# Patient Record
Sex: Female | Born: 1975
Health system: Southern US, Community
[De-identification: ages and names within clinical notes are randomized; demographics above are authoritative.]

## PROBLEM LIST (undated history)

## (undated) DIAGNOSIS — I890 Lymphedema, not elsewhere classified: Secondary | ICD-10-CM

## (undated) DIAGNOSIS — I10 Essential (primary) hypertension: Secondary | ICD-10-CM

## (undated) DIAGNOSIS — G43909 Migraine, unspecified, not intractable, without status migrainosus: Secondary | ICD-10-CM

## (undated) DIAGNOSIS — I82409 Acute embolism and thrombosis of unspecified deep veins of unspecified lower extremity: Secondary | ICD-10-CM

## (undated) DIAGNOSIS — T7840XA Allergy, unspecified, initial encounter: Secondary | ICD-10-CM

## (undated) DIAGNOSIS — K219 Gastro-esophageal reflux disease without esophagitis: Secondary | ICD-10-CM

## (undated) DIAGNOSIS — K7689 Other specified diseases of liver: Secondary | ICD-10-CM

## (undated) DIAGNOSIS — I2699 Other pulmonary embolism without acute cor pulmonale: Secondary | ICD-10-CM

## (undated) DIAGNOSIS — F419 Anxiety disorder, unspecified: Secondary | ICD-10-CM

## (undated) DIAGNOSIS — G4730 Sleep apnea, unspecified: Secondary | ICD-10-CM

## (undated) DIAGNOSIS — Z6841 Body Mass Index (BMI) 40.0 and over, adult: Secondary | ICD-10-CM

## (undated) DIAGNOSIS — F32A Depression, unspecified: Secondary | ICD-10-CM

## (undated) DIAGNOSIS — F329 Major depressive disorder, single episode, unspecified: Secondary | ICD-10-CM

## (undated) HISTORY — DX: Migraine, unspecified, not intractable, without status migrainosus: G43.909

## (undated) HISTORY — DX: Major depressive disorder, single episode, unspecified: F32.9

## (undated) HISTORY — DX: Allergy, unspecified, initial encounter: T78.40XA

## (undated) HISTORY — DX: Gastro-esophageal reflux disease without esophagitis: K21.9

## (undated) HISTORY — DX: Sleep apnea, unspecified: G47.30

## (undated) HISTORY — DX: Essential (primary) hypertension: I10

## (undated) HISTORY — DX: Anxiety disorder, unspecified: F41.9

## (undated) HISTORY — PX: CHOLECYSTECTOMY: SHX55

## (undated) HISTORY — DX: Depression, unspecified: F32.A

---

## 1998-08-08 ENCOUNTER — Encounter (HOSPITAL_COMMUNITY): Admission: RE | Admit: 1998-08-08 | Discharge: 1998-09-08 | Payer: Self-pay | Admitting: Obstetrics and Gynecology

## 1998-08-10 ENCOUNTER — Ambulatory Visit (HOSPITAL_COMMUNITY): Admission: RE | Admit: 1998-08-10 | Discharge: 1998-08-10 | Payer: Self-pay | Admitting: Obstetrics and Gynecology

## 1998-08-11 ENCOUNTER — Encounter: Payer: Self-pay | Admitting: Obstetrics and Gynecology

## 1998-08-15 ENCOUNTER — Ambulatory Visit (HOSPITAL_COMMUNITY): Admission: RE | Admit: 1998-08-15 | Discharge: 1998-08-15 | Payer: Self-pay | Admitting: Obstetrics and Gynecology

## 1998-09-06 ENCOUNTER — Inpatient Hospital Stay (HOSPITAL_COMMUNITY): Admission: AD | Admit: 1998-09-06 | Discharge: 1998-09-09 | Payer: Self-pay | Admitting: Obstetrics and Gynecology

## 1998-10-16 ENCOUNTER — Emergency Department (HOSPITAL_COMMUNITY): Admission: EM | Admit: 1998-10-16 | Discharge: 1998-10-16 | Payer: Self-pay | Admitting: Emergency Medicine

## 1998-11-26 ENCOUNTER — Inpatient Hospital Stay (HOSPITAL_COMMUNITY): Admission: EM | Admit: 1998-11-26 | Discharge: 1998-11-28 | Payer: Self-pay | Admitting: Emergency Medicine

## 1998-11-26 ENCOUNTER — Encounter: Payer: Self-pay | Admitting: Emergency Medicine

## 2000-05-15 ENCOUNTER — Other Ambulatory Visit: Admission: RE | Admit: 2000-05-15 | Discharge: 2000-05-15 | Payer: Self-pay | Admitting: Obstetrics and Gynecology

## 2001-07-17 ENCOUNTER — Other Ambulatory Visit: Admission: RE | Admit: 2001-07-17 | Discharge: 2001-07-17 | Payer: Self-pay | Admitting: Obstetrics and Gynecology

## 2002-07-22 ENCOUNTER — Other Ambulatory Visit: Admission: RE | Admit: 2002-07-22 | Discharge: 2002-07-22 | Payer: Self-pay | Admitting: Obstetrics and Gynecology

## 2012-11-23 LAB — HM PAP SMEAR: HM Pap smear: NEGATIVE

## 2013-01-21 ENCOUNTER — Encounter: Payer: Self-pay | Admitting: Obstetrics & Gynecology

## 2013-02-10 ENCOUNTER — Encounter: Payer: PRIVATE HEALTH INSURANCE | Admitting: Obstetrics & Gynecology

## 2013-02-24 ENCOUNTER — Encounter: Payer: PRIVATE HEALTH INSURANCE | Admitting: Obstetrics & Gynecology

## 2013-03-10 ENCOUNTER — Ambulatory Visit (INDEPENDENT_AMBULATORY_CARE_PROVIDER_SITE_OTHER): Payer: BC Managed Care – PPO | Admitting: Obstetrics & Gynecology

## 2013-03-10 ENCOUNTER — Encounter: Payer: Self-pay | Admitting: Obstetrics & Gynecology

## 2013-03-10 VITALS — BP 146/86 | HR 93 | Temp 97.1°F | Ht 64.0 in | Wt 369.2 lb

## 2013-03-10 DIAGNOSIS — Z975 Presence of (intrauterine) contraceptive device: Secondary | ICD-10-CM | POA: Insufficient documentation

## 2013-03-10 LAB — POCT PREGNANCY, URINE: Preg Test, Ur: NEGATIVE

## 2013-03-10 NOTE — Progress Notes (Signed)
Patient desires paragard removal and insertion; paragard expired in November 2013. Has had unprotected sex in last 2 weeks

## 2013-03-10 NOTE — Patient Instructions (Addendum)
Levonorgestrel intrauterine device (IUD) What is this medicine? LEVONORGESTREL IUD (LEE voe nor jes trel) is a contraceptive (birth control) device. The device is placed inside the uterus by a healthcare professional. It is used to prevent pregnancy and can also be used to treat heavy bleeding that occurs during your period. Depending on the device, it can be used for 3 to 5 years. This medicine may be used for other purposes; ask your health care provider or pharmacist if you have questions. What should I tell my health care provider before I take this medicine? They need to know if you have any of these conditions: -abnormal Pap smear -cancer of the breast, uterus, or cervix -diabetes -endometritis -genital or pelvic infection now or in the past -have more than one sexual partner or your partner has more than one partner -heart disease -history of an ectopic or tubal pregnancy -immune system problems -IUD in place -liver disease or tumor -problems with blood clots or take blood-thinners -use intravenous drugs -uterus of unusual shape -vaginal bleeding that has not been explained -an unusual or allergic reaction to levonorgestrel, other hormones, silicone, or polyethylene, medicines, foods, dyes, or preservatives -pregnant or trying to get pregnant -breast-feeding How should I use this medicine? This device is placed inside the uterus by a health care professional. Talk to your pediatrician regarding the use of this medicine in children. Special care may be needed. Overdosage: If you think you have taken too much of this medicine contact a poison control center or emergency room at once. NOTE: This medicine is only for you. Do not share this medicine with others. What if I miss a dose? This does not apply. What may interact with this medicine? Do not take this medicine with any of the following medications: -amprenavir -bosentan -fosamprenavir This medicine may also interact with  the following medications: -aprepitant -barbiturate medicines for inducing sleep or treating seizures -bexarotene -griseofulvin -medicines to treat seizures like carbamazepine, ethotoin, felbamate, oxcarbazepine, phenytoin, topiramate -modafinil -pioglitazone -rifabutin -rifampin -rifapentine -some medicines to treat HIV infection like atazanavir, indinavir, lopinavir, nelfinavir, tipranavir, ritonavir -St. John's wort -warfarin This list may not describe all possible interactions. Give your health care provider a list of all the medicines, herbs, non-prescription drugs, or dietary supplements you use. Also tell them if you smoke, drink alcohol, or use illegal drugs. Some items may interact with your medicine. What should I watch for while using this medicine? Visit your doctor or health care professional for regular check ups. See your doctor if you or your partner has sexual contact with others, becomes HIV positive, or gets a sexual transmitted disease. This product does not protect you against HIV infection (AIDS) or other sexually transmitted diseases. You can check the placement of the IUD yourself by reaching up to the top of your vagina with clean fingers to feel the threads. Do not pull on the threads. It is a good habit to check placement after each menstrual period. Call your doctor right away if you feel more of the IUD than just the threads or if you cannot feel the threads at all. The IUD may come out by itself. You may become pregnant if the device comes out. If you notice that the IUD has come out use a backup birth control method like condoms and call your health care provider. Using tampons will not change the position of the IUD and are okay to use during your period. What side effects may I notice from receiving this medicine?   Side effects that you should report to your doctor or health care professional as soon as possible: -allergic reactions like skin rash, itching or  hives, swelling of the face, lips, or tongue -fever, flu-like symptoms -genital sores -high blood pressure -no menstrual period for 6 weeks during use -pain, swelling, warmth in the leg -pelvic pain or tenderness -severe or sudden headache -signs of pregnancy -stomach cramping -sudden shortness of breath -trouble with balance, talking, or walking -unusual vaginal bleeding, discharge -yellowing of the eyes or skin Side effects that usually do not require medical attention (report to your doctor or health care professional if they continue or are bothersome): -acne -breast pain -change in sex drive or performance -changes in weight -cramping, dizziness, or faintness while the device is being inserted -headache -irregular menstrual bleeding within first 3 to 6 months of use -nausea This list may not describe all possible side effects. Call your doctor for medical advice about side effects. You may report side effects to FDA at 1-800-FDA-1088. Where should I keep my medicine? This does not apply. NOTE: This sheet is a summary. It may not cover all possible information. If you have questions about this medicine, talk to your doctor, pharmacist, or health care provider.  2013, [employer]. (01/09/2012 1:54:04 PM)  

## 2013-03-10 NOTE — Progress Notes (Signed)
Patient ID: Susan Warren, female   DOB: 21-Oct-1976, 37 y.o.   MRN: [id number]  Chief Complaint  Patient presents with  . IUD removal  . IUD insertion    HPI Susan Warren is a 37 y.o. female.  Paragard placed 2003, in amenorrheic. Wants removal and reinsertion.Was denied at [facility] due to no insurance.  HPI  Past Medical History  Diagnosis Date  . Hypertension     Past Surgical History  Procedure Laterality Date  . Cholecystectomy      Family History  Problem Relation Age of Onset  . Hypertension Maternal Grandmother   . Diabetes Maternal Grandmother   . Cancer Maternal Grandmother     Social History History  Substance Use Topics  . Smoking status: Never Smoker   . Smokeless tobacco: Never Used  . Alcohol Use: No    No Known Allergies  Current Outpatient Prescriptions  Medication Sig Dispense Refill  . buPROPion (WELLBUTRIN) 100 MG tablet Take 100 mg by mouth 2 (two) times daily.      . folic acid (FOLVITE) 800 MCG tablet Take 400 mcg by mouth daily.      Marland Kitchen lisinopril-hydrochlorothiazide (PRINZIDE,ZESTORETIC) 20-25 MG per tablet Take 1 tablet by mouth daily.       No current facility-administered medications for this visit.    Review of Systems Review of Systems  Genitourinary: Negative for vaginal discharge, menstrual problem (amenorrhea) and pelvic pain.    Blood pressure 146/86, pulse 93, temperature 97.1 F (36.2 C), temperature source Oral, height 5\' 4"  (1.626 m), weight 369 lb 3.2 oz (167.468 kg).  Physical Exam Physical Exam  Data Reviewed notes  Assessment    Needs IUD     Plan    Apply for Mirena   Hally Colella 03/10/2013         Tysheem Accardo 03/10/2013, 5:11 PM

## 2013-11-29 ENCOUNTER — Encounter: Payer: Self-pay | Admitting: Nurse Practitioner

## 2013-11-30 ENCOUNTER — Ambulatory Visit: Payer: Self-pay | Admitting: Nurse Practitioner

## 2013-11-30 ENCOUNTER — Encounter: Payer: Self-pay | Admitting: Nurse Practitioner

## 2014-01-25 ENCOUNTER — Ambulatory Visit (INDEPENDENT_AMBULATORY_CARE_PROVIDER_SITE_OTHER): Payer: BC Managed Care – PPO | Admitting: Internal Medicine

## 2014-01-25 ENCOUNTER — Encounter: Payer: Self-pay | Admitting: Internal Medicine

## 2014-01-25 ENCOUNTER — Telehealth: Payer: Self-pay | Admitting: Internal Medicine

## 2014-01-25 ENCOUNTER — Other Ambulatory Visit (INDEPENDENT_AMBULATORY_CARE_PROVIDER_SITE_OTHER): Payer: BC Managed Care – PPO

## 2014-01-25 VITALS — BP 118/80 | HR 89 | Temp 97.4°F | Ht 64.0 in | Wt 388.1 lb

## 2014-01-25 DIAGNOSIS — Z975 Presence of (intrauterine) contraceptive device: Secondary | ICD-10-CM

## 2014-01-25 DIAGNOSIS — F411 Generalized anxiety disorder: Secondary | ICD-10-CM

## 2014-01-25 DIAGNOSIS — Z0000 Encounter for general adult medical examination without abnormal findings: Secondary | ICD-10-CM

## 2014-01-25 DIAGNOSIS — T7840XA Allergy, unspecified, initial encounter: Secondary | ICD-10-CM | POA: Insufficient documentation

## 2014-01-25 DIAGNOSIS — F32A Depression, unspecified: Secondary | ICD-10-CM

## 2014-01-25 DIAGNOSIS — I10 Essential (primary) hypertension: Secondary | ICD-10-CM | POA: Insufficient documentation

## 2014-01-25 DIAGNOSIS — K219 Gastro-esophageal reflux disease without esophagitis: Secondary | ICD-10-CM | POA: Insufficient documentation

## 2014-01-25 DIAGNOSIS — G43909 Migraine, unspecified, not intractable, without status migrainosus: Secondary | ICD-10-CM | POA: Insufficient documentation

## 2014-01-25 DIAGNOSIS — F419 Anxiety disorder, unspecified: Secondary | ICD-10-CM | POA: Insufficient documentation

## 2014-01-25 DIAGNOSIS — F3289 Other specified depressive episodes: Secondary | ICD-10-CM

## 2014-01-25 DIAGNOSIS — F329 Major depressive disorder, single episode, unspecified: Secondary | ICD-10-CM

## 2014-01-25 LAB — TSH: TSH: 1.71 u[IU]/mL (ref 0.35–5.50)

## 2014-01-25 LAB — URINALYSIS, ROUTINE W REFLEX MICROSCOPIC
Bilirubin Urine: NEGATIVE
KETONES UR: NEGATIVE
NITRITE: NEGATIVE
PH: 5.5 (ref 5.0–8.0)
SPECIFIC GRAVITY, URINE: 1.025 (ref 1.000–1.030)
Total Protein, Urine: NEGATIVE
UROBILINOGEN UA: 0.2 (ref 0.0–1.0)
Urine Glucose: NEGATIVE

## 2014-01-25 LAB — LIPID PANEL
Cholesterol: 233 mg/dL — ABNORMAL HIGH (ref 0–200)
HDL: 44.2 mg/dL (ref >39.00)
TRIGLYCERIDES: 113 mg/dL (ref 0.0–149.0)
Total CHOL/HDL Ratio: 5
VLDL: 22.6 mg/dL (ref 0.0–40.0)

## 2014-01-25 LAB — BASIC METABOLIC PANEL
BUN: 13 mg/dL (ref 6–23)
CO2: 27 meq/L (ref 19–32)
Calcium: 9.8 mg/dL (ref 8.4–10.5)
Chloride: 102 mEq/L (ref 96–112)
Creatinine, Ser: 0.8 mg/dL (ref 0.4–1.2)
GFR: 84.43 mL/min (ref >60.00)
GLUCOSE: 84 mg/dL (ref 70–99)
POTASSIUM: 4.4 meq/L (ref 3.5–5.1)
SODIUM: 138 meq/L (ref 135–145)

## 2014-01-25 LAB — HEPATIC FUNCTION PANEL
ALK PHOS: 70 U/L (ref 39–117)
ALT: 21 U/L (ref 0–35)
AST: 22 U/L (ref 0–37)
Albumin: 3.8 g/dL (ref 3.5–5.2)
Bilirubin, Direct: 0.2 mg/dL (ref 0.0–0.3)
TOTAL PROTEIN: 7.9 g/dL (ref 6.0–8.3)
Total Bilirubin: 1.3 mg/dL — ABNORMAL HIGH (ref 0.3–1.2)

## 2014-01-25 LAB — CBC WITH DIFFERENTIAL/PLATELET
BASOS ABS: 0.1 10*3/uL (ref 0.0–0.1)
Basophils Relative: 0.6 % (ref 0.0–3.0)
EOS PCT: 1.8 % (ref 0.0–5.0)
Eosinophils Absolute: 0.2 10*3/uL (ref 0.0–0.7)
HEMATOCRIT: 42.5 % (ref 36.0–46.0)
Hemoglobin: 14.2 g/dL (ref 12.0–15.0)
LYMPHS ABS: 2.5 10*3/uL (ref 0.7–4.0)
LYMPHS PCT: 23.2 % (ref 12.0–46.0)
MCHC: 33.4 g/dL (ref 30.0–36.0)
MCV: 89.6 fl (ref 78.0–100.0)
Monocytes Absolute: 0.6 10*3/uL (ref 0.1–1.0)
Monocytes Relative: 5.4 % (ref 3.0–12.0)
NEUTROS PCT: 69 % (ref 43.0–77.0)
Neutro Abs: 7.4 10*3/uL (ref 1.4–7.7)
PLATELETS: 332 10*3/uL (ref 150.0–400.0)
RBC: 4.74 Mil/uL (ref 3.87–5.11)
RDW: 13.6 % (ref 11.5–14.6)
WBC: 10.7 10*3/uL — AB (ref 4.5–10.5)

## 2014-01-25 LAB — LDL CHOLESTEROL, DIRECT: Direct LDL: 177.9 mg/dL

## 2014-01-25 MED ORDER — ESCITALOPRAM OXALATE 10 MG PO TABS: 10.0000 mg | ORAL_TABLET | Freq: Every day | ORAL | Status: DC

## 2014-01-25 MED ORDER — LISINOPRIL-HYDROCHLOROTHIAZIDE 20-25 MG PO TABS: 1.0000 | ORAL_TABLET | Freq: Every day | ORAL | Status: DC

## 2014-01-25 MED ORDER — CEPHALEXIN 500 MG PO CAPS: 500.0000 mg | ORAL_CAPSULE | Freq: Four times a day (QID) | ORAL | Status: DC

## 2014-01-25 NOTE — Telephone Encounter (Signed)
Patient informed. 

## 2014-01-25 NOTE — Telephone Encounter (Signed)
UA with labs today with pyuria, pt called per Robin/CMA - pt with UTI symptoms - for cephalexin course,  to f/u any worsening symptoms or concerns

## 2014-01-25 NOTE — Patient Instructions (Signed)
Please take all new medication as prescribed - the lexapro (generic) at 10 mg per day  Please call or return if not improved in 4 wks, to consider higher strength  You will be contacted regarding the referral for: counseling  Please continue all other medications as before, and refills have been done if requested. Please have the pharmacy call with any other refills you may need.  Please continue your efforts at being more active, low cholesterol diet, and weight control. You are otherwise up to date with prevention measures today.  Please go to the LAB in the Basement (turn left off the elevator) for the tests to be done today You will be contacted by phone if any changes need to be made immediately.  Otherwise, you will receive a letter about your results with an explanation, but please check with MyChart first.  Please remember to sign up for My Chart if you have not done so, as this will be important to you in the future with finding out test results, communicating by private email, and scheduling acute appointments online when needed.  Please return in 1 year for your yearly visit, or sooner if needed, with Lab testing done 3-5 days before

## 2014-01-25 NOTE — Assessment & Plan Note (Signed)
For lexapro 10 qd, refer counseling, f/u if not improved in 4 wks

## 2014-01-25 NOTE — Assessment & Plan Note (Signed)

## 2014-01-25 NOTE — Progress Notes (Signed)
Subjective:    Patient ID: Susan Warren, female    DOB: [birth year], 38 y.o.   MRN: [id number]  HPI  Here for wellness and f/u;  Overall doing ok;  Pt denies CP, worsening SOB, DOE, wheezing, orthopnea, PND, worsening LE edema, palpitations, dizziness or syncope.  Pt denies neurological change such as new headache, facial or extremity weakness.  Pt denies polydipsia, polyuria, or low sugar symptoms. Pt states overall good compliance with treatment and medications, good tolerability, and has been trying to follow lower cholesterol diet.  Pt denies worsening depressive symptoms, suicidal ideation or panic. No fever, night sweats, wt loss, loss of appetite, or other constitutional symptoms.  Pt states good ability with ADL's, has low fall risk, home safety reviewed and adequate, no other significant changes in hearing or vision, and only occasionally active with exercise, though plans to start going 3 times per wk to gym soon..  Has significant anxiety, wellbutrin did not help, has teen age son with daily stress with him and work;  Denies signifcant OSA symtpoms,  Mother has signficant thyroid abnormal fxn Past Medical History  Diagnosis Date  . Hypertension   . Depression   . GERD (gastroesophageal reflux disease)   . Allergy   . Migraines   . Anxiety    Past Surgical History  Procedure Laterality Date  . Cholecystectomy      reports that she has never smoked. She has never used smokeless tobacco. She reports that she does not drink alcohol or use illicit drugs. family history includes Breast cancer in her maternal grandmother; Cancer in her maternal grandmother; Diabetes in her maternal grandmother; Hypertension in her maternal grandmother and mother; Stroke in her maternal grandmother. No Known Allergies Current Outpatient Prescriptions on File Prior to Visit  Medication Sig Dispense Refill  . folic acid (FOLVITE) 800 MCG tablet Take 400 mcg by mouth daily.       No current  facility-administered medications on file prior to visit.   Review of Systems Constitutional: Negative for diaphoresis, activity change, appetite change or unexpected weight change.  HENT: Negative for hearing loss, ear pain, facial swelling, mouth sores and neck stiffness.   Eyes: Negative for pain, redness and visual disturbance.  Respiratory: Negative for shortness of breath and wheezing.   Cardiovascular: Negative for chest pain and palpitations.  Gastrointestinal: Negative for diarrhea, blood in stool, abdominal distention or other pain Genitourinary: Negative for hematuria, flank pain or change in urine volume.  Musculoskeletal: Negative for myalgias and joint swelling.  Skin: Negative for color change and wound.  Neurological: Negative for syncope and numbness. other than noted Hematological: Negative for adenopathy.  Psychiatric/Behavioral: Negative for hallucinations, self-injury, decreased concentration and agitation.      Objective:   Physical Exam BP 118/80  Pulse 89  Temp(Src) 97.4 F (36.3 C) (Oral)  Ht 5\' 4"  (1.626 m)  Wt 388 lb 2 oz (176.052 kg)  BMI 66.59 kg/m2  SpO2 97% VS noted,  Constitutional: Pt is oriented to person, place, and time. Appears well-developed and well-nourished. .morbid obese Head: Normocephalic and atraumatic.  Right Ear: External ear normal.  Left Ear: External ear normal.  Nose: Nose normal.  Mouth/Throat: Oropharynx is clear and moist.  Eyes: Conjunctivae and EOM are normal. Pupils are equal, round, and reactive to light.  Neck: Normal range of motion. Neck supple. No JVD present. No tracheal deviation present.  Cardiovascular: Normal rate, regular rhythm, normal heart sounds and intact distal pulses.   Pulmonary/Chest: Effort normal  and breath sounds normal.  Abdominal: Soft. Bowel sounds are normal. There is no tenderness. No HSM  Musculoskeletal: Normal range of motion. Exhibits no edema.  Lymphadenopathy:  Has no cervical  adenopathy.  Neurological: Pt is alert and oriented to person, place, and time. Pt has normal reflexes. No cranial nerve deficit.  Skin: Skin is warm and dry. No rash noted.  Psychiatric:  Has  1-2+ anxious mood and affect. Behavior is normal.     Assessment & Plan:

## 2014-10-24 ENCOUNTER — Encounter: Payer: Self-pay | Admitting: Internal Medicine

## 2015-01-31 ENCOUNTER — Encounter: Payer: BC Managed Care – PPO | Admitting: Internal Medicine

## 2015-02-06 ENCOUNTER — Other Ambulatory Visit: Payer: Self-pay | Admitting: Internal Medicine

## 2015-04-07 ENCOUNTER — Encounter (HOSPITAL_COMMUNITY): Payer: Self-pay | Admitting: *Deleted

## 2015-04-07 ENCOUNTER — Observation Stay (HOSPITAL_COMMUNITY)
Admission: EM | Admit: 2015-04-07 | Discharge: 2015-04-07 | Disposition: A | Discharge status: Home / Self Care | Payer: Medicaid Other | Attending: Internal Medicine | Admitting: Internal Medicine

## 2015-04-07 DIAGNOSIS — E669 Obesity, unspecified: Secondary | ICD-10-CM | POA: Diagnosis not present

## 2015-04-07 DIAGNOSIS — Z7982 Long term (current) use of aspirin: Secondary | ICD-10-CM | POA: Diagnosis not present

## 2015-04-07 DIAGNOSIS — I10 Essential (primary) hypertension: Secondary | ICD-10-CM | POA: Diagnosis not present

## 2015-04-07 DIAGNOSIS — Z9049 Acquired absence of other specified parts of digestive tract: Secondary | ICD-10-CM | POA: Insufficient documentation

## 2015-04-07 DIAGNOSIS — R600 Localized edema: Secondary | ICD-10-CM

## 2015-04-07 DIAGNOSIS — T783XXA Angioneurotic edema, initial encounter: Secondary | ICD-10-CM | POA: Diagnosis not present

## 2015-04-07 DIAGNOSIS — K219 Gastro-esophageal reflux disease without esophagitis: Secondary | ICD-10-CM | POA: Insufficient documentation

## 2015-04-07 DIAGNOSIS — F329 Major depressive disorder, single episode, unspecified: Secondary | ICD-10-CM | POA: Diagnosis not present

## 2015-04-07 DIAGNOSIS — T783XXS Angioneurotic edema, sequela: Secondary | ICD-10-CM

## 2015-04-07 DIAGNOSIS — Z6841 Body Mass Index (BMI) 40.0 and over, adult: Secondary | ICD-10-CM | POA: Insufficient documentation

## 2015-04-07 DIAGNOSIS — M7989 Other specified soft tissue disorders: Secondary | ICD-10-CM | POA: Diagnosis not present

## 2015-04-07 DIAGNOSIS — G43909 Migraine, unspecified, not intractable, without status migrainosus: Secondary | ICD-10-CM | POA: Insufficient documentation

## 2015-04-07 DIAGNOSIS — G4730 Sleep apnea, unspecified: Secondary | ICD-10-CM

## 2015-04-07 DIAGNOSIS — K148 Other diseases of tongue: Secondary | ICD-10-CM | POA: Diagnosis present

## 2015-04-07 DIAGNOSIS — F419 Anxiety disorder, unspecified: Secondary | ICD-10-CM | POA: Insufficient documentation

## 2015-04-07 LAB — CBC WITH DIFFERENTIAL/PLATELET
Basophils Absolute: 0 10*3/uL (ref 0.0–0.1)
Basophils Relative: 0 % (ref 0–1)
EOS ABS: 0.1 10*3/uL (ref 0.0–0.7)
Eosinophils Relative: 1 % (ref 0–5)
HEMATOCRIT: 41.3 % (ref 36.0–46.0)
HEMOGLOBIN: 14 g/dL (ref 12.0–15.0)
LYMPHS ABS: 1.4 10*3/uL (ref 0.7–4.0)
Lymphocytes Relative: 12 % (ref 12–46)
MCH: 30.4 pg (ref 26.0–34.0)
MCHC: 33.9 g/dL (ref 30.0–36.0)
MCV: 89.6 fL (ref 78.0–100.0)
MONO ABS: 0.2 10*3/uL (ref 0.1–1.0)
MONOS PCT: 2 % — AB (ref 3–12)
NEUTROS PCT: 85 % — AB (ref 43–77)
Neutro Abs: 10.2 10*3/uL — ABNORMAL HIGH (ref 1.7–7.7)
Platelets: 310 10*3/uL (ref 150–400)
RBC: 4.61 MIL/uL (ref 3.87–5.11)
RDW: 13.3 % (ref 11.5–15.5)
WBC: 12 10*3/uL — ABNORMAL HIGH (ref 4.0–10.5)

## 2015-04-07 LAB — BASIC METABOLIC PANEL
ANION GAP: 10 (ref 5–15)
BUN: 23 mg/dL (ref 6–23)
CO2: 23 mmol/L (ref 19–32)
Calcium: 9.7 mg/dL (ref 8.4–10.5)
Chloride: 102 mmol/L (ref 96–112)
Creatinine, Ser: 1.19 mg/dL — ABNORMAL HIGH (ref 0.50–1.10)
GFR calc Af Amer: 65 mL/min — ABNORMAL LOW (ref >90)
GFR, EST NON AFRICAN AMERICAN: 56 mL/min — AB (ref >90)
GLUCOSE: 146 mg/dL — AB (ref 70–99)
Potassium: 4 mmol/L (ref 3.5–5.1)
SODIUM: 135 mmol/L (ref 135–145)

## 2015-04-07 MED ORDER — FAMOTIDINE IN NACL 20-0.9 MG/50ML-% IV SOLN
20.0000 mg | Freq: Two times a day (BID) | INTRAVENOUS | Status: DC
  Administered 2015-04-07: 20 mg via INTRAVENOUS
  Filled 2015-04-07 (×2): qty 50

## 2015-04-07 MED ORDER — ACETAMINOPHEN 325 MG PO TABS: 650.0000 mg | ORAL_TABLET | Freq: Four times a day (QID) | ORAL | Status: DC | PRN

## 2015-04-07 MED ORDER — HYDROCHLOROTHIAZIDE 25 MG PO TABS: 25.0000 mg | ORAL_TABLET | Freq: Every day | ORAL | Status: DC

## 2015-04-07 MED ORDER — FAMOTIDINE IN NACL 20-0.9 MG/50ML-% IV SOLN
20.0000 mg | Freq: Once | INTRAVENOUS | Status: AC
  Administered 2015-04-07: 20 mg via INTRAVENOUS

## 2015-04-07 MED ORDER — METHYLPREDNISOLONE 4 MG PO TBPK: ORAL_TABLET | ORAL | Status: DC

## 2015-04-07 MED ORDER — METHYLPREDNISOLONE SODIUM SUCC 125 MG IJ SOLR
60.0000 mg | Freq: Every day | INTRAMUSCULAR | Status: DC
  Administered 2015-04-07: 60 mg via INTRAVENOUS
  Filled 2015-04-07: qty 0.96

## 2015-04-07 MED ORDER — RANITIDINE HCL 150 MG PO TABS
150.0000 mg | ORAL_TABLET | Freq: Two times a day (BID) | ORAL | Status: DC
Start: 2015-04-07 — End: 2015-04-07

## 2015-04-07 MED ORDER — DIPHENHYDRAMINE HCL 25 MG PO CAPS: 25.0000 mg | ORAL_CAPSULE | Freq: Four times a day (QID) | ORAL | Status: DC | PRN

## 2015-04-07 MED ORDER — RANITIDINE HCL 150 MG PO TABS: 150.0000 mg | ORAL_TABLET | Freq: Two times a day (BID) | ORAL | Status: DC

## 2015-04-07 MED ORDER — ACETAMINOPHEN 650 MG RE SUPP: 650.0000 mg | Freq: Four times a day (QID) | RECTAL | Status: DC | PRN

## 2015-04-07 MED ORDER — DIPHENHYDRAMINE HCL 50 MG/ML IJ SOLN
25.0000 mg | Freq: Once | INTRAMUSCULAR | Status: AC
  Administered 2015-04-07: 25 mg via INTRAVENOUS

## 2015-04-07 MED ORDER — HYDRALAZINE HCL 20 MG/ML IJ SOLN: 10.0000 mg | INTRAMUSCULAR | Status: DC | PRN

## 2015-04-07 MED ORDER — ONDANSETRON HCL 4 MG PO TABS: 4.0000 mg | ORAL_TABLET | Freq: Four times a day (QID) | ORAL | Status: DC | PRN

## 2015-04-07 MED ORDER — SODIUM CHLORIDE 0.9 % IJ SOLN: 3.0000 mL | Freq: Two times a day (BID) | INTRAMUSCULAR | Status: DC

## 2015-04-07 MED ORDER — METHYLPREDNISOLONE SODIUM SUCC 125 MG IJ SOLR
125.0000 mg | Freq: Once | INTRAMUSCULAR | Status: AC
  Administered 2015-04-07: 125 mg via INTRAVENOUS

## 2015-04-07 MED ORDER — HEPARIN SODIUM (PORCINE) 5000 UNIT/ML IJ SOLN
5000.0000 [IU] | Freq: Three times a day (TID) | INTRAMUSCULAR | Status: DC
  Administered 2015-04-07 (×2): 5000 [IU] via SUBCUTANEOUS
  Filled 2015-04-07 (×2): qty 1

## 2015-04-07 MED ORDER — ONDANSETRON HCL 4 MG/2ML IJ SOLN: 4.0000 mg | Freq: Four times a day (QID) | INTRAMUSCULAR | Status: DC | PRN

## 2015-04-07 MED ORDER — HYDROCHLOROTHIAZIDE 25 MG PO TABS
25.0000 mg | ORAL_TABLET | Freq: Every day | ORAL | Status: DC
  Administered 2015-04-07: 25 mg via ORAL
  Filled 2015-04-07: qty 1

## 2015-04-07 MED ORDER — ESCITALOPRAM OXALATE 10 MG PO TABS
10.0000 mg | ORAL_TABLET | Freq: Every day | ORAL | Status: DC
  Administered 2015-04-07: 10 mg via ORAL
  Filled 2015-04-07: qty 1

## 2015-04-07 NOTE — ED Provider Notes (Signed)
CSN: [id number]     Arrival date & time 04/07/15  0048 History   First MD Initiated Contact with Patient 04/07/15 0058     Chief Complaint  Patient presents with  . Allergic Reaction     (Consider location/radiation/quality/duration/timing/severity/associated sxs/prior Treatment) HPI 39 year old female presents to emergency department from home with complaint of acute onset of tongue swelling.  Patient thinks she may have a reaction to an adhesive glue.  Patient reports that she was spraying the glue and a purse doing a project.  About 10 minutes later she noticed she had tingling of her tongue, followed by swelling.  She sees the glue in the past without difficulties.  Patient also has history of hypertension treated with lisinopril.  No rash, no swelling anywhere else.  Patient is able to swallow, and breathe normally.  She reports the swelling at times is seen to decrease but has stayed overall swollen.  No prior history of same. Past Medical History  Diagnosis Date  . Hypertension   . Depression   . GERD (gastroesophageal reflux disease)   . Allergy   . Migraines   . Anxiety    Past Surgical History  Procedure Laterality Date  . Cholecystectomy     Family History  Problem Relation Age of Onset  . Hypertension Maternal Grandmother   . Diabetes Maternal Grandmother   . Breast cancer Maternal Grandmother   . Cancer Maternal Grandmother     breast cancer  . Stroke Maternal Grandmother   . Hypertension Mother    History  Substance Use Topics  . Smoking status: Never Smoker   . Smokeless tobacco: Never Used  . Alcohol Use: No   OB History    Gravida Para Term Preterm AB TAB SAB Ectopic Multiple Living   1 1 1  0 0 0 0 0 0 1     Review of Systems   See History of Present Illness; otherwise all other systems are reviewed and negative  Allergies  Review of patient's allergies indicates no known allergies.  Home Medications   Prior to Admission medications    Medication Sig Start Date End Date Taking? Authorizing Provider  aspirin-acetaminophen-caffeine (EXCEDRIN MIGRAINE) [phone] MG per tablet Take 1 tablet by mouth every 6 (six) hours as needed for headache.   Yes Historical Provider, MD  escitalopram (LEXAPRO) 10 MG tablet Take 10 mg by mouth daily.   Yes Historical Provider, MD  ibuprofen (ADVIL,MOTRIN) 200 MG tablet Take 400 mg by mouth every 6 (six) hours as needed for moderate pain.   Yes Historical Provider, MD  lisinopril-hydrochlorothiazide (PRINZIDE,ZESTORETIC) 20-25 MG per tablet Take 1 tablet by mouth every morning.   Yes Historical Provider, MD  cephALEXin (KEFLEX) 500 MG capsule Take 1 capsule (500 mg total) by mouth 4 (four) times daily. Patient not taking: Reported on 04/07/2015 01/25/14   Corwin LevinsJames W John, MD  escitalopram (LEXAPRO) 10 MG tablet TAKE ONE TABLET BY MOUTH ONCE DAILY Patient not taking: Reported on 04/07/2015 02/07/15   Corwin LevinsJames W John, MD  lisinopril-hydrochlorothiazide (PRINZIDE,ZESTORETIC) 20-25 MG per tablet TAKE ONE TABLET BY MOUTH ONCE DAILY Patient not taking: Reported on 04/07/2015 02/07/15   Corwin LevinsJames W John, MD   BP 126/59 mmHg  Pulse 66  Temp(Src) 97.8 F (36.6 C) (Oral)  Resp 19  Ht 5\' 4"  (1.626 m)  Wt 390 lb (176.903 kg)  BMI 66.91 kg/m2  SpO2 88% Physical Exam  Constitutional: She is oriented to person, place, and time. She appears well-developed and well-nourished.  HENT:  Head: Normocephalic and atraumatic.  Nose: Nose normal.  Mouth/Throat: Oropharynx is clear and moist.  Patient with moderate angioedema of the tongue without obstruction of the airway.  She is able to swallow her own saliva, and speak with muffled voice.  Eyes: Conjunctivae and EOM are normal. Pupils are equal, round, and reactive to light.  Neck: Normal range of motion. Neck supple. No JVD present. No tracheal deviation present. No thyromegaly present.  Cardiovascular: Normal rate, regular rhythm, normal heart sounds and intact distal  pulses.  Exam reveals no gallop and no friction rub.   No murmur heard. Pulmonary/Chest: Effort normal and breath sounds normal. No stridor. No respiratory distress. She has no wheezes. She has no rales. She exhibits no tenderness.  Abdominal: Soft. Bowel sounds are normal. She exhibits no distension and no mass. There is no tenderness. There is no rebound and no guarding.  Musculoskeletal: Normal range of motion. She exhibits no edema or tenderness.  Lymphadenopathy:    She has no cervical adenopathy.  Neurological: She is alert and oriented to person, place, and time. She displays normal reflexes. She exhibits normal muscle tone. Coordination normal.  Skin: Skin is warm and dry. No rash noted. No erythema. No pallor.  Psychiatric: She has a normal mood and affect. Her behavior is normal. Judgment and thought content normal.  Nursing note and vitals reviewed.   ED Course  Procedures (including critical care time) Labs Review Labs Reviewed - No data to display  Imaging Review No results found.   EKG Interpretation None      MDM   Final diagnoses:  Angioedema, initial encounter  Sleep apnea    39 year old female with angioedema of undetermined trigger, may be secondary to the adhesive spray, but seems more likely to be ACE inhibitor related.  Patient to receive allergic medications, Solu-Medrol, Pepcid and Benadryl.  Patient will be monitored closely in her resuscitation bay for need for airway  Intervention.  3:54 AM Patient has had no worsening of her angioedema, but no significant improvement.  It appears that the patient has sleep apnea as well, as she is having brief apneic episodes while she sleeps.  She has not previously been evaluated or treated for this.  Will discuss with hospitalist for admission for further monitoring.      Marisa Severin, MD 04/07/15 [phone]

## 2015-04-07 NOTE — H&P (Signed)
[facility] History and Physical  Patient: Susan Warren  MRN: [id number]  DOB: 1976-03-21  DOS: the patient was seen and examined on 04/07/2015 PCP: Oliver BarreJames John, MD  Chief Complaint: Tongue swelling  HPI: Susan [name] is a 39 y.o. female with Past medical history of hypertension, GERD, anxiety. The patient is presenting with complaints of tongue swelling. She was working with a Glue  spray on a project and while using it she started developing swelling of her tongue which was progressively worsening and she also had some numbness of her tongue and therefore she came to the ER. She was initially treated in the ER and her symptoms were not improving and therefore she was referred for admission. At the time of my evaluation she mentions that the swelling of the tongue is improving but not acting her baseline and also she has some muffled voice which is also not her baseline. She mentions she is able to swallow her saliva. Does not complain of any dizziness lightheadedness chest pain and over breathing abdominal pain constipation diarrhea. She has been on lisinopril for more than 3 years. She has been using this Glue since last one month.no other new chemicals exposure.  The patient is coming from home. And at her baseline independent for most of her ADL.  Review of Systems: as mentioned in the history of present illness.  A comprehensive review of the other systems is negative.  Past Medical History  Diagnosis Date  . Hypertension   . Depression   . GERD (gastroesophageal reflux disease)   . Allergy   . Migraines   . Anxiety    Past Surgical History  Procedure Laterality Date  . Cholecystectomy     Social History:  reports that she has never smoked. She has never used smokeless tobacco. She reports that she does not drink alcohol or use illicit drugs.  No Known Allergies  Family History  Problem Relation Age of Onset  . Hypertension Maternal Grandmother   . Diabetes  Maternal Grandmother   . Breast cancer Maternal Grandmother   . Cancer Maternal Grandmother     breast cancer  . Stroke Maternal Grandmother   . Hypertension Mother     Prior to Admission medications   Medication Sig Start Date End Date Taking? Authorizing Provider  aspirin-acetaminophen-caffeine (EXCEDRIN MIGRAINE) 229 045 [id number] MG per tablet Take 1 tablet by mouth every 6 (six) hours as needed for headache.   Yes Historical Provider, MD  escitalopram (LEXAPRO) 10 MG tablet Take 10 mg by mouth daily.   Yes Historical Provider, MD  ibuprofen (ADVIL,MOTRIN) 200 MG tablet Take 400 mg by mouth every 6 (six) hours as needed for moderate pain.   Yes Historical Provider, MD  lisinopril-hydrochlorothiazide (PRINZIDE,ZESTORETIC) 20-25 MG per tablet Take 1 tablet by mouth every morning.   Yes Historical Provider, MD  cephALEXin (KEFLEX) 500 MG capsule Take 1 capsule (500 mg total) by mouth 4 (four) times daily. Patient not taking: Reported on 04/07/2015 01/25/14   Corwin LevinsJames W John, MD  escitalopram (LEXAPRO) 10 MG tablet TAKE ONE TABLET BY MOUTH ONCE DAILY Patient not taking: Reported on 04/07/2015 02/07/15   Corwin LevinsJames W John, MD  lisinopril-hydrochlorothiazide (PRINZIDE,ZESTORETIC) 20-25 MG per tablet TAKE ONE TABLET BY MOUTH ONCE DAILY Patient not taking: Reported on 04/07/2015 02/07/15   Corwin LevinsJames W John, MD    Physical Exam: Filed Vitals:   04/07/15 0345 04/07/15 0400 04/07/15 0500 04/07/15 0524  BP:  124/66 144/74 157/88  Pulse: 66 75 92 88  Temp:    98.2 F (36.8 C)  TempSrc:    Oral  Resp: Height:     (1.6 m)  Weight:    184.659 kg (407 lb 1.6 oz)  SpO2: 93% 100% 99% 97%    General: Alert, Awake and Oriented to Time, Place and Person. Appear in mild distress Eyes: PERRL ENT: Oral Mucosa clear swollen down moist. Neck: no stridor  JVD Cardiovascular: S1 and S2 Present, no Murmur, Peripheral Pulses Present Respiratory: Bilateral Air entry equal and Decreased,  Clear to Auscultation,  noCrackles, no wheezes Abdomen: Bowel Sound present, Soft and non tender Skin: no Rash Extremities: Bilateral  Pedal edema, no calf tenderness Neurologic: Grossly no focal neuro deficit.  Labs on Admission:  CBC:  Recent Labs Lab 04/07/15 0438  WBC 12.0*  NEUTROABS 10.2*  HGB 14.0  HCT 41.3  MCV 89.6  PLT 310    CMP     Component Value Date/Time   NA 135 04/07/2015 0438   K 4.0 04/07/2015 0438   CL 102 04/07/2015 0438   CO2 23 04/07/2015 0438   GLUCOSE 146* 04/07/2015 0438   BUN 23 04/07/2015 0438   CREATININE 1.19* 04/07/2015 0438   CALCIUM 9.7 04/07/2015 0438   PROT 7.9 01/25/2014 1049   ALBUMIN 3.8 01/25/2014 1049   AST 22 01/25/2014 1049   ALT 21 01/25/2014 1049   ALKPHOS 70 01/25/2014 1049   BILITOT 1.3* 01/25/2014 1049   GFRNONAA 56* 04/07/2015 0438   GFRAA 65* 04/07/2015 0438    No results for input(s): LIPASE, AMYLASE in the last 168 hours.  No results for input(s): CKTOTAL, CKMB, CKMBINDEX, TROPONINI in the last 168 hours. BNP (last 3 results) No results for input(s): BNP in the last 8760 hours.  ProBNP (last 3 results) No results for input(s): PROBNP in the last 8760 hours.   Radiological Exams on Admission: No results found.  Assessment/Plan Principal Problem:   Angioedema Active Problems:   Hypertension   1. Angioedema The patient is presenting with complaints of swelling of the tongue. She does not appear to have any stridor nor appears to have any hypoxia. With this she will be admitted in the hospital for observation. Continue with steroids and histamine blockers. Monitor on telemetry. Recommend her to avoid using the glue. May also choose lisinopril to be on hold. Currently stopping it  2. Essential hypertension. Blood pressure stable. Continue close monitoring.  3. Bilateral lower extremity swelling. Recommended patient to use compression stockings at home. Recommended her to elevate her legs.  4. Obesity. Possible  apnea. Recommended her to get further workup as an outpatient.  Advance goals of care discussion: Full code    DVT Prophylaxis: subcutaneous Heparin Nutrition: Cardiac diet liquid diet at present  Disposition: Admitted as observation, telemetry unit.  Author: Lynden Oxford, MD [facility] Pager: [phone] 04/07/2015  If 7PM-7AM, please contact night-coverage [email or web address] Password [email or web address]

## 2015-04-07 NOTE — Progress Notes (Signed)
UR completed 

## 2015-04-07 NOTE — ED Notes (Signed)
Pt reports she sprayed fabric spray on her purse, might have inhaled it.  15 minutes later she started to have swelling in her tongue and under her chin.  Pt reports pain as well.  Pt is also on lisinopril.

## 2015-04-07 NOTE — Discharge Summary (Signed)
Susan Warren, 39 y.o., DOB [birth year], MRN [id number]. Admission date: 04/07/2015 Discharge Date 04/07/2015 Primary MD Oliver Barre, MD Admitting Physician Rolly Salter, MD   PCP please follow-up on: - Patient had angioedema, her lisinopril was stopped, may need to be started on different antihypertensive medication if blood pressures are controlled.  Admission Diagnosis  Sleep apnea [G47.30] Angioedema, initial encounter [T78.3XXA]  Discharge Diagnosis   Principal Problem:   Angioedema Active Problems:   Hypertension     Past Medical History  Diagnosis Date  . Hypertension   . Depression   . GERD (gastroesophageal reflux disease)   . Allergy   . Migraines   . Anxiety     Past Surgical History  Procedure Laterality Date  . Cholecystectomy       Hospital Course See H&P, Labs, Consult and Test reports for all details in brief, patient was admitted for **  Principal Problem:   Angioedema Active Problems:   Hypertension  History of present illness by Dr. Lynden Oxford. Susan Warren is a 39 y.o. female with Past medical history of hypertension, GERD, anxiety. The patient is presenting with complaints of tongue swelling. She was working with a Glue spray on a project and while using it she started developing swelling of her tongue which was progressively worsening and she also had some numbness of her tongue and therefore she came to the ER. She was initially treated in the ER and her symptoms were not improving and therefore she was referred for admission. At the time of my evaluation she mentions that the swelling of the tongue is improving but not acting her baseline and also she has some muffled voice which is also not her baseline. She mentions she is able to swallow her saliva. Does not complain of any dizziness lightheadedness chest pain and over breathing abdominal pain constipation diarrhea. She has been on lisinopril for more than 3 years. She has been using  this Glue since last one month.no other new chemicals exposure.  The patient is coming from home. And at her baseline independent for Warren of her ADL.  Angioedema - Agent was started on IV Solu-Medrol, Zantac, Benadryl as needed, admitted for observation, no stridor, notices oral secretion, diet was advanced, limited lumbar lunch very well. - Patient was instructed to avoid using that glue, as well lisinopril was stopped, instructed not to use any lisinopril or any of ACE inhibitor or ARB class.  Hypertension - will be Be continued on hydrochlorothiazide.  Consults   none  Significant Tests:  See full reports for all details    No results found.   Today   Subjective:   [name] today has no headache,no chest abdominal pain,no new weakness tingling or numbness, feels much better wants to go home today.   Objective:   Blood pressure 153/71, pulse 85, temperature 98 F (36.7 C), temperature source Oral, resp. rate 20, height  (1.6 m), weight 184.659 kg (407 lb 1.6 oz), SpO2 94 %.  Intake/Output Summary (Last 24 hours) at 04/07/15 1708 Last data filed at 04/07/15 1215  Gross per 24 hour  Intake   1080 ml  Output      0 ml  Net   1080 ml    Exam Awake Alert, Oriented *3, No new F.N deficits, Normal affect [place].AT,PERRAL, normal tongue, no swelling no erythema, no erythema at the back of the throat, no excessive secretions. Supple Neck,No JVD, No cervical lymphadenopathy appriciated. No stridor Symmetrical Chest wall  movement, Good air movement bilaterally, CTAB RRR,No Gallops,Rubs or new Murmurs, No Parasternal Heave +ve B.Sounds, Abd Soft, Non tender, No organomegaly appriciated, No rebound -guarding or rigidity. No Cyanosis, Clubbing or edema, No new Rash or bruise  Data Review   Cultures -   CBC w Diff: Lab Results  Component Value Date   WBC 12.0* 04/07/2015   HGB 14.0 04/07/2015   HCT 41.3 04/07/2015   PLT 310 04/07/2015   LYMPHOPCT 12 04/07/2015    MONOPCT 2* 04/07/2015   EOSPCT 1 04/07/2015   BASOPCT 0 04/07/2015   CMP: Lab Results  Component Value Date   NA 135 04/07/2015   K 4.0 04/07/2015   CL 102 04/07/2015   CO2 23 04/07/2015   BUN 23 04/07/2015   CREATININE 1.19* 04/07/2015   PROT 7.9 01/25/2014   ALBUMIN 3.8 01/25/2014   BILITOT 1.3* 01/25/2014   ALKPHOS 70 01/25/2014   AST 22 01/25/2014   ALT 21 01/25/2014  .  Micro Results No results found for this or any previous visit (from the past 240 hour(s)).   Discharge Instructions      Follow-up Information    Follow up with Oliver BarreJames John, MD. Schedule an appointment as soon as possible for a visit in 1 week.   Specialties:  Internal Medicine, Radiology   Why:  posthospitalization follow up.   Contact information:   [address] [place] [address] [phone]       Discharge Medications     Medication List    STOP taking these medications        cephALEXin 500 MG capsule  Commonly known as:  KEFLEX     lisinopril-hydrochlorothiazide 20-25 MG per tablet  Commonly known as:  PRINZIDE,ZESTORETIC      TAKE these medications        aspirin-acetaminophen-caffeine 250-250-65 MG per tablet  Commonly known as:  EXCEDRIN MIGRAINE  Take 1 tablet by mouth every 6 (six) hours as needed for headache.     escitalopram 10 MG tablet  Commonly known as:  LEXAPRO  Take 10 mg by mouth daily.     hydrochlorothiazide 25 MG tablet  Commonly known as:  HYDRODIURIL  Take 1 tablet (25 mg total) by mouth daily.     ibuprofen 200 MG tablet  Commonly known as:  ADVIL,MOTRIN  Take 400 mg by mouth every 6 (six) hours as needed for moderate pain.     methylPREDNISolone 4 MG Tbpk tablet  Commonly known as:  MEDROL DOSEPAK  Please use as instructed, 5 tablets on day 1, 4 tablets on day 2, 3 tablets on day 3, 2 tablets on day 4, 1 tablet Sunday 5, then stop.     ranitidine 150 MG tablet  Commonly known as:  ZANTAC  Take 1 tablet (150 mg total) by mouth 2  (two) times daily.         Total Time in preparing paper work, data evaluation and todays exam - 35 minutes  ELGERGAWY, DAWOOD M.D on 04/07/2015 at 5:08 PM  [facility] Office  [phone]

## 2015-04-07 NOTE — ED Notes (Signed)
Patient asleep-O2 sat decreased to 82%-in to assess patient-noted patient having sleep apnea-patient awakened self with a loud snore and O2 sat increased to 99%.  Dr. Norlene Campbelltter made aware.  O2 applied at 4l/[place]-remains on monitor and continuous pulse ox.  Patient continues to have moderate swelling of tongue with muffled speech, maintaining own secretions and no distress noted.

## 2015-04-07 NOTE — Discharge Instructions (Signed)
Follow with Primary MD Oliver BarreJames John, MD in 7 days  Please do not take Lisinopril or any other medication on that class, called ACE inhibitors, or ARB Get CBC, CMP, 2 view Chest X ray checked  by Primary MD next visit.   Activity: As tolerated with Full fall precautions use walker/cane & assistance as needed   Disposition Home    Diet: Heart Healthy , with feeding assistance and aspiration precautions.  For Heart failure patients - Check your Weight same time everyday, if you gain over 2 pounds, or you develop in leg swelling, experience more shortness of breath or chest pain, call your Primary MD immediately. Follow Cardiac Low Salt Diet and 1.5 lit/day fluid restriction.   On your next visit with your primary care physician please Get Medicines reviewed and adjusted.   Please request your Prim.MD to go over all Hospital Tests and Procedure/Radiological results at the follow up, please get all Hospital records sent to your Prim MD by signing hospital release before you go home.   If you experience worsening of your admission symptoms, develop shortness of breath, life threatening emergency, suicidal or homicidal thoughts you must seek medical attention immediately by calling 911 or calling your MD immediately  if symptoms less severe.  You Must read complete instructions/literature along with all the possible adverse reactions/side effects for all the Medicines you take and that have been prescribed to you. Take any new Medicines after you have completely understood and accpet all the possible adverse reactions/side effects.   Do not drive, operating heavy machinery, perform activities at heights, swimming or participation in water activities or provide baby sitting services if your were admitted for syncope or siezures until you have seen by Primary MD or a Neurologist and advised to do so again.  Do not drive when taking Pain medications.    Do not take more than prescribed Pain, Sleep  and Anxiety Medications  Special Instructions: If you have smoked or chewed Tobacco  in the last 2 yrs please stop smoking, stop any regular Alcohol  and or any Recreational drug use.  Wear Seat belts while driving.   Please note  You were cared for by a hospitalist during your hospital stay. If you have any questions about your discharge medications or the care you received while you were in the hospital after you are discharged, you can call the unit and asked to speak with the hospitalist on call if the hospitalist that took care of you is not available. Once you are discharged, your primary care physician will handle any further medical issues. Please note that NO REFILLS for any discharge medications will be authorized once you are discharged, as it is imperative that you return to your primary care physician (or establish a relationship with a primary care physician if you do not have one) for your aftercare needs so that they can reassess your need for medications and monitor your lab values.

## 2015-04-07 NOTE — Care Management Note (Addendum)
    Page 1 of 1   04/07/2015     3:20:49 PM CARE MANAGEMENT NOTE 04/07/2015  Patient:  Susan Warren,Susan Warren   Account Number:  [id number]  Date Initiated:  04/07/2015  Documentation initiated by:  Lanier ClamMAHABIR,Yao [place]  Subjective/Objective Assessment:   39 y/o f admitted w/Angioedema.     Action/Plan:   From home.   Anticipated DC Date:  04/07/2015   Anticipated DC Plan:  HOME/SELF CARE      DC Planning Services  CM consult      Choice offered to / List presented to:             Status of service:  Completed, signed off Medicare Important Message given?   (If response is "NO", the following Medicare IM given date fields will be blank) Date Medicare IM given:   Medicare IM given by:   Date Additional Medicare IM given:   Additional Medicare IM given by:    Discharge Disposition:  HOME/SELF CARE  Per UR Regulation:  Reviewed for med. necessity/level of care/duration of stay  If discussed at Long Length of Stay Meetings, dates discussed:    Comments:  04/07/15 Lanier ClamKathy Aarion Kittrell RN BSN NCM [phone] No anticipated d/c needs.

## 2015-04-12 ENCOUNTER — Encounter: Payer: Self-pay | Admitting: Nurse Practitioner

## 2015-04-21 ENCOUNTER — Emergency Department (HOSPITAL_COMMUNITY)
Admission: EM | Admit: 2015-04-21 | Discharge: 2015-04-21 | Disposition: A | Discharge status: Home / Self Care | Payer: Medicaid Other | Attending: Emergency Medicine | Admitting: Emergency Medicine

## 2015-04-21 ENCOUNTER — Encounter (HOSPITAL_COMMUNITY): Payer: Self-pay | Admitting: Emergency Medicine

## 2015-04-21 DIAGNOSIS — G43909 Migraine, unspecified, not intractable, without status migrainosus: Secondary | ICD-10-CM | POA: Diagnosis not present

## 2015-04-21 DIAGNOSIS — R000 Tachycardia, unspecified: Secondary | ICD-10-CM | POA: Insufficient documentation

## 2015-04-21 DIAGNOSIS — F329 Major depressive disorder, single episode, unspecified: Secondary | ICD-10-CM | POA: Diagnosis not present

## 2015-04-21 DIAGNOSIS — R0981 Nasal congestion: Secondary | ICD-10-CM

## 2015-04-21 DIAGNOSIS — I10 Essential (primary) hypertension: Secondary | ICD-10-CM | POA: Diagnosis not present

## 2015-04-21 DIAGNOSIS — Z79899 Other long term (current) drug therapy: Secondary | ICD-10-CM | POA: Diagnosis not present

## 2015-04-21 DIAGNOSIS — J040 Acute laryngitis: Secondary | ICD-10-CM

## 2015-04-21 DIAGNOSIS — K219 Gastro-esophageal reflux disease without esophagitis: Secondary | ICD-10-CM | POA: Diagnosis not present

## 2015-04-21 DIAGNOSIS — F419 Anxiety disorder, unspecified: Secondary | ICD-10-CM | POA: Diagnosis not present

## 2015-04-21 MED ORDER — FLUTICASONE PROPIONATE 50 MCG/ACT NA SUSP: 2.0000 | Freq: Every day | NASAL | Status: DC

## 2015-04-21 MED ORDER — METHYLPREDNISOLONE 4 MG PO TBPK: ORAL_TABLET | ORAL | Status: DC

## 2015-04-21 NOTE — Discharge Instructions (Signed)
Laryngitis Laryngitis is redness, soreness, and puffiness (inflammation) of the vocal cords. It causes hoarseness, cough, loss of voice, sore throat, and dry throat. It may be caused by:  Infection.  Too much smoking.  Too much talking or yelling.  Breathing in of toxic fumes.  Allergies.  A backup of acid from your stomach. HOME CARE  Drink enough fluids to keep your pee (urine) clear or pale yellow.  Rest until you no longer have problems or as told by your doctor.  Breathe in moist air.  Take all medicine as told by your doctor.  Do not smoke.  Talk as little as possible (this includes whispering).  Write on paper instead of talking until your voice is back to normal.  Follow up with your doctor if you have not improved after 10 days. GET HELP IF:   You have trouble breathing.  You cough up blood.  You have a fever that will not go away.  You have increasing pain.  You have trouble swallowing. MAKE SURE YOU:  Understand these instructions.  Will watch your condition.  Will get help right away if you are not doing well or get worse. Document Released: 11/28/2011 Document Revised: 03/02/2012 Document Reviewed: 11/28/2011 [facility] Patient Information 2015 [place] [place]. This information is not intended to replace advice given to you by your health care provider. Make sure you discuss any questions you have with your health care provider. Allergic Rhinitis Allergic rhinitis is when the mucous membranes in the nose respond to allergens. Allergens are particles in the air that cause your body to have an allergic reaction. This causes you to release allergic antibodies. Through a chain of events, these eventually cause you to release histamine into the blood stream. Although meant to protect the body, it is this release of histamine that causes your discomfort, such as frequent sneezing, congestion, and an itchy, runny nose.  CAUSES  Seasonal allergic rhinitis  (hay fever) is caused by pollen allergens that may come from grasses, trees, and weeds. Year-round allergic rhinitis (perennial allergic rhinitis) is caused by allergens such as house dust mites, pet dander, and mold spores.  SYMPTOMS  Nasal stuffiness (congestion). Itchy, runny nose with sneezing and tearing of the eyes. DIAGNOSIS  Your health care provider can help you determine the allergen or allergens that trigger your symptoms. If you and your health care provider are unable to determine the allergen, skin or blood testing may be used. TREATMENT  Allergic rhinitis does not have a cure, but it can be controlled by: Medicines and allergy shots (immunotherapy). Avoiding the allergen. Hay fever may often be treated with antihistamines in pill or nasal spray forms. Antihistamines block the effects of histamine. There are over-the-counter medicines that may help with nasal congestion and swelling around the eyes. Check with your health care provider before taking or giving this medicine.  If avoiding the allergen or the medicine prescribed do not work, there are many new medicines your health care provider can prescribe. Stronger medicine may be used if initial measures are ineffective. Desensitizing injections can be used if medicine and avoidance does not work. Desensitization is when a patient is given ongoing shots until the body becomes less sensitive to the allergen. Make sure you follow up with your health care provider if problems continue. HOME CARE INSTRUCTIONS It is not possible to completely avoid allergens, but you can reduce your symptoms by taking steps to limit your exposure to them. It helps to know exactly what you  are allergic to so that you can avoid your specific triggers. SEEK MEDICAL CARE IF:  You have a fever. You develop a cough that does not stop easily (persistent). You have shortness of breath. You start wheezing. Symptoms interfere with normal daily  activities. Document Released: 09/03/2001 Document Revised: 12/14/2013 Document Reviewed: 08/16/2013 [facility] Patient Information 2015 [place], [place]. This information is not intended to replace advice given to you by your health care provider. Make sure you discuss any questions you have with your health care provider.

## 2015-04-21 NOTE — ED Notes (Signed)
Pt c/o cough and congestion since Sunday. Pt denies chest pain but sts "I feel short of breath because I can't breathe through my nose." Pt A&Ox4 and ambulatory. Pt has been taking mucinex at home which she sts helps with her cough. Pt denies fevers.

## 2015-04-21 NOTE — ED Provider Notes (Signed)
CSN: [id number]     Arrival date & time 04/21/15  1836 History  This chart was scribed for non-physician practitioner, Fayrene HelperBowie Jaeden Westbay, PA-C,working with Richardean Canalavid H Yao, MD, by Karle PlumberJennifer Tensley, ED Scribe. This patient was seen in room WTR5/WTR5 and the patient's care was started at 6:50 PM.  Chief Complaint  Patient presents with  . Nasal Congestion  . Cough   Patient is a 39 y.o. female presenting with cough. The history is provided by the patient and medical records. No language interpreter was used.  Cough Associated symptoms: no chills, no ear pain, no eye discharge and no fever     HPI Comments:  Susan Warren is a 39 y.o. morbidly obese female with PMHx of seasonal allergies who presents to the Emergency Department complaining of nasal congestion that started about 5 days ago. She reports associated cough, sneezing and hoarseness. She reports using an OTC allergy medication with minimal relief of her symptoms. Denies any modifying factors of the complaints. Denies using any new soaps, detergents, creams, lotions or other personal hygiene items. Denies itchy, watery eyes, otalgia, fever, chills, nausea or vomiting. Denies h/o asthma, DM or COPD. PMHx of HTN, depression, GERD and anxiety.  Past Medical History  Diagnosis Date  . Hypertension   . Depression   . GERD (gastroesophageal reflux disease)   . Allergy   . Migraines   . Anxiety    Past Surgical History  Procedure Laterality Date  . Cholecystectomy     Family History  Problem Relation Age of Onset  . Hypertension Maternal Grandmother   . Diabetes Maternal Grandmother   . Breast cancer Maternal Grandmother   . Cancer Maternal Grandmother     breast cancer  . Stroke Maternal Grandmother   . Hypertension Mother    History  Substance Use Topics  . Smoking status: Never Smoker   . Smokeless tobacco: Never Used  . Alcohol Use: No   OB History    Gravida Para Term Preterm AB TAB SAB Ectopic Multiple Living   1 1 1  0 0 0 0 0  0 1     Review of Systems  Constitutional: Negative for fever and chills.  HENT: Positive for sneezing. Negative for ear pain and voice change.   Eyes: Negative for discharge and itching.  Respiratory: Positive for cough.   Gastrointestinal: Negative for nausea and vomiting.    Allergies  Ace inhibitors and Angiotensin receptor blockers  Home Medications   Prior to Admission medications   Medication Sig Start Date End Date Taking? Authorizing Provider  aspirin-acetaminophen-caffeine (EXCEDRIN MIGRAINE) [phone] MG per tablet Take 1 tablet by mouth every 6 (six) hours as needed for headache.    Historical Provider, MD  escitalopram (LEXAPRO) 10 MG tablet Take 10 mg by mouth daily.    Historical Provider, MD  hydrochlorothiazide (HYDRODIURIL) 25 MG tablet Take 1 tablet (25 mg total) by mouth daily. 04/07/15   Leana Roeawood S Elgergawy, MD  ibuprofen (ADVIL,MOTRIN) 200 MG tablet Take 400 mg by mouth every 6 (six) hours as needed for moderate pain.    Historical Provider, MD  methylPREDNISolone (MEDROL DOSEPAK) 4 MG TBPK tablet Please use as instructed, 5 tablets on day 1, 4 tablets on day 2, 3 tablets on day 3, 2 tablets on day 4, 1 tablet Sunday 5, then stop. 04/07/15   Leana Roeawood S Elgergawy, MD  ranitidine (ZANTAC) 150 MG tablet Take 1 tablet (150 mg total) by mouth 2 (two) times daily. 04/07/15 04/17/15  Leana Roeawood S  Elgergawy, MD   Triage Vitals: BP 126/73 mmHg  Pulse 98  Temp(Src) 98.1 F (36.7 C) (Oral)  Resp 18  SpO2 99% Physical Exam  Constitutional:  Morbidly obese. Voice is hoarse.  HENT:  Head: Atraumatic.  Right Ear: Tympanic membrane normal.  Left Ear: Tympanic membrane normal.  Nose: Nose normal.  Mouth/Throat: Uvula is midline and oropharynx is clear and moist.  Cardiovascular: Regular rhythm and normal heart sounds.  Tachycardia present.  Exam reveals no gallop and no friction rub.   No murmur heard. Pulmonary/Chest: Effort normal and breath sounds normal. No respiratory  distress. She has no wheezes. She has no rales.    ED Course  Procedures (including critical care time) DIAGNOSTIC STUDIES: Oxygen Saturation is 99% on RA, normal by my interpretation.   COORDINATION OF CARE: 6:55 PM- Will prescribe Flonase. Pt verbalizes understanding and agrees to plan.  Patient with congestions and hoarseness. Lungs were clear, no hypoxia, no fever. Suspect allergies. Prescribed Flonase and steroid taper. Return precautions discussed.  Medications - No data to display  Labs Review Labs Reviewed - No data to display  Imaging Review No results found.   EKG Interpretation None      MDM   Final diagnoses:  Nasal congestion  Laryngitis    BP 126/73 mmHg  Pulse 98  Temp(Src) 98.1 F (36.7 C) (Oral)  Resp 18  SpO2 99%   I personally performed the services described in this documentation, which was scribed in my presence. The recorded information has been reviewed and is accurate.    Fayrene Helper, PA-C 04/21/15 1904  Richardean Canal, MD 04/22/15 [phone]

## 2016-03-19 ENCOUNTER — Emergency Department (HOSPITAL_COMMUNITY)
Admission: EM | Admit: 2016-03-19 | Discharge: 2016-03-19 | Disposition: A | Discharge status: Home / Self Care | Payer: Medicaid Other | Attending: Emergency Medicine | Admitting: Emergency Medicine

## 2016-03-19 ENCOUNTER — Encounter (HOSPITAL_COMMUNITY): Payer: Self-pay

## 2016-03-19 ENCOUNTER — Emergency Department (HOSPITAL_COMMUNITY): Payer: Medicaid Other

## 2016-03-19 DIAGNOSIS — Z7952 Long term (current) use of systemic steroids: Secondary | ICD-10-CM | POA: Diagnosis not present

## 2016-03-19 DIAGNOSIS — I10 Essential (primary) hypertension: Secondary | ICD-10-CM | POA: Diagnosis not present

## 2016-03-19 DIAGNOSIS — F419 Anxiety disorder, unspecified: Secondary | ICD-10-CM | POA: Insufficient documentation

## 2016-03-19 DIAGNOSIS — K219 Gastro-esophageal reflux disease without esophagitis: Secondary | ICD-10-CM | POA: Diagnosis not present

## 2016-03-19 DIAGNOSIS — R05 Cough: Secondary | ICD-10-CM | POA: Insufficient documentation

## 2016-03-19 DIAGNOSIS — R079 Chest pain, unspecified: Secondary | ICD-10-CM | POA: Diagnosis not present

## 2016-03-19 DIAGNOSIS — F329 Major depressive disorder, single episode, unspecified: Secondary | ICD-10-CM | POA: Diagnosis not present

## 2016-03-19 DIAGNOSIS — R000 Tachycardia, unspecified: Secondary | ICD-10-CM | POA: Insufficient documentation

## 2016-03-19 DIAGNOSIS — G43909 Migraine, unspecified, not intractable, without status migrainosus: Secondary | ICD-10-CM | POA: Diagnosis not present

## 2016-03-19 DIAGNOSIS — Z79899 Other long term (current) drug therapy: Secondary | ICD-10-CM | POA: Insufficient documentation

## 2016-03-19 LAB — BASIC METABOLIC PANEL
ANION GAP: 10 (ref 5–15)
BUN: 9 mg/dL (ref 6–20)
CALCIUM: 8.9 mg/dL (ref 8.9–10.3)
CO2: 25 mmol/L (ref 22–32)
Chloride: 107 mmol/L (ref 101–111)
Creatinine, Ser: 0.66 mg/dL (ref 0.44–1.00)
Glucose, Bld: 115 mg/dL — ABNORMAL HIGH (ref 65–99)
Potassium: 3.7 mmol/L (ref 3.5–5.1)
Sodium: 142 mmol/L (ref 135–145)

## 2016-03-19 LAB — I-STAT TROPONIN, ED
TROPONIN I, POC: 0 ng/mL (ref 0.00–0.08)
Troponin i, poc: 0 ng/mL (ref 0.00–0.08)

## 2016-03-19 LAB — CBC
HCT: 40.4 % (ref 36.0–46.0)
HEMOGLOBIN: 13.6 g/dL (ref 12.0–15.0)
MCH: 29.1 pg (ref 26.0–34.0)
MCHC: 33.7 g/dL (ref 30.0–36.0)
MCV: 86.5 fL (ref 78.0–100.0)
Platelets: 285 10*3/uL (ref 150–400)
RBC: 4.67 MIL/uL (ref 3.87–5.11)
RDW: 13.6 % (ref 11.5–15.5)
WBC: 6.7 10*3/uL (ref 4.0–10.5)

## 2016-03-19 LAB — D-DIMER, QUANTITATIVE (NOT AT ARMC): D DIMER QUANT: 0.57 ug{FEU}/mL — AB (ref 0.00–0.50)

## 2016-03-19 MED ORDER — IOPAMIDOL (ISOVUE-370) INJECTION 76%
100.0000 mL | Freq: Once | INTRAVENOUS | Status: AC | PRN
  Administered 2016-03-19: 100 mL via INTRAVENOUS

## 2016-03-19 NOTE — ED Notes (Signed)
Pt states cough on Friday.  Started having chest pain last night and today.  Pain in center chest.  Also states feet swelling.

## 2016-03-19 NOTE — ED Notes (Signed)
Patient was alert, oriented and stable upon discharge. RN went over AVS and patient had no further questions. Pt was advised to f/u with cardiologist consult on paperwork.

## 2016-03-19 NOTE — Discharge Instructions (Signed)
Nonspecific Chest Pain  °Chest pain can be caused by many different conditions. There is always a chance that your pain could be related to something serious, such as a heart attack or a blood clot in your lungs. Chest pain can also be caused by conditions that are not life-threatening. If you have chest pain, it is very important to follow up with your health care provider. °CAUSES  °Chest pain can be caused by: °· Heartburn. °· Pneumonia or bronchitis. °· Anxiety or stress. °· Inflammation around your heart (pericarditis) or lung (pleuritis or pleurisy). °· A blood clot in your lung. °· A collapsed lung (pneumothorax). It can develop suddenly on its own (spontaneous pneumothorax) or from trauma to the chest. °· Shingles infection (varicella-zoster virus). °· Heart attack. °· Damage to the bones, muscles, and cartilage that make up your chest wall. This can include: °¨ Bruised bones due to injury. °¨ Strained muscles or cartilage due to frequent or repeated coughing or overwork. °¨ Fracture to one or more ribs. °¨ Sore cartilage due to inflammation (costochondritis). °RISK FACTORS  °Risk factors for chest pain may include: °· Activities that increase your risk for trauma or injury to your chest. °· Respiratory infections or conditions that cause frequent coughing. °· Medical conditions or overeating that can cause heartburn. °· Heart disease or family history of heart disease. °· Conditions or health behaviors that increase your risk of developing a blood clot. °· Having had chicken pox (varicella zoster). °SIGNS AND SYMPTOMS °Chest pain can feel like: °· Burning or tingling on the surface of your chest or deep in your chest. °· Crushing, pressure, aching, or squeezing pain. °· Dull or sharp pain that is worse when you move, cough, or take a deep breath. °· Pain that is also felt in your back, neck, shoulder, or arm, or pain that spreads to any of these areas. °Your chest pain may come and go, or it may stay  constant. °DIAGNOSIS °Lab tests or other studies may be needed to find the cause of your pain. Your health care provider may have you take a test called an ambulatory ECG (electrocardiogram). An ECG records your heartbeat patterns at the time the test is performed. You may also have other tests, such as: °· Transthoracic echocardiogram (TTE). During echocardiography, sound waves are used to create a picture of all of the heart structures and to look at how blood flows through your heart. °· Transesophageal echocardiogram (TEE). This is a more advanced imaging test that obtains images from inside your body. It allows your health care provider to see your heart in finer detail. °· Cardiac monitoring. This allows your health care provider to monitor your heart rate and rhythm in real time. °· Holter monitor. This is a portable device that records your heartbeat and can help to diagnose abnormal heartbeats. It allows your health care provider to track your heart activity for several days, if needed. °· Stress tests. These can be done through exercise or by taking medicine that makes your heart beat more quickly. °· Blood tests. °· Imaging tests. °TREATMENT  °Your treatment depends on what is causing your chest pain. Treatment may include: °· Medicines. These may include: °¨ Acid blockers for heartburn. °¨ Anti-inflammatory medicine. °¨ Pain medicine for inflammatory conditions. °¨ Antibiotic medicine, if an infection is present. °¨ Medicines to dissolve blood clots. °¨ Medicines to treat coronary artery disease. °· Supportive care for conditions that do not require medicines. This may include: °¨ Resting. °¨ Applying heat   or cold packs to injured areas. °¨ Limiting activities until pain decreases. °HOME CARE INSTRUCTIONS °· If you were prescribed an antibiotic medicine, finish it all even if you start to feel better. °· Avoid any activities that bring on chest pain. °· Do not use any tobacco products, including  cigarettes, chewing tobacco, or electronic cigarettes. If you need help quitting, ask your health care provider. °· Do not drink alcohol. °· Take medicines only as directed by your health care provider. °· Keep all follow-up visits as directed by your health care provider. This is important. This includes any further testing if your chest pain does not go away. °· If heartburn is the cause for your chest pain, you may be told to keep your head raised (elevated) while sleeping. This reduces the chance that acid will go from your stomach into your esophagus. °· Make lifestyle changes as directed by your health care provider. These may include: °¨ Getting regular exercise. Ask your health care provider to suggest some activities that are safe for you. °¨ Eating a heart-healthy diet. A registered dietitian can help you to learn healthy eating options. °¨ Maintaining a healthy weight. °¨ Managing diabetes, if necessary. °¨ Reducing stress. °SEEK MEDICAL CARE IF: °· Your chest pain does not go away after treatment. °· You have a rash with blisters on your chest. °· You have a fever. °SEEK IMMEDIATE MEDICAL CARE IF:  °· Your chest pain is worse. °· You have an increasing cough, or you cough up blood. °· You have severe abdominal pain. °· You have severe weakness. °· You faint. °· You have chills. °· You have sudden, unexplained chest discomfort. °· You have sudden, unexplained discomfort in your arms, back, neck, or jaw. °· You have shortness of breath at any time. °· You suddenly start to sweat, or your skin gets clammy. °· You feel nauseous or you vomit. °· You suddenly feel light-headed or dizzy. °· Your heart begins to beat quickly, or it feels like it is skipping beats. °These symptoms may represent a serious problem that is an emergency. Do not wait to see if the symptoms will go away. Get medical help right away. Call your local emergency services (911 in the [place] Do not drive yourself to the hospital. °  °This  information is not intended to replace advice given to you by your health care provider. Make sure you discuss any questions you have with your health care provider. °  °Document Released: 09/18/2005 Document Revised: 12/30/2014 Document Reviewed: 07/15/2014 °Elsevier Interactive Patient Education ©2016 [employer] ° °

## 2016-03-19 NOTE — ED Provider Notes (Signed)
CSN: [id number]     Arrival date & time 03/19/16  1026 History   First MD Initiated Contact with Patient 03/19/16 1147     Chief Complaint  Patient presents with  . Chest Pain     (Consider location/radiation/quality/duration/timing/severity/associated sxs/prior Treatment) HPI Comments: Patient presents to the emergency department with chief complaint of chest pain. She states pain started last night. She reports that it has been intermittent. She denies any associated shortness of breath, diaphoresis, radiating symptoms. She describes pain as being in the center of her chest. There is no exertional component. She denies any history of PE, DVT, or ACS. There are no modifying factors. Patient reports that she has had a cough, but attributes this to allergies. She also states that she has felt heart palpitations.  The history is provided by the patient. No language interpreter was used.    Past Medical History  Diagnosis Date  . Hypertension   . Depression   . GERD (gastroesophageal reflux disease)   . Allergy   . Migraines   . Anxiety    Past Surgical History  Procedure Laterality Date  . Cholecystectomy     Family History  Problem Relation Age of Onset  . Hypertension Maternal Grandmother   . Diabetes Maternal Grandmother   . Breast cancer Maternal Grandmother   . Cancer Maternal Grandmother     breast cancer  . Stroke Maternal Grandmother   . Hypertension Mother    Social History  Substance Use Topics  . Smoking status: Never Smoker   . Smokeless tobacco: Never Used  . Alcohol Use: No   OB History    Gravida Para Term Preterm AB TAB SAB Ectopic Multiple Living   1 1 1  0 0 0 0 0 0 1     Review of Systems  Constitutional: Negative for fever and chills.  Respiratory: Positive for cough. Negative for shortness of breath.   Cardiovascular: Positive for chest pain.  Gastrointestinal: Negative for nausea, vomiting, diarrhea and constipation.  Genitourinary: Negative  for dysuria.  All other systems reviewed and are negative.     Allergies  Ace inhibitors and Angiotensin receptor blockers  Home Medications   Prior to Admission medications   Medication Sig Start Date End Date Taking? Authorizing Provider  aspirin-acetaminophen-caffeine (EXCEDRIN MIGRAINE) [phone] MG per tablet Take 1 tablet by mouth every 6 (six) hours as needed for headache.    Historical Provider, MD  escitalopram (LEXAPRO) 10 MG tablet Take 10 mg by mouth daily.    Historical Provider, MD  fluticasone (FLONASE) 50 MCG/ACT nasal spray Place 2 sprays into both nostrils daily. 04/21/15   Fayrene HelperBowie Tran, PA-C  hydrochlorothiazide (HYDRODIURIL) 25 MG tablet Take 1 tablet (25 mg total) by mouth daily. 04/07/15   Leana Roeawood S Elgergawy, MD  ibuprofen (ADVIL,MOTRIN) 200 MG tablet Take 400 mg by mouth every 6 (six) hours as needed for moderate pain.    Historical Provider, MD  methylPREDNISolone (MEDROL DOSEPAK) 4 MG TBPK tablet Please use as instructed, 5 tablets on day 1, 4 tablets on day 2, 3 tablets on day 3, 2 tablets on day 4, 1 tablet Sunday 5, then stop. 04/21/15   Fayrene HelperBowie Tran, PA-C  ranitidine (ZANTAC) 150 MG tablet Take 1 tablet (150 mg total) by mouth 2 (two) times daily. 04/07/15 04/17/15  Leana Roeawood S Elgergawy, MD   BP 212/102 mmHg  Pulse 90  Temp(Src) 98.1 F (36.7 C) (Oral)  Resp 20  SpO2 95% Physical Exam  Constitutional: She is oriented  to person, place, and time. She appears well-developed and well-nourished.  HENT:  Head: Normocephalic and atraumatic.  Eyes: Conjunctivae and EOM are normal. Pupils are equal, round, and reactive to light.  Neck: Normal range of motion. Neck supple.  Cardiovascular: Normal rate and regular rhythm.  Exam reveals no gallop and no friction rub.   No murmur heard. Tachycardic on my exam 110  Pulmonary/Chest: Effort normal and breath sounds normal. No respiratory distress. She has no wheezes. She has no rales. She exhibits no tenderness.  CTAB   Abdominal: Soft. Bowel sounds are normal. She exhibits no distension and no mass. There is no tenderness. There is no rebound and no guarding.  Musculoskeletal: Normal range of motion. She exhibits no edema or tenderness.  Neurological: She is alert and oriented to person, place, and time.  Skin: Skin is warm and dry.  Psychiatric: She has a normal mood and affect. Her behavior is normal. Judgment and thought content normal.  Nursing note and vitals reviewed.   ED Course  Procedures (including critical care time) Results for orders placed or performed during the hospital encounter of 03/19/16  Basic metabolic panel  Result Value Ref Range   Sodium 142 135 - 145 mmol/L   Potassium 3.7 3.5 - 5.1 mmol/L   Chloride 107 101 - 111 mmol/L   CO2 25 22 - 32 mmol/L   Glucose, Bld 115 (H) 65 - 99 mg/dL   BUN 9 6 - 20 mg/dL   Creatinine, Ser 9.60 0.44 - 1.00 mg/dL   Calcium 8.9 8.9 - 45.4 mg/dL   GFR calc non Af Amer >60 >60 mL/min   GFR calc Af Amer >60 >60 mL/min   Anion gap 10 5 - 15  CBC  Result Value Ref Range   WBC 6.7 4.0 - 10.5 K/uL   RBC 4.67 3.87 - 5.11 MIL/uL   Hemoglobin 13.6 12.0 - 15.0 g/dL   HCT 09.8 11.9 - 14.7 %   MCV 86.5 78.0 - 100.0 fL   MCH 29.1 26.0 - 34.0 pg   MCHC 33.7 30.0 - 36.0 g/dL   RDW 82.9 56.2 - 13.0 %   Platelets 285 150 - 400 K/uL  D-dimer, quantitative (not at [facility])  Result Value Ref Range   D-Dimer, Quant 0.57 (H) 0.00 - 0.50 ug/mL-FEU  I-Stat Troponin, ED (not at [facility])  Result Value Ref Range   Troponin i, poc 0.00 0.00 - 0.08 ng/mL   Comment 3           Dg Chest 2 View  03/19/2016  CLINICAL DATA:  Chest pain and cough EXAM: CHEST  2 VIEW COMPARISON:  None. FINDINGS: The heart size and mediastinal contours are within normal limits. Both lungs are clear. The visualized skeletal structures are unremarkable. IMPRESSION: No active cardiopulmonary disease. Electronically Signed   By: Alcide Clever M.D.   On: 03/19/2016 11:42   Ct Angio Chest Pe W/cm  &/or Wo Cm  03/19/2016  CLINICAL DATA:  Chest pain and shortness of breath for 1 day EXAM: CT ANGIOGRAPHY CHEST WITH CONTRAST TECHNIQUE: Multidetector CT imaging of the chest was performed using the standard protocol during bolus administration of intravenous contrast. Multiplanar CT image reconstructions and MIPs were obtained to evaluate the vascular anatomy. CONTRAST:  100 mL Isovue-300 nonionic COMPARISON:  Chest radiograph March 19, 2016 FINDINGS: Mediastinum/Lymph Nodes: There is no demonstrable pulmonary embolus. There is prominence of the ascending thoracic aorta with maximum transverse diameter of 4.0 x 4.0 cm. There is  no thoracic aortic dissection. The visualized great vessels appear unremarkable. Pericardium is not appreciably thickened. Visualized thyroid appears unremarkable. There is no appreciable thoracic adenopathy. Lungs/Pleura: There is mild patchy atelectatic change. There is no parenchymal lung edema or consolidation. Upper abdomen: Visualized upper abdominal structures appear normal. Musculoskeletal: There are no blastic or lytic bone lesions. Review of the MIP images confirms the above findings. IMPRESSION: No demonstrable pulmonary embolus. Prominence in the ascending thoracic aorta with a maximum transverse diameter of 4.0 x 4.0 cm. Recommend annual imaging followup by CTA or MRA. This recommendation follows 2010 ACCF/AHA/AATS/ACR/ASA/SCA/SCAI/SIR/STS/SVM Guidelines for the Diagnosis and Management of Patients with Thoracic Aortic Disease. Circulation. 2010; 121: Z610-R604 Areas of mild atelectasis. No edema or consolidation. No apparent adenopathy. Electronically Signed   By: Bretta Bang III M.D.   On: 03/19/2016 14:10    I have personally reviewed and evaluated these images and lab results as part of my medical decision-making.   EKG Interpretation None      MDM   Final diagnoses:  Chest pain, unspecified chest pain type    Patient with intermittent episodes of  chest pain that started last night. She is tachycardic on my exam, cannot use PERC.  CP is central.  Will check labs.  D-dimer is mildly elevated.  Will check CT.  HEART score is 2.  Low risk for ACS.  Delta trop is negative. PE study is negative, but does show enlarged aorta, which will require monitoring annually. I discussed this is detail and wrote it down on her paperwork (after printing).  She understands and agrees with follow-up plan.  Will call [facility] and also follow-up with cards.  Patient is stable and ready for discharge.      Roxy Horseman, PA-C 03/19/16 1618  Linwood Dibbles, MD 03/20/16 [phone]

## 2016-03-19 NOTE — ED Notes (Signed)
Patient transported to CT 

## 2016-03-19 NOTE — ED Notes (Signed)
IV attempted another RN to attempt.

## 2016-03-28 ENCOUNTER — Ambulatory Visit (INDEPENDENT_AMBULATORY_CARE_PROVIDER_SITE_OTHER): Payer: Medicaid Other | Admitting: Cardiology

## 2016-03-28 ENCOUNTER — Encounter: Payer: Self-pay | Admitting: Cardiology

## 2016-03-28 VITALS — BP 140/90 | HR 96 | Ht 63.0 in | Wt >= 6400 oz

## 2016-03-28 DIAGNOSIS — I10 Essential (primary) hypertension: Secondary | ICD-10-CM

## 2016-03-28 DIAGNOSIS — R0789 Other chest pain: Secondary | ICD-10-CM | POA: Diagnosis not present

## 2016-03-28 DIAGNOSIS — I7789 Other specified disorders of arteries and arterioles: Secondary | ICD-10-CM | POA: Diagnosis not present

## 2016-03-28 DIAGNOSIS — R600 Localized edema: Secondary | ICD-10-CM

## 2016-03-28 MED ORDER — VALSARTAN 160 MG PO TABS: 160.0000 mg | ORAL_TABLET | Freq: Every day | ORAL | Status: DC

## 2016-03-28 MED ORDER — FUROSEMIDE 20 MG PO TABS: 20.0000 mg | ORAL_TABLET | Freq: Every day | ORAL | Status: DC

## 2016-03-28 NOTE — Progress Notes (Signed)
Cardiology Office Note  NEW PATIENT  Date:  03/28/2016   ID:  Susan Warren, DOB 18-Mar-1976, MRN [id number]  PCP:  Oliver BarreJames John, MD  Cardiologist:  New Dr. Clifton JamesMcAlhany    Chief Complaint  Patient presents with  . Chest Pain      History of Present Illness: Susan Warren is a 40 y.o. female who presents for post Er visit for chest pain.  Pain began 03/18/16.   Mid sternal chest pain.  DDimer 0.57, troponin 0.0, CBC and Lytes normal.  CTA with neg PE, + Prominence in the ascending thoracic aorta with a maximum transverse diameter of 4.0 x 4.0 cm. Recommend annual imaging followup by CTA or MRA.  Areas of mild atelectasis. No edema or consolidation. No apparent Adenopathy.    EKG SR no acute changes.   Today has occ chest pressure not every day for over 2 weeks.  But with ER visit it was more intense pain on rt chest pain.  Increased with deep breath.  This has improved.  Her BP is elevated and she has no more lisinopril or HCTZ she took a family member's lasix and her chronic edema improved.    She has also developed cough with lisinopril.  She denies any palpitations or fevers, colds.   With her job she sits all day.  Works 12-14 hours a day.  She does snore, but is adament she does not have sleep apnea.  She has morbid obesity.    Past Medical History  Diagnosis Date  . Hypertension   . Depression   . GERD (gastroesophageal reflux disease)   . Allergy   . Migraines   . Anxiety   . Sleep apnea     Past Surgical History  Procedure Laterality Date  . Cholecystectomy       Current Outpatient Prescriptions  Medication Sig Dispense Refill  . aspirin-acetaminophen-caffeine (EXCEDRIN MIGRAINE) 250-250-65 MG per tablet Take 2 tablets by mouth daily as needed for headache or migraine.     Marland Kitchen. dextromethorphan (DELSYM) 30 MG/5ML liquid Take 60 mg by mouth daily as needed for cough.    . fluticasone (FLONASE) 50 MCG/ACT nasal spray Place 2 sprays into both nostrils daily. 16 g 0  .  levonorgestrel (MIRENA) 20 MCG/24HR IUD 1 each by Intrauterine route once. Unknown placed date, has been less than 10 years. Has the 10 year IUD.    . furosemide (LASIX) 20 MG tablet Take 1 tablet (20 mg total) by mouth daily. 90 tablet 3  . valsartan (DIOVAN) 160 MG tablet Take 1 tablet (160 mg total) by mouth daily. 90 tablet 3   No current facility-administered medications for this visit.    Allergies:   Adhesive    Social History:  The patient  reports that she has never smoked. She has never used smokeless tobacco. She reports that she does not drink alcohol or use illicit drugs.   Family History:  The patient's family history includes Breast cancer in her maternal grandmother; Cancer in her maternal grandmother; Diabetes in her maternal grandmother; Hypertension in her maternal grandmother and mother; Stroke in her maternal grandmother; Thyroid disease in her mother. There is no history of Heart attack.    ROS:  General:no colds or fevers,  weight increase from 2/15  Skin:no rashes or ulcers HEENT:no blurred vision, no congestion CV:see HPI PUL:see HPI GI:no diarrhea constipation or melena, no indigestion GU:no hematuria, no dysuria MS:no joint pain, no claudication Neuro:no syncope, no lightheadedness Endo:no diabetes,  no thyroid disease  Wt Readings from Last 3 Encounters:  03/28/16 449 lb 12.8 oz (204.028 kg)  04/07/15 407 lb 1.6 oz (184.659 kg)  01/25/14 388 lb 2 oz (176.052 kg)     PHYSICAL EXAM: VS:  BP 140/90 mmHg  Pulse 96  Ht  (1.6 m)  Wt 449 lb 12.8 oz (204.028 kg)  BMI 79.70 kg/m2 , BMI Body mass index is 79.7 kg/(m^2). General:Pleasant affect, NAD Skin:Warm and dry, brisk capillary refill, + body art HEENT:normocephalic, sclera clear, mucus membranes moist Neck:supple, no JVD, no bruits  Heart:S1S2 RRR without murmur, gallup, rub or click Lungs:clear without rales, rhonchi, or wheezes OZH:YQMVH, soft, non tender, + BS, do not palpate liver spleen or  masses Ext:1-2 plus from knees down lower ext edema, 2+ pedal pulses, 2+ radial pulses Neuro:alert and oriented X 3, MAE, follows commands, + facial symmetry    EKG:  EKG is NOT  ordered today. The ekg from ER SR with no acute changes.    Recent Labs: 03/19/2016: BUN 9; Creatinine, Ser 0.66; Hemoglobin 13.6; Platelets 285; Potassium 3.7; Sodium 142    Lipid Panel    Component Value Date/Time   CHOL 233* 01/25/2014 1049   TRIG 113.0 01/25/2014 1049   HDL 44.20 01/25/2014 1049   CHOLHDL 5 01/25/2014 1049   VLDL 22.6 01/25/2014 1049   LDLDIRECT 177.9 01/25/2014 1049       Other studies Reviewed: Additional studies/ records that were reviewed today include: ER visit, CT of chest and labs. .   ASSESSMENT AND PLAN:  1.  Chest pain - chest pressure more at rest, may be BP alone will check Echo to eval valves and LV function and control BP. Will follow up with Dr. Clifton James in a few weeks post echo.  Dr. Clifton James did see pt and agreed with plan.   2. Prominent aorta - repeat CTA in 1 year per recommendations.  3.  HTN begin diovan 180 mg daily and lasix 20 mg daily BMP in 1 week to check K+ level.   4. Edema, lasix instead of HCTZ.  Discussed walking around office when at work so not to sit for so long.   5. Morbid obesity- I did not discuss at this time due to need to control BP first.  i would think she needs gastric sleeve perhaps, would be very difficult to lose wt. With diet.    Current medicines are reviewed with the patient today.  The patient Has no concerns regarding medicines.  The following changes have been made:  See above Labs/ tests ordered today include:see above  Disposition:   FU:  see above  Nyoka Lint, NP  03/28/2016 2:29 PM    [facility] [address] [place], [place], [place]  [address]/ [address] [place], [place] Phone: [phone]; Fax: [phone]

## 2016-03-28 NOTE — Patient Instructions (Signed)
Medication Instructions:  1) START Furosemide 20mg  once daily 2) START Diovan 160mg  once daily  Labwork: Your physician recommends that you return for lab work in: 1 week (BMET)   Testing/Procedures: Your physician has requested that you have an echocardiogram. Echocardiography is a painless test that uses sound waves to create images of your heart. It provides your doctor with information about the size and shape of your heart and how well your heart's chambers and valves are working. This procedure takes approximately one hour. There are no restrictions for this procedure.   Follow-Up: Your physician recommends that you schedule a follow-up appointment with Dr. Clifton JamesMcAlhany for shortly after your echocardiogram is complete.  Any Other Special Instructions Will Be Listed Below (If Applicable).     If you need a refill on your cardiac medications before your next appointment, please call your pharmacy.

## 2016-03-29 ENCOUNTER — Telehealth: Payer: Self-pay

## 2016-03-29 ENCOUNTER — Telehealth: Payer: Self-pay | Admitting: *Deleted

## 2016-03-29 NOTE — Telephone Encounter (Signed)
Diovan (brand) 160mg  is preferred by [employer]. PA # [id number]. Pharmacy notified.

## 2016-03-29 NOTE — Telephone Encounter (Signed)
Brand name preferred by [employer]. Approved for 1 year. PA. Pharmacist notified. Patient notified as well.

## 2016-03-29 NOTE — Telephone Encounter (Signed)
Per patient, [facility] informed her that the valsartan requires a prior authorization. She stated that she really needs to pick this up today and if we are unable to get it approved, she would like to switch to something else. Thanks, MI

## 2016-04-04 ENCOUNTER — Other Ambulatory Visit (INDEPENDENT_AMBULATORY_CARE_PROVIDER_SITE_OTHER): Payer: Medicaid Other | Admitting: *Deleted

## 2016-04-04 DIAGNOSIS — R0789 Other chest pain: Secondary | ICD-10-CM

## 2016-04-04 LAB — BASIC METABOLIC PANEL
BUN: 13 mg/dL (ref 7–25)
CHLORIDE: 103 mmol/L (ref 98–110)
CO2: 25 mmol/L (ref 20–31)
CREATININE: 0.79 mg/dL (ref 0.50–1.10)
Calcium: 8.9 mg/dL (ref 8.6–10.2)
Glucose, Bld: 110 mg/dL — ABNORMAL HIGH (ref 65–99)
POTASSIUM: 3.9 mmol/L (ref 3.5–5.3)
SODIUM: 140 mmol/L (ref 135–146)

## 2016-04-18 ENCOUNTER — Other Ambulatory Visit: Payer: Self-pay

## 2016-04-18 ENCOUNTER — Ambulatory Visit (HOSPITAL_COMMUNITY): Payer: Medicaid Other | Attending: Cardiology

## 2016-04-18 DIAGNOSIS — I340 Nonrheumatic mitral (valve) insufficiency: Secondary | ICD-10-CM | POA: Diagnosis not present

## 2016-04-18 DIAGNOSIS — R0789 Other chest pain: Secondary | ICD-10-CM

## 2016-04-18 DIAGNOSIS — Z6841 Body Mass Index (BMI) 40.0 and over, adult: Secondary | ICD-10-CM | POA: Diagnosis not present

## 2016-04-18 DIAGNOSIS — R079 Chest pain, unspecified: Secondary | ICD-10-CM | POA: Diagnosis present

## 2016-04-18 DIAGNOSIS — I119 Hypertensive heart disease without heart failure: Secondary | ICD-10-CM | POA: Diagnosis not present

## 2016-04-19 ENCOUNTER — Telehealth: Payer: Self-pay | Admitting: *Deleted

## 2016-04-19 DIAGNOSIS — R600 Localized edema: Secondary | ICD-10-CM

## 2016-04-19 DIAGNOSIS — I10 Essential (primary) hypertension: Secondary | ICD-10-CM

## 2016-04-19 MED ORDER — FUROSEMIDE 40 MG PO TABS: ORAL_TABLET | ORAL | Status: DC

## 2016-04-19 NOTE — Telephone Encounter (Signed)
Spoke with pt and informed her new orders per Nada BoozerLaura Ingold, NP. Verified pharmacy and sent in prescription. Pt verbalized understanding and was in agreement with this plan. Pt already scheduled to see Dr. Clifton JamesMcAlhany 5/5 and will have labs same day.

## 2016-04-19 NOTE — Telephone Encounter (Signed)
-----   Message from Leone BrandLaura R Ingold, NP sent at 04/19/2016  1:10 PM EDT ----- Increase lasix to 40 mg daily for 4 days then repeat BMP and see MD thanks.   ----- Message -----    From: Julio SicksJennifer L Nikaela Coyne, LPN    Sent: 1/19/14784/28/2017  11:28 AM      To: Leone BrandLaura R Ingold, NP  Informed pt of echo results. Pt verbalized understanding. Pt states that she doesn't really see any difference in her LE edema on the Lasix 20mg .   Pt states that it's about the same ( not getting any worse or better) Advised pt I would route this message to Vernona RiegerLaura for review and advisement.

## 2016-04-22 ENCOUNTER — Other Ambulatory Visit: Payer: Self-pay | Admitting: Cardiology

## 2016-04-26 ENCOUNTER — Other Ambulatory Visit: Payer: Medicaid Other

## 2016-04-26 ENCOUNTER — Ambulatory Visit: Payer: Medicaid Other | Admitting: Cardiovascular Disease

## 2016-05-09 ENCOUNTER — Other Ambulatory Visit (INDEPENDENT_AMBULATORY_CARE_PROVIDER_SITE_OTHER): Payer: Medicaid Other | Admitting: *Deleted

## 2016-05-09 DIAGNOSIS — R600 Localized edema: Secondary | ICD-10-CM

## 2016-05-09 DIAGNOSIS — I10 Essential (primary) hypertension: Secondary | ICD-10-CM

## 2016-05-09 LAB — BASIC METABOLIC PANEL
BUN: 14 mg/dL (ref 7–25)
CHLORIDE: 103 mmol/L (ref 98–110)
CO2: 25 mmol/L (ref 20–31)
Calcium: 9.1 mg/dL (ref 8.6–10.2)
Creat: 0.74 mg/dL (ref 0.50–1.10)
Glucose, Bld: 113 mg/dL — ABNORMAL HIGH (ref 65–99)
Potassium: 3.8 mmol/L (ref 3.5–5.3)
Sodium: 137 mmol/L (ref 135–146)

## 2016-05-13 ENCOUNTER — Telehealth: Payer: Self-pay | Admitting: *Deleted

## 2016-05-13 MED ORDER — FUROSEMIDE 40 MG PO TABS: ORAL_TABLET | ORAL | Status: DC

## 2016-05-13 NOTE — Telephone Encounter (Signed)
-----   Message from Leone BrandLaura R Ingold, NP sent at 05/13/2016 12:22 PM EDT ----- Pt may increase to 40 mg for - 5 days then back to 20 mg, has she stopped most salt?  No cheese, no fast food.  Keep her legs elevated.        ----- Message -----    From: Elliot CousinJennifer Doree Kuehne, RMA    Sent: 05/13/2016   9:51 AM      To: Leone BrandLaura R Ingold, NP  Pt has been made aware of her lab results. She stated that she wanted to increase the Lasix to 40 mg q day b/c the 20 mg was not helping.  She has edema in her legs.  Pt was supposed to see Dr. Clifton JamesMcAlhany 04/26/16 but she had to cancel b/c she was going out of town.  I told her I would send Vernona RiegerLaura a message and call her back when she responds.   Pt verbalized understanding.

## 2016-05-13 NOTE — Telephone Encounter (Signed)
Pt has been made aware to increase her lasix to 20 mg 2 daily X's 5 days.  She states that she doesn't eat salt.  She has been advised to cut cheese and fast food out of her diet.  Pt also advised to keep her legs elevated as much as possible, but she states that that wasn't going to happen b/c she works from 12-15 hr days. Pt advised that after she takes 40 mg lasix X's 5 days and if she swelled up again, that she needed to make an appt and be seen so they can check into if further. Pt verbalized understanding.

## 2016-07-22 ENCOUNTER — Ambulatory Visit: Payer: Medicaid Other | Admitting: Cardiovascular Disease

## 2016-07-22 NOTE — Progress Notes (Deleted)
No chief complaint on file.   History of Present Illness: 40 yo female with history of HTN, morbid obesity, dilated ascending aorta who is here today for follow up. She was seen in the ED March 2017 for chest pain. D-dimer elevated but CTA negative for PE. Slight dilation ascending aorta noted. Troponin negative. Seen in our office by Nada Boozer, NP April 2017. Echo April 2017 with normal LV function and no significant valve disease. Chest pain had resolved when she was seen in our office in April 2017 but Lasix added for LE edema.   She is here today for follow up.   Primary Care Physician: No PCP Per Patient  Past Medical History:  Diagnosis Date  . Allergy   . Anxiety   . Depression   . GERD (gastroesophageal reflux disease)   . Hypertension   . Migraines   . Sleep apnea     Past Surgical History:  Procedure Laterality Date  . CHOLECYSTECTOMY      Current Outpatient Prescriptions  Medication Sig Dispense Refill  . aspirin-acetaminophen-caffeine (EXCEDRIN MIGRAINE) 250-250-65 MG per tablet Take 2 tablets by mouth daily as needed for headache or migraine.     Marland Kitchen dextromethorphan (DELSYM) 30 MG/5ML liquid Take 60 mg by mouth daily as needed for cough.    . fluticasone (FLONASE) 50 MCG/ACT nasal spray Place 2 sprays into both nostrils daily. 16 g 0  . furosemide (LASIX) 20 MG tablet Take 1 tablet (20 mg total) by mouth daily. 90 tablet 3  . furosemide (LASIX) 40 MG tablet Take one tablet by mouth daily for 5 days 5 tablet 0  . levonorgestrel (MIRENA) 20 MCG/24HR IUD 1 each by Intrauterine route once. Unknown placed date, has been less than 10 years. Has the 10 year IUD.    Marland Kitchen valsartan (DIOVAN) 160 MG tablet Take 1 tablet (160 mg total) by mouth daily. 90 tablet 3   No current facility-administered medications for this visit.     Allergies  Allergen Reactions  . Adhesive [Tape] Anaphylaxis and Swelling    *Adhesive Spray*     Social History   Social History  .  Marital status: Married    Spouse name: N/A  . Number of children: N/A  . Years of education: 68   Occupational History  . Admin. [employer]   Social History Main Topics  . Smoking status: Never Smoker  . Smokeless tobacco: Never Used  . Alcohol use No  . Drug use: No  . Sexual activity: Yes    Birth control/ protection: IUD   Other Topics Concern  . Not on file   Social History Narrative   Married, 1 child    Family History  Problem Relation Age of Onset  . Hypertension Maternal Grandmother   . Diabetes Maternal Grandmother   . Breast cancer Maternal Grandmother   . Cancer Maternal Grandmother     breast cancer  . Stroke Maternal Grandmother   . Hypertension Mother   . Thyroid disease Mother   . Heart attack Neg Hx     Review of Systems:  As stated in the HPI and otherwise negative.   There were no vitals taken for this visit.  Physical Examination: General: Well developed, well nourished, NAD  HEENT: OP clear, mucus membranes moist  SKIN: warm, dry. No rashes. Neuro: No focal deficits  Musculoskeletal: Muscle strength 5/5 all ext  Psychiatric: Mood and affect normal  Neck: No JVD, no carotid bruits, no thyromegaly, no lymphadenopathy.  Lungs:Clear bilaterally, no wheezes, rhonci, crackles Cardiovascular: Regular rate and rhythm. No murmurs, gallops or rubs. Abdomen:Soft. Bowel sounds present. Non-tender.  Extremities: No lower extremity edema. Pulses are 2 + in the bilateral DP/PT.  Echo April 2017: Left ventricle: The cavity size was normal. Systolic function was   normal. The estimated ejection fraction was in the range of 55%   to 60%. Wall motion was normal; there were no regional wall   motion abnormalities. Left ventricular diastolic function   parameters were normal. - Mitral valve: There was trivial regurgitation. - Left atrium: The atrium was mildly dilated.  EKG:  EKG {ACTION; IS/IS [id number]} ordered today. The ekg ordered today  demonstrates ***  Recent Labs: 03/19/2016: Hemoglobin 13.6; Platelets 285 05/09/2016: BUN 14; Creat 0.74; Potassium 3.8; Sodium 137   Lipid Panel    Component Value Date/Time   CHOL 233 (H) 01/25/2014 1049   TRIG 113.0 01/25/2014 1049   HDL 44.20 01/25/2014 1049   CHOLHDL 5 01/25/2014 1049   VLDL 22.6 01/25/2014 1049   LDLDIRECT 177.9 01/25/2014 1049     Wt Readings from Last 3 Encounters:  03/28/16 (!) 449 lb 12.8 oz (204 kg)  04/07/15 (!) 407 lb 1.6 oz (184.7 kg)  01/25/14 (!) 388 lb 2 oz (176.1 kg)     Other studies Reviewed: Additional studies/ records that were reviewed today include: ***. Review of the above records demonstrates: ***    Assessment and Plan:   Current medicines are reviewed at length with the patient today.  The patient {ACTIONS; HAS/DOES NOT HAVE:19233} concerns regarding medicines.  The following changes have been made:  {PLAN; NO CHANGE:13088:s}  Labs/ tests ordered today include: *** No orders of the defined types were placed in this encounter.    Disposition:   FU with *** in {gen number [id number]} {TIME; UNITS DAY/WEEK/MONTH:19136}   Signed, Verne Carrow, MD 07/22/2016 7:20 AM    [facility] [address] [place], [place], [place]  [address] Phone: [phone]; Fax: [phone]

## 2016-07-24 ENCOUNTER — Encounter: Payer: Self-pay | Admitting: Cardiovascular Disease

## 2017-01-24 ENCOUNTER — Encounter (HOSPITAL_COMMUNITY): Payer: Self-pay

## 2017-01-24 ENCOUNTER — Emergency Department (HOSPITAL_COMMUNITY)
Admission: EM | Admit: 2017-01-24 | Discharge: 2017-01-24 | Disposition: A | Discharge status: Home / Self Care | Payer: 59 | Attending: Emergency Medicine | Admitting: Emergency Medicine

## 2017-01-24 ENCOUNTER — Emergency Department (HOSPITAL_COMMUNITY): Payer: 59

## 2017-01-24 DIAGNOSIS — Y939 Activity, unspecified: Secondary | ICD-10-CM | POA: Diagnosis not present

## 2017-01-24 DIAGNOSIS — Y999 Unspecified external cause status: Secondary | ICD-10-CM | POA: Insufficient documentation

## 2017-01-24 DIAGNOSIS — I10 Essential (primary) hypertension: Secondary | ICD-10-CM | POA: Diagnosis not present

## 2017-01-24 DIAGNOSIS — X501XXA Overexertion from prolonged static or awkward postures, initial encounter: Secondary | ICD-10-CM | POA: Insufficient documentation

## 2017-01-24 DIAGNOSIS — M79671 Pain in right foot: Secondary | ICD-10-CM

## 2017-01-24 DIAGNOSIS — Z79899 Other long term (current) drug therapy: Secondary | ICD-10-CM | POA: Diagnosis not present

## 2017-01-24 DIAGNOSIS — Y929 Unspecified place or not applicable: Secondary | ICD-10-CM | POA: Insufficient documentation

## 2017-01-24 NOTE — ED Provider Notes (Signed)
[facility]-EMERGENCY DEPT Provider Note   CSN: [id number] Arrival date & time: 01/24/17  1702  By signing my name below, I, Teofilo PodMatthew P. Jamison, attest that this documentation has been prepared under the direction and in the presence of Azucena Kubayler Leaphart, PA-C. Electronically Signed: Teofilo PodMatthew P. Jamison, ED Scribe. 01/24/2017. 6:15 PM.    History   Chief Complaint Chief Complaint  Patient presents with  . Foot Pain    The history is provided by the patient. No language interpreter was used.   HPI Comments:  Susan Warren is a 41 y.o. female with PMHx of HTN who presents to the Emergency Department complaining of worsening right foot pain x 4 days. Pt states that 4 days ago when it icy and she slipped and her right foot rolled off of a step awkwardly. Pt did not fall or lose consciousness. She is able to move her toes, and states that the pain is primarily from her great toe and to the underside of her foot. Pt states that she did not take her BP medication this morning. Pt has used ice, and has taken tylenol with no relief. Pt denies other associated symptoms.  Patent states she is able to ambulate with normal gait.  Past Medical History:  Diagnosis Date  . Allergy   . Anxiety   . Depression   . GERD (gastroesophageal reflux disease)   . Hypertension   . Migraines   . Sleep apnea     Patient Active Problem List   Diagnosis Date Noted  . Angioedema 04/07/2015  . Preventative health care 01/25/2014  . Hypertension   . Depression   . GERD (gastroesophageal reflux disease)   . Allergy   . Migraines   . Anxiety   . IUD (intrauterine device) in place 03/10/2013    Past Surgical History:  Procedure Laterality Date  . CHOLECYSTECTOMY      OB History    Gravida Para Term Preterm AB Living   1 1 1  0 0 1   SAB TAB Ectopic Multiple Live Births   0 0 0 0         Home Medications    Prior to Admission medications   Medication Sig Start Date End Date Taking? Authorizing Provider    acetaminophen (TYLENOL) 500 MG tablet Take 1,000 mg by mouth every 6 (six) hours as needed for mild pain, moderate pain or headache.   Yes Historical Provider, MD  furosemide (LASIX) 20 MG tablet Take 1 tablet (20 mg total) by mouth daily. 03/28/16  Yes Leone BrandLaura R Ingold, NP  valsartan (DIOVAN) 160 MG tablet Take 1 tablet (160 mg total) by mouth daily. 03/28/16  Yes Leone BrandLaura R Ingold, NP  fluticasone (FLONASE) 50 MCG/ACT nasal spray Place 2 sprays into both nostrils daily. Patient not taking: Reported on 01/24/2017 04/21/15   Fayrene HelperBowie Tran, PA-C    Family History Family History  Problem Relation Age of Onset  . Hypertension Maternal Grandmother   . Diabetes Maternal Grandmother   . Breast cancer Maternal Grandmother   . Cancer Maternal Grandmother     breast cancer  . Stroke Maternal Grandmother   . Hypertension Mother   . Thyroid disease Mother   . Heart attack Neg Hx     Social History Social History  Substance Use Topics  . Smoking status: Never Smoker  . Smokeless tobacco: Never Used  . Alcohol use No     Allergies   Adhesive [tape]   Review of Systems Review of Systems  Musculoskeletal: Positive for arthralgias and joint swelling.  Neurological: Negative for syncope.  All other systems reviewed and are negative.    Physical Exam Updated Vital Signs BP 181/98   Pulse 88   Temp 97.9 F (36.6 C) (Oral)   Resp 18   SpO2 100%   Physical Exam  Constitutional: She appears well-developed and well-nourished. No distress.  Morbid obesity.  HENT:  Head: Normocephalic and atraumatic.  Eyes: Conjunctivae are normal.  Cardiovascular: Normal rate and intact distal pulses.   Pulmonary/Chest: Effort normal.  Abdominal: She exhibits no distension.  Musculoskeletal: Normal range of motion. She exhibits no edema or deformity.       Right foot: There is tenderness (over the medial arch). There is normal range of motion, no bony tenderness, no swelling, normal capillary refill, no  crepitus, no deformity and no laceration.  Refill is normal. Strength 5 out of 5 in lower extremities. Sensation intact to sharp/dull. DP pulses are 2+ bilaterally. Patient able to ambulate with normal gait. No ecchymosis, edema, erythema noted to the foot.  Patient with no pain to the right ankle. Full range of motion. No ecchymosis, edema, erythema, deformity noted.  Neurological: She is alert.  Skin: Skin is warm and dry. Capillary refill takes less than 2 seconds.  Psychiatric: She has a normal mood and affect.  Nursing note and vitals reviewed.    ED Treatments / Results  DIAGNOSTIC STUDIES:  Oxygen Saturation is 100% on RA, normal by my interpretation.    COORDINATION OF CARE:  6:15 PM Discussed treatment plan with pt at bedside and pt agreed to plan.   Labs (all labs ordered are listed, but only abnormal results are displayed) Labs Reviewed - No data to display  EKG  EKG Interpretation None       Radiology Dg Foot Complete Right  Result Date: 01/24/2017 CLINICAL DATA:  Plantar pain RIGHT foot, slipped on ice on stairs outside 4 days ago EXAM: RIGHT FOOT COMPLETE - 3+ VIEW COMPARISON:  None FINDINGS: Diffuse soft tissue swelling RIGHT foot and ankle. Large plantar and Achilles insertion calcaneal spurs. Osseous mineralization normal. Joint spaces preserved. No acute fracture, dislocation, or bone destruction. IMPRESSION: No acute osseous abnormalities. Calcaneal spurring. Electronically Signed   By: Ulyses Southward M.D.   On: 01/24/2017 17:44    Procedures Procedures (including critical care time)  Medications Ordered in ED Medications - No data to display   Initial Impression / Assessment and Plan / ED Course  I have reviewed the triage vital signs and the nursing notes.  Pertinent labs & imaging results that were available during my care of the patient were reviewed by me and considered in my medical decision making (see chart for details).     Patient X-Ray  negative for obvious fracture or dislocation. Pain managed in ED. Pt advised to follow up with orthopedics if symptoms persist for possibility of missed fracture diagnosis. Patient given postop shoe while in ED, conservative therapy recommended and discussed. Patient is hypertensive in the ED. States she cannot take her blood pressure medicine this morning. Denies any headache, vision changes, chest pain, shortness of breath. Encourage rice therapy. Encourage Motrin and Tylenol for pain. Patient will be dc home & is agreeable with above plan.   Final Clinical Impressions(s) / ED Diagnoses   Final diagnoses:  Right foot pain    New Prescriptions Discharge Medication List as of 01/24/2017  6:17 PM    I personally performed the services described in this  documentation, which was scribed in my presence. The recorded information has been reviewed and is accurate.     Rise Mu, PA-C 01/24/17 2018    Lavera Guise, MD 01/25/17 1256

## 2017-01-24 NOTE — ED Notes (Signed)
Ortho tech en route 

## 2017-01-24 NOTE — Discharge Instructions (Signed)
Your x-ray shows no signs of fracture. This is likely a sprain. Wear the walking boot for comfort. Please rest, ice, elevate her right foot. I have given you a referral to Dr. Victorino DikeHewitt which is an orthopedist. If her symptoms are not improving please follow-up with him for repeat x-rays in 1 week. Please take her blood pressure medicine at home. May take Motrin and Tylenol for pain. Soaking in a warm bath absence all also help your swelling and pain.

## 2017-01-24 NOTE — ED Triage Notes (Signed)
Pt states that she slipped on ice earlier this week and her R foot began hurting. She did not fall down. She states that today, it hurts to put any weight on it. A&Ox4. Ambulatory. Hypertensive in triage with a hx of same.

## 2017-02-05 ENCOUNTER — Emergency Department (HOSPITAL_COMMUNITY): Payer: 59

## 2017-02-05 ENCOUNTER — Emergency Department (HOSPITAL_COMMUNITY)
Admission: EM | Admit: 2017-02-05 | Discharge: 2017-02-05 | Disposition: A | Discharge status: Home / Self Care | Payer: 59 | Attending: Emergency Medicine | Admitting: Emergency Medicine

## 2017-02-05 ENCOUNTER — Encounter (HOSPITAL_COMMUNITY): Payer: Self-pay

## 2017-02-05 DIAGNOSIS — R079 Chest pain, unspecified: Secondary | ICD-10-CM

## 2017-02-05 DIAGNOSIS — R1907 Generalized intra-abdominal and pelvic swelling, mass and lump: Secondary | ICD-10-CM | POA: Insufficient documentation

## 2017-02-05 DIAGNOSIS — I7773 Dissection of renal artery: Secondary | ICD-10-CM | POA: Insufficient documentation

## 2017-02-05 DIAGNOSIS — R1900 Intra-abdominal and pelvic swelling, mass and lump, unspecified site: Secondary | ICD-10-CM

## 2017-02-05 DIAGNOSIS — G43809 Other migraine, not intractable, without status migrainosus: Secondary | ICD-10-CM | POA: Insufficient documentation

## 2017-02-05 DIAGNOSIS — R0602 Shortness of breath: Secondary | ICD-10-CM | POA: Insufficient documentation

## 2017-02-05 DIAGNOSIS — I159 Secondary hypertension, unspecified: Secondary | ICD-10-CM | POA: Diagnosis not present

## 2017-02-05 DIAGNOSIS — M7989 Other specified soft tissue disorders: Secondary | ICD-10-CM | POA: Diagnosis not present

## 2017-02-05 LAB — BASIC METABOLIC PANEL
Anion gap: 8 (ref 5–15)
BUN: 10 mg/dL (ref 6–20)
CO2: 27 mmol/L (ref 22–32)
Calcium: 9.1 mg/dL (ref 8.9–10.3)
Chloride: 105 mmol/L (ref 101–111)
Creatinine, Ser: 0.8 mg/dL (ref 0.44–1.00)
GFR calc Af Amer: >60 mL/min (ref >60)
GFR calc non Af Amer: >60 mL/min (ref >60)
GLUCOSE: 112 mg/dL — AB (ref 65–99)
POTASSIUM: 3.9 mmol/L (ref 3.5–5.1)
Sodium: 140 mmol/L (ref 135–145)

## 2017-02-05 LAB — CBC WITH DIFFERENTIAL/PLATELET
Basophils Absolute: 0 10*3/uL (ref 0.0–0.1)
Basophils Relative: 0 %
EOS PCT: 2 %
Eosinophils Absolute: 0.2 10*3/uL (ref 0.0–0.7)
HCT: 42.1 % (ref 36.0–46.0)
HEMOGLOBIN: 13.8 g/dL (ref 12.0–15.0)
LYMPHS ABS: 2 10*3/uL (ref 0.7–4.0)
LYMPHS PCT: 23 %
MCH: 29.1 pg (ref 26.0–34.0)
MCHC: 32.8 g/dL (ref 30.0–36.0)
MCV: 88.6 fL (ref 78.0–100.0)
MONOS PCT: 6 %
Monocytes Absolute: 0.5 10*3/uL (ref 0.1–1.0)
NEUTROS PCT: 69 %
Neutro Abs: 6 10*3/uL (ref 1.7–7.7)
Platelets: 264 10*3/uL (ref 150–400)
RBC: 4.75 MIL/uL (ref 3.87–5.11)
RDW: 13.9 % (ref 11.5–15.5)
WBC: 8.7 10*3/uL (ref 4.0–10.5)

## 2017-02-05 LAB — I-STAT BETA HCG BLOOD, ED (MC, WL, AP ONLY)

## 2017-02-05 LAB — BRAIN NATRIURETIC PEPTIDE: B Natriuretic Peptide: 46.7 pg/mL (ref 0.0–100.0)

## 2017-02-05 LAB — I-STAT TROPONIN, ED: Troponin i, poc: 0 ng/mL (ref 0.00–0.08)

## 2017-02-05 MED ORDER — METOCLOPRAMIDE HCL 5 MG/ML IJ SOLN
10.0000 mg | Freq: Once | INTRAMUSCULAR | Status: AC
  Administered 2017-02-05: 10 mg via INTRAVENOUS
  Filled 2017-02-05: qty 2

## 2017-02-05 MED ORDER — IOPAMIDOL (ISOVUE-370) INJECTION 76%
INTRAVENOUS | Status: AC
  Administered 2017-02-05: 100 mL via INTRAVENOUS
  Filled 2017-02-05: qty 100

## 2017-02-05 MED ORDER — DIPHENHYDRAMINE HCL 50 MG/ML IJ SOLN
25.0000 mg | Freq: Once | INTRAMUSCULAR | Status: AC
  Administered 2017-02-05: 25 mg via INTRAVENOUS
  Filled 2017-02-05: qty 1

## 2017-02-05 NOTE — ED Triage Notes (Signed)
Pt. Here for CP and SOB that started around midnight. Pt. sts the pain is like someone is sitting on her central chest, but denies radiation. Pt. Hx of CHF and has been taking her lasix, but feels she may have fluid on her lungs.

## 2017-02-05 NOTE — ED Provider Notes (Signed)
[facility]-EMERGENCY DEPT Provider Note   CSN: [id number] Arrival date & time: 02/05/17  0749     History   Chief Complaint Chief Complaint  Patient presents with  . Chest Pain    HPI Susan Warren is a 41 y.o. female.  HPI Susan Warren is a 41 y.o. female with history of acid reflux, anxiety, depression, hypertension, sleep apnea, morbid obesity, presents to emergency department complaining of chest pain or shortness of breath. Patient states her symptoms started in the middle of the night. She states she feels like she is having pressure to the center of her chest, states "feels like someone is sitting on my chest." She is also complaining of associated shortness of breath. She denies any pleuritic chest pain. She denies any cough. She reports that she has chronic extremity swelling, but feels like it is worse. She feels like she may have gained some fluid weight. She is currently taking Lasix for swelling and states she has been compliant with her medications. She denies any fever or chills. She denies any back pain. Abdominal pain. No dizziness. No nausea or vomiting. No treatment prior to coming in.  Past Medical History:  Diagnosis Date  . Allergy   . Anxiety   . Depression   . GERD (gastroesophageal reflux disease)   . Hypertension   . Migraines   . Sleep apnea     Patient Active Problem List   Diagnosis Date Noted  . Angioedema 04/07/2015  . Preventative health care 01/25/2014  . Hypertension   . Depression   . GERD (gastroesophageal reflux disease)   . Allergy   . Migraines   . Anxiety   . IUD (intrauterine device) in place 03/10/2013    Past Surgical History:  Procedure Laterality Date  . CHOLECYSTECTOMY      OB History    Gravida Para Term Preterm AB Living   1 1 1  0 0 1   SAB TAB Ectopic Multiple Live Births   0 0 0 0         Home Medications    Prior to Admission medications   Medication Sig Start Date End Date Taking? Authorizing Provider    acetaminophen (TYLENOL) 500 MG tablet Take 1,000 mg by mouth every 6 (six) hours as needed for mild pain, moderate pain or headache.    Historical Provider, MD  fluticasone (FLONASE) 50 MCG/ACT nasal spray Place 2 sprays into both nostrils daily. Patient not taking: Reported on 01/24/2017 04/21/15   Fayrene Helper, PA-C  furosemide (LASIX) 20 MG tablet Take 1 tablet (20 mg total) by mouth daily. 03/28/16   Leone Brand, NP  valsartan (DIOVAN) 160 MG tablet Take 1 tablet (160 mg total) by mouth daily. 03/28/16   Leone Brand, NP    Family History Family History  Problem Relation Age of Onset  . Hypertension Mother   . Thyroid disease Mother   . Hypertension Maternal Grandmother   . Diabetes Maternal Grandmother   . Breast cancer Maternal Grandmother   . Cancer Maternal Grandmother     breast cancer  . Stroke Maternal Grandmother   . Heart attack Neg Hx     Social History Social History  Substance Use Topics  . Smoking status: Never Smoker  . Smokeless tobacco: Never Used  . Alcohol use No     Allergies   Adhesive [tape]   Review of Systems Review of Systems  Constitutional: Negative for chills and fever.  Respiratory: Positive for chest  tightness and shortness of breath. Negative for cough.   Cardiovascular: Positive for chest pain and leg swelling. Negative for palpitations.  Gastrointestinal: Negative for abdominal pain, diarrhea, nausea and vomiting.  Genitourinary: Negative for dysuria, flank pain and pelvic pain.  Musculoskeletal: Negative for arthralgias, myalgias, neck pain and neck stiffness.  Skin: Negative for rash.  Neurological: Negative for dizziness, weakness and headaches.  All other systems reviewed and are negative.    Physical Exam Updated Vital Signs Ht 5\' 4"  (1.626 m)   Wt (!) 203.7 kg   BMI 77.07 kg/m   Physical Exam  Constitutional: She is oriented to person, place, and time. She appears well-developed and well-nourished. No distress.  Morbidly  obese  HENT:  Head: Normocephalic.  Eyes: Conjunctivae are normal.  Neck: Neck supple.  Cardiovascular: Normal rate, regular rhythm and normal heart sounds.   Pulmonary/Chest: Effort normal and breath sounds normal. No respiratory distress. She has no wheezes. She has no rales.  Abdominal: Soft. Bowel sounds are normal. She exhibits no distension. There is no tenderness. There is no rebound.  Musculoskeletal: She exhibits edema.  Bilateral LE swelling  Neurological: She is alert and oriented to person, place, and time.  Skin: Skin is warm and dry.  Psychiatric: She has a normal mood and affect. Her behavior is normal.  Nursing note and vitals reviewed.    ED Treatments / Results  Labs (all labs ordered are listed, but only abnormal results are displayed) Labs Reviewed  CBC WITH DIFFERENTIAL/PLATELET  BASIC METABOLIC PANEL  BRAIN NATRIURETIC PEPTIDE  I-STAT TROPOININ, ED    EKG  EKG Interpretation None       Radiology No results found.  Procedures Procedures (including critical care time)  Medications Ordered in ED Medications - No data to display   Initial Impression / Assessment and Plan / ED Course  I have reviewed the triage vital signs and the nursing notes.  Pertinent labs & imaging results that were available during my care of the patient were reviewed by me and considered in my medical decision making (see chart for details).   patient in emergency department with chest pressure and shortness of breath. No prior cardiac history. Was seen in emergency department approximately ago for the same, had negative workup, had negative echo as an outpatient and has seen cardiologist. Patient was switched to Lasix at that time for her peripheral edema. Patient states she has been doing well since then. Will check labs, chest x-ray, will monitor while in emergency department. 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 Discussed with radiologist who recommended vascular consult. Also showing a large, 17 cm mass. Recommended outpatient MRI, I will have patient follow-up with her family doctor for further evaluation of this mass. Results discussed with patient. Will page vascular surgery.  5:11 PM Unable to get in touch with vascular surgery. I was told by answering service that he is in a warrant. We'll continue to wait on them to call back. Patient did develop migraine while in emergency department was treated with Reglan and Benadryl.  Pt signed out to Dr. Denton LankSteinl at shift change.   Vitals:   02/05/17 1600 02/05/17 1615 02/05/17 1630 02/05/17 1645  BP: (!) 176/114 174/86  179/96  Pulse: 81 98 94 81  Resp: (!) 28 24 13 15   SpO2: 97% 97% 98% 96%  Weight:      Height:  Final Clinical Impressions(s) / ED Diagnoses   Final diagnoses:  None    New Prescriptions New Prescriptions   No medications on file     Jaynie Crumble, PA-C 02/06/17 0810    Tilden Fossa, MD 02/07/17 [phone]

## 2017-02-05 NOTE — ED Notes (Signed)
Pt A&OX4, ambulatory at d/c with steady gait, NAD and verbalized understanding of d/c instructions and follow up care.  

## 2017-02-05 NOTE — Discharge Instructions (Signed)
It was our pleasure to provide your ER care today - we hope that you feel better.  Take an enteric coated aspirin a day.   Follow up with vascular surgery as discussed with Dr Darrick PennaFields.   The CT scan also showed a 17 cm right-sided abdominal mass.  Follow up with primary care doctor, and general surgeon, for further evaluation and treatment - call office tomorrow to arrange follow up in the next 1-2 weeks.   Return to ER if worse, new symptoms, new or severe pain, trouble breathing, other concern.

## 2017-02-05 NOTE — ED Notes (Signed)
IV team unable to get access. EDP at bedside with ultrasound.

## 2017-02-05 NOTE — ED Notes (Signed)
IV team at bedside 

## 2017-02-05 NOTE — ED Provider Notes (Signed)
Signed out by prior ED team that patient is awaiting vascular surgery consult.  Vascular did see pt in ED, and eval ct - they indicate no further evaluation needed, and rec d/c on asa q day.    Will also have pt f/u closely pcp/gs re further evaluation abd mass.   Recheck pt, no pain or discomfort. Afeb. Patient currently appears stable for d/c.      Cathren LaineKevin Rivers Hamrick, MD 02/05/17 [phone]

## 2017-02-05 NOTE — Consult Note (Signed)
[facility]  Patient name: Susan Warren MRN: [id number] DOB: Apr 24, 1976 Sex: female  REASON FOR CONSULT: left renal artery dissection, consult is from EDP.   HPI: Susan Warren is a 41 y.o. female, who presents with chest pain and shortness of breath that started since the middle of last night. She reports the pain as substernal and feels "like someone sitting on her chest." She denies any radiation of pain. She has had a prior episode of chest pain last year and was felt not to be ACS. She denies any back pain.  She denies any history of kidney disease. She does have hypertension on valsartan. Baseline systolic blood pressure is 150s.   Past Medical History:  Diagnosis Date  . Allergy   . Anxiety   . Depression   . GERD (gastroesophageal reflux disease)   . Hypertension   . Migraines   . Sleep apnea     Family History  Problem Relation Age of Onset  . Hypertension Mother   . Thyroid disease Mother   . Hypertension Maternal Grandmother   . Diabetes Maternal Grandmother   . Breast cancer Maternal Grandmother   . Cancer Maternal Grandmother     breast cancer  . Stroke Maternal Grandmother   . Heart attack Neg Hx     SOCIAL HISTORY: Social History   Social History  . Marital status: Married    Spouse name: N/A  . Number of children: N/A  . Years of education: 66   Occupational History  . Admin. [employer]   Social History Main Topics  . Smoking status: Never Smoker  . Smokeless tobacco: Never Used  . Alcohol use No  . Drug use: No  . Sexual activity: Yes    Birth control/ protection: IUD   Other Topics Concern  . Not on file   Social History Narrative   Married, 1 child    Allergies  Allergen Reactions  . Adhesive [Tape] Anaphylaxis and Swelling    *Adhesive Spray*     No current facility-administered medications for this encounter.    Current Outpatient Prescriptions  Medication Sig Dispense Refill  .  acetaminophen (TYLENOL) 500 MG tablet Take 1,000 mg by mouth every 6 (six) hours as needed for mild pain, moderate pain or headache.    . fluticasone (FLONASE) 50 MCG/ACT nasal spray Place 2 sprays into both nostrils daily. (Patient not taking: Reported on 01/24/2017) 16 g 0  . furosemide (LASIX) 20 MG tablet Take 1 tablet (20 mg total) by mouth daily. 90 tablet 3  . valsartan (DIOVAN) 160 MG tablet Take 1 tablet (160 mg total) by mouth daily. 90 tablet 3    REVIEW OF SYSTEMS:  [X]  denotes positive finding, [ ]  denotes negative finding Cardiac  Comments:  Chest pain or chest pressure: x   Shortness of breath upon exertion:    Short of breath when lying flat:    Irregular heart rhythm:        Vascular    Pain in calf, thigh, or hip brought on by ambulation:    Pain in feet at night that wakes you up from your sleep:     Blood clot in your veins:    Leg swelling:         Pulmonary    Oxygen at home:    Productive cough:     Wheezing:         Neurologic    Sudden weakness in arms  or legs:     Sudden numbness in arms or legs:     Sudden onset of difficulty speaking or slurred speech:    Temporary loss of vision in one eye:     Problems with dizziness:         Gastrointestinal    Blood in stool:     Vomited blood:         Genitourinary    Burning when urinating:     Blood in urine:        Psychiatric    Major depression:         Hematologic    Bleeding problems:    Problems with blood clotting too easily:        Skin    Rashes or ulcers:        Constitutional    Fever or chills:      PHYSICAL EXAM: Vitals:   02/05/17 1600 02/05/17 1615 02/05/17 1630 02/05/17 1645  BP: (!) 176/114 174/86  179/96  Pulse: 81 98 94 81  Resp: (!) 28 24 13 15   SpO2: 97% 97% 98% 96%  Weight:      Height:        GENERAL: The patient is a well-nourished, morbidly obese female, in no acute distress. The vital signs are documented above. CARDIAC: There is a regular rate and rhythm. No  carotid bruits.  VASCULAR: 2+ radial pulses bilaterally.  PULMONARY: Non labored respiratory effort.  MUSCULOSKELETAL: There are no major deformities or cyanosis. NEUROLOGIC: No focal weakness deficits.  SKIN: There are no ulcers or rashes noted. PSYCHIATRIC: The patient has a normal affect.  DATA:  CTA chest abd/pelvis 02/05/17  1. Indistinct and narrowed appearance of the proximal left renal artery suspicious for dissection, correlate for CVAT. Downstream vessels are opacified. No delayed imaging to determine presence of infarct. No signs of FMD in the other vessels. 2. No acute aortic finding, including dissection. Mild atherosclerosis. 3. 17 cm infrahepatic mass. Organ of origin is uncertain, but contact is greatest with the liver. The mass could be cystic or solid; it was not described on 1999 abdominal ultrasound report. If patient anatomy permits MRI, enhanced liver MRI would be preferred over abdomen/pelvis CT. 4. Bilateral inguinal lymphadenopathy, attention on follow-up.  MEDICAL ISSUES: Possible left renal artery dissection  The patient is asymptomatic. There is no left renal infarction. Her blood pressure is slightly elevated above baseline on single antihypertensive therapy. Her creatinine is normal. There are no comparative studies. This finding may chronic. No further work-up. Recommend daily aspirin. Dr. Darrick Penna to see patient.    Maris Berger, PA-C [facility] [place] [phone]  History and exam findings as above.  Pt has no abdominal or back tenderness.  BP today 170/86/  She is currently on one antihypertensive.  She has a normal creatinine.  Left renal artery does not appear completely normal but the opacification is not very good on the coronals and indistinct on the axial images.  Since she is asymptomatic and no appearance of FMD would recommend follow up with me in 6 months with dedicated CTA and to follow up on her BP.  Otherwise she  is apparently already scheduled for hepatic mass eval as outpt.  Fabienne Bruns, MD [facility] [place] Office: [phone] Pager: [phone]   BMET    Component Value Date/Time   NA 140 02/05/2017 0841   K 3.9 02/05/2017 0841   CL 105 02/05/2017 0841   CO2  27 02/05/2017 0841   GLUCOSE 112 (H) 02/05/2017 0841   BUN 10 02/05/2017 0841   CREATININE 0.80 02/05/2017 0841   CREATININE 0.74 05/09/2016 1327   CALCIUM 9.1 02/05/2017 0841   GFRNONAA >60 02/05/2017 0841   GFRAA >60 02/05/2017 0841    CBC    Component Value Date/Time   WBC 8.7 02/05/2017 0841   RBC 4.75 02/05/2017 0841   HGB 13.8 02/05/2017 0841   HCT 42.1 02/05/2017 0841   PLT 264 02/05/2017 0841   MCV 88.6 02/05/2017 0841   MCH 29.1 02/05/2017 0841   MCHC 32.8 02/05/2017 0841   RDW 13.9 02/05/2017 0841   LYMPHSABS 2.0 02/05/2017 0841   MONOABS 0.5 02/05/2017 0841   EOSABS 0.2 02/05/2017 0841   BASOSABS 0.0 02/05/2017 0841

## 2017-02-06 ENCOUNTER — Telehealth: Payer: Self-pay | Admitting: Vascular Surgery

## 2017-02-06 ENCOUNTER — Other Ambulatory Visit: Payer: Self-pay

## 2017-02-06 DIAGNOSIS — I7773 Dissection of renal artery: Secondary | ICD-10-CM

## 2017-02-06 NOTE — Telephone Encounter (Signed)
6 mo f/u w/ CTA Protocol for 8/16

## 2017-02-06 NOTE — Telephone Encounter (Signed)
-----   Message from Sharee PimpleMarilyn K McChesney, RN sent at 02/06/2017 12:47 PM EST ----- Regarding: schedule   ----- Message ----- From: Sherren Kernsharles E Fields, MD Sent: 02/05/2017   7:33 PM To: [facility] Charge Pool  Pt needs follow up appt with me with CTA abdomen pelvis in 6 months evaluate left renal artery  Consult was Manya SilvasSteinl [place] for left renal dissection  Fabienne Brunsharles Fields

## 2017-05-08 ENCOUNTER — Encounter (HOSPITAL_COMMUNITY): Payer: Self-pay | Admitting: Emergency Medicine

## 2017-05-08 ENCOUNTER — Emergency Department (HOSPITAL_COMMUNITY): Payer: 59

## 2017-05-08 ENCOUNTER — Emergency Department (HOSPITAL_COMMUNITY)
Admission: EM | Admit: 2017-05-08 | Discharge: 2017-05-08 | Disposition: A | Discharge status: Home / Self Care | Payer: 59 | Attending: Emergency Medicine | Admitting: Emergency Medicine

## 2017-05-08 DIAGNOSIS — R160 Hepatomegaly, not elsewhere classified: Secondary | ICD-10-CM | POA: Insufficient documentation

## 2017-05-08 DIAGNOSIS — R1084 Generalized abdominal pain: Secondary | ICD-10-CM

## 2017-05-08 DIAGNOSIS — I10 Essential (primary) hypertension: Secondary | ICD-10-CM | POA: Diagnosis not present

## 2017-05-08 LAB — COMPREHENSIVE METABOLIC PANEL
ALBUMIN: 3.7 g/dL (ref 3.5–5.0)
ALK PHOS: 80 U/L (ref 38–126)
ALT: 23 U/L (ref 14–54)
ANION GAP: 9 (ref 5–15)
AST: 29 U/L (ref 15–41)
BUN: 13 mg/dL (ref 6–20)
CHLORIDE: 105 mmol/L (ref 101–111)
CO2: 25 mmol/L (ref 22–32)
Calcium: 8.8 mg/dL — ABNORMAL LOW (ref 8.9–10.3)
Creatinine, Ser: 0.74 mg/dL (ref 0.44–1.00)
GFR calc Af Amer: >60 mL/min (ref >60)
GFR calc non Af Amer: >60 mL/min (ref >60)
GLUCOSE: 105 mg/dL — AB (ref 65–99)
POTASSIUM: 3.7 mmol/L (ref 3.5–5.1)
SODIUM: 139 mmol/L (ref 135–145)
TOTAL PROTEIN: 7.9 g/dL (ref 6.5–8.1)
Total Bilirubin: 1.2 mg/dL (ref 0.3–1.2)

## 2017-05-08 LAB — URINALYSIS, ROUTINE W REFLEX MICROSCOPIC
Bilirubin Urine: NEGATIVE
Glucose, UA: NEGATIVE mg/dL
Ketones, ur: NEGATIVE mg/dL
Nitrite: NEGATIVE
PH: 6 (ref 5.0–8.0)
Protein, ur: NEGATIVE mg/dL
Specific Gravity, Urine: 1.014 (ref 1.005–1.030)

## 2017-05-08 LAB — CBC WITH DIFFERENTIAL/PLATELET
Basophils Absolute: 0 10*3/uL (ref 0.0–0.1)
Basophils Relative: 0 %
EOS ABS: 0.3 10*3/uL (ref 0.0–0.7)
EOS PCT: 4 %
HCT: 42.2 % (ref 36.0–46.0)
Hemoglobin: 14.1 g/dL (ref 12.0–15.0)
LYMPHS ABS: 1.7 10*3/uL (ref 0.7–4.0)
Lymphocytes Relative: 20 %
MCH: 29.4 pg (ref 26.0–34.0)
MCHC: 33.4 g/dL (ref 30.0–36.0)
MCV: 88.1 fL (ref 78.0–100.0)
MONOS PCT: 6 %
Monocytes Absolute: 0.5 10*3/uL (ref 0.1–1.0)
Neutro Abs: 5.9 10*3/uL (ref 1.7–7.7)
Neutrophils Relative %: 70 %
PLATELETS: 287 10*3/uL (ref 150–400)
RBC: 4.79 MIL/uL (ref 3.87–5.11)
RDW: 13.7 % (ref 11.5–15.5)
WBC: 8.4 10*3/uL (ref 4.0–10.5)

## 2017-05-08 LAB — PREGNANCY, URINE: Preg Test, Ur: NEGATIVE

## 2017-05-08 LAB — LIPASE, BLOOD: Lipase: 31 U/L (ref 11–51)

## 2017-05-08 MED ORDER — DICYCLOMINE HCL 20 MG PO TABS: 20.0000 mg | ORAL_TABLET | Freq: Two times a day (BID) | ORAL | 0 refills | Status: DC

## 2017-05-08 MED ORDER — ONDANSETRON 4 MG PO TBDP: 4.0000 mg | ORAL_TABLET | Freq: Three times a day (TID) | ORAL | 0 refills | Status: DC | PRN

## 2017-05-08 NOTE — ED Notes (Signed)
Unsuccessful IV attempt x1 in L hand. Pt agrees to try an IV with the US machine.

## 2017-05-08 NOTE — ED Provider Notes (Signed)
Emergency Department Provider Note   I have reviewed the triage vital signs and the nursing notes.   HISTORY  Chief Complaint Abdominal Pain   HPI Susan Warren is a 41 y.o. female with PMH of HTN, GERD, and obesity presents to the ED for evaluation of diffuse abdominal pain starting yesterday. She has a history of an abdominal mass but has not had follow up in the last months. The patient states that she had intermittent sharp diffuse abdominal discomfort. No nausea, vomiting, diarrhea. No fevers or chills. The pain is not moved to one part of her abdomen or the other. No back pain. Patient has a history of cholecystectomy in the past. She was told about an abdominal mass during her last emergency department visit but is not followed up with primary care or gastroenterology for outpatient MRI.    Past Medical History:  Diagnosis Date  . Allergy   . Anxiety   . Depression   . GERD (gastroesophageal reflux disease)   . Hypertension   . Migraines   . Sleep apnea     Patient Active Problem List   Diagnosis Date Noted  . Angioedema 04/07/2015  . Preventative health care 01/25/2014  . Hypertension   . Depression   . GERD (gastroesophageal reflux disease)   . Allergy   . Migraines   . Anxiety   . IUD (intrauterine device) in place 03/10/2013    Past Surgical History:  Procedure Laterality Date  . CHOLECYSTECTOMY      Current Outpatient Rx  . Order #: [id number] Class: Historical Med  . Order #: [id number] Class: Normal  . Order #: [id number] Class: Historical Med  . Order #: [id number] Class: Normal  . Order #: [id number] Class: Print  . Order #: [id number] Class: Print  . Order #: [id number] Class: Print    Allergies Adhesive [tape]  Family History  Problem Relation Age of Onset  . Hypertension Mother   . Thyroid disease Mother   . Hypertension Maternal Grandmother   . Diabetes Maternal Grandmother   . Breast cancer Maternal Grandmother   . Cancer Maternal  Grandmother        breast cancer  . Stroke Maternal Grandmother   . Heart attack Neg Hx     Social History Social History  Substance Use Topics  . Smoking status: Never Smoker  . Smokeless tobacco: Never Used  . Alcohol use No    Review of Systems  Constitutional: No fever/chills Eyes: No visual changes. ENT: No sore throat. Cardiovascular: Denies chest pain. Respiratory: Denies shortness of breath. Gastrointestinal: Positive diffuse abdominal pain.  No nausea, no vomiting.  No diarrhea.  No constipation. Genitourinary: Negative for dysuria. Musculoskeletal: Negative for back pain. Skin: Negative for rash. Neurological: Negative for headaches, focal weakness or numbness.  10-point ROS otherwise negative.  ____________________________________________   PHYSICAL EXAM:  VITAL SIGNS: ED Triage Vitals  Enc Vitals Group     BP 05/08/17 0956 (!) 215/102     Pulse Rate 05/08/17 0956 86     Resp 05/08/17 0956 18     Temp 05/08/17 0956 97.8 F (36.6 C)     Temp Source 05/08/17 0956 Oral     SpO2 05/08/17 0956 97 %     Pain Score 05/08/17 1012 5   Constitutional: Alert and oriented. Well appearing and in no acute distress. Eyes: Conjunctivae are normal.  Head: Atraumatic. Nose: No congestion/rhinnorhea. Mouth/Throat: Mucous membranes are moist.   Neck: No stridor.   Cardiovascular: Normal rate, regular rhythm. Good peripheral  circulation. Grossly normal heart sounds.   Respiratory: Normal respiratory effort.  No retractions. Lungs CTAB. Gastrointestinal: Soft and nontender. Very obese abdomen. No appreciable distention.  Musculoskeletal: No lower extremity tenderness nor edema. No gross deformities of extremities. Neurologic:  Normal speech and language. No gross focal neurologic deficits are appreciated.  Skin:  Skin is warm, dry and intact. No rash noted. Psychiatric: Mood and affect are normal. Speech and behavior are  normal.  ____________________________________________   LABS (all labs ordered are listed, but only abnormal results are displayed)  Labs Reviewed  COMPREHENSIVE METABOLIC PANEL - Abnormal; Notable for the following:       Result Value   Glucose, Bld 105 (*)    Calcium 8.8 (*)    All other components within normal limits  URINALYSIS, ROUTINE W REFLEX MICROSCOPIC - Abnormal; Notable for the following:    Hgb urine dipstick LARGE (*)    Leukocytes, UA MODERATE (*)    Bacteria, UA RARE (*)    Squamous Epithelial / LPF 0-5 (*)    All other components within normal limits  LIPASE, BLOOD  CBC WITH DIFFERENTIAL/PLATELET  PREGNANCY, URINE   ____________________________________________  RADIOLOGY  Ct Renal Stone Study  Result Date: 05/08/2017 CLINICAL DATA:  Bilateral lower quadrant pain beginning yesterday. EXAM: CT ABDOMEN AND PELVIS WITHOUT CONTRAST TECHNIQUE: Multidetector CT imaging of the abdomen and pelvis was performed following the standard protocol without IV contrast. COMPARISON:  None. FINDINGS: Lower chest: No acute findings. Hepatobiliary: A large complex cystic lesion is seen arising from the inferior right hepatic lobe which measures 16 x 18 cm. This cannot be characterized on this noncontrast exam No other liver lesions are seen. Surgical clips from prior cholecystectomy. No evidence of biliary ductal dilatation. Pancreas: No mass or inflammatory process visualized on this unenhanced exam. Spleen:  Within normal limits in size. Adrenals/Urinary tract: No evidence of urolithiasis or hydronephrosis. Unremarkable unopacified urinary bladder. Stomach/Bowel: No evidence of obstruction, inflammatory process, or abnormal fluid collections. Vascular/Lymphatic: No pathologically enlarged lymph nodes identified. No evidence of abdominal aortic aneurysm. Aortic atherosclerosis. Reproductive: IUD in appropriate position. No mass or other significant abnormality. Other:  None.  Musculoskeletal:  No suspicious bone lesions identified. IMPRESSION: 18 cm complex cyst arising from the inferior right hepatic lobe, which cannot be characterized on this unenhanced exam. Abdomen MRI without and with contrast recommended for further characterization. Mild hepatic steatosis. Aortic atherosclerosis. Electronically Signed   By: Myles RosenthalJohn  Stahl M.D.   On: 05/08/2017 13:39    ____________________________________________   PROCEDURES  Procedure(s) performed:   Procedures  None ____________________________________________   INITIAL IMPRESSION / ASSESSMENT AND PLAN / ED COURSE  Pertinent labs & imaging results that were available during my care of the patient were reviewed by me and considered in my medical decision making (see chart for details).  Patient presents to the emergency room in for evaluation of diffuse, sharp, intermittent abdominal discomfort. Patient has significant obesity. She has mild diffuse tenderness throughout that is slightly worse in the right upper quadrant. Negative Murphy's sign. No fevers or chills. Patient was advised to follow up regarding a possible liver mass in February of this year but has not done so. Plan to follow lab work and reassess. No clear indication for emergent abdominal imaging at this time. I did encourage her to follow up on the abdominal mass, however. Plan to provide multiple contact her primary care physician and gastroenterology who can facilitate outpatient w/u.   CT renal shows continued presence of live  mass but is poorly characterized. Patient is beyond the weight limit of MRI here. Could attempt as an outpatient at [facility] or outpatient imaging center. No indication at this time for emergent CT but advised that the patient call PCP immediately to arrange for ASAP appointment and scheduling outpatient MRI. Discussed that this mass could represent a serious medical condition and that delaying evaluation could be harmful. Patient  verbalizes understanding. Reports that she now has insurance and will call severe PCP offices today.   At this time, I do not feel there is any life-threatening condition present. I have reviewed and discussed all results (EKG, imaging, lab, urine as appropriate), exam findings with patient. I have reviewed nursing notes and appropriate previous records.  I feel the patient is safe to be discharged home without further emergent workup. Discussed usual and customary return precautions. Patient and family (if present) verbalize understanding and are comfortable with this plan.  Patient will follow-up with their primary care provider. If they do not have a primary care provider, information for follow-up has been provided to them. All questions have been answered.  ____________________________________________  FINAL CLINICAL IMPRESSION(S) / ED DIAGNOSES  Final diagnoses:  Generalized abdominal pain  Liver mass     MEDICATIONS GIVEN DURING THIS VISIT:  None  NEW OUTPATIENT MEDICATIONS STARTED DURING THIS VISIT:  Discharge Medication List as of 05/08/2017  2:58 PM    START taking these medications   Details  dicyclomine (BENTYL) 20 MG tablet Take 1 tablet (20 mg total) by mouth 2 (two) times daily., Starting Thu 05/08/2017, Print    ondansetron (ZOFRAN ODT) 4 MG disintegrating tablet Take 1 tablet (4 mg total) by mouth every 8 (eight) hours as needed for nausea or vomiting., Starting Thu 05/08/2017, Print          Note:  This document was prepared using Dragon voice recognition software and may include unintentional dictation errors.  Alona Bene, MD Emergency Medicine   Long, Arlyss Repress, MD 05/09/17 [phone]

## 2017-05-08 NOTE — Discharge Instructions (Signed)
You have been seen in the Emergency Department (ED) for abdominal pain.  Your evaluation did not identify a clear cause of your symptoms but was generally reassuring.  You do have a liver mass that needs urgent abdominal MRI. You will need to have this ordered by your PCP as an outpatient. You need to try and get this done as soon as possible to prevent any delay in treatment.   Please follow up as instructed above regarding today?s emergent visit and the symptoms that are bothering you.  Return to the ED if your abdominal pain worsens or fails to improve, you develop bloody vomiting, bloody diarrhea, you are unable to tolerate fluids due to vomiting, fever greater than 101, or other symptoms that concern you.

## 2017-05-08 NOTE — ED Triage Notes (Signed)
Pt c/o RLQ and LLQ abdominal pain that began yesterday. Denies N/V/D. Pt denies constipation. Pts bp elevated in triage and patient states she has not taken bp meds because she didn't have time.

## 2017-07-23 ENCOUNTER — Other Ambulatory Visit: Payer: 59

## 2017-07-25 ENCOUNTER — Telehealth: Payer: Self-pay | Admitting: Vascular Surgery

## 2017-07-25 NOTE — Telephone Encounter (Signed)
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 Try all the numbers in her chart and put a message in EPIC of all our attempts. If we can't get her, then we tried our best. Thanks.  ----- Message ----- From: Jena Gaussoczniak, Michele A Sent: 07/25/2017  11:15 AM To: Sharee PimpleMarilyn K McChesney, RN Subject: RE: No show for CTA today                      I can not get in contact with this patient. She has no voicemail and has not picked up the phone when I call.  It looks like she would have about $1,000 charge for the CT, which might by why she no showed.  I could reschedule with [facility] but I don't know if we would be waisting the appt.  ----- Message ----- From: Sharee PimpleMcChesney, Marilyn K, RN Sent: 07/23/2017  11:55 AM To: [facility] Subject: No show for CTA today                          Carney BernJean with [facility] called and said this patient no showed for CT today. She is to see Dr. Darrick PennaFields on 8-16 so we have time to reschedule it. Thanks

## 2017-07-25 NOTE — Telephone Encounter (Signed)
Cxl'd 08/07/17 appt with [facility]. Tried multiple times to contact pt. VM is full, pt never picks up, emerg contact # is the same as the pt's.

## 2017-08-07 ENCOUNTER — Ambulatory Visit: Payer: 59 | Admitting: Vascular Surgery

## 2019-02-03 ENCOUNTER — Encounter: Payer: Self-pay | Admitting: Family Medicine

## 2019-02-04 ENCOUNTER — Ambulatory Visit: Payer: 59 | Admitting: Family Medicine

## 2019-02-21 ENCOUNTER — Encounter (HOSPITAL_COMMUNITY): Payer: Self-pay | Admitting: Emergency Medicine

## 2019-02-21 ENCOUNTER — Emergency Department (HOSPITAL_COMMUNITY)
Admission: EM | Admit: 2019-02-21 | Discharge: 2019-02-21 | Disposition: A | Discharge status: Home / Self Care | Payer: 59 | Attending: Emergency Medicine | Admitting: Emergency Medicine

## 2019-02-21 ENCOUNTER — Emergency Department (HOSPITAL_COMMUNITY): Payer: 59

## 2019-02-21 ENCOUNTER — Other Ambulatory Visit: Payer: Self-pay

## 2019-02-21 DIAGNOSIS — W228XXA Striking against or struck by other objects, initial encounter: Secondary | ICD-10-CM | POA: Diagnosis not present

## 2019-02-21 DIAGNOSIS — S92514A Nondisplaced fracture of proximal phalanx of right lesser toe(s), initial encounter for closed fracture: Secondary | ICD-10-CM | POA: Diagnosis not present

## 2019-02-21 DIAGNOSIS — F329 Major depressive disorder, single episode, unspecified: Secondary | ICD-10-CM | POA: Insufficient documentation

## 2019-02-21 DIAGNOSIS — F419 Anxiety disorder, unspecified: Secondary | ICD-10-CM | POA: Insufficient documentation

## 2019-02-21 DIAGNOSIS — Y9389 Activity, other specified: Secondary | ICD-10-CM | POA: Diagnosis not present

## 2019-02-21 DIAGNOSIS — I10 Essential (primary) hypertension: Secondary | ICD-10-CM | POA: Insufficient documentation

## 2019-02-21 DIAGNOSIS — Y92009 Unspecified place in unspecified non-institutional (private) residence as the place of occurrence of the external cause: Secondary | ICD-10-CM | POA: Insufficient documentation

## 2019-02-21 DIAGNOSIS — Y998 Other external cause status: Secondary | ICD-10-CM | POA: Insufficient documentation

## 2019-02-21 DIAGNOSIS — S99921A Unspecified injury of right foot, initial encounter: Secondary | ICD-10-CM | POA: Diagnosis present

## 2019-02-21 DIAGNOSIS — Z9049 Acquired absence of other specified parts of digestive tract: Secondary | ICD-10-CM | POA: Diagnosis not present

## 2019-02-21 DIAGNOSIS — R030 Elevated blood-pressure reading, without diagnosis of hypertension: Secondary | ICD-10-CM

## 2019-02-21 MED ORDER — LOSARTAN POTASSIUM 50 MG PO TABS: 100.0000 mg | ORAL_TABLET | Freq: Once | ORAL | Status: DC

## 2019-02-21 MED ORDER — AMLODIPINE BESYLATE 5 MG PO TABS
5.0000 mg | ORAL_TABLET | Freq: Once | ORAL | Status: AC
  Administered 2019-02-21: 5 mg via ORAL
  Filled 2019-02-21: qty 1

## 2019-02-21 MED ORDER — VALSARTAN 160 MG PO TABS: 160.0000 mg | ORAL_TABLET | Freq: Every day | ORAL | 0 refills | Status: DC

## 2019-02-21 MED ORDER — AMLODIPINE BESYLATE 5 MG PO TABS: 5.0000 mg | ORAL_TABLET | Freq: Every day | ORAL | 0 refills | Status: DC

## 2019-02-21 MED ORDER — HYDROCODONE-ACETAMINOPHEN 5-325 MG PO TABS
2.0000 | ORAL_TABLET | Freq: Once | ORAL | Status: AC
  Administered 2019-02-21: 2 via ORAL
  Filled 2019-02-21: qty 2

## 2019-02-21 NOTE — ED Provider Notes (Addendum)
[facility]-EMERGENCY DEPT Provider Note   CSN: [id number] Arrival date & time: 02/21/19  1037    History   Chief Complaint Chief Complaint  Patient presents with  . Toe Injury    HPI Susan Warren is a 43 y.o. female with history of hypertension, migraines, GERD and morbid obesity presenting today for right fourth toe pain  Patient reports that she was walking around her home today when she tripped over her cat and struck her fourth right toe on a nearby door.  Patient denies fall today and reports that she was able to maintain her balance and stay on her feet throughout the event.  Patient reports that when she stumbled forward she struck her toe on the door.  She endorses a moderate intensity throbbing pain to her right fourth toe which is constant and worsened with ambulation.  Patient reports mild amount of swelling to the toe and believes that it is fractured today.  Patient is without other complaints or concerns at this time.  She denies blood thinner use, head injury, loss of consciousness, numbness/weakness or tingling, neck/back pain, chest/abdominal pain or any other concerns.    HPI  Past Medical History:  Diagnosis Date  . Allergy   . Anxiety   . Depression   . GERD (gastroesophageal reflux disease)   . Hypertension   . Migraines   . Sleep apnea     Patient Active Problem List   Diagnosis Date Noted  . Angioedema 04/07/2015  . Preventative health care 01/25/2014  . Hypertension   . Depression   . GERD (gastroesophageal reflux disease)   . Allergy   . Migraines   . Anxiety   . IUD (intrauterine device) in place 03/10/2013    Past Surgical History:  Procedure Laterality Date  . CHOLECYSTECTOMY       OB History    Gravida  1   Para  1   Term  1   Preterm  0   AB  0   Living  1     SAB  0   TAB  0   Ectopic  0   Multiple  0   Live Births               Home Medications    Prior to Admission medications    Medication Sig Start Date End Date Taking? Authorizing Provider  amLODipine (NORVASC) 5 MG tablet Take 1 tablet (5 mg total) by mouth daily. 02/21/19   Bill Salinas, PA-C    Family History Family History  Problem Relation Age of Onset  . Hypertension Mother   . Thyroid disease Mother   . Hypertension Maternal Grandmother   . Diabetes Maternal Grandmother   . Breast cancer Maternal Grandmother   . Stroke Maternal Grandmother   . Heart attack Neg Hx     Social History Social History   Tobacco Use  . Smoking status: Never Smoker  . Smokeless tobacco: Never Used  Substance Use Topics  . Alcohol use: Yes    Comment: occ  . Drug use: No     Allergies   Adhesive [tape]   Review of Systems Review of Systems  Constitutional: Negative.  Negative for chills and fever.  Eyes: Negative.  Negative for visual disturbance.  Respiratory: Negative.  Negative for shortness of breath.   Cardiovascular: Negative.  Negative for chest pain.  Gastrointestinal: Negative.  Negative for abdominal pain, nausea and vomiting.  Genitourinary: Negative.  Negative  for dysuria and hematuria.  Musculoskeletal: Positive for arthralgias (Right fourth toe). Negative for back pain and neck pain.  Skin: Negative.  Negative for color change and wound.  Neurological: Negative.  Negative for dizziness, syncope, weakness, numbness and headaches.  All other systems reviewed and are negative.  Physical Exam Updated Vital Signs BP (!) 204/114 (BP Location: Right Wrist)   Pulse 83   Temp 98.7 F (37.1 C) (Oral)   Resp (!) 21   Ht 5\' 4"  (1.626 m)   Wt (!) 204.1 kg   SpO2 98%   BMI 77.24 kg/m   Physical Exam Constitutional:      General: She is not in acute distress.    Appearance: Normal appearance. She is well-developed. She is obese. She is not ill-appearing or diaphoretic.     Comments: Morbidly Obese  HENT:     Head: Normocephalic and atraumatic. No raccoon eyes or Battle's sign.     Jaw:  There is normal jaw occlusion. No trismus.     Right Ear: Tympanic membrane, ear canal and external ear normal. No hemotympanum.     Left Ear: Tympanic membrane, ear canal and external ear normal. No hemotympanum.     Nose: Nose normal.     Mouth/Throat:     Lips: Pink.     Mouth: Mucous membranes are moist.     Pharynx: Oropharynx is clear. Uvula midline.  Eyes:     General: Vision grossly intact. Gaze aligned appropriately.     Extraocular Movements: Extraocular movements intact.     Conjunctiva/sclera: Conjunctivae normal.     Pupils: Pupils are equal, round, and reactive to light.  Neck:     Musculoskeletal: Full passive range of motion without pain, normal range of motion and neck supple. No spinous process tenderness or muscular tenderness.     Trachea: Trachea and phonation normal. No tracheal deviation.  Cardiovascular:     Rate and Rhythm: Normal rate and regular rhythm.     Heart sounds: Normal heart sounds.  Pulmonary:     Effort: Pulmonary effort is normal. No respiratory distress.     Breath sounds: Normal breath sounds and air entry.  Chest:     Chest wall: No tenderness.  Abdominal:     Palpations: Abdomen is soft.     Tenderness: There is no abdominal tenderness. There is no guarding or rebound.  Musculoskeletal: Normal range of motion.       Feet:     Comments: No midline C/T/L spinal tenderness to palpation, no paraspinal muscle tenderness, no deformity, crepitus, or step-off noted. No sign of injury to the neck or back.  Hips stable to compression bilaterally without pain.  She is ambulatory in emergency department assistance or difficulty.  Feet:     Right foot:     Protective Sensation: 5 sites tested. 5 sites sensed.     Left foot:     Protective Sensation: 5 sites tested. 5 sites sensed.     Comments: Patient with tenderness to palpation of the right fourth toe with minimal swelling present.  Small amount of ecchymosis present around PIP.  Sensation and  capillary refill intact to all toes.  Motion is intact to all toes however with increased pain to fourth toe with range of motion.  Pedal pulses intact and equal bilaterally.  Assessment of range of motion somewhat limited secondary to body habitus however there is no pain with range of motion of the ankles or knees or hips bilaterally. Skin:  General: Skin is warm and dry.     Capillary Refill: Capillary refill takes less than 2 seconds.  Neurological:     Mental Status: She is alert.     GCS: GCS eye subscore is 4. GCS verbal subscore is 5. GCS motor subscore is 6.     Comments: Speech is clear and goal oriented, follows commands Major Cranial nerves without deficit, no facial droop Normal strength in upper and lower extremities bilaterally including dorsiflexion and plantar flexion, strong and equal grip strength Sensation normal to light touch Moves extremities without ataxia, coordination intact Normal gait for body habitus and toe pain  Psychiatric:        Behavior: Behavior normal.    ED Treatments / Results  Labs (all labs ordered are listed, but only abnormal results are displayed) Labs Reviewed - No data to display  EKG None  Radiology Dg Foot Complete Right  Result Date: 02/21/2019 CLINICAL DATA:  Struck door frame with fourth toe with pain and swelling, initial encounter EXAM: RIGHT FOOT COMPLETE - 3+ VIEW COMPARISON:  None. FINDINGS: Oblique fracture through the fourth proximal phalanx is noted with mild angulation and impaction at the fracture site. No other fractures are seen. Calcaneal spurring is noted. Soft tissue swelling is seen. IMPRESSION: Fourth proximal phalangeal fracture. Electronically Signed   By: Alcide CleverMark  Lukens M.D.   On: 02/21/2019 12:17    Procedures Procedures (including critical care time)  Medications Ordered in ED Medications  HYDROcodone-acetaminophen (NORCO/VICODIN) 5-325 MG per tablet 2 tablet (2 tablets Oral Given 02/21/19 1228)  amLODipine  (NORVASC) tablet 5 mg (5 mg Oral Given 02/21/19 1303)     Initial Impression / Assessment and Plan / ED Course  I have reviewed the triage vital signs and the nursing notes.  Pertinent labs & imaging results that were available during my care of the patient were reviewed by me and considered in my medical decision making (see chart for details).    43 year old female with history of hypertension, morbid obesity and GERD presents for right fourth toe pain.  Patient tripped over her cat today, stumbled and struck her toe on a door causing pain and swelling.  Patient denies any other injury.  She denies falling completely to the ground.  Denies head injury, loss consciousness, blood thinner use, chest/abdominal pain, neck/back pain or any other extremity injury.  She denies numbness/weakness or tingling.  Physical examination today shows some tenderness to the fourth toe with mild ecchymosis.  Bilateral lower extremities neurovascularly intact; no signs of infection, septic joint, DVT, compartment syndrome.  Somewhat decreased range of motion of the right fourth toe secondary to pain however patient with full ROM and 5/5 strength with all other movements.  Patient without sign of significant head, neck or back injury.  No sign of chest or abdominal injury.  DG right foot: IMPRESSION: Fourth proximal phalangeal fracture.  - Patient's toes were buddy taped today by nursing staff and she was placed in postop shoe.  She is ambulatory and does not want crutches today.  She has been given orthopedic referral regarding her toe fracture and I encouraged to follow-up with them this week.  Discussed rice protocol for pain.  Patient does not want pain medication here in the department. - Additionally patient noted to be hypertensive in the emergency department today.  Patient is currently asymptomatic with no signs or symptoms to suggest end organ damage. No chest pain, diaphoresis, nausea or other ACS symptoms.  No headache or neurologic  complaints. No reported change in urine output. Normal cardiopulmonary exam. Intact and equal distal pulses.  Patient has not followed up with a PCP in approximately 3 years and has not taken blood pressure medication in that time.  Patient case and blood pressures discussed with Dr. Madilyn Hook who advises beginning patient on 5 mg amlodipine daily and having her follow-up with PCP, no indication for further work-up at this time. Evaluation does not show pathology that would require ongoing emergent intervention or inpatient treatment.  I have stressed the importance of taking blood pressure medication as directed. Patient understands importance of close primary care provider follow up for further BP management.  Referrals given to local primary care providers. I instructed the patient to followup with their PCP within 1 week for BP check. I also counseled the patient regarding the signs and symptoms which would require an emergent visit to an emergency department for hypertensive urgency and/or hypertensive emergency. - At this time there does not appear to be any evidence of an acute emergency medical condition and the patient appears stable for discharge with appropriate outpatient follow up. Diagnosis was discussed with patient who verbalizes understanding of care plan and is agreeable to discharge. I have discussed return precautions with patient  who verbalize understanding of return precautions. Patient strongly encouraged to follow-up with their PCP and ortho. All questions answered.  Patient's case discussed with Dr. Madilyn Hook who agrees with plan to discharge with PCP follow-up.   Note: Portions of this report may have been transcribed using voice recognition software. Every effort was made to ensure accuracy; however, inadvertent computerized transcription errors may still be present. Final Clinical Impressions(s) / ED Diagnoses   Final diagnoses:  Closed nondisplaced fracture  of proximal phalanx of lesser toe of right foot, initial encounter  Elevated blood pressure reading    ED Discharge Orders         Ordered    valsartan (DIOVAN) 160 MG tablet  Daily,   Status:  Discontinued     02/21/19 1244    amLODipine (NORVASC) 5 MG tablet  Daily     02/21/19 1248           Bill Salinas, [place] 02/21/19 1347    Elizabeth Palau 02/21/19 1349    Tilden Fossa, MD 02/22/19 1122

## 2019-02-21 NOTE — ED Triage Notes (Signed)
Pt c/o pain and swelling in 4th toe of r/foot.Pt struck the door frame with r/foot this am. Reports hx of untreated HBP

## 2019-02-21 NOTE — Discharge Instructions (Signed)
You have been diagnosed today with Toe Fracture and Elevated Blood Pressure Reading.  At this time there does not appear to be the presence of an emergent medical condition, however there is always the potential for conditions to change. Please read and follow the below instructions.  Please return to the Emergency Department immediately for any new or worsening symptoms. Please be sure to follow up with your Primary Care Provider within one week regarding your visit today; please call their office to schedule an appointment even if you are feeling better for a follow-up visit. Please continue to buddy tape your toes to protect the fractured toe and use the postop shoe for comfort.  Use rest, ice and elevation to help with pain. Please call the orthopedic specialist Dr. Victorino Dike on your discharge paperwork on Monday to schedule follow-up appointment regarding your toe fracture. Your blood pressure was elevated today.  You have been started on Amlodipine today which is a blood pressure medication prescription however you must follow-up with your primary care provider this week for blood pressure recheck and medication management.  Uncontrolled high blood pressure can lead to severe diseases such as stroke, heart attack, kidney disease and death.  These take your blood pressure medication as previously prescribed and follow-up with your primary care provider.  You have been given referrals to [facility] health primary care at [facility] and [facility] health community health and wellness.  Get help right away if: You lose feeling (have numbness) in your toe or foot, and it is getting worse. Your toe or your foot tingles. Your toe or your foot gets cold or turns blue. You have redness or swelling in your toe or foot, and it is getting worse. You have very bad pain. Get help right away if: You develop a severe headache or confusion. You have unusual weakness or numbness. You feel faint. You have severe pain in  your chest or abdomen. You have vision changes You feel dizzy or strange You vomit repeatedly. You have trouble breathing. Any new or concerning symptoms.  Please read the additional information packets attached to your discharge summary.  Do not take your medicine if  develop an itchy rash, swelling in your mouth or lips, or difficulty breathing.

## 2019-03-24 DIAGNOSIS — I2699 Other pulmonary embolism without acute cor pulmonale: Secondary | ICD-10-CM | POA: Diagnosis present

## 2019-03-24 HISTORY — DX: Other pulmonary embolism without acute cor pulmonale: I26.99

## 2019-03-29 ENCOUNTER — Inpatient Hospital Stay (HOSPITAL_COMMUNITY): Payer: 59

## 2019-03-29 ENCOUNTER — Encounter (HOSPITAL_COMMUNITY): Payer: Self-pay | Admitting: Emergency Medicine

## 2019-03-29 ENCOUNTER — Inpatient Hospital Stay (HOSPITAL_COMMUNITY)
Admission: EM | Admit: 2019-03-29 | Discharge: 2019-04-05 | DRG: 175 | Disposition: A | Discharge status: Home / Self Care | Payer: 59 | Attending: Internal Medicine | Admitting: Internal Medicine

## 2019-03-29 ENCOUNTER — Other Ambulatory Visit: Payer: Self-pay

## 2019-03-29 ENCOUNTER — Emergency Department (HOSPITAL_COMMUNITY): Payer: 59

## 2019-03-29 DIAGNOSIS — S7011XA Contusion of right thigh, initial encounter: Secondary | ICD-10-CM | POA: Diagnosis present

## 2019-03-29 DIAGNOSIS — Z975 Presence of (intrauterine) contraceptive device: Secondary | ICD-10-CM | POA: Diagnosis not present

## 2019-03-29 DIAGNOSIS — F419 Anxiety disorder, unspecified: Secondary | ICD-10-CM | POA: Diagnosis present

## 2019-03-29 DIAGNOSIS — K219 Gastro-esophageal reflux disease without esophagitis: Secondary | ICD-10-CM | POA: Diagnosis present

## 2019-03-29 DIAGNOSIS — I2699 Other pulmonary embolism without acute cor pulmonale: Principal | ICD-10-CM | POA: Diagnosis present

## 2019-03-29 DIAGNOSIS — J9690 Respiratory failure, unspecified, unspecified whether with hypoxia or hypercapnia: Secondary | ICD-10-CM

## 2019-03-29 DIAGNOSIS — Z8249 Family history of ischemic heart disease and other diseases of the circulatory system: Secondary | ICD-10-CM | POA: Diagnosis not present

## 2019-03-29 DIAGNOSIS — D72829 Elevated white blood cell count, unspecified: Secondary | ICD-10-CM | POA: Diagnosis present

## 2019-03-29 DIAGNOSIS — F329 Major depressive disorder, single episode, unspecified: Secondary | ICD-10-CM | POA: Diagnosis present

## 2019-03-29 DIAGNOSIS — I10 Essential (primary) hypertension: Secondary | ICD-10-CM | POA: Diagnosis present

## 2019-03-29 DIAGNOSIS — I371 Nonrheumatic pulmonary valve insufficiency: Secondary | ICD-10-CM | POA: Diagnosis present

## 2019-03-29 DIAGNOSIS — X58XXXA Exposure to other specified factors, initial encounter: Secondary | ICD-10-CM | POA: Diagnosis present

## 2019-03-29 DIAGNOSIS — Z9109 Other allergy status, other than to drugs and biological substances: Secondary | ICD-10-CM | POA: Diagnosis not present

## 2019-03-29 DIAGNOSIS — J9601 Acute respiratory failure with hypoxia: Secondary | ICD-10-CM | POA: Diagnosis present

## 2019-03-29 DIAGNOSIS — Z86711 Personal history of pulmonary embolism: Secondary | ICD-10-CM | POA: Diagnosis present

## 2019-03-29 DIAGNOSIS — E876 Hypokalemia: Secondary | ICD-10-CM | POA: Diagnosis present

## 2019-03-29 DIAGNOSIS — G4730 Sleep apnea, unspecified: Secondary | ICD-10-CM | POA: Diagnosis present

## 2019-03-29 DIAGNOSIS — Z6841 Body Mass Index (BMI) 40.0 and over, adult: Secondary | ICD-10-CM | POA: Diagnosis not present

## 2019-03-29 DIAGNOSIS — I361 Nonrheumatic tricuspid (valve) insufficiency: Secondary | ICD-10-CM

## 2019-03-29 DIAGNOSIS — G43909 Migraine, unspecified, not intractable, without status migrainosus: Secondary | ICD-10-CM | POA: Diagnosis present

## 2019-03-29 DIAGNOSIS — Z79899 Other long term (current) drug therapy: Secondary | ICD-10-CM

## 2019-03-29 DIAGNOSIS — I272 Pulmonary hypertension, unspecified: Secondary | ICD-10-CM | POA: Diagnosis present

## 2019-03-29 DIAGNOSIS — R0600 Dyspnea, unspecified: Secondary | ICD-10-CM | POA: Diagnosis present

## 2019-03-29 HISTORY — PX: IR US GUIDE VASC ACCESS RIGHT: IMG2390

## 2019-03-29 HISTORY — PX: IR INFUSION THROMBOL ARTERIAL INITIAL (MS): IMG5376

## 2019-03-29 HISTORY — DX: Other pulmonary embolism without acute cor pulmonale: I26.99

## 2019-03-29 LAB — COMPREHENSIVE METABOLIC PANEL
ALT: 19 U/L (ref 0–44)
AST: 29 U/L (ref 15–41)
Albumin: 3.6 g/dL (ref 3.5–5.0)
Alkaline Phosphatase: 87 U/L (ref 38–126)
Anion gap: 6 (ref 5–15)
BUN: 16 mg/dL (ref 6–20)
CO2: 21 mmol/L — ABNORMAL LOW (ref 22–32)
Calcium: 8.5 mg/dL — ABNORMAL LOW (ref 8.9–10.3)
Chloride: 104 mmol/L (ref 98–111)
Creatinine, Ser: 1.08 mg/dL — ABNORMAL HIGH (ref 0.44–1.00)
GFR calc Af Amer: >60 mL/min (ref >60)
GFR calc non Af Amer: >60 mL/min (ref >60)
Glucose, Bld: 170 mg/dL — ABNORMAL HIGH (ref 70–99)
Potassium: 3.5 mmol/L (ref 3.5–5.1)
Sodium: 131 mmol/L — ABNORMAL LOW (ref 135–145)
Total Bilirubin: 1.4 mg/dL — ABNORMAL HIGH (ref 0.3–1.2)
Total Protein: 8.5 g/dL — ABNORMAL HIGH (ref 6.5–8.1)

## 2019-03-29 LAB — CBC
HCT: 43.2 % (ref 36.0–46.0)
HCT: 39.5 % (ref 36.0–46.0)
HCT: 38.3 % (ref 36.0–46.0)
Hemoglobin: 13.9 g/dL (ref 12.0–15.0)
Hemoglobin: 13 g/dL (ref 12.0–15.0)
Hemoglobin: 12.3 g/dL (ref 12.0–15.0)
MCH: 29.4 pg (ref 26.0–34.0)
MCH: 28.8 pg (ref 26.0–34.0)
MCH: 28.5 pg (ref 26.0–34.0)
MCHC: 32.2 g/dL (ref 30.0–36.0)
MCHC: 32.9 g/dL (ref 30.0–36.0)
MCHC: 32.1 g/dL (ref 30.0–36.0)
MCV: 91.3 fL (ref 80.0–100.0)
MCV: 87.6 fL (ref 80.0–100.0)
MCV: 88.7 fL (ref 80.0–100.0)
Platelets: 278 10*3/uL (ref 150–400)
Platelets: 245 10*3/uL (ref 150–400)
Platelets: 233 10*3/uL (ref 150–400)
RBC: 4.73 MIL/uL (ref 3.87–5.11)
RBC: 4.51 MIL/uL (ref 3.87–5.11)
RBC: 4.32 MIL/uL (ref 3.87–5.11)
RDW: 13.9 % (ref 11.5–15.5)
RDW: 14 % (ref 11.5–15.5)
RDW: 13.9 % (ref 11.5–15.5)
WBC: 16.7 10*3/uL — ABNORMAL HIGH (ref 4.0–10.5)
WBC: 16.4 10*3/uL — ABNORMAL HIGH (ref 4.0–10.5)
WBC: 16.9 10*3/uL — ABNORMAL HIGH (ref 4.0–10.5)
nRBC: 0 % (ref 0.0–0.2)
nRBC: 0 % (ref 0.0–0.2)
nRBC: 0 % (ref 0.0–0.2)

## 2019-03-29 LAB — I-STAT BETA HCG BLOOD, ED (MC, WL, AP ONLY): I-stat hCG, quantitative: <5 m[IU]/mL (ref <5)

## 2019-03-29 LAB — ECHOCARDIOGRAM COMPLETE
Height: 64 in
Weight: 7200 oz

## 2019-03-29 LAB — FIBRINOGEN
Fibrinogen: 511 mg/dL — ABNORMAL HIGH (ref 210–475)
Fibrinogen: 385 mg/dL (ref 210–475)

## 2019-03-29 LAB — MRSA PCR SCREENING: MRSA by PCR: NEGATIVE

## 2019-03-29 LAB — HEPARIN LEVEL (UNFRACTIONATED)
Heparin Unfractionated: <0.1 IU/mL — ABNORMAL LOW (ref 0.30–0.70)
Heparin Unfractionated: <0.1 IU/mL — ABNORMAL LOW (ref 0.30–0.70)

## 2019-03-29 LAB — PROTIME-INR
INR: 1.1 (ref 0.8–1.2)
Prothrombin Time: 13.8 seconds (ref 11.4–15.2)

## 2019-03-29 LAB — TROPONIN I: Troponin I: 0.76 ng/mL (ref <0.03)

## 2019-03-29 LAB — GLUCOSE, CAPILLARY: Glucose-Capillary: 165 mg/dL — ABNORMAL HIGH (ref 70–99)

## 2019-03-29 MED ORDER — SODIUM CHLORIDE 0.9 % IV SOLN
12.0000 mg | Freq: Once | INTRAVENOUS | Status: AC
  Administered 2019-03-29: 16:00:00 12 mg via INTRAVENOUS
  Filled 2019-03-29: qty 12

## 2019-03-29 MED ORDER — LIDOCAINE HCL 1 % IJ SOLN
INTRAMUSCULAR | Status: AC | PRN
  Administered 2019-03-29: 10 mL

## 2019-03-29 MED ORDER — FENTANYL CITRATE (PF) 100 MCG/2ML IJ SOLN
INTRAMUSCULAR | Status: AC | PRN
  Administered 2019-03-29: 50 ug via INTRAVENOUS

## 2019-03-29 MED ORDER — PANTOPRAZOLE SODIUM 40 MG PO TBEC
40.0000 mg | DELAYED_RELEASE_TABLET | Freq: Every day | ORAL | Status: AC
  Administered 2019-03-30 – 2019-03-31 (×2): 40 mg via ORAL
  Filled 2019-03-29 (×2): qty 1

## 2019-03-29 MED ORDER — SODIUM CHLORIDE 0.9 % IV SOLN: INTRAVENOUS | Status: DC

## 2019-03-29 MED ORDER — SODIUM CHLORIDE 0.9 % IV SOLN: 250.0000 mL | INTRAVENOUS | Status: DC | PRN

## 2019-03-29 MED ORDER — SODIUM CHLORIDE 0.9% FLUSH
3.0000 mL | Freq: Two times a day (BID) | INTRAVENOUS | Status: DC
  Administered 2019-03-29: 23:00:00 3 mL via INTRAVENOUS

## 2019-03-29 MED ORDER — MIDAZOLAM HCL 2 MG/2ML IJ SOLN
INTRAMUSCULAR | Status: AC | PRN
  Administered 2019-03-29: 1 mg via INTRAVENOUS

## 2019-03-29 MED ORDER — SODIUM CHLORIDE 0.9 % IV SOLN
INTRAVENOUS | Status: DC
  Administered 2019-03-30: 05:00:00 via INTRAVENOUS

## 2019-03-29 MED ORDER — ACETAMINOPHEN 325 MG PO TABS
650.0000 mg | ORAL_TABLET | ORAL | Status: DC | PRN
  Administered 2019-03-29 – 2019-04-05 (×10): 650 mg via ORAL
  Filled 2019-03-29 (×11): qty 2

## 2019-03-29 MED ORDER — IOHEXOL 350 MG/ML SOLN
100.0000 mL | Freq: Once | INTRAVENOUS | Status: AC | PRN
  Administered 2019-03-29: 12:00:00 100 mL via INTRAVENOUS

## 2019-03-29 MED ORDER — LACTATED RINGERS IV SOLN
INTRAVENOUS | Status: DC
  Administered 2019-03-29: 21:00:00 via INTRAVENOUS

## 2019-03-29 MED ORDER — LIDOCAINE HCL 1 % IJ SOLN
INTRAMUSCULAR | Status: AC
  Filled 2019-03-29: qty 20

## 2019-03-29 MED ORDER — ORAL CARE MOUTH RINSE
15.0000 mL | Freq: Two times a day (BID) | OROMUCOSAL | Status: DC
  Administered 2019-03-29 – 2019-04-05 (×7): 15 mL via OROMUCOSAL

## 2019-03-29 MED ORDER — MIDAZOLAM HCL 2 MG/2ML IJ SOLN
INTRAMUSCULAR | Status: AC
  Filled 2019-03-29: qty 2

## 2019-03-29 MED ORDER — FENTANYL CITRATE (PF) 100 MCG/2ML IJ SOLN
INTRAMUSCULAR | Status: AC
  Filled 2019-03-29: qty 2

## 2019-03-29 MED ORDER — HEPARIN BOLUS VIA INFUSION
4000.0000 [IU] | Freq: Once | INTRAVENOUS | Status: AC
  Administered 2019-03-29: 13:00:00 4000 [IU] via INTRAVENOUS
  Filled 2019-03-29: qty 4000

## 2019-03-29 MED ORDER — PERFLUTREN LIPID MICROSPHERE
1.0000 mL | INTRAVENOUS | Status: AC | PRN
  Administered 2019-03-29: 14:00:00 3 mL via INTRAVENOUS
  Filled 2019-03-29: qty 10

## 2019-03-29 MED ORDER — HEPARIN (PORCINE) 25000 UT/250ML-% IV SOLN
2750.0000 [IU]/h | INTRAVENOUS | Status: DC
  Administered 2019-03-29: 1450 [IU]/h via INTRAVENOUS
  Administered 2019-03-30: 02:00:00 2000 [IU]/h via INTRAVENOUS
  Administered 2019-03-31 – 2019-04-03 (×4): 2500 [IU]/h via INTRAVENOUS
  Administered 2019-04-04 (×2): 2750 [IU]/h via INTRAVENOUS
  Filled 2019-03-29 (×16): qty 250

## 2019-03-29 MED ORDER — SODIUM CHLORIDE 0.9% FLUSH: 3.0000 mL | INTRAVENOUS | Status: DC | PRN

## 2019-03-29 MED ORDER — IOHEXOL 300 MG/ML  SOLN: 300.0000 mL | Freq: Once | INTRAMUSCULAR | Status: DC | PRN

## 2019-03-29 NOTE — ED Notes (Signed)
Patient transported to CT 

## 2019-03-29 NOTE — ED Provider Notes (Signed)
[facility] EMERGENCY DEPARTMENT Provider Note   CSN: [id number] Arrival date & time: 03/29/19  6578    History   Chief Complaint No chief complaint on file.   HPI Susan Warren is a 43 y.o. female with a past medical history of hypertension, anxiety, morbid obesity who presents to ED for acute onset shortness of breath and palpitations for the past 4 hours.  She was working all night and was in her usual state of health.  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  She initially thought the symptoms are due to anxiety but states that "they just did not go away and usually they will affect my anxiety."  She is unsure if her right leg has felt more swollen over the past few days.  She does not wear supplemental oxygen at home at baseline.  Denies any history of lung disease or tobacco use.  She denies any chest pain, cough, other URI symptoms, fever, sick contacts, recent travel or exposure to someone who tested positive for COVID-19.  Denies any history of DVT, PE, abdominal pain, hemoptysis, OCP use.     HPI  Past Medical History:  Diagnosis Date  . Allergy   . Anxiety   . Depression   . GERD (gastroesophageal reflux disease)   . Hypertension   . Migraines   . Sleep apnea     Patient Active Problem List   Diagnosis Date Noted  . Angioedema 04/07/2015  . Preventative health care 01/25/2014  . Hypertension   . Depression   . GERD (gastroesophageal reflux disease)   . Allergy   . Migraines   . Anxiety   . IUD (intrauterine device) in place 03/10/2013    Past Surgical History:  Procedure Laterality Date  . CHOLECYSTECTOMY       OB History    Gravida  1   Para  1   Term  1   Preterm  0   AB  0   Living  1     SAB  0   TAB  0   Ectopic  0   Multiple  0   Live Births               Home Medications    Prior to Admission medications   Medication Sig Start Date End Date Taking? Authorizing Provider  amLODipine (NORVASC) 5 MG tablet Take 1  tablet (5 mg total) by mouth daily. 02/21/19  Yes Bill Salinas, PA-C    Family History Family History  Problem Relation Age of Onset  . Hypertension Mother   . Thyroid disease Mother   . Hypertension Maternal Grandmother   . Diabetes Maternal Grandmother   . Breast cancer Maternal Grandmother   . Stroke Maternal Grandmother   . Heart attack Neg Hx     Social History Social History   Tobacco Use  . Smoking status: Never Smoker  . Smokeless tobacco: Never Used  Substance Use Topics  . Alcohol use: Yes    Comment: occ  . Drug use: No     Allergies   Adhesive [tape]   Review of Systems Review of Systems  Constitutional: Negative for appetite change, chills and fever.  HENT: Negative for ear pain, rhinorrhea, sneezing and sore throat.   Eyes: Negative for photophobia and visual disturbance.  Respiratory: Positive for shortness of breath. Negative for cough, chest tightness and wheezing.   Cardiovascular: Positive for palpitations and leg swelling. Negative for chest pain.  Gastrointestinal:  Negative for abdominal pain, blood in stool, constipation, diarrhea, nausea and vomiting.  Genitourinary: Negative for dysuria, hematuria and urgency.  Musculoskeletal: Negative for myalgias.  Skin: Negative for rash.  Neurological: Negative for dizziness, weakness and light-headedness.     Physical Exam Updated Vital Signs BP 119/87   Pulse (!) 128   Temp 99 F (37.2 C) (Oral)   Resp (!) 22   Ht 5\' 4"  (1.626 m)   Wt (!) 204.1 kg   SpO2 97%   BMI 77.24 kg/m   Physical Exam Vitals signs and nursing note reviewed.  Constitutional:      General: She is not in acute distress.    Appearance: She is well-developed. She is obese.  HENT:     Head: Normocephalic and atraumatic.     Nose: Nose normal.  Eyes:     General: No scleral icterus.       Left eye: No discharge.     Conjunctiva/sclera: Conjunctivae normal.  Neck:     Musculoskeletal: Normal range of motion and  neck supple.  Cardiovascular:     Rate and Rhythm: Regular rhythm. Tachycardia present.     Heart sounds: Normal heart sounds. No murmur. No friction rub. No gallop.   Pulmonary:     Effort: Pulmonary effort is normal. Tachypnea present. No respiratory distress.     Breath sounds: Normal breath sounds.     Comments: Oxygen being delivered via NRB. Abdominal:     General: Bowel sounds are normal. There is no distension.     Palpations: Abdomen is soft.     Tenderness: There is no abdominal tenderness. There is no guarding.  Musculoskeletal: Normal range of motion.  Skin:    General: Skin is warm and dry.     Findings: No rash.     Comments: Lower extremities without pitting edema bilaterally.  Neurological:     Mental Status: She is alert.     Motor: No abnormal muscle tone.     Coordination: Coordination normal.      ED Treatments / Results  Labs (all labs ordered are listed, but only abnormal results are displayed) Labs Reviewed  COMPREHENSIVE METABOLIC PANEL - Abnormal; Notable for the following components:      Result Value   Sodium 131 (*)    CO2 21 (*)    Glucose, Bld 170 (*)    Creatinine, Ser 1.08 (*)    Calcium 8.5 (*)    Total Protein 8.5 (*)    Total Bilirubin 1.4 (*)    All other components within normal limits  CBC - Abnormal; Notable for the following components:   WBC 16.7 (*)    All other components within normal limits  TROPONIN I - Abnormal; Notable for the following components:   Troponin I 0.76 (*)    All other components within normal limits  HEPARIN LEVEL (UNFRACTIONATED)  I-STAT BETA HCG BLOOD, ED ([facility], [facility], [facility] ONLY)    EKG EKG Interpretation  Date/Time:  Monday March 29 2019 09:45:01 EDT Ventricular Rate:  134 PR Interval:    QRS Duration: 102 QT Interval:  305 QTC Calculation: 456 R Axis:   77 Text Interpretation:  Sinus tachycardia Since last tracing rate faster Confirmed by Linwood Dibbles [phone]) on 03/29/2019 10:39:49 AM   Radiology Ct  Angio Chest Pe W/cm &/or Wo Cm  Result Date: 03/29/2019 CLINICAL DATA:  Shortness of breath. EXAM: CT ANGIOGRAPHY CHEST WITH CONTRAST TECHNIQUE: Multidetector CT imaging of the chest was performed using the  standard protocol during bolus administration of intravenous contrast. Multiplanar CT image reconstructions and MIPs were obtained to evaluate the vascular anatomy. CONTRAST:  OMNIPAQUE IOHEXOL 350 MG/ML SOLN COMPARISON:  Chest x-ray dated 03/29/2019 and CT scan dated 02/05/2017 FINDINGS: Cardiovascular: There are massive bilateral pulmonary emboli. There is almost complete obstruction of the pulmonary arteries in the left lung with a tiny amount of flow into the left upper lobe. There is only minimal flow into the pulmonary arteries of the right lung due to the massive emboli. RV/LV ratio is 2.6 indicating marked right heart strain. Mediastinum/Nodes: No enlarged mediastinal, hilar, or axillary lymph nodes. Thyroid gland, trachea, and esophagus demonstrate no significant findings. Lungs/Pleura: Lungs are clear. No pleural effusion or pneumothorax. Upper Abdomen: Normal. Musculoskeletal: No chest wall abnormality. No acute or significant osseous findings. Review of the MIP images confirms the above findings. IMPRESSION: 1. Massive bilateral pulmonary emboli with minimal perfusion to both lungs. 2. Markedly abnormal RV/LV ratio of 2.6. Critical Value/emergent results were called by telephone at the time of interpretation on 03/29/2019 at 12:30 pm to [facility], PA-C , who verbally acknowledged these results. Electronically Signed   By: Francene Boyers M.D.   On: 03/29/2019 12:32   Dg Chest Portable 1 View  Result Date: 03/29/2019 CLINICAL DATA:  Onset shortness of breath and tachycardia 3 hours ago. EXAM: PORTABLE CHEST 1 VIEW COMPARISON:  CT chest and PA and lateral chest 02/05/2017. FINDINGS: The lungs are clear. Heart size is normal. No pneumothorax or pleural fluid. No acute or focal bony abnormality.  IMPRESSION: Negative chest. Electronically Signed   By: Drusilla Kanner M.D.   On: 03/29/2019 10:32    Procedures .Critical Care Performed by: Dietrich Pates, PA-C Authorized by: Dietrich Pates, PA-C   Critical care provider statement:    Critical care time (minutes):  45   Critical care time was exclusive of:  Separately billable procedures and treating other patients   Critical care was necessary to treat or prevent imminent or life-threatening deterioration of the following conditions:  Respiratory failure (Pulmonary emboli)   Critical care was time spent personally by me on the following activities:  Discussions with consultants, evaluation of patient's response to treatment, re-evaluation of patient's condition, pulse oximetry, ordering and review of radiographic studies, ordering and review of laboratory studies, ordering and performing treatments and interventions, development of treatment plan with patient or surrogate and obtaining history from patient or surrogate   I assumed direction of critical care for this patient from another provider in my specialty: no     (including critical care time)   Medications Ordered in ED Medications  heparin ADULT infusion 100 units/mL (25000 units/221mL sodium chloride 0.45%) (1,450 Units/hr Intravenous New Bag/Given 03/29/19 1243)  iohexol (OMNIPAQUE) 350 MG/ML injection 100 mL (100 mLs Intravenous Contrast Given 03/29/19 1202)  heparin bolus via infusion 4,000 Units (4,000 Units Intravenous Bolus from Bag 03/29/19 1243)     Initial Impression / Assessment and Plan / ED Course  I have reviewed the triage vital signs and the nursing notes.  Pertinent labs & imaging results that were available during my care of the patient were reviewed by me and considered in my medical decision making (see chart for details).        43 year old female past medical history of hypertension, anxiety, morbid obesity presents to ED for acute onset of shortness of  breath and palpitations 4 hours prior to arrival.  She does not wear oxygen at baseline.  Patient tachycardic  to 130s, tachypneic and hypoxic to the low 90s.  She is on nonrebreather and appears comfortable with this. She is morbidly obese to unable to get a clear visualization of if there is lower extremity edema, but appears nonpitting with no calf tenderness.  Lab work significant for leukocytosis of 16, troponin of 0.76, hCG negative.  EKG shows sinus tachycardia.  Chest x-ray is unremarkable.  CT a of the chest shows massive bilateral pulmonary emboli with strain.  She was given heparin, will consult to intensivist for further management.  They will come evaluate the patient in the ED.    Portions of this note were generated with Scientist, clinical (histocompatibility and immunogenetics). Dictation errors may occur despite best attempts at proofreading.  Final Clinical Impressions(s) / ED Diagnoses   Final diagnoses:  Other acute pulmonary embolism, unspecified whether acute cor pulmonale present [facility])    ED Discharge Orders    None       Dietrich Pates, PA-C 03/29/19 1308    Linwood Dibbles, MD 03/31/19 [phone]

## 2019-03-29 NOTE — Progress Notes (Signed)
ANTICOAGULATION CONSULT NOTE  Pharmacy Consult for heparin Indication: chest pain/ACS  Heparin Dosing Weight: 109.1 kg  Labs: Recent Labs    03/29/19 1008  HGB 13.9  HCT 43.2  PLT 278  CREATININE 1.08*  TROPONINI 0.76*    Assessment: 43 yof presenting with CP. Pharmacy consulted to dose heparin for ACS. Not on anticoagulation PTA. CBC wnl. No active bleed issues documented.  Goal of Therapy:  Heparin level 0.3-0.7 units/ml Monitor platelets by anticoagulation protocol: Yes   Plan:  Heparin 4000 unit bolus Start heparin at 1500 units/h 6h heparin level Daily heparin level/CBC Monitor s/sx bleeding F/u Cardiology plans  Susan Warren, PharmD, BCPS Clinical Pharmacist 03/29/2019 12:20 PM

## 2019-03-29 NOTE — ED Notes (Signed)
Pt restful while ECHO being completed.   Pure wick placed for output.

## 2019-03-29 NOTE — ED Notes (Addendum)
ED TO INPATIENT HANDOFF REPORT  ED Nurse Name and Phone #:  Michele/Cindy  S Name/Age/Gender Susan Warren 43 y.o. female Room/Bed: 021C/021C  Code Status   Code Status: Full Code  Home/SNF/Other Home Patient oriented to: self, place, time and situation Is this baseline? Yes   Triage Complete: Triage complete  Chief Complaint Sob  Triage Note Per EMS: Pt from home with C/O sob and tachycardiac that began about 3 hours PTA.  Pt denies any fevers or sic contacts.  Pt does not currently use home 02.   EMS vitals:  BP 150/88 HR 130's 91% on RA 100% on non rebreather 10L CBG 294    Allergies Allergies  Allergen Reactions  . Adhesive [Tape] Anaphylaxis and Swelling    *Adhesive Spray*     Level of Care/Admitting Diagnosis ED Disposition    ED Disposition Condition Comment   Admit  [facility] [[id number]]  Level of Care: ICU [6]  Diagnosis: Acute massive pulmonary embolism [facility](HCC) [[id number]]) [[id number]]  Admitting Physician: Merwyn KatosSIMONDS, DAVID B [email or web address][[id number]]  Attending Physician: Sung AmabileSIMONDS, DAVID B [[id number]]  Estimated length of stay: 5 - 7 days  Certification:: I certify this patient will need inpatient services for at least 2 midnights  PT Class (Do Not Modify): Inpatient [101]  PT Acc Code (Do Not Modify): Private [1]       B Medical/Surgery History Past Medical History:  Diagnosis Date  . Allergy   . Anxiety   . Depression   . GERD (gastroesophageal reflux disease)   . Hypertension   . Migraines   . Sleep apnea    Past Surgical History:  Procedure Laterality Date  . CHOLECYSTECTOMY       A IV Location/Drains/Wounds Patient Lines/Drains/Airways Status   Active Line/Drains/Airways    Name:   Placement date:   Placement time:   Site:   Days:   Peripheral IV 03/29/19 Left Antecubital   03/29/19    0953    Antecubital   less than 1          Intake/Output Last 24 hours No intake or output data in the 24 hours ending 03/29/19  1335  Labs/Imaging Results for orders placed or performed during the hospital encounter of 03/29/19 (from the past 48 hour(s))  Comprehensive metabolic panel     Status: Abnormal   Collection Time: 03/29/19 10:08 AM  Result Value Ref Range   Sodium 131 (L) 135 - 145 mmol/L   Potassium 3.5 3.5 - 5.1 mmol/L   Chloride 104 98 - 111 mmol/L   CO2 21 (L) 22 - 32 mmol/L   Glucose, Bld 170 (H) 70 - 99 mg/dL   BUN 16 6 - 20 mg/dL   Creatinine, Ser 0.451.08 (H) 0.44 - 1.00 mg/dL   Calcium 8.5 (L) 8.9 - 10.3 mg/dL   Total Protein 8.5 (H) 6.5 - 8.1 g/dL   Albumin 3.6 3.5 - 5.0 g/dL   AST 29 15 - 41 U/L   ALT 19 0 - 44 U/L   Alkaline Phosphatase 87 38 - 126 U/L   Total Bilirubin 1.4 (H) 0.3 - 1.2 mg/dL   GFR calc non Af Amer >60 >60 mL/min   GFR calc Af Amer >60 >60 mL/min   Anion gap 6 5 - 15    Comment: Performed at [facility], [address] [place], [place] [id number]  CBC     Status: Abnormal   Collection Time: 03/29/19 10:08 AM  Result Value Ref  Range   WBC 16.7 (H) 4.0 - 10.5 K/uL   RBC 4.73 3.87 - 5.11 MIL/uL   Hemoglobin 13.9 12.0 - 15.0 g/dL   HCT 82.9 93.7 - 16.9 %   MCV 91.3 80.0 - 100.0 fL   MCH 29.4 26.0 - 34.0 pg   MCHC 32.2 30.0 - 36.0 g/dL   RDW 67.8 93.8 - 10.1 %   Platelets 278 150 - 400 K/uL   nRBC 0.0 0.0 - 0.2 %    Comment: Performed at [facility], [address], [place], [place] [address]  Troponin I - ONCE - STAT     Status: Abnormal   Collection Time: 03/29/19 10:08 AM  Result Value Ref Range   Troponin I 0.76 (HH) <0.03 ng/mL    Comment: CRITICAL RESULT CALLED TO, READ BACK BY AND VERIFIED WITH: [facility] RN @ 1110 03/29/19 LEONARD,A Performed at [facility], [address] [place], [place] [address]   I-Stat beta hCG blood, ED     Status: None   Collection Time: 03/29/19 10:26 AM  Result Value Ref Range   I-stat hCG, quantitative <5.0 <5 mIU/mL   Comment 3            Comment:   GEST. AGE      CONC.  (mIU/mL)   <=1 WEEK        5 -  50     2 WEEKS       50 - 500     3 WEEKS       100 - 10,000     4 WEEKS     1,000 - 30,000        FEMALE AND NON-PREGNANT FEMALE:     LESS THAN 5 mIU/mL    Ct Angio Chest Pe W/cm &/or Wo Cm  Result Date: 03/29/2019 CLINICAL DATA:  Shortness of breath. EXAM: CT ANGIOGRAPHY CHEST WITH CONTRAST TECHNIQUE: Multidetector CT imaging of the chest was performed using the standard protocol during bolus administration of intravenous contrast. Multiplanar CT image reconstructions and MIPs were obtained to evaluate the vascular anatomy. CONTRAST:  OMNIPAQUE IOHEXOL 350 MG/ML SOLN COMPARISON:  Chest x-ray dated 03/29/2019 and CT scan dated 02/05/2017 FINDINGS: Cardiovascular: There are massive bilateral pulmonary emboli. There is almost complete obstruction of the pulmonary arteries in the left lung with a tiny amount of flow into the left upper lobe. There is only minimal flow into the pulmonary arteries of the right lung due to the massive emboli. RV/LV ratio is 2.6 indicating marked right heart strain. Mediastinum/Nodes: No enlarged mediastinal, hilar, or axillary lymph nodes. Thyroid gland, trachea, and esophagus demonstrate no significant findings. Lungs/Pleura: Lungs are clear. No pleural effusion or pneumothorax. Upper Abdomen: Normal. Musculoskeletal: No chest wall abnormality. No acute or significant osseous findings. Review of the MIP images confirms the above findings. IMPRESSION: 1. Massive bilateral pulmonary emboli with minimal perfusion to both lungs. 2. Markedly abnormal RV/LV ratio of 2.6. Critical Value/emergent results were called by telephone at the time of interpretation on 03/29/2019 at 12:30 pm to [facility], PA-C , who verbally acknowledged these results. Electronically Signed   By: Francene Boyers M.D.   On: 03/29/2019 12:32   Dg Chest Portable 1 View  Result Date: 03/29/2019 CLINICAL DATA:  Onset shortness of breath and tachycardia 3 hours ago. EXAM: PORTABLE CHEST 1 VIEW COMPARISON:  CT  chest and PA and lateral chest 02/05/2017. FINDINGS: The lungs are clear. Heart size is normal. No pneumothorax  or pleural fluid. No acute or focal bony abnormality. IMPRESSION: Negative chest. Electronically Signed   By: Drusilla Kanner M.D.   On: 03/29/2019 10:32    Pending Labs Unresulted Labs (From admission, onward)    Start     Ordered   03/30/19 0500  Heparin level (unfractionated)  Daily,   R     03/29/19 1225   03/30/19 0500  CBC  Daily,   R     03/29/19 1225   03/30/19 0500  Basic metabolic panel  Tomorrow morning,   R     03/29/19 1320   03/30/19 0500  CBC  Tomorrow morning,   R     03/29/19 1320   03/30/19 0500  TSH  Tomorrow morning,   R     03/29/19 1332   03/29/19 1930  Heparin level (unfractionated)  Once-Timed,   R     03/29/19 1225          Vitals/Pain Today's Vitals   03/29/19 1030 03/29/19 1045 03/29/19 1246 03/29/19 1300  BP: 102/82 116/78 119/87 (!) 144/114  Pulse: (!) 127 (!) 126 (!) 128 (!) 127  Resp: (!) 27 (!) 23 (!) 22 (!) 30  Temp:      TempSrc:      SpO2: 98% 97% 97% 98%  Weight:      Height:      PainSc:        Isolation Precautions No active isolations  Medications Medications  heparin ADULT infusion 100 units/mL (25000 units/260mL sodium chloride 0.45%) (1,450 Units/hr Intravenous New Bag/Given 03/29/19 1243)  lactated ringers infusion (has no administration in time range)  acetaminophen (TYLENOL) tablet 650 mg (has no administration in time range)  pantoprazole (PROTONIX) EC tablet 40 mg (has no administration in time range)  iohexol (OMNIPAQUE) 350 MG/ML injection 100 mL (100 mLs Intravenous Contrast Given 03/29/19 1202)  heparin bolus via infusion 4,000 Units (4,000 Units Intravenous Bolus from Bag 03/29/19 1243)    Mobility walks with device Moderate fall risk   Focused Assessments Pulmonary Assessment Handoff:  Lung sounds: L Breath Sounds: Diminished R Breath Sounds: Diminished O2 Device: NRB O2 Flow Rate (L/min): 10  L/min      R Recommendations: See Admitting Provider Note  Report given to:   Additional Notes:  Patient presented to the emergency with shortness of breath.  She was noted to be tachycardic on exam.  Her presentation was concerning for the possibility of pulmonary embolism.  Her CT scan does show bilateral pulmonary embolism with large clot burden.  Patient is showing evidence of right heart strain with persistent tachycardia and elevated troponin.

## 2019-03-29 NOTE — ED Provider Notes (Signed)
Medical screening examination/treatment/procedure(s) were conducted as a shared visit with non-physician practitioner(s) and myself.  I personally evaluated the patient during the encounter.  EKG Interpretation  Date/Time:  Monday March 29 2019 09:45:01 EDT Ventricular Rate:  134 PR Interval:    QRS Duration: 102 QT Interval:  305 QTC Calculation: 456 R Axis:   77 Text Interpretation:  Sinus tachycardia Since last tracing rate faster Confirmed by Linwood Dibbles [phone]) on 03/29/2019 10:39:49 AM  Patient presented to the emergency with shortness of breath.  She was noted to be tachycardic on exam.  Her presentation was concerning for the possibility of pulmonary embolism.  Her CT scan does feel bilateral pulmonary embolism with large clot burden.  Patient is showing evidence of right heart strain with persistent tachycardia and elevated troponin.  Critical care was consulted.  Patient has been started on heparin but she may be a candidate for thrombolytics.  .Critical Care Performed by: Linwood Dibbles, MD Authorized by: Linwood Dibbles, MD   Critical care provider statement:    Critical care time (minutes):  30   Critical care was time spent personally by me on the following activities:  Discussions with consultants, evaluation of patient's response to treatment, examination of patient, ordering and performing treatments and interventions, ordering and review of laboratory studies, ordering and review of radiographic studies, pulse oximetry, re-evaluation of patient's condition, obtaining history from patient or surrogate and review of old charts      Linwood Dibbles, MD 03/29/19 1303

## 2019-03-29 NOTE — Plan of Care (Signed)
  Problem: Education: Goal: Knowledge of General Education information will improve Description: Including pain rating scale, medication(s)/side effects and non-pharmacologic comfort measures Outcome: Progressing   Problem: Health Behavior/Discharge Planning: Goal: Ability to manage health-related needs will improve Outcome: Progressing   Problem: Clinical Measurements: Goal: Ability to maintain clinical measurements within normal limits will improve Outcome: Progressing Goal: Will remain free from infection Outcome: Progressing Goal: Diagnostic test results will improve Outcome: Progressing Goal: Respiratory complications will improve Outcome: Progressing Goal: Cardiovascular complication will be avoided Outcome: Progressing   Problem: Nutrition: Goal: Adequate nutrition will be maintained Outcome: Progressing   Problem: Pain Managment: Goal: General experience of comfort will improve Outcome: Progressing   Problem: Safety: Goal: Ability to remain free from injury will improve Outcome: Progressing   Problem: Skin Integrity: Goal: Risk for impaired skin integrity will decrease Outcome: Progressing   

## 2019-03-29 NOTE — Progress Notes (Signed)
  Echocardiogram 2D Echocardiogram with definity has been performed.  Leta Jungling M 03/29/2019, 2:41 PM

## 2019-03-29 NOTE — ED Notes (Signed)
Verified Heparin with Sabino Gasser.

## 2019-03-29 NOTE — H&P (Signed)
PULMONARY/CCM CONSULT NOTE  Requesting MD/Service: [facility] Date of initial consultation: 03/29/19 Reason for consultation:  Acute hypoxemic respiratory failure, massive acute PE  PT PROFILE: 43 y.o. 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 Hx of hypertension. Never hypotensive but has persistent sinus tachycardia (130/min)  DATA: 03/29/19 CTA chest: Massive bilateral pulmonary emboli with minimal perfusion to both lungs.  RV/LV ratio of 2.6  INTERVAL:  HPI:  As above. She was in USOH on the day prior to this admission. She was ambulating this AM shortly after awakening and developed sudden onset of severe dyspnea and a sense of "panic". Presented to ED shortly after that and was noted to be hypoxemic requiring supplemental O2. She has no prior history of VTE. There is no personal or family of bleeding diatheses. She has no history of recent surgery or trauma and specifically no injuries to her head. She is not presently sexually active and has an IUD in place. Denies LOC, CP, fever, purulent sputum, hemoptysis and calf tenderness. She has chronic, severe LE edema  Past Medical History:  Diagnosis Date  . Allergy   . Anxiety   . Depression   . GERD (gastroesophageal reflux disease)   . Hypertension   . Migraines   . Sleep apnea     Past Surgical History:  Procedure Laterality Date  . CHOLECYSTECTOMY      MEDICATIONS: I have reviewed all medications and confirmed regimen as documented  Social History   Socioeconomic History  . Marital status: Married    Spouse name: Not on file  . Number of children: Not on file  . Years of education: 1712  . Highest education level: Not on file  Occupational History  . Occupation: Admin.    Employer: [employer]  Social Needs  . Financial resource strain: Not on file  . Food insecurity:    Worry: Not on file    Inability: Not on file  . Transportation needs:    Medical: Not on file    Non-medical: Not on file  Tobacco Use  . Smoking status: Never Smoker  . Smokeless tobacco: Never Used  Substance and Sexual Activity  . Alcohol use: Yes    Comment: occ  . Drug use: No  . Sexual activity: Not Currently    Birth control/protection: I.U.D.  Lifestyle  . Physical activity:    Days per week: Not on file    Minutes per session: Not on file  . Stress: Not on file  Relationships  . Social connections:    Talks on phone: Not on file    Gets together: Not on file    Attends religious service: Not on file    Active member of club or organization: Not on file    Attends meetings of clubs or organizations: Not on file    Relationship status: Not on file  . Intimate partner violence:    Fear of current or ex partner: Not on file    Emotionally abused: Not on file    Physically abused: Not on file    Forced sexual activity: Not on file  Other Topics Concern  . Not on file  Social History Narrative   Married, 1 child    Family History  Problem Relation Age of Onset  .  Hypertension Mother   . Thyroid disease Mother   . Hypertension Maternal Grandmother   . Diabetes Maternal Grandmother   . Breast cancer Maternal Grandmother   . Stroke Maternal Grandmother   . Heart attack Neg Hx     ROS: No fever, myalgias/arthralgias, unexplained weight loss or weight gain No new focal weakness or sensory deficits No otalgia, hearing loss, visual changes, nasal and sinus symptoms, mouth and throat problems No neck pain or adenopathy No abdominal pain, N/V/D, diarrhea, change in bowel pattern No dysuria, change in urinary pattern   Vitals:   03/29/19 1030 03/29/19 1045 03/29/19 1246 03/29/19 1300  BP: 102/82 116/78 119/87 (!) 144/114  Pulse: (!) 127 (!) 126 (!) 128 (!) 127  Resp: (!)  27 (!) 23 (!) 22 (!) 30  Temp:      TempSrc:      SpO2: 98% 97% 97% 98%  Weight:      Height:       NRB mask  EXAM:  Gen: Obese, mildly tachypneic but able to complete full sentences HEENT: NCAT, sclera white, very poor dentition Neck: Supple without LAN, thyromegaly. JVP not well visualized Lungs: breath sounds full, no adventitious sounds Cardiovascular: distant HS, tachy, reg, no murmurs noted Abdomen: Soft, nontender, normal BS Ext: severe symmetric brawny edema with chronic stasis changes Neuro: CNs grossly intact, motor and sensory intact Skin: Limited exam, no lesions noted  DATA:   BMP Latest Ref Rng & Units 03/29/2019 05/08/2017 02/05/2017  Glucose 70 - 99 mg/dL 644(I) 347(Q) 259(D)  BUN 6 - 20 mg/dL 16 13 10   Creatinine 0.44 - 1.00 mg/dL 6.38(V) 5.64 3.32  Sodium 135 - 145 mmol/L 131(L) 139 140  Potassium 3.5 - 5.1 mmol/L 3.5 3.7 3.9  Chloride 98 - 111 mmol/L 104 105 105  CO2 22 - 32 mmol/L 21(L) 25 27  Calcium 8.9 - 10.3 mg/dL 9.5(J) 8.8(C) 9.1    CBC Latest Ref Rng & Units 03/29/2019 05/08/2017 02/05/2017  WBC 4.0 - 10.5 K/uL 16.7(H) 8.4 8.7  Hemoglobin 12.0 - 15.0 g/dL 16.6 06.3 01.6  Hematocrit 36.0 - 46.0 % 43.2 42.2 42.1  Platelets 150 - 400 K/uL 278 287 264    CXR:  No acute findings  I have personally reviewed all chest radiographs reported above including CXRs and CT chest unless otherwise indicated  IMPRESSION:   1) acute massive bilateral PE with CT evidence of R heart strain (RV/LV 2.6) 2) Morbid obesity 3) history of hypertension - recently initiated on amlodipine 4) Baseline severe immobility 5) Recent fx of R fourth toe (with further immobility) - Likely RF for acute VTE   PLAN:  Admit to ICU Supplemental O2 IV heparin per pharmacy IR consultation for catheter directed thrombolysis Echocardiogram ordered Holding amlodipine PPI for SUP - this may be discontinued once off of lytic therapy  Long term: This patient will need LIFELONG  ANTICOAGULATION as she will have a lifelong RF for recurrence. Therefore, there is no utility to performing hypercoag panel   Billy Fischer, MD PCCM service Mobile [phone] Pager [phone] 03/29/2019 1:25 PM

## 2019-03-29 NOTE — ED Triage Notes (Signed)
Per EMS: Pt from home with C/O sob and tachycardiac that began about 3 hours PTA.  Pt denies any fevers or sic contacts.  Pt does not currently use home 02.   EMS vitals:  BP 150/88 HR 130's 91% on RA 100% on non rebreather 10L CBG 294

## 2019-03-29 NOTE — Consult Note (Signed)
Chief Complaint: Patient was seen in consultation today for pulmonary embolism/PE lysis.  Referring Physician(s): Merwyn KatosSimonds, David B  Supervising Physician: Oley BalmHassell, Daniel  [facility]  History of Present Illness: Susan Warren is a 43 y.o. female with a past medical history of hypertension, migraines, GERD, morbid obesity, sleep apnea, anxiety, and depression. [facility] with complains of dyspnea and palpitations for approximately 4 hours. In ED, CTA chest revealed massive bilateral pulmonary eboli with a markedly abnormal RV/LV ratio. Critical care was consulted who recommended admission to ICU, IV Heparin per pharmacy, and IR consultation for possible catheter directed thrombolysis.  CTA chest 03/29/2019: 1. Massive bilateral pulmonary emboli with minimal perfusion to both lungs. 2. Markedly abnormal RV/LV ratio of 2.6.  IR requested by Dr. Sung AmabileSimonds for possible image-guided pulmonary arteriogram with possible thrombolysis. Patient awake and alert sitting in bed. Complains of dyspnea with associated chest pain, stable since admission today. Denies fever, chills, abdominal pain, or headache.  Patient is currently receiving Heparin continuous IV infusion.   Past Medical History:  Diagnosis Date   Allergy    Anxiety    Depression    GERD (gastroesophageal reflux disease)    Hypertension    Migraines    Sleep apnea     Past Surgical History:  Procedure Laterality Date   CHOLECYSTECTOMY      Allergies: Adhesive [tape]  Medications: Prior to Admission medications   Medication Sig Start Date End Date Taking? Authorizing Provider  amLODipine (NORVASC) 5 MG tablet Take 1 tablet (5 mg total) by mouth daily. 02/21/19  Yes Bill SalinasMorelli, Brandon A, PA-C     Family History  Problem Relation Age of Onset   Hypertension Mother    Thyroid disease Mother    Hypertension Maternal Grandmother    Diabetes Maternal Grandmother    Breast  cancer Maternal Grandmother    Stroke Maternal Grandmother    Heart attack Neg Hx     Social History   Socioeconomic History   Marital status: Married    Spouse name: Not on file   Number of children: Not on file   Years of education: 12   Highest education level: Not on file  Occupational History   Occupation: Admin.    Employer: [employer]  Social Needs   Financial resource strain: Not on file   Food insecurity:    Worry: Not on file    Inability: Not on file   Transportation needs:    Medical: Not on file    Non-medical: Not on file  Tobacco Use   Smoking status: Never Smoker   Smokeless tobacco: Never Used  Substance and Sexual Activity   Alcohol use: Yes    Comment: occ   Drug use: No   Sexual activity: Not Currently    Birth control/protection: I.U.D.  Lifestyle   Physical activity:    Days per week: Not on file    Minutes per session: Not on file   Stress: Not on file  Relationships   Social connections:    Talks on phone: Not on file    Gets together: Not on file    Attends religious service: Not on file    Active member of club or organization: Not on file    Attends meetings of clubs or organizations: Not on file    Relationship status: Not on file  Other Topics Concern   Not on file  Social History Narrative   Married, 1 child  Review of Systems: A 12 point ROS discussed and pertinent positives are indicated in the HPI above.  All other systems are negative.  Review of Systems  Constitutional: Negative for chills and fever.  Respiratory: Positive for shortness of breath. Negative for wheezing.   Cardiovascular: Positive for chest pain. Negative for palpitations.  Gastrointestinal: Negative for abdominal pain.  Neurological: Negative for headaches.  Psychiatric/Behavioral: Negative for behavioral problems and confusion.    Vital Signs: BP 126/89    Pulse (!) 121    Temp 99 F (37.2 C) (Oral)    Resp (!) 27    Ht   (1.626 m)    Wt (!) 450 lb (204.1 kg)    SpO2 94%    BMI 77.24 kg/m   Physical Exam Vitals signs and nursing note reviewed.  Constitutional:      General: She is not in acute distress.    Appearance: Normal appearance.  Cardiovascular:     Rate and Rhythm: Regular rhythm. Tachycardia present.     Heart sounds: Normal heart sounds. No murmur.  Pulmonary:     Effort: Pulmonary effort is normal. No respiratory distress.     Breath sounds: Normal breath sounds. No wheezing.  Skin:    General: Skin is warm and dry.  Neurological:     Mental Status: She is alert and oriented to person, place, and time.  Psychiatric:        Mood and Affect: Mood normal.        Behavior: Behavior normal.        Thought Content: Thought content normal.        Judgment: Judgment normal.      MD Evaluation Airway: WNL Heart: WNL Abdomen: WNL Chest/ Lungs: WNL ASA  Classification: 3 Mallampati/Airway Score: Two   Imaging: Ct Angio Chest Pe W/cm &/or Wo Cm  Result Date: 03/29/2019 CLINICAL DATA:  Shortness of breath. EXAM: CT ANGIOGRAPHY CHEST WITH CONTRAST TECHNIQUE: Multidetector CT imaging of the chest was performed using the standard protocol during bolus administration of intravenous contrast. Multiplanar CT image reconstructions and MIPs were obtained to evaluate the vascular anatomy. CONTRAST:  OMNIPAQUE IOHEXOL 350 MG/ML SOLN COMPARISON:  Chest x-ray dated 03/29/2019 and CT scan dated 02/05/2017 FINDINGS: Cardiovascular: There are massive bilateral pulmonary emboli. There is almost complete obstruction of the pulmonary arteries in the left lung with a tiny amount of flow into the left upper lobe. There is only minimal flow into the pulmonary arteries of the right lung due to the massive emboli. RV/LV ratio is 2.6 indicating marked right heart strain. Mediastinum/Nodes: No enlarged mediastinal, hilar, or axillary lymph nodes. Thyroid gland, trachea, and esophagus demonstrate no significant  findings. Lungs/Pleura: Lungs are clear. No pleural effusion or pneumothorax. Upper Abdomen: Normal. Musculoskeletal: No chest wall abnormality. No acute or significant osseous findings. Review of the MIP images confirms the above findings. IMPRESSION: 1. Massive bilateral pulmonary emboli with minimal perfusion to both lungs. 2. Markedly abnormal RV/LV ratio of 2.6. Critical Value/emergent results were called by telephone at the time of interpretation on 03/29/2019 at 12:30 pm to [facility], PA-C , who verbally acknowledged these results. Electronically Signed   By: Francene Boyers M.D.   On: 03/29/2019 12:32   Dg Chest Portable 1 View  Result Date: 03/29/2019 CLINICAL DATA:  Onset shortness of breath and tachycardia 3 hours ago. EXAM: PORTABLE CHEST 1 VIEW COMPARISON:  CT chest and PA and lateral chest 02/05/2017. FINDINGS: The lungs are clear. Heart size  is normal. No pneumothorax or pleural fluid. No acute or focal bony abnormality. IMPRESSION: Negative chest. Electronically Signed   By: Drusilla Kanner M.D.   On: 03/29/2019 10:32    Labs:  CBC: Recent Labs    03/29/19 1008  WBC 16.7*  HGB 13.9  HCT 43.2  PLT 278    COAGS: Recent Labs    03/29/19 1008  INR 1.1    BMP: Recent Labs    03/29/19 1008  NA 131*  K 3.5  CL 104  CO2 21*  GLUCOSE 170*  BUN 16  CALCIUM 8.5*  CREATININE 1.08*  GFRNONAA >60  GFRAA >60    LIVER FUNCTION TESTS: Recent Labs    03/29/19 1008  BILITOT 1.4*  AST 29  ALT 19  ALKPHOS 87  PROT 8.5*  ALBUMIN 3.6     Assessment and Plan:  Pulmonary embolism. Plan for image-guided pulmonary arteriogram with possible thrombolysis today with Dr. Deanne Coffer. Patient is NPO. Afebrile. INR 1.1 seconds today.  Risks and benefits discussed with the patient including, but not limited to bleeding, possible life threatening bleeding and need for blood product transfusion, vascular injury, stroke, contrast induced renal failure, limb loss and  infection.  Risks and benefits of pulmonary arteriogram were discussed with the patient including, but not limited to bleeding, infection, vascular injury or contrast induced renal failure. This interventional procedure involves the use of X-rays and because of the nature of the planned procedure, it is possible that we will have prolonged use of X-ray fluoroscopy. Potential radiation risks to you include (but are not limited to) the following: - A slightly elevated risk for cancer  several years later in life. This risk is typically less than 0.5% percent. This risk is low in comparison to the normal incidence of human cancer, which is 33% for women and 50% for men according to the American Cancer Society. - Radiation induced injury can include skin redness, resembling a rash, tissue breakdown / ulcers and hair loss (which can be temporary or permanent).  The likelihood of either of these occurring depends on the difficulty of the procedure and whether you are sensitive to radiation due to previous procedures, disease, or genetic conditions.  IF your procedure requires a prolonged use of radiation, you will be notified and given written instructions for further action.  It is your responsibility to monitor the irradiated area for the 2 weeks following the procedure and to notify your physician if you are concerned that you have suffered a radiation induced injury.    All of the patient's questions were answered, patient is agreeable to proceed. Consent signed and in chart.   Thank you for this interesting consult.  I greatly enjoyed meeting Afnan L Orgeron and look forward to participating in their care.  A copy of this report was sent to the requesting provider on this date.  Electronically Signed: Elwin Mocha, PA-C 03/29/2019, 3:14 PM   I spent a total of 40 Minutes in face to face in clinical consultation, greater than 50% of which was counseling/coordinating care for pulmonary embolism.

## 2019-03-29 NOTE — Progress Notes (Addendum)
ANTICOAGULATION CONSULT NOTE - Follow Up Consult  Pharmacy Consult for heparin Indication: pulmonary embolus w/ EKOS  Labs: Recent Labs    03/29/19 1008 03/29/19 1617 03/29/19 2217 03/30/19 0420 03/30/19 0422  HGB 13.9 13.0 12.3 12.1  --   HCT 43.2 39.5 38.3 37.0  --   PLT 278 245 233 200  --   LABPROT 13.8  --   --   --   --   INR 1.1  --   --   --   --   HEPARINUNFRC  --  <0.10* <0.10*  --  0.27*  CREATININE 1.08*  --   --  0.82  --   TROPONINI 0.76*  --   --   --   --     Assessment: 43yo female subtherapeutic on heparin with initial dosing, initially for CP but confirmed PE now w/ EKOS; no gtt issues or signs of bleeding per RN.  Goal of Therapy:  Heparin level 0.3-0.7 units/ml   Plan:  Will increase heparin gtt by 4 units/kgABW/hr to 2000 units/hr and check level with next scheduled labs.    Vernard Gambles, PharmD, BCPS  03/29/2019,11:11 PM   Addendum: Heparin level remains slightly below goal. Will increase heparin gtt by 10% to 2200 units/hr and check level with next lab draw. VB 03/30/2019 5:07 AM

## 2019-03-30 ENCOUNTER — Inpatient Hospital Stay (HOSPITAL_COMMUNITY): Payer: 59

## 2019-03-30 ENCOUNTER — Encounter (HOSPITAL_COMMUNITY): Payer: Self-pay | Admitting: Interventional Radiology

## 2019-03-30 DIAGNOSIS — I2699 Other pulmonary embolism without acute cor pulmonale: Secondary | ICD-10-CM

## 2019-03-30 HISTORY — PX: IR THROMB F/U EVAL ART/VEN FINAL DAY (MS): IMG5379

## 2019-03-30 LAB — CBC
HCT: 37 % (ref 36.0–46.0)
HCT: 36.9 % (ref 36.0–46.0)
Hemoglobin: 12.1 g/dL (ref 12.0–15.0)
Hemoglobin: 11.8 g/dL — ABNORMAL LOW (ref 12.0–15.0)
MCH: 29.4 pg (ref 26.0–34.0)
MCH: 28.6 pg (ref 26.0–34.0)
MCHC: 32.7 g/dL (ref 30.0–36.0)
MCHC: 32 g/dL (ref 30.0–36.0)
MCV: 89.8 fL (ref 80.0–100.0)
MCV: 89.6 fL (ref 80.0–100.0)
Platelets: 200 10*3/uL (ref 150–400)
Platelets: 192 10*3/uL (ref 150–400)
RBC: 4.12 MIL/uL (ref 3.87–5.11)
RBC: 4.12 MIL/uL (ref 3.87–5.11)
RDW: 14.3 % (ref 11.5–15.5)
RDW: 14.3 % (ref 11.5–15.5)
WBC: 15.4 10*3/uL — ABNORMAL HIGH (ref 4.0–10.5)
WBC: 15 10*3/uL — ABNORMAL HIGH (ref 4.0–10.5)
nRBC: 0 % (ref 0.0–0.2)
nRBC: 0 % (ref 0.0–0.2)

## 2019-03-30 LAB — BASIC METABOLIC PANEL
Anion gap: 9 (ref 5–15)
BUN: 16 mg/dL (ref 6–20)
CO2: 22 mmol/L (ref 22–32)
Calcium: 8.5 mg/dL — ABNORMAL LOW (ref 8.9–10.3)
Chloride: 105 mmol/L (ref 98–111)
Creatinine, Ser: 0.82 mg/dL (ref 0.44–1.00)
GFR calc Af Amer: >60 mL/min (ref >60)
GFR calc non Af Amer: >60 mL/min (ref >60)
Glucose, Bld: 120 mg/dL — ABNORMAL HIGH (ref 70–99)
Potassium: 3.4 mmol/L — ABNORMAL LOW (ref 3.5–5.1)
Sodium: 136 mmol/L (ref 135–145)

## 2019-03-30 LAB — HEPARIN LEVEL (UNFRACTIONATED)
Heparin Unfractionated: 0.27 IU/mL — ABNORMAL LOW (ref 0.30–0.70)
Heparin Unfractionated: 0.26 IU/mL — ABNORMAL LOW (ref 0.30–0.70)
Heparin Unfractionated: 0.37 IU/mL (ref 0.30–0.70)

## 2019-03-30 LAB — FIBRINOGEN
Fibrinogen: 342 mg/dL (ref 210–475)
Fibrinogen: 324 mg/dL (ref 210–475)
Fibrinogen: 366 mg/dL (ref 210–475)

## 2019-03-30 LAB — TSH: TSH: 1.869 u[IU]/mL (ref 0.350–4.500)

## 2019-03-30 MED ORDER — HYDRALAZINE HCL 20 MG/ML IJ SOLN: 2.0000 mg | Freq: Four times a day (QID) | INTRAMUSCULAR | Status: DC | PRN

## 2019-03-30 MED ORDER — POTASSIUM CHLORIDE CRYS ER 20 MEQ PO TBCR
40.0000 meq | EXTENDED_RELEASE_TABLET | Freq: Once | ORAL | Status: AC
  Administered 2019-03-30: 05:00:00 40 meq via ORAL
  Filled 2019-03-30: qty 2

## 2019-03-30 MED ORDER — AMLODIPINE BESYLATE 5 MG PO TABS
5.0000 mg | ORAL_TABLET | Freq: Every day | ORAL | Status: DC
  Administered 2019-03-30 – 2019-04-05 (×7): 5 mg via ORAL
  Filled 2019-03-30 (×7): qty 1

## 2019-03-30 MED ORDER — HEPARIN BOLUS VIA INFUSION
1000.0000 [IU] | Freq: Once | INTRAVENOUS | Status: AC
  Administered 2019-03-30: 12:00:00 1000 [IU] via INTRAVENOUS
  Filled 2019-03-30: qty 1000

## 2019-03-30 NOTE — Progress Notes (Signed)
eLink Physician-Brief Progress Note Patient Name: Susan Warren DOB: [birth year] MRN: [id number]   Date of Service  03/30/2019  HPI/Events of Note  K+ = 3.4 and Creatinine = 0.82.   eICU Interventions  Will replace K+.      Intervention Category Major Interventions: Electrolyte abnormality - evaluation and management  Midge Momon Eugene 03/30/2019, 5:16 AM

## 2019-03-30 NOTE — Progress Notes (Addendum)
PULMONARY/CCM CONSULT NOTE  Requesting MD/Service: [facility] Date of initial consultation: 03/29/19 Reason for consultation:  Acute hypoxemic respiratory failure, massive acute PE  PT PROFILE: 43 y.o. 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 Hx of hypertension. Never hypotensive but has persistent sinus tachycardia (130/min)  DATA: 03/29/19 CTA chest: Massive bilateral pulmonary emboli with minimal perfusion to both lungs.  RV/LV ratio of 2.6  INTERVAL:  HPI:  As above. She was in USOH on the day prior to this admission. She was ambulating this AM shortly after awakening and developed sudden onset of severe dyspnea and a sense of "panic". Presented to ED shortly after that and was noted to be hypoxemic requiring supplemental O2. She has no prior history of VTE. There is no personal or family of bleeding diatheses. She has no history of recent surgery or trauma and specifically no injuries to her head. She is not presently sexually active and has an IUD in place. Denies LOC, CP, fever, purulent sputum, hemoptysis and calf tenderness. She has chronic, severe LE edema  Past Medical History:  Diagnosis Date  . Allergy   . Anxiety   . Depression   . GERD (gastroesophageal reflux disease)   . Hypertension   . Migraines   . Sleep apnea     Past Surgical History:  Procedure Laterality Date  . CHOLECYSTECTOMY    . IR INFUSION THROMBOL ARTERIAL INITIAL (MS)  03/29/2019  . IR INFUSION THROMBOL ARTERIAL INITIAL (MS)  03/29/2019  . IR US GUIDE VASC ACCESS RIGHT  03/29/2019    MEDICATIONS: Reviewed    Vitals:   03/30/19 0600 03/30/19 0700 03/30/19 0800 03/30/19 0900  BP: 128/88 (!) 142/91 125/86 (!) 144/86  Pulse: 88 89 92 92  Resp: (!) 21 19 (!) 29 15  Temp:   97.6 F (36.4 C)    TempSrc:  Oral    SpO2: 94% 94% 94% 92%  Weight:      Height:       NRB mask  EXAM:  Gen: Obese, mildly tachypneic but able to complete full sentences HEENT: NCAT, sclera white, very poor dentition Neck: Supple without LAN, thyromegaly. JVP not well visualized Lungs: breath sounds full, no adventitious sounds Cardiovascular: distant HS, tachy, reg, no murmurs noted Abdomen: Soft, nontender, normal BS Ext: severe symmetric brawny edema with chronic stasis changes Neuro: CNs grossly intact, motor and sensory intact Skin: Limited exam, no lesions noted  DATA:   BMP Latest Ref Rng & Units 03/30/2019 03/29/2019 05/08/2017  Glucose 70 - 99 mg/dL 161(W120(H) 960(A170(H) 540(J105(H)  BUN 6 - 20 mg/dL 16 16 13   Creatinine 0.44 - 1.00 mg/dL 8.110.82 9.14(N1.08(H) 8.290.74  Sodium 135 - 145 mmol/L 136 131(L) 139  Potassium 3.5 - 5.1 mmol/L 3.4(L) 3.5 3.7  Chloride 98 - 111 mmol/L 105 104 105  CO2 22 - 32 mmol/L 22 21(L) 25  Calcium 8.9 - 10.3 mg/dL 5.6(O8.5(L) 1.3(Y8.5(L) 8.6(V8.8(L)    CBC Latest Ref Rng & Units 03/30/2019 03/29/2019 03/29/2019  WBC 4.0 - 10.5 K/uL 15.4(H) 16.9(H) 16.4(H)  Hemoglobin 12.0 - 15.0 g/dL 78.412.1 69.612.3 29.513.0  Hematocrit 36.0 - 46.0 % 37.0 38.3 39.5  Platelets 150 - 400 K/uL 200 233 245    CXR:  4/6>> No acute findings States she feels better, weaning oxygen  EKOS cath removed 4/7 at 09:45   IMPRESSION:   Acute Respiratory Failure 2/2 acute massive bilateral PE with CT evidence of R heart strain (RV/LV 2.6) Remains on 2 L [place] with sats of 92% Morbid obesity and recent toe surgery RF for acute PE Catheter directed thrombolysis 4/6-4/7 Plan: ICU status for now Titrate  oxygen to maintain sats > 92% CXR in am and prn Continue Heparin EKOS and TPA started between 4 and 5 pm on 4/6.  TPA completed between 4 and 5 am 4/7.  EKOS cath removed 4/7 at 09:45. Will need lifelong anticoagulation   Cardiology Hemodynamically stable Echo>> EF > 65%, LV hyperdynamic function, moderately  increased LV wall thickness, interventricular septum is flattened in systole, consistent with right ventricular pressure overload.RV with severely reduced systolic function, cavity moderately enlarged, RV systolic pressure severely elevated with an estimated pressure of 56.0 mmHg.Mild pulmonic valve regurgitation Plan CXR prn Maintain MAP > 65 and SBP < 160  Tele monitoring Will need 6 month follow up echo as OP    Hypokalemia Low normal Na Calcium corrects to 8.8 Plan Trend BMET Replete K Minimize free water KVO IVF as patient is taking po's well Replete electrolytes as needed Ionized calcium in am    Hematology  HGB drop of 1.5 grams over night No signs of active bleeding Plts stable Plan: CBC, heparin and fibrinogen  Q 6 x 2 additional draws per protocol If HGB continues to down trend, will need to do additional draws Trend CBC Transfuse for HGB < 7 Maintain BP < 160,  Prn apresoline  Leukocytosis T max 99.3 WBC Down trending Plan Trend CBC Trend fever Culture as is clinically indicated   History of hypertension  recently started  on amlodipine BP goal post TPa is < 160 Plan BP monitoring Prn apresoline Hold amlodipine   PLAN:  Admit to ICU Supplemental O2 IV heparin per pharmacy IR consultation for catheter directed thrombolysis >> completed 4/7 Echocardiogram ordered Holding amlodipine PPI for SUP - may be d/c's 4/8  Long term: This patient will need [facility] ANTICOAGULATION as she will have a lifelong RF for recurrence. Therefore, there is no utility to performing hypercoag panel   CC APP time 40 minutes  Bevelyn Ngo, AGACNP-BC [facility] Pulmonary/Critical Care Medicine Pager # [phone] If no answer [phone] 03/30/2019 9:32 AM  03/30/2019 9:31 AM   Submassive PE with RV/LV ratio 2.6, hemodynamically stable, received catheter directed lytics and feels much improved, remains on 5 L oxygen. Duplex surprisingly negative. Risk factors  seems to be morbid obesity and recent toe injury with limited mobility She was subtherapeutic on heparin overnight but has reached therapeutic goal this morning, would continue IV heparin another 24 hours before switching to oral anticoagulation.  She can transfer to telemetry once catheter is removed by IR and to triad 4/8.  Would keep her in the hospital for another 48 hours given high clot burden She will need pulmonary office follow-up in 6 to 8 weeks   V. Vassie Loll MD

## 2019-03-30 NOTE — Progress Notes (Signed)
ANTICOAGULATION CONSULT NOTE - Follow Up Consult  Pharmacy Consult for heparin Indication: pulmonary embolus w/ EKOS  Labs: Recent Labs    03/29/19 1008  03/29/19 2217 03/30/19 0420 03/30/19 0422 03/30/19 1030 03/30/19 1806  HGB 13.9   < > 12.3 12.1  --  11.8*  --   HCT 43.2   < > 38.3 37.0  --  36.9  --   PLT 278   < > 233 200  --  192  --   LABPROT 13.8  --   --   --   --   --   --   INR 1.1  --   --   --   --   --   --   HEPARINUNFRC  --    < > <0.10*  --  0.27* 0.26* 0.37  CREATININE 1.08*  --   --  0.82  --   --   --   TROPONINI 0.76*  --   --   --   --   --   --    < > = values in this interval not displayed.    Assessment: 43yo female on heparin for bilateral PE s/p EKOS on 4/7 early morning. Heparin level now therapeutic x1 at 0.37 after multiple rate adjustments, fibrinogen is wnl.  Goal of Therapy:  Heparin level 0.3-0.7 units/ml   Plan:  -Continue heparin 2500 units/hr -Recheck heparin level and fibrinogen in 6hr   Fredonia Highland, PharmD, BCPS Clinical Pharmacist [phone] Please check AMION for all [facility] Pharmacy numbers 03/30/2019

## 2019-03-30 NOTE — Progress Notes (Signed)
ANTICOAGULATION CONSULT NOTE - Follow Up Consult  Pharmacy Consult for heparin Indication: pulmonary embolus w/ EKOS  Labs: Recent Labs    03/29/19 1008  03/29/19 2217 03/30/19 0420 03/30/19 0422 03/30/19 1030  HGB 13.9   < > 12.3 12.1  --  11.8*  HCT 43.2   < > 38.3 37.0  --  36.9  PLT 278   < > 233 200  --  192  LABPROT 13.8  --   --   --   --   --   INR 1.1  --   --   --   --   --   HEPARINUNFRC  --    < > <0.10*  --  0.27* 0.26*  CREATININE 1.08*  --   --  0.82  --   --   TROPONINI 0.76*  --   --   --   --   --    < > = values in this interval not displayed.    Assessment: 43yo female on heparin for bilateral PE s/p EKOS.   Heparin has remained subtherapeutic, at 0.26 on heparin 2200 units/hr. Hgb relatively stable. No signs/symptoms of bleeding or issues with infusion per nursing.   Goal of Therapy:  Heparin level 0.3-0.7 units/ml   Plan:  Give 1000 units bolus x 1 Increase heparin infusion to 2500 units/hr Check anti-Xa level in 6 hours and daily while on heparin Continue to monitor H&H and platelets  Marcelino Freestone, PharmD PGY2 Cardiology Pharmacy Resident Please check AMION for all Pharmacist numbers by unit 03/30/2019 11:43 AM

## 2019-03-30 NOTE — Progress Notes (Signed)
Bilateral lower extremity venous duplex has been completed. Preliminary results can be found in CV Proc through chart review.   03/30/19 9:36 AM Olen Cordial RVT

## 2019-03-31 ENCOUNTER — Inpatient Hospital Stay (HOSPITAL_COMMUNITY): Payer: 59

## 2019-03-31 LAB — COMPREHENSIVE METABOLIC PANEL
ALT: 29 U/L (ref 0–44)
AST: 36 U/L (ref 15–41)
Albumin: 2.9 g/dL — ABNORMAL LOW (ref 3.5–5.0)
Alkaline Phosphatase: 69 U/L (ref 38–126)
Anion gap: 6 (ref 5–15)
BUN: 14 mg/dL (ref 6–20)
CO2: 20 mmol/L — ABNORMAL LOW (ref 22–32)
Calcium: 8.3 mg/dL — ABNORMAL LOW (ref 8.9–10.3)
Chloride: 107 mmol/L (ref 98–111)
Creatinine, Ser: 0.76 mg/dL (ref 0.44–1.00)
GFR calc Af Amer: >60 mL/min (ref >60)
GFR calc non Af Amer: >60 mL/min (ref >60)
Glucose, Bld: 115 mg/dL — ABNORMAL HIGH (ref 70–99)
Potassium: 3.7 mmol/L (ref 3.5–5.1)
Sodium: 133 mmol/L — ABNORMAL LOW (ref 135–145)
Total Bilirubin: 1 mg/dL (ref 0.3–1.2)
Total Protein: 6.6 g/dL (ref 6.5–8.1)

## 2019-03-31 LAB — HEPARIN LEVEL (UNFRACTIONATED): Heparin Unfractionated: 0.34 IU/mL (ref 0.30–0.70)

## 2019-03-31 LAB — CBC
HCT: 35.2 % — ABNORMAL LOW (ref 36.0–46.0)
Hemoglobin: 11.3 g/dL — ABNORMAL LOW (ref 12.0–15.0)
MCH: 28.5 pg (ref 26.0–34.0)
MCHC: 32.1 g/dL (ref 30.0–36.0)
MCV: 88.7 fL (ref 80.0–100.0)
Platelets: 174 10*3/uL (ref 150–400)
RBC: 3.97 MIL/uL (ref 3.87–5.11)
RDW: 14.3 % (ref 11.5–15.5)
WBC: 13.6 10*3/uL — ABNORMAL HIGH (ref 4.0–10.5)
nRBC: 0.1 % (ref 0.0–0.2)

## 2019-03-31 LAB — FIBRINOGEN: Fibrinogen: 346 mg/dL (ref 210–475)

## 2019-03-31 LAB — MAGNESIUM: Magnesium: 1.7 mg/dL (ref 1.7–2.4)

## 2019-03-31 NOTE — Progress Notes (Signed)
IR round note via telephone due to new regulations. Spoke with Lorene Dy, RN.  Patient underwent an image-guided pulmonary angiogram with thrombolysis 03/29/2019 by Dr. Deanne Coffer via right IJ approach. Catheters were removed from right IJ 03/30/2019 by Dr. Deanne Coffer.  RN states right IJ incision site soft without active bleeding or hematoma. States patient is sating 94-96% on room air.  Please call IR with questions/concerns.  Waylan Boga Othello Dickenson, PA-C 03/31/2019, 10:59 AM

## 2019-03-31 NOTE — Progress Notes (Signed)
PROGRESS NOTE    TKEYA STENCIL  WUJ:[id number] DOB: [birth year] DOA: 03/29/2019 PCP: Patient, No Pcp Per    Brief Narrative:  43yo with hx limited mobility, morbid obesity, recent R toe fracture which limits mobility presented with acute hypoxemic respiratory failure, found to have massive acute bilateral PE  Assessment & Plan:   Active Problems:   IUD (intrauterine device) in place   Acute massive pulmonary embolism (HCC)   1. Acute hypoxemic respiratory failure 1. O2 weaned to room air 2. Stable at present. 2. Acute B PE 1. Currently continued on heparin gtt 2. Pt is s/p thrombolysis 4/6-4/7 3. Anticipate transition to NOAC at time of d/c. Per Pulmonary, will need lifelong anticoagulation 4. Per CCM, given clot burden, will continue hospitalization x 24 more hrs 3. Morbid obesity 1. Stable at this time 2. Recommend die/lifestyle modification  DVT prophylaxis: heparin gtt Code Status: Full Family Communication: Pt in room, family not at bedside Disposition Plan: Possible d/c home in 24hrs  Consultants:   CCM  Procedures:   Thrombolysis  Antimicrobials: Anti-infectives (From admission, onward)   None       Subjective: Reports feeling much better. Eager about going home  Objective: Vitals:   03/30/19 1937 03/31/19 0002 03/31/19 0403 03/31/19 0821  BP: 131/76 (!) 156/89 (!) 148/97 (!) 161/101  Pulse: 90 95 87 84  Resp:      Temp: 98.3 F (36.8 C) 98 F (36.7 C) 98.1 F (36.7 C) (!) 97.5 F (36.4 C)  TempSrc: Oral Oral Oral Oral  SpO2: 98% 95% 96% 98%  Weight:   (!) 223.2 kg   Height:        Intake/Output Summary (Last 24 hours) at 03/31/2019 1122 Last data filed at 03/30/2019 2115 Gross per 24 hour  Intake 521.41 ml  Output --  Net 521.41 ml   Filed Weights   03/29/19 0949 03/30/19 0500 03/31/19 0403  Weight: (!) 204.1 kg (!) 222.6 kg (!) 223.2 kg    Examination:  General exam: Appears calm and comfortable  Respiratory system: Clear to  auscultation. Respiratory effort normal. Cardiovascular system: S1 & S2 heard, RRR Gastrointestinal system: Abdomen is nondistended, soft and nontender. No organomegaly or masses felt. Normal bowel sounds heard. Central nervous system: Alert and oriented. No focal neurological deficits. Extremities: Symmetric 5 x 5 power. Skin: No rashes, lesions  Psychiatry: Judgement and insight appear normal. Mood & affect appropriate.   Data Reviewed: I have personally reviewed following labs and imaging studies  CBC: Recent Labs  Lab 03/29/19 1617 03/29/19 2217 03/30/19 0420 03/30/19 1030 03/31/19 0236  WBC 16.4* 16.9* 15.4* 15.0* 13.6*  HGB 13.0 12.3 12.1 11.8* 11.3*  HCT 39.5 38.3 37.0 36.9 35.2*  MCV 87.6 88.7 89.8 89.6 88.7  PLT 245 233 200 192 174   Basic Metabolic Panel: Recent Labs  Lab 03/29/19 1008 03/30/19 0420 03/31/19 0236  NA 131* 136 133*  K 3.5 3.4* 3.7  CL 104 105 107  CO2 21* 22 20*  GLUCOSE 170* 120* 115*  BUN CREATININE 1.08* 0.82 0.76  CALCIUM 8.5* 8.5* 8.3*  MG  --   --  1.7   GFR: Estimated Creatinine Clearance: 171.2 mL/min (by C-G formula based on SCr of 0.76 mg/dL). Liver Function Tests: Recent Labs  Lab 03/29/19 1008 03/31/19 0236  AST 29 36  ALT 19 29  ALKPHOS 87 69  BILITOT 1.4* 1.0  PROT 8.5* 6.6  ALBUMIN 3.6 2.9*   No results for  input(s): LIPASE, AMYLASE in the last 168 hours. No results for input(s): AMMONIA in the last 168 hours. Coagulation Profile: Recent Labs  Lab 03/29/19 1008  INR 1.1   Cardiac Enzymes: Recent Labs  Lab 03/29/19 1008  TROPONINI 0.76*   BNP (last 3 results) No results for input(s): PROBNP in the last 8760 hours. HbA1C: No results for input(s): HGBA1C in the last 72 hours. CBG: Recent Labs  Lab 03/29/19 1702  GLUCAP 165*   Lipid Profile: No results for input(s): CHOL, HDL, LDLCALC, TRIG, CHOLHDL, LDLDIRECT in the last 72 hours. Thyroid Function Tests: Recent Labs    03/30/19 0422    TSH 1.869   Anemia Panel: No results for input(s): VITAMINB12, FOLATE, FERRITIN, TIBC, IRON, RETICCTPCT in the last 72 hours. Sepsis Labs: No results for input(s): PROCALCITON, LATICACIDVEN in the last 168 hours.  Recent Results (from the past 240 hour(s))  MRSA PCR Screening     Status: None   Collection Time: 03/29/19  5:56 PM  Result Value Ref Range Status   MRSA by PCR NEGATIVE NEGATIVE Final    Comment:        The GeneXpert MRSA Assay (FDA approved for NASAL specimens only), is one component of a comprehensive MRSA colonization surveillance program. It is not intended to diagnose MRSA infection nor to guide or monitor treatment for MRSA infections. Performed at [facility], [address], [place], [place] [address]      Radiology Studies: Ct Angio Chest Pe W/cm &/or Wo Cm  Result Date: 03/29/2019 CLINICAL DATA:  Shortness of breath. EXAM: CT ANGIOGRAPHY CHEST WITH CONTRAST TECHNIQUE: Multidetector CT imaging of the chest was performed using the standard protocol during bolus administration of intravenous contrast. Multiplanar CT image reconstructions and MIPs were obtained to evaluate the vascular anatomy. CONTRAST:  OMNIPAQUE IOHEXOL 350 MG/ML SOLN COMPARISON:  Chest x-ray dated 03/29/2019 and CT scan dated 02/05/2017 FINDINGS: Cardiovascular: There are massive bilateral pulmonary emboli. There is almost complete obstruction of the pulmonary arteries in the left lung with a tiny amount of flow into the left upper lobe. There is only minimal flow into the pulmonary arteries of the right lung due to the massive emboli. RV/LV ratio is 2.6 indicating marked right heart strain. Mediastinum/Nodes: No enlarged mediastinal, hilar, or axillary lymph nodes. Thyroid gland, trachea, and esophagus demonstrate no significant findings. Lungs/Pleura: Lungs are clear. No pleural effusion or pneumothorax. Upper Abdomen: Normal. Musculoskeletal: No chest wall abnormality. No acute or  significant osseous findings. Review of the MIP images confirms the above findings. IMPRESSION: 1. Massive bilateral pulmonary emboli with minimal perfusion to both lungs. 2. Markedly abnormal RV/LV ratio of 2.6. Critical Value/emergent results were called by telephone at the time of interpretation on 03/29/2019 at 12:30 pm to [facility], PA-C , who verbally acknowledged these results. Electronically Signed   By: Francene Boyers M.D.   On: 03/29/2019 12:32   Ir US Guide Vasc Access Right  Result Date: 03/29/2019 INDICATION: Acute massive bilateral pulmonary emboli with significant right heart strain. EXAM: 1. ULTRASOUND GUIDANCE FOR VENOUS ACCESS X2 2. BILATERAL PULMONARY ARTERIOGRAPHY 3. FLUOROSCOPIC GUIDED PLACEMENT OF BILATERAL PULMONARY ARTERIAL LYTIC INFUSION CATHETERS COMPARISON:  CTA 03/29/2019 MEDICATIONS: Intravenous Fentanyl and Versed  were administered as conscious sedation during continuous monitoring of the patient's level of consciousness and physiological / cardiorespiratory status by the radiology RN, with a total moderate sedation time of 20 minutes. CONTRAST:  None FLUOROSCOPY TIME:  4 minutes 30 seconds; 65 mGy COMPLICATIONS:  None immediate TECHNIQUE: Informed written consent was obtained from the patient after a discussion of the risks, benefits and alternatives to treatment. Questions regarding the procedure were encouraged and answered. A timeout was performed prior to the initiation of the procedure. Ultrasound scanning was performed demonstrating patency of the right internal jugular vein which was selected for vascular access. The right neck was prepped and draped in the usual sterile fashion, and a sterile drape was applied covering the operative field. Maximum barrier sterile technique with sterile gowns and gloves were used for the procedure. A timeout was performed prior to the initiation of the procedure. Local anesthesia was provided with 1% lidocaine. Under direct  ultrasound guidance, the right internal jugular vein was accessed with a micro puncture sheath ultimately allowing placement of a 6 French vascular sheath. Slightly cranial to this initial access, the right internal jugular vein was again accessed with a micropuncture sheath ultimately allowing placement of a 6 French vascular sheath. With the use of a Benson wire, a 5 French angled pigtail catheter was advanced into the right main pulmonary artery. Over an exchange length Rosen wire, the pigtail catheter was exchanged for a 105/18 cm multi side-hole EKOS ultrasound assisted infusion catheter. With the use of a Bentson wire, via the second sheath a 5 French pigtail catheter was advanced into the left main pulmonary artery . Over an exchange length Rosen wire, the pigtail catheter was exchanged for a 105/12 cm multi side-hole EKOS ultrasound assisted infusion catheter. A postprocedural fluoroscopic image was obtained of the check demonstrating final catheter positioning. Following the procedure, both ultrasound assisted infusion catheter tips terminate within the distal aspects of the bilateral lower lobe sub segmental pulmonary arteries. Both vascular sheath were secured at the right neck with interrupted 0 Prolene sutures. The external catheter tubing was secured at the right chest and the lytic therapy was initiated. The patient tolerated the procedure well without immediate postprocedural complication. IMPRESSION: 1. Successful fluoroscopic guided initiation of bilateral ultrasound assisted catheter directed pulmonary arterial lysis for acute bilateral massive pulmonary embolism and right-sided heart strain. PLAN: -ICU observation during infusion -After at least 12 hours infusion, obtain pressure measurements and either removal of the catheters or continuation of the catheter directed thrombolysis. -consider lower extremity Doppler to exclude significant residual DVT Electronically Signed   By: Corlis Leak M.D.    On: 03/29/2019 16:28   Dg Chest Port 1 View  Result Date: 03/31/2019 CLINICAL DATA:  Shortness of Breath EXAM: PORTABLE CHEST 1 VIEW COMPARISON:  03/29/2019 FINDINGS: Cardiac shadow is stable. The lungs are well aerated bilaterally. No focal infiltrate or sizable effusion is seen. No acute bony abnormality is noted. IMPRESSION: No active disease. Electronically Signed   By: Alcide Clever M.D.   On: 03/31/2019 08:22   Vas [place] Lower Extremity Venous (dvt)  Result Date: 03/30/2019  Lower Venous Study Indications: Pulmonary embolism.  Limitations: Body habitus and poor ultrasound/tissue interface. Performing Technologist: Chanda Busing RVT  Examination Guidelines: A complete evaluation includes B-mode imaging, spectral Doppler, color Doppler, and power Doppler as needed of all accessible portions of each vessel. Bilateral testing is considered an integral part of a complete examination. Limited examinations for reoccurring indications may be performed as noted.  Right Venous Findings: +---------+---------------+---------+-----------+----------+--------------+            Compressibility Phasicity Spontaneity Properties Summary         +---------+---------------+---------+-----------+----------+--------------+  CFV       Full  No        No                                     +---------+---------------+---------+-----------+----------+--------------+  SFJ       Full                                                             +---------+---------------+---------+-----------+----------+--------------+  FV Prox   Full                                                             +---------+---------------+---------+-----------+----------+--------------+  FV Mid                                                     Not visualized  +---------+---------------+---------+-----------+----------+--------------+  FV Distal                                                  Not visualized   +---------+---------------+---------+-----------+----------+--------------+  PFV                                                        Not visualized  +---------+---------------+---------+-----------+----------+--------------+  POP                                                        Not visualized  +---------+---------------+---------+-----------+----------+--------------+  PTV                                                        Not visualized  +---------+---------------+---------+-----------+----------+--------------+  PERO                                                       Not visualized  +---------+---------------+---------+-----------+----------+--------------+  Left Venous Findings: +---------+---------------+---------+-----------+----------+--------------+            Compressibility Phasicity Spontaneity Properties Summary         +---------+---------------+---------+-----------+----------+--------------+  CFV       Full            Yes       Yes                                    +---------+---------------+---------+-----------+----------+--------------+  SFJ       Full                                                             +---------+---------------+---------+-----------+----------+--------------+  FV Prox   Full                                                             +---------+---------------+---------+-----------+----------+--------------+  FV Mid    Full                                                             +---------+---------------+---------+-----------+----------+--------------+  FV Distal                                                  Not visualized  +---------+---------------+---------+-----------+----------+--------------+  PFV                                                        Not visualized  +---------+---------------+---------+-----------+----------+--------------+  POP       Full            Yes       No                                      +---------+---------------+---------+-----------+----------+--------------+  PTV                                                        Not visualized  +---------+---------------+---------+-----------+----------+--------------+  PERO                                                       Not visualized  +---------+---------------+---------+-----------+----------+--------------+    Summary: Right: There is no evidence of deep vein thrombosis in the lower extremity. However, portions of this examination were limited- see technologist comments above. No cystic structure found in the popliteal fossa. Left: There is no evidence of deep vein thrombosis in the lower extremity. However, portions of this examination were limited- see technologist comments above. No cystic structure found in the popliteal fossa.  *See table(s) above for measurements and observations. Electronically signed by Waverly Ferrari MD on 03/30/2019 at 5:18:24 PM.    Final  Ir Infusion Thrombol Arterial Initial (ms)  Result Date: 03/29/2019 INDICATION: Acute massive bilateral pulmonary emboli with significant right heart strain. EXAM: 1. ULTRASOUND GUIDANCE FOR VENOUS ACCESS X2 2. BILATERAL PULMONARY ARTERIOGRAPHY 3. FLUOROSCOPIC GUIDED PLACEMENT OF BILATERAL PULMONARY ARTERIAL LYTIC INFUSION CATHETERS COMPARISON:  CTA 03/29/2019 MEDICATIONS: Intravenous Fentanyl and Versed 1mg  were administered as conscious sedation during continuous monitoring of the patient's level of consciousness and physiological / cardiorespiratory status by the radiology RN, with a total moderate sedation time of 20 minutes. CONTRAST:  None FLUOROSCOPY TIME:  4 minutes 30 seconds; 65 mGy COMPLICATIONS: None immediate TECHNIQUE: Informed written consent was obtained from the patient after a discussion of the risks, benefits and alternatives to treatment. Questions regarding the procedure were encouraged and answered. A timeout was performed prior to the initiation  of the procedure. Ultrasound scanning was performed demonstrating patency of the right internal jugular vein which was selected for vascular access. The right neck was prepped and draped in the usual sterile fashion, and a sterile drape was applied covering the operative field. Maximum barrier sterile technique with sterile gowns and gloves were used for the procedure. A timeout was performed prior to the initiation of the procedure. Local anesthesia was provided with 1% lidocaine. Under direct ultrasound guidance, the right internal jugular vein was accessed with a micro puncture sheath ultimately allowing placement of a 6 French vascular sheath. Slightly cranial to this initial access, the right internal jugular vein was again accessed with a micropuncture sheath ultimately allowing placement of a 6 French vascular sheath. With the use of a Benson wire, a 5 French angled pigtail catheter was advanced into the right main pulmonary artery. Over an exchange length Rosen wire, the pigtail catheter was exchanged for a 105/18 cm multi side-hole EKOS ultrasound assisted infusion catheter. With the use of a Bentson wire, via the second sheath a 5 French pigtail catheter was advanced into the left main pulmonary artery . Over an exchange length Rosen wire, the pigtail catheter was exchanged for a 105/12 cm multi side-hole EKOS ultrasound assisted infusion catheter. A postprocedural fluoroscopic image was obtained of the check demonstrating final catheter positioning. Following the procedure, both ultrasound assisted infusion catheter tips terminate within the distal aspects of the bilateral lower lobe sub segmental pulmonary arteries. Both vascular sheath were secured at the right neck with interrupted 0 Prolene sutures. The external catheter tubing was secured at the right chest and the lytic therapy was initiated. The patient tolerated the procedure well without immediate postprocedural complication. IMPRESSION: 1.  Successful fluoroscopic guided initiation of bilateral ultrasound assisted catheter directed pulmonary arterial lysis for acute bilateral massive pulmonary embolism and right-sided heart strain. PLAN: -ICU observation during infusion -After at least 12 hours infusion, obtain pressure measurements and either removal of the catheters or continuation of the catheter directed thrombolysis. -consider lower extremity Doppler to exclude significant residual DVT Electronically Signed   By: Corlis Leak M.D.   On: 03/29/2019 16:28   Ir Infusion Thrombol Arterial Initial (ms)  Result Date: 03/29/2019 INDICATION: Acute massive bilateral pulmonary emboli with significant right heart strain. EXAM: 1. ULTRASOUND GUIDANCE FOR VENOUS ACCESS X2 2. BILATERAL PULMONARY ARTERIOGRAPHY 3. FLUOROSCOPIC GUIDED PLACEMENT OF BILATERAL PULMONARY ARTERIAL LYTIC INFUSION CATHETERS COMPARISON:  CTA 03/29/2019 MEDICATIONS: Intravenous Fentanyl and Versed 1mg  were administered as conscious sedation during continuous monitoring of the patient's level of consciousness and physiological / cardiorespiratory status by the radiology RN, with a total moderate sedation time of 20 minutes. CONTRAST:  None  FLUOROSCOPY TIME:  4 minutes 30 seconds; 65 mGy COMPLICATIONS: None immediate TECHNIQUE: Informed written consent was obtained from the patient after a discussion of the risks, benefits and alternatives to treatment. Questions regarding the procedure were encouraged and answered. A timeout was performed prior to the initiation of the procedure. Ultrasound scanning was performed demonstrating patency of the right internal jugular vein which was selected for vascular access. The right neck was prepped and draped in the usual sterile fashion, and a sterile drape was applied covering the operative field. Maximum barrier sterile technique with sterile gowns and gloves were used for the procedure. A timeout was performed prior to the initiation of the  procedure. Local anesthesia was provided with 1% lidocaine. Under direct ultrasound guidance, the right internal jugular vein was accessed with a micro puncture sheath ultimately allowing placement of a 6 French vascular sheath. Slightly cranial to this initial access, the right internal jugular vein was again accessed with a micropuncture sheath ultimately allowing placement of a 6 French vascular sheath. With the use of a Benson wire, a 5 French angled pigtail catheter was advanced into the right main pulmonary artery. Over an exchange length Rosen wire, the pigtail catheter was exchanged for a 105/18 cm multi side-hole EKOS ultrasound assisted infusion catheter. With the use of a Bentson wire, via the second sheath a 5 French pigtail catheter was advanced into the left main pulmonary artery . Over an exchange length Rosen wire, the pigtail catheter was exchanged for a 105/12 cm multi side-hole EKOS ultrasound assisted infusion catheter. A postprocedural fluoroscopic image was obtained of the check demonstrating final catheter positioning. Following the procedure, both ultrasound assisted infusion catheter tips terminate within the distal aspects of the bilateral lower lobe sub segmental pulmonary arteries. Both vascular sheath were secured at the right neck with interrupted 0 Prolene sutures. The external catheter tubing was secured at the right chest and the lytic therapy was initiated. The patient tolerated the procedure well without immediate postprocedural complication. IMPRESSION: 1. Successful fluoroscopic guided initiation of bilateral ultrasound assisted catheter directed pulmonary arterial lysis for acute bilateral massive pulmonary embolism and right-sided heart strain. PLAN: -ICU observation during infusion -After at least 12 hours infusion, obtain pressure measurements and either removal of the catheters or continuation of the catheter directed thrombolysis. -consider lower extremity Doppler to  exclude significant residual DVT Electronically Signed   By: Corlis Leak M.D.   On: 03/29/2019 16:28   Ir Rande Lawman F/u Eval Art/ven Final Day (ms)  Result Date: 03/30/2019 INDICATION: History of acute bilateral massive pulmonary embolism, post initiation of bilateral pulmonary arterial thrombolysis on 03/29/2019. Patient has completed 12 hours of bilateral pulmonary arterial lytic infusion and is seen in the ICU for repeat pulmonary arterial pressure measurements. EXAM: PULMONARY ARTERIAL PRESSURE MEASUREMENT AND PULMONARY INFUSION CATHETER REMOVAL COMPARISON:  Initiation of bilateral pulmonary arterial catheter directed thrombolysis-03/29/2019; chest CTA 03/29/2019 MEDICATIONS: None CONTRAST:  None required FLUOROSCOPY TIME:  None utilized COMPLICATIONS: None immediate. TECHNIQUE: The patient was positioned supine . Pulmonary arterial pressure min measurements via indwelling pulmonary arterial catheters, 51/27 (37) mmHg. At this point, the procedure was terminated. All wires, catheters and sheaths were removed from the patient. Hemostasis was achieved at the right IJ access sites with manual compression. A dressing was placed. The patient tolerated the procedure well without immediate postprocedural complication. IMPRESSION: Technically successful bilateral catheter directed pulmonary arterial thrombolysis with mild-moderate residual pulmonary hypertension. Electronically Signed   By: Corlis Leak M.D.   On: 03/30/2019 12:46  Scheduled Meds:  amLODipine  5 mg Oral Daily   mouth rinse  15 mL Mouth Rinse BID   pantoprazole  40 mg Oral Daily   sodium chloride flush  3 mL Intravenous Q12H   Continuous Infusions:  sodium chloride     sodium chloride     sodium chloride Stopped (03/30/19 0926)   sodium chloride Stopped (03/30/19 0926)   sodium chloride     alteplase (tPA/ ACTIVASE) PE Lysis 12 mg/250 mL (BILATERAL)     alteplase (tPA/ ACTIVASE) PE Lysis 12 mg/250 mL (BILATERAL)     heparin  2,500 Units/hr (03/30/19 1200)   lactated ringers 10 mL/hr at 03/30/19 1200     LOS: 2 days   Rickey BarbaraStephen Zykerria Tanton, MD [facility] Pager On Amion  If 7PM-7AM, please contact night-coverage 03/31/2019, 11:22 AM

## 2019-03-31 NOTE — TOC Benefit Eligibility Note (Signed)
Transition of Care [facility]) Benefit Eligibility Note    Patient Details  Name: Susan Warren MRN: [id number] Date of Birth: 1976/07/05   Medication/Dose: ELIQUIS  2.5 MG BID   ELIQUIS  5 MG BID      ELIQUIS  10 MG - NON-FORMULARY($ 55.78 FOR EACH PRESCRIPTION)  Covered?: Yes     Prescription Coverage [facility]  Spoke with Person/Company/Phone Number:: YOW([facility] # [phone] OPT- [employer]BER)  Co-Pay: $ 50.00(XARELTO  15 MG BID  AND XARELTO 20 MG DAILY FOR EACH PRESCRIPTION)     Deductible: (OUT-OF-POCKET- NOT MET)       Memory Argue Phone Number: 03/31/2019, 1:25 PM

## 2019-03-31 NOTE — TOC Initial Note (Signed)
Transition of Care [facility]) - Initial/Assessment Note    Patient Details  Name: ALIJA CANTRELLE MRN: [id number] Date of Birth: Jul 23, 1976  Transition of Care [facility]) CM/SW Contact:    Lawerance Sabal, RN Phone Number: 03/31/2019, 2:49 PM  Clinical Narrative:           Sherron Monday w patient at bedside. She was provided with Lesia Hausen 30 day and $10 copay cards.          Expected Discharge Plan: Home/Self Care Barriers to Discharge: Continued Medical Work up   Patient Goals and CMS Choice        Expected Discharge Plan and Services Expected Discharge Plan: Home/Self Care                                  Prior Living Arrangements/Services   Lives with:: Spouse                   Activities of Daily Living      Permission Sought/Granted                  Emotional Assessment              Admission diagnosis:  Pulmonary embolism (HCC) [I26.99] Other acute pulmonary embolism, unspecified whether acute cor pulmonale present (HCC) [I26.99] Patient Active Problem List   Diagnosis Date Noted  . Acute massive pulmonary embolism (HCC) 03/29/2019  . Angioedema 04/07/2015  . Preventative health care 01/25/2014  . Hypertension   . Depression   . GERD (gastroesophageal reflux disease)   . Allergy   . Migraines   . Anxiety   . IUD (intrauterine device) in place 03/10/2013   PCP:  Patient, No Pcp Per Pharmacy:   [id number] [place], [place] - [phone] AT [facility] [address] [place] [address] Phone: [phone] Fax: [phone]     Social Determinants of Health (SDOH) Interventions    Readmission Risk Interventions No flowsheet data found.

## 2019-03-31 NOTE — Progress Notes (Signed)
ANTICOAGULATION CONSULT NOTE - Follow Up Consult  Pharmacy Consult for heparin Indication: pulmonary embolus  Labs: Recent Labs    03/29/19 1008  03/30/19 0420  03/30/19 1030 03/30/19 1806 03/31/19 0236  HGB 13.9   < > 12.1  --  11.8*  --  11.3*  HCT 43.2   < > 37.0  --  36.9  --  35.2*  PLT 278   < > 200  --  192  --  174  LABPROT 13.8  --   --   --   --   --   --   INR 1.1  --   --   --   --   --   --   HEPARINUNFRC  --    < >  --    < > 0.26* 0.37 0.34  CREATININE 1.08*  --  0.82  --   --   --   --   TROPONINI 0.76*  --   --   --   --   --   --    < > = values in this interval not displayed.    Assessment/Plan:  43yo female remains therapeutic on heparin though at lower end of goal. Hgb/Plt trending down but RN reports no overt signs of bleeding. Will continue gtt at current rate for now and f/u w/ daytime team.   Vernard Gambles, PharmD, BCPS  03/31/2019,3:40 AM

## 2019-03-31 NOTE — Care Management (Signed)
Submitted benefit check for xaralto and eliquis.

## 2019-04-01 LAB — CBC
HCT: 35 % — ABNORMAL LOW (ref 36.0–46.0)
Hemoglobin: 11.5 g/dL — ABNORMAL LOW (ref 12.0–15.0)
MCH: 29.2 pg (ref 26.0–34.0)
MCHC: 32.9 g/dL (ref 30.0–36.0)
MCV: 88.8 fL (ref 80.0–100.0)
Platelets: 179 10*3/uL (ref 150–400)
RBC: 3.94 MIL/uL (ref 3.87–5.11)
RDW: 14.4 % (ref 11.5–15.5)
WBC: 11.8 10*3/uL — ABNORMAL HIGH (ref 4.0–10.5)
nRBC: 0 % (ref 0.0–0.2)

## 2019-04-01 LAB — HEPARIN LEVEL (UNFRACTIONATED): Heparin Unfractionated: 0.42 IU/mL (ref 0.30–0.70)

## 2019-04-01 LAB — CALCIUM, IONIZED: Calcium, Ionized, Serum: 4.5 mg/dL (ref 4.5–5.6)

## 2019-04-01 MED ORDER — WARFARIN SODIUM 10 MG PO TABS
10.0000 mg | ORAL_TABLET | Freq: Once | ORAL | Status: AC
  Administered 2019-04-01: 16:00:00 10 mg via ORAL
  Filled 2019-04-01: qty 1

## 2019-04-01 MED ORDER — COUMADIN BOOK
Freq: Once | Status: AC
  Administered 2019-04-01: 15:00:00

## 2019-04-01 MED ORDER — WARFARIN - PHARMACIST DOSING INPATIENT
Freq: Every day | Status: DC
  Administered 2019-04-01: 16:00:00

## 2019-04-01 MED ORDER — HYDROCODONE-ACETAMINOPHEN 5-325 MG PO TABS
1.0000 | ORAL_TABLET | Freq: Four times a day (QID) | ORAL | Status: DC | PRN
  Administered 2019-04-01 – 2019-04-04 (×4): 2 via ORAL
  Filled 2019-04-01 (×4): qty 2

## 2019-04-01 NOTE — Care Management (Signed)
Spoke w [facility] pharmacy and patient. Arranged to have meds for DC sent to bedside. Follow up appointment scheduled and patient notified.

## 2019-04-01 NOTE — Plan of Care (Signed)
  Problem: Education: Goal: Knowledge of General Education information will improve Description Including pain rating scale, medication(s)/side effects and non-pharmacologic comfort measures Outcome: Progressing   Problem: Health Behavior/Discharge Planning: Goal: Ability to manage health-related needs will improve Outcome: Progressing   Problem: Clinical Measurements: Goal: Ability to maintain clinical measurements within normal limits will improve Outcome: Progressing Goal: Will remain free from infection Outcome: Progressing Goal: Diagnostic test results will improve Outcome: Progressing Goal: Respiratory complications will improve Outcome: Progressing Goal: Cardiovascular complication will be avoided Outcome: Progressing   Problem: Pain Managment: Goal: General experience of comfort will improve Outcome: Progressing   Problem: Nutrition: Goal: Adequate nutrition will be maintained Outcome: Completed/Met

## 2019-04-01 NOTE — Progress Notes (Signed)
ANTICOAGULATION CONSULT NOTE - Follow Up Consult  Pharmacy Consult for heparin/coumadin  Indication: pulmonary embolus w/ EKOS  Labs: Recent Labs    03/29/19 1008  03/30/19 0420  03/30/19 1030 03/30/19 1806 03/31/19 0236 04/01/19 0430  HGB 13.9   < > 12.1  --  11.8*  --  11.3* 11.5*  HCT 43.2   < > 37.0  --  36.9  --  35.2* 35.0*  PLT 278   < > 200  --  192  --  174 179  LABPROT 13.8  --   --   --   --   --   --   --   INR 1.1  --   --   --   --   --   --   --   HEPARINUNFRC  --    < >  --    < > 0.26* 0.37 0.34 0.42  CREATININE 1.08*  --  0.82  --   --   --  0.76  --   TROPONINI 0.76*  --   --   --   --   --   --   --    < > = values in this interval not displayed.    Assessment: 43yo female on heparin for bilateral PE s/p EKOS on 4/7. Pharmacy consulted to add warfarin. With BMI ~ 85, a DOAC is not recommended (limited experience) -heparin level= 0.42, INR= 1.1 -Hg= 11.5   Goal of Therapy:  Heparin level 0.3-0.7 units/ml   Plan:  -Continue heparin 2500 units/hr -Daily heparin level and CBC -Warfarin 10mg  po today -Daily PT/INR  Harland German, PharmD Clinical Pharmacist **Pharmacist phone directory can now be found on [email or web address] ([email or web address]).  Listed under [facility].

## 2019-04-01 NOTE — Progress Notes (Signed)
PROGRESS NOTE    Susan Warren  ZOX:[id number] DOB: 1976/01/03 DOA: 03/29/2019 PCP: Patient, No Pcp Per    Brief Narrative:  43yo with hx limited mobility, morbid obesity, recent R toe fracture which limits mobility presented with acute hypoxemic respiratory failure, found to have massive acute bilateral PE  Assessment & Plan:   Active Problems:   IUD (intrauterine device) in place   Acute massive pulmonary embolism (HCC)   1. Acute hypoxemic respiratory failure 1. O2 weaned to room air 2. Remains stable at this time. 2. Acute B PE 1. Currently continued on heparin gtt 2. Pt is s/p thrombolysis 4/6-4/7 3. Per Pulmonary, will require lifelong anticoagulation 4. Given morbid obesity, NOAC not recommended. Will begin therapeutic coumadin with goal of INR 2-3. Discussed with pharmacy 5. Given clot burden, will need to be very careful with coumadin dosing. Anticipate discharge home when INR therapeutic x at least full 24hrs 3. Morbid obesity 1. Remains stable at this time 2. Continue with die/lifestyle modification  DVT prophylaxis: heparin gtt Code Status: Full Family Communication: Pt in room, family not at bedside, pt reports updating family on her own Disposition Plan: Possible d/c home in 24hrs  Consultants:   CCM  Procedures:   Thrombolysis  Antimicrobials: Anti-infectives (From admission, onward)   None      Subjective: Reports feeling well  Objective: Vitals:   03/31/19 2158 04/01/19 0435 04/01/19 0436 04/01/19 1056  BP: (!) 134/92 134/73  (!) 164/99  Pulse: 88 89    Resp: 17 (!) 21    Temp: 97.8 F (36.6 C) 98.1 F (36.7 C)    TempSrc: Oral Oral    SpO2: 99% 95%    Weight:   (!) 227.3 kg   Height:        Intake/Output Summary (Last 24 hours) at 04/01/2019 1124 Last data filed at 04/01/2019 [id number] Gross per 24 hour  Intake 1541.03 ml  Output 900 ml  Net 641.03 ml   Filed Weights   03/30/19 0500 03/31/19 0403 04/01/19 0436  Weight: (!) 222.6 kg  (!) 223.2 kg (!) 227.3 kg    Examination: General exam: Awake, laying in bed, in nad Respiratory system: Normal respiratory effort, no wheezing Cardiovascular system: regular rate, s1, s2 Gastrointestinal system: Soft, nondistended, positive BS, morbidly obese Central nervous system: CN2-12 grossly intact, strength intact Extremities: Perfused, no clubbing Skin: Normal skin turgor, no notable skin lesions seen Psychiatry: Mood normal // no visual hallucinations   Data Reviewed: I have personally reviewed following labs and imaging studies  CBC: Recent Labs  Lab 03/29/19 2217 03/30/19 0420 03/30/19 1030 03/31/19 0236 04/01/19 0430  WBC 16.9* 15.4* 15.0* 13.6* 11.8*  HGB 12.3 12.1 11.8* 11.3* 11.5*  HCT 38.3 37.0 36.9 35.2* 35.0*  MCV 88.7 89.8 89.6 88.7 88.8  PLT 233 200 192 174 179   Basic Metabolic Panel: Recent Labs  Lab 03/29/19 1008 03/30/19 0420 03/31/19 0236  NA 131* 136 133*  K 3.5 3.4* 3.7  CL 104 105 107  CO2 21* 22 20*  GLUCOSE 170* 120* 115*  BUN 16 16 14   CREATININE 1.08* 0.82 0.76  CALCIUM 8.5* 8.5* 8.3*  MG  --   --  1.7   GFR: Estimated Creatinine Clearance: 173.4 mL/min (by C-G formula based on SCr of 0.76 mg/dL). Liver Function Tests: Recent Labs  Lab 03/29/19 1008 03/31/19 0236  AST 29 36  ALT 19 29  ALKPHOS 87 69  BILITOT 1.4* 1.0  PROT 8.5* 6.6  ALBUMIN  3.6 2.9*   No results for input(s): LIPASE, AMYLASE in the last 168 hours. No results for input(s): AMMONIA in the last 168 hours. Coagulation Profile: Recent Labs  Lab 03/29/19 1008  INR 1.1   Cardiac Enzymes: Recent Labs  Lab 03/29/19 1008  TROPONINI 0.76*   BNP (last 3 results) No results for input(s): PROBNP in the last 8760 hours. HbA1C: No results for input(s): HGBA1C in the last 72 hours. CBG: Recent Labs  Lab 03/29/19 1702  GLUCAP 165*   Lipid Profile: No results for input(s): CHOL, HDL, LDLCALC, TRIG, CHOLHDL, LDLDIRECT in the last 72 hours. Thyroid  Function Tests: Recent Labs    03/30/19 0422  TSH 1.869   Anemia Panel: No results for input(s): VITAMINB12, FOLATE, FERRITIN, TIBC, IRON, RETICCTPCT in the last 72 hours. Sepsis Labs: No results for input(s): PROCALCITON, LATICACIDVEN in the last 168 hours.  Recent Results (from the past 240 hour(s))  MRSA PCR Screening     Status: None   Collection Time: 03/29/19  5:56 PM  Result Value Ref Range Status   MRSA by PCR NEGATIVE NEGATIVE Final    Comment:        The GeneXpert MRSA Assay (FDA approved for NASAL specimens only), is one component of a comprehensive MRSA colonization surveillance program. It is not intended to diagnose MRSA infection nor to guide or monitor treatment for MRSA infections. Performed at [facility], [address] [place], [place] [address]      Radiology Studies: Dg Chest Port 1 View  Result Date: 03/31/2019 CLINICAL DATA:  Shortness of Breath EXAM: PORTABLE CHEST 1 VIEW COMPARISON:  03/29/2019 FINDINGS: Cardiac shadow is stable. The lungs are well aerated bilaterally. No focal infiltrate or sizable effusion is seen. No acute bony abnormality is noted. IMPRESSION: No active disease. Electronically Signed   By: Alcide Clever M.D.   On: 03/31/2019 08:22    Scheduled Meds:  amLODipine  5 mg Oral Daily   mouth rinse  15 mL Mouth Rinse BID   sodium chloride flush  3 mL Intravenous Q12H   warfarin  10 mg Oral ONCE-1800   Warfarin - Pharmacist Dosing Inpatient   Does not apply q1800   Continuous Infusions:  sodium chloride     sodium chloride     sodium chloride Stopped (03/30/19 0926)   sodium chloride Stopped (03/30/19 0926)   sodium chloride     heparin 2,500 Units/hr (03/31/19 2137)   lactated ringers Stopped (03/30/19 1410)     LOS: 3 days   Rickey Barbara, MD [facility] Pager On Amion  If 7PM-7AM, please contact night-coverage 04/01/2019, 11:24 AM

## 2019-04-01 NOTE — Discharge Instructions (Signed)

## 2019-04-02 DIAGNOSIS — Z975 Presence of (intrauterine) contraceptive device: Secondary | ICD-10-CM

## 2019-04-02 LAB — HEPARIN LEVEL (UNFRACTIONATED): Heparin Unfractionated: 0.4 IU/mL (ref 0.30–0.70)

## 2019-04-02 LAB — CBC
HCT: 35.3 % — ABNORMAL LOW (ref 36.0–46.0)
Hemoglobin: 11.5 g/dL — ABNORMAL LOW (ref 12.0–15.0)
MCH: 29.2 pg (ref 26.0–34.0)
MCHC: 32.6 g/dL (ref 30.0–36.0)
MCV: 89.6 fL (ref 80.0–100.0)
Platelets: 179 10*3/uL (ref 150–400)
RBC: 3.94 MIL/uL (ref 3.87–5.11)
RDW: 14.6 % (ref 11.5–15.5)
WBC: 11 10*3/uL — ABNORMAL HIGH (ref 4.0–10.5)
nRBC: 0 % (ref 0.0–0.2)

## 2019-04-02 LAB — PROTIME-INR
INR: 1.1 (ref 0.8–1.2)
Prothrombin Time: 14.3 seconds (ref 11.4–15.2)

## 2019-04-02 MED ORDER — WARFARIN SODIUM 10 MG PO TABS
10.0000 mg | ORAL_TABLET | Freq: Once | ORAL | Status: AC
  Administered 2019-04-02: 10 mg via ORAL
  Filled 2019-04-02: qty 1

## 2019-04-02 NOTE — Progress Notes (Signed)
PROGRESS NOTE    Susan Warren  DXI:[id number] DOB: 08-09-1976 DOA: 03/29/2019 PCP: Patient, No Pcp Per    Brief Narrative:  43yo with hx limited mobility, morbid obesity, recent R toe fracture which limits mobility presented with acute hypoxemic respiratory failure, found to have massive acute bilateral PE  Assessment & Plan:   Active Problems:   IUD (intrauterine device) in place   Acute massive pulmonary embolism (HCC)  1. Acute hypoxemic respiratory failure 1. O2 weaned to room air 2. Remains stable at this time. 2. Acute B PE 1. Currently continued on heparin gtt 2. Pt is s/p thrombolysis 4/6-4/7 3. Per Pulmonary, will require lifelong anticoagulation 4. Given morbid obesity, NOAC not recommended. Will begin therapeutic coumadin with goal of INR 2-3. Discussed with pharmacy 5. Given clot burden, continue with coumadin dosing per pharmacy. INR 1.1 3. Morbid obesity 1. Remains stable at this time 2. Recommend diet/lifestyle modification  DVT prophylaxis: heparin gtt Code Status: Full Family Communication: Pt in room, family not at bedside, pt reports updating family on her own Disposition Plan: Possible d/c home in 24hrs  Consultants:   CCM  Procedures:   Thrombolysis  Antimicrobials: Anti-infectives (From admission, onward)   None      Subjective: Without complaints  Objective: Vitals:   04/01/19 1604 04/01/19 2050 04/02/19 0447 04/02/19 0942  BP: (!) 140/95 (!) 164/80 113/76 (!) 147/85  Pulse: 88 90 (!) 115   Resp: 17 19    Temp: 97.7 F (36.5 C) 98.3 F (36.8 C) 98 F (36.7 C)   TempSrc: Oral Oral Oral   SpO2: 95% 97% 98%   Weight:      Height:        Intake/Output Summary (Last 24 hours) at 04/02/2019 1445 Last data filed at 04/02/2019 1028 Gross per 24 hour  Intake 1201.67 ml  Output 300 ml  Net 901.67 ml   Filed Weights   03/30/19 0500 03/31/19 0403 04/01/19 0436  Weight: (!) 222.6 kg (!) 223.2 kg (!) 227.3 kg    Examination:  General exam: Conversant, in no acute distress Respiratory system: normal chest rise, clear, no audible wheezing Cardiovascular system: regular rhythm, s1-s2 Gastrointestinal system: Nondistended, nontender, pos BS, morbidly obese Central nervous system: No seizures, no tremors Extremities: No cyanosis, no joint deformities Skin: No rashes, no pallor Psychiatry: Affect normal // no auditory hallucinations   Data Reviewed: I have personally reviewed following labs and imaging studies  CBC: Recent Labs  Lab 03/30/19 0420 03/30/19 1030 03/31/19 0236 04/01/19 0430 04/02/19 0510  WBC 15.4* 15.0* 13.6* 11.8* 11.0*  HGB 12.1 11.8* 11.3* 11.5* 11.5*  HCT 37.0 36.9 35.2* 35.0* 35.3*  MCV 89.8 89.6 88.7 88.8 89.6  PLT 200 192 174 179 179   Basic Metabolic Panel: Recent Labs  Lab 03/29/19 1008 03/30/19 0420 03/31/19 0236  NA 131* 136 133*  K 3.5 3.4* 3.7  CL 104 105 107  CO2 21* 22 20*  GLUCOSE 170* 120* 115*  BUN 16 16 14   CREATININE 1.08* 0.82 0.76  CALCIUM 8.5* 8.5* 8.3*  MG  --   --  1.7   GFR: Estimated Creatinine Clearance: 173.4 mL/min (by C-G formula based on SCr of 0.76 mg/dL). Liver Function Tests: Recent Labs  Lab 03/29/19 1008 03/31/19 0236  AST 29 36  ALT 19 29  ALKPHOS 87 69  BILITOT 1.4* 1.0  PROT 8.5* 6.6  ALBUMIN 3.6 2.9*   No results for input(s): LIPASE, AMYLASE in the last 168 hours. No  results for input(s): AMMONIA in the last 168 hours. Coagulation Profile: Recent Labs  Lab 03/29/19 1008 04/02/19 0510  INR 1.1 1.1   Cardiac Enzymes: Recent Labs  Lab 03/29/19 1008  TROPONINI 0.76*   BNP (last 3 results) No results for input(s): PROBNP in the last 8760 hours. HbA1C: No results for input(s): HGBA1C in the last 72 hours. CBG: Recent Labs  Lab 03/29/19 1702  GLUCAP 165*   Lipid Profile: No results for input(s): CHOL, HDL, LDLCALC, TRIG, CHOLHDL, LDLDIRECT in the last 72 hours. Thyroid Function Tests: No results for input(s):  TSH, T4TOTAL, FREET4, T3FREE, THYROIDAB in the last 72 hours. Anemia Panel: No results for input(s): VITAMINB12, FOLATE, FERRITIN, TIBC, IRON, RETICCTPCT in the last 72 hours. Sepsis Labs: No results for input(s): PROCALCITON, LATICACIDVEN in the last 168 hours.  Recent Results (from the past 240 hour(s))  MRSA PCR Screening     Status: None   Collection Time: 03/29/19  5:56 PM  Result Value Ref Range Status   MRSA by PCR NEGATIVE NEGATIVE Final    Comment:        The GeneXpert MRSA Assay (FDA approved for NASAL specimens only), is one component of a comprehensive MRSA colonization surveillance program. It is not intended to diagnose MRSA infection nor to guide or monitor treatment for MRSA infections. Performed at [facility], [address] [place], [place] [address]      Radiology Studies: No results found.  Scheduled Meds: . amLODipine  5 mg Oral Daily  . mouth rinse  15 mL Mouth Rinse BID  . warfarin  10 mg Oral ONCE-1800  . Warfarin - Pharmacist Dosing Inpatient   Does not apply q1800   Continuous Infusions: . heparin 2,500 Units/hr (04/02/19 1120)  . lactated ringers Stopped (03/30/19 1410)     LOS: 4 days   Rickey BarbaraStephen Eiden Bagot, MD [facility] Pager On Amion  If 7PM-7AM, please contact night-coverage 04/02/2019, 2:45 PM

## 2019-04-02 NOTE — Progress Notes (Signed)
ANTICOAGULATION CONSULT NOTE - Follow Up Consult  Pharmacy Consult for heparin/coumadin  Indication: pulmonary embolus w/ EKOS  Labs: Recent Labs    03/31/19 0236 04/01/19 0430 04/02/19 0510  HGB 11.3* 11.5* 11.5*  HCT 35.2* 35.0* 35.3*  PLT 174 179 179  LABPROT  --   --  14.3  INR  --   --  1.1  HEPARINUNFRC 0.34 0.42 0.40  CREATININE 0.76  --   --     Assessment: 43yo female on heparin for bilateral PE s/p EKOS on 4/7. Pharmacy consulted dose warfarin (day 2 of overlap with heparin). With BMI ~ 85, a DOAC is not recommended (limited experience) -heparin level= 0.4, INR= 1.1 -Hg= 11.5   Goal of Therapy:  Heparin level 0.3-0.7 units/ml   Plan:  -Continue heparin 2500 units/hr -Daily heparin level and CBC -Warfarin 10mg  po today  -Daily PT/INR  Harland German, PharmD Clinical Pharmacist **Pharmacist phone directory can now be found on [email or web address] ([email or web address]).  Listed under [facility].

## 2019-04-03 LAB — CBC
HCT: 36.1 % (ref 36.0–46.0)
Hemoglobin: 11.7 g/dL — ABNORMAL LOW (ref 12.0–15.0)
MCH: 28.8 pg (ref 26.0–34.0)
MCHC: 32.4 g/dL (ref 30.0–36.0)
MCV: 88.9 fL (ref 80.0–100.0)
Platelets: 182 10*3/uL (ref 150–400)
RBC: 4.06 MIL/uL (ref 3.87–5.11)
RDW: 14.6 % (ref 11.5–15.5)
WBC: 10.8 10*3/uL — ABNORMAL HIGH (ref 4.0–10.5)
nRBC: 0 % (ref 0.0–0.2)

## 2019-04-03 LAB — PROTIME-INR
INR: 1.4 — ABNORMAL HIGH (ref 0.8–1.2)
Prothrombin Time: 16.6 s — ABNORMAL HIGH (ref 11.4–15.2)

## 2019-04-03 LAB — GLUCOSE, CAPILLARY: Glucose-Capillary: 87 mg/dL (ref 70–99)

## 2019-04-03 LAB — HEPARIN LEVEL (UNFRACTIONATED): Heparin Unfractionated: 0.31 [IU]/mL (ref 0.30–0.70)

## 2019-04-03 MED ORDER — WARFARIN SODIUM 10 MG PO TABS
10.0000 mg | ORAL_TABLET | Freq: Once | ORAL | Status: DC
  Filled 2019-04-03: qty 1

## 2019-04-03 NOTE — Progress Notes (Signed)
PROGRESS NOTE    Susan Warren  CBS:[id number] DOB: 22-Jul-1976 DOA: 03/29/2019 PCP: Patient, No Pcp Per    Brief Narrative:  43yo with hx limited mobility, morbid obesity, recent R toe fracture which limits mobility presented with acute hypoxemic respiratory failure, found to have massive acute bilateral PE  Assessment & Plan:   Active Problems:   IUD (intrauterine device) in place   Acute massive pulmonary embolism (HCC)  1. Acute hypoxemic respiratory failure 1. O2 weaned to room air 2. Currently stable 2. Acute B PE 1. Currently continued on heparin gtt 2. Pt is s/p thrombolysis 4/6-4/7 3. Per Pulmonary, will require lifelong anticoagulation 4. Given morbid obesity, NOAC not recommended. Now on therapeutic coumadin with goal of INR 2-3. 5. INR today 1.4. Continue dosing per pharmacy 3. Morbid obesity 1. Remains stable at this time 2. Recommend diet/lifestyle modification  DVT prophylaxis: heparin gtt, coumadin Code Status: Full Family Communication: Pt in room, family not at bedside Disposition Plan: Possible d/c home when INR therapeutic  Consultants:   CCM  Procedures:   Thrombolysis  Antimicrobials: Anti-infectives (From admission, onward)   None      Subjective: Complains of tenderness over bruise site over thigh  Objective: Vitals:   04/02/19 2057 04/03/19 0531 04/03/19 0534 04/03/19 0839  BP: (!) 143/67 (!) 155/86  130/72  Pulse: 83 94    Resp:  15    Temp: (!) 97.5 F (36.4 C) 97.6 F (36.4 C)    TempSrc: Oral Oral    SpO2: 100% 100%    Weight:   (!) 204.1 kg   Height:        Intake/Output Summary (Last 24 hours) at 04/03/2019 1118 Last data filed at 04/03/2019 0600 Gross per 24 hour  Intake 1200 ml  Output -  Net 1200 ml   Filed Weights   03/31/19 0403 04/01/19 0436 04/03/19 0534  Weight: (!) 223.2 kg (!) 227.3 kg (!) 204.1 kg    Examination: General exam: Awake, laying in bed, in nad Respiratory system: Normal respiratory  effort, no wheezing Cardiovascular system: regular rate, s1, s2 Gastrointestinal system: Soft, nondistended, positive BS, morbidly obese Central nervous system: CN2-12 grossly intact, strength intact Extremities: Perfused, no clubbing Skin: Normal skin turgor, no notable skin lesions seen, ecchymosis over R thigh Psychiatry: Mood normal // no visual hallucinations   Data Reviewed: I have personally reviewed following labs and imaging studies  CBC: Recent Labs  Lab 03/30/19 1030 03/31/19 0236 04/01/19 0430 04/02/19 0510 04/03/19 0244  WBC 15.0* 13.6* 11.8* 11.0* 10.8*  HGB 11.8* 11.3* 11.5* 11.5* 11.7*  HCT 36.9 35.2* 35.0* 35.3* 36.1  MCV 89.6 88.7 88.8 89.6 88.9  PLT 192 174 179 179 182   Basic Metabolic Panel: Recent Labs  Lab 03/29/19 1008 03/30/19 0420 03/31/19 0236  NA 131* 136 133*  K 3.5 3.4* 3.7  CL 104 105 107  CO2 21* 22 20*  GLUCOSE 170* 120* 115*  BUN 16 16 14   CREATININE 1.08* 0.82 0.76  CALCIUM 8.5* 8.5* 8.3*  MG  --   --  1.7   GFR: Estimated Creatinine Clearance: 160.5 mL/min (by C-G formula based on SCr of 0.76 mg/dL). Liver Function Tests: Recent Labs  Lab 03/29/19 1008 03/31/19 0236  AST 29 36  ALT 19 29  ALKPHOS 87 69  BILITOT 1.4* 1.0  PROT 8.5* 6.6  ALBUMIN 3.6 2.9*   No results for input(s): LIPASE, AMYLASE in the last 168 hours. No results for input(s): AMMONIA in  the last 168 hours. Coagulation Profile: Recent Labs  Lab 03/29/19 1008 04/02/19 0510 04/03/19 0244  INR 1.1 1.1 1.4*   Cardiac Enzymes: Recent Labs  Lab 03/29/19 1008  TROPONINI 0.76*   BNP (last 3 results) No results for input(s): PROBNP in the last 8760 hours. HbA1C: No results for input(s): HGBA1C in the last 72 hours. CBG: Recent Labs  Lab 03/29/19 1702 04/03/19 0528  GLUCAP 165* 87   Lipid Profile: No results for input(s): CHOL, HDL, LDLCALC, TRIG, CHOLHDL, LDLDIRECT in the last 72 hours. Thyroid Function Tests: No results for input(s): TSH,  T4TOTAL, FREET4, T3FREE, THYROIDAB in the last 72 hours. Anemia Panel: No results for input(s): VITAMINB12, FOLATE, FERRITIN, TIBC, IRON, RETICCTPCT in the last 72 hours. Sepsis Labs: No results for input(s): PROCALCITON, LATICACIDVEN in the last 168 hours.  Recent Results (from the past 240 hour(s))  MRSA PCR Screening     Status: None   Collection Time: 03/29/19  5:56 PM  Result Value Ref Range Status   MRSA by PCR NEGATIVE NEGATIVE Final    Comment:        The GeneXpert MRSA Assay (FDA approved for NASAL specimens only), is one component of a comprehensive MRSA colonization surveillance program. It is not intended to diagnose MRSA infection nor to guide or monitor treatment for MRSA infections. Performed at [facility], [address] [place], [place] [address]      Radiology Studies: No results found.  Scheduled Meds: . amLODipine  5 mg Oral Daily  . mouth rinse  15 mL Mouth Rinse BID  . warfarin  10 mg Oral ONCE-1800  . Warfarin - Pharmacist Dosing Inpatient   Does not apply q1800   Continuous Infusions: . heparin 2,500 Units/hr (04/03/19 0027)  . lactated ringers Stopped (03/30/19 1410)     LOS: 5 days   Rickey BarbaraStephen Naama Sappington, MD [facility] Pager On Amion  If 7PM-7AM, please contact night-coverage 04/03/2019, 11:18 AM

## 2019-04-03 NOTE — Progress Notes (Signed)
ANTICOAGULATION CONSULT NOTE - Follow Up Consult  Pharmacy Consult for heparin/coumadin  Indication: pulmonary embolus w/ EKOS  Patient Measurements: Height: 5\' 4"  (162.6 cm) Weight: (!) 450 lb (204.1 kg)(pt refused to stand bed needs to be zeroed) IBW/kg (Calculated) : 54.7 Heparin Dosing Weight:109.4 kg    Labs: Recent Labs    04/01/19 0430 04/02/19 0510 04/03/19 0244  HGB 11.5* 11.5* 11.7*  HCT 35.0* 35.3* 36.1  PLT 179 179 182  LABPROT  --  14.3 16.6*  INR  --  1.1 1.4*  HEPARINUNFRC 0.42 0.40 0.31    Assessment: 43yo female on heparin for massive acute bilateral PE s/p EKOS on 4/7. Pharmacy consulted dose warfarin (day 2 of overlap with heparin). With BMI ~ 77, a DOAC is not recommended (limited experience).  Day #3/5 overlap heparin>warfarin.  -heparin level= 0.31 on heparin drip rate 2500 units/hr, level is therapeutic.   INR= 1.4 today -Hg= 11.7 stable,  pltc wnl, no bleeding noted. Heparin level is therapeutic although has dropped to low end of goal.  Will increase heparin rate slightly to keep HL within goal as INR has not reached therapeutic range yet.     Goal of Therapy:  Heparin level 0.3-0.7 units/ml  INR goal 2-3   Plan:  -Increase heparin to 2600 units/hr -Daily heparin level and CBC -Warfarin 10mg  po today  -Daily PT/INR  Noah Delaine, [facility] Clinical Pharmacist **Pharmacist phone directory can now be found on [email or web address] ([email or web address]).  Listed under [facility]. 04/03/2019 10:57 AM

## 2019-04-04 LAB — CBC
HCT: 37.7 % (ref 36.0–46.0)
Hemoglobin: 12.1 g/dL (ref 12.0–15.0)
MCH: 28.6 pg (ref 26.0–34.0)
MCHC: 32.1 g/dL (ref 30.0–36.0)
MCV: 89.1 fL (ref 80.0–100.0)
Platelets: 188 10*3/uL (ref 150–400)
RBC: 4.23 MIL/uL (ref 3.87–5.11)
RDW: 14.8 % (ref 11.5–15.5)
WBC: 10 10*3/uL (ref 4.0–10.5)
nRBC: 0 % (ref 0.0–0.2)

## 2019-04-04 LAB — PROTIME-INR
INR: 2.2 — ABNORMAL HIGH (ref 0.8–1.2)
INR: 2.2 — ABNORMAL HIGH (ref 0.8–1.2)
Prothrombin Time: 24.4 seconds — ABNORMAL HIGH (ref 11.4–15.2)
Prothrombin Time: 24.4 seconds — ABNORMAL HIGH (ref 11.4–15.2)

## 2019-04-04 LAB — HEPARIN LEVEL (UNFRACTIONATED)
Heparin Unfractionated: 0.29 IU/mL — ABNORMAL LOW (ref 0.30–0.70)
Heparin Unfractionated: 0.48 IU/mL (ref 0.30–0.70)

## 2019-04-04 NOTE — Progress Notes (Signed)
PROGRESS NOTE    Susan Warren  WUJ:[id number] DOB: July 23, 1976 DOA: 03/29/2019 PCP: Patient, No Pcp Per    Brief Narrative:  43yo with hx limited mobility, morbid obesity, recent R toe fracture which limits mobility presented with acute hypoxemic respiratory failure, found to have massive acute bilateral PE  Assessment & Plan:   Active Problems:   IUD (intrauterine device) in place   Acute massive pulmonary embolism (HCC)  1. Acute hypoxemic respiratory failure 1. O2 weaned to room air 2. Currently stable 2. Acute B PE 1. Currently continued on heparin gtt 2. Pt is s/p thrombolysis 4/6-4/7 3. Per Pulmonary, will require lifelong anticoagulation 4. Given morbid obesity, NOAC not recommended. Now on therapeutic coumadin with goal of INR 2-3. 5. INR up to 2.2 today 6. Continue heparin/coumadin dosing per pharmacy 3. Morbid obesity 1. Remains stable at this time 2. Continue to recommend diet/lifestyle modification  DVT prophylaxis: heparin gtt, coumadin Code Status: Full Family Communication: Pt in room, family not at bedside Disposition Plan: Possible d/c home when INR remains therapeutic range  Consultants:   CCM  Procedures:   Thrombolysis  Antimicrobials: Anti-infectives (From admission, onward)   None      Subjective: No complaints. States bruise on R thigh is improving  Objective: Vitals:   04/03/19 0839 04/03/19 1534 04/03/19 2114 04/04/19 0544  BP: 130/72 137/83 138/70 (!) 151/94  Pulse:  88 80 83  Resp:  20 (!) 23 17  Temp:  97.8 F (36.6 C) 98.4 F (36.9 C) 97.8 F (36.6 C)  TempSrc:  Oral Oral Oral  SpO2:  100% 100% 97%  Weight:      Height:        Intake/Output Summary (Last 24 hours) at 04/04/2019 1504 Last data filed at 04/04/2019 [id number] Gross per 24 hour  Intake 1550.46 ml  Output 600 ml  Net 950.46 ml   Filed Weights   03/31/19 0403 04/01/19 0436 04/03/19 0534  Weight: (!) 223.2 kg (!) 227.3 kg (!) 204.1 kg    Examination:  General exam: Awake, laying in bed, in nad Respiratory system: Normal respiratory effort, no wheezing Cardiovascular system: regular rate, s1, s2 Gastrointestinal system: Soft, nondistended, positive BS, morbidly obese Central nervous system: CN2-12 grossly intact, strength intact Extremities: Perfused, no clubbing Skin: Normal skin turgor, no notable skin lesions seen, ecchymosis over R thigh improving Psychiatry: Mood normal // no visual hallucinations   Data Reviewed: I have personally reviewed following labs and imaging studies  CBC: Recent Labs  Lab 03/31/19 0236 04/01/19 0430 04/02/19 0510 04/03/19 0244 04/04/19 0516  WBC 13.6* 11.8* 11.0* 10.8* 10.0  HGB 11.3* 11.5* 11.5* 11.7* 12.1  HCT 35.2* 35.0* 35.3* 36.1 37.7  MCV 88.7 88.8 89.6 88.9 89.1  PLT 174 179 179 182 188   Basic Metabolic Panel: Recent Labs  Lab 03/29/19 1008 03/30/19 0420 03/31/19 0236  NA 131* 136 133*  K 3.5 3.4* 3.7  CL 104 105 107  CO2 21* 22 20*  GLUCOSE 170* 120* 115*  BUN 16 16 14   CREATININE 1.08* 0.82 0.76  CALCIUM 8.5* 8.5* 8.3*  MG  --   --  1.7   GFR: Estimated Creatinine Clearance: 160.5 mL/min (by C-G formula based on SCr of 0.76 mg/dL). Liver Function Tests: Recent Labs  Lab 03/29/19 1008 03/31/19 0236  AST 29 36  ALT 19 29  ALKPHOS 87 69  BILITOT 1.4* 1.0  PROT 8.5* 6.6  ALBUMIN 3.6 2.9*   No results for input(s): LIPASE, AMYLASE  in the last 168 hours. No results for input(s): AMMONIA in the last 168 hours. Coagulation Profile: Recent Labs  Lab 03/29/19 1008 04/02/19 0510 04/03/19 0244 04/04/19 0516  INR 1.1 1.1 1.4* 2.2*   Cardiac Enzymes: Recent Labs  Lab 03/29/19 1008  TROPONINI 0.76*   BNP (last 3 results) No results for input(s): PROBNP in the last 8760 hours. HbA1C: No results for input(s): HGBA1C in the last 72 hours. CBG: Recent Labs  Lab 03/29/19 1702 04/03/19 0528  GLUCAP 165* 87   Lipid Profile: No results for input(s): CHOL, HDL,  LDLCALC, TRIG, CHOLHDL, LDLDIRECT in the last 72 hours. Thyroid Function Tests: No results for input(s): TSH, T4TOTAL, FREET4, T3FREE, THYROIDAB in the last 72 hours. Anemia Panel: No results for input(s): VITAMINB12, FOLATE, FERRITIN, TIBC, IRON, RETICCTPCT in the last 72 hours. Sepsis Labs: No results for input(s): PROCALCITON, LATICACIDVEN in the last 168 hours.  Recent Results (from the past 240 hour(s))  MRSA PCR Screening     Status: None   Collection Time: 03/29/19  5:56 PM  Result Value Ref Range Status   MRSA by PCR NEGATIVE NEGATIVE Final    Comment:        The GeneXpert MRSA Assay (FDA approved for NASAL specimens only), is one component of a comprehensive MRSA colonization surveillance program. It is not intended to diagnose MRSA infection nor to guide or monitor treatment for MRSA infections. Performed at [facility], [address], [place], [place] [address]      Radiology Studies: No results found.  Scheduled Meds: . amLODipine  5 mg Oral Daily  . mouth rinse  15 mL Mouth Rinse BID  . Warfarin - Pharmacist Dosing Inpatient   Does not apply q1800   Continuous Infusions: . heparin 2,750 Units/hr (04/04/19 1306)  . lactated ringers Stopped (03/30/19 1410)     LOS: 6 days   Rickey Barbara, MD [facility] Pager On Amion  If 7PM-7AM, please contact night-coverage 04/04/2019, 3:04 PM

## 2019-04-04 NOTE — Progress Notes (Addendum)
ANTICOAGULATION CONSULT NOTE - Follow Up Consult  Pharmacy Consult for heparin/coumadin  Indication: pulmonary embolus w/ EKOS  Patient Measurements: Height: 5\' 4"  (162.6 cm) Weight: (!) 450 lb (204.1 kg)(pt refused to stand bed needs to be zeroed) IBW/kg (Calculated) : 54.7 Heparin Dosing Weight:109.4 kg    Labs: Recent Labs    04/02/19 0510 04/03/19 0244 04/04/19 0516  HGB 11.5* 11.7* 12.1  HCT 35.3* 36.1 37.7  PLT 179 182 188  LABPROT 14.3 16.6* 24.4*  INR 1.1 1.4* 2.2*  HEPARINUNFRC 0.40 0.31 0.29*    Assessment: 43yo female on heparin for massive acute bilateral PE s/p EKOS on 4/7. Pharmacy consulted dose warfarin (day 2 of overlap with heparin). With BMI ~ 77, a DOAC is not recommended (limited experience).  Day #3/5 overlap heparin>warfarin.  -heparin level= 0.29 decreased slightly below goal despite  heparin drip rate increased yesterday to 2600 units/hr, level is therapeutic.   INR= increased significantly today from 1.4 to 2.2 today. Warfarin dose yesterday was not charted/ ?not given.  RN to inquire with patient. -Hg= 12.1 stable,  pltc wnl, no bleeding noted. No issues/interruptions with heparin infusion and no bleeding noted per RN.    Goal of Therapy:  Heparin level 0.3-0.7 units/ml  INR goal 2-3   Plan:  -Increase heparin to 2750 units/hr Check 6 hour heparin level Hold warfarin today -Daily heparin level and CBC -Daily PT/INR  Noah Delaine, [facility] Clinical Pharmacist **Pharmacist phone directory can now be found on [email or web address] ([email or web address]).  Listed under [facility]. 04/04/2019 9:35 AM   ADDENDUM:   RN spoke to patient regarding if warfarin dose given yesterday and patient reports that yes she received warfarin on 04/03/19.   Expect INR to increase further.  With significant increased in INR today, I will hold warfarin dose as noted above.  -Also MAR indicates heparin infusion stopped at 0559 this AM, no reason given.  Current shift RN reports heparin gtt was  infusing at correct rate when she arrived and patient reports that she is unaware why heparin was noted as stopped.  Will continue with heparin rate increase to 2750 units/hr and recheck heparin level in 6 hours.   Noah Delaine, RPh Clinical Pharmacist Pager: [phone] 04/04/2019 10:45 AM [phone]

## 2019-04-04 NOTE — Progress Notes (Signed)
ANTICOAGULATION CONSULT NOTE - Follow Up Consult  Pharmacy Consult for heparin/coumadin  Indication: pulmonary embolus w/ EKOS  Patient Measurements: Height: 5\' 4"  (162.6 cm) Weight: (!) 450 lb (204.1 kg)(pt refused to stand bed needs to be zeroed) IBW/kg (Calculated) : 54.7 Heparin Dosing Weight:109.4 kg    Labs: Recent Labs    04/02/19 0510 04/03/19 0244 04/04/19 0516 04/04/19 1547  HGB 11.5* 11.7* 12.1  --   HCT 35.3* 36.1 37.7  --   PLT 179 182 188  --   LABPROT 14.3 16.6* 24.4* 24.4*  INR 1.1 1.4* 2.2* 2.2*  HEPARINUNFRC 0.40 0.31 0.29* 0.48    Assessment: 43yo female on heparin for massive acute bilateral PE s/p EKOS on 4/7. Pharmacy consulted dose warfarin.  With BMI ~ 77, a DOAC is not recommended (limited experience).  Day #3/5 overlap heparin>warfarin.  -heparin level= 0.48 at goal on heparin drip rate 2750 uts/hr  INR  increased significantly today from 1.4 to 2.2 today. Warfarin dose yesterday was not charted - but confirmed with RN was given. Even with INR > 2 patient is not adequately anticoagulated and heparin should continue for minimum of 5 days with 48h overlap of INR > 2 -Hg= 12.1 stable,  pltc wnl, no bleeding noted. No issues/interruptions with heparin infusion and no bleeding noted per RN.    Goal of Therapy:  Heparin level 0.3-0.7 units/ml  INR goal 2-3   Plan:  -Continue heparin at 2750 units/hr Hold warfarin today -Daily heparin level and CBC -Daily PT/INR   Leota Sauers Pharm.D. CPP, BCPS Clinical Pharmacist [phone] 04/04/2019 5:42 PM

## 2019-04-05 ENCOUNTER — Encounter (HOSPITAL_COMMUNITY): Payer: Self-pay | Admitting: General Practice

## 2019-04-05 LAB — CBC
HCT: 38.4 % (ref 36.0–46.0)
Hemoglobin: 12.4 g/dL (ref 12.0–15.0)
MCH: 28.4 pg (ref 26.0–34.0)
MCHC: 32.3 g/dL (ref 30.0–36.0)
MCV: 87.9 fL (ref 80.0–100.0)
Platelets: 182 10*3/uL (ref 150–400)
RBC: 4.37 MIL/uL (ref 3.87–5.11)
RDW: 14.7 % (ref 11.5–15.5)
WBC: 9.9 10*3/uL (ref 4.0–10.5)
nRBC: 0 % (ref 0.0–0.2)

## 2019-04-05 LAB — PROTIME-INR
INR: 2.1 — ABNORMAL HIGH (ref 0.8–1.2)
Prothrombin Time: 23.6 seconds — ABNORMAL HIGH (ref 11.4–15.2)

## 2019-04-05 LAB — HEPARIN LEVEL (UNFRACTIONATED): Heparin Unfractionated: 0.54 IU/mL (ref 0.30–0.70)

## 2019-04-05 MED ORDER — WARFARIN SODIUM 5 MG PO TABS: 5.0000 mg | ORAL_TABLET | Freq: Once | ORAL | 0 refills | Status: DC

## 2019-04-05 MED ORDER — WARFARIN SODIUM 5 MG PO TABS: 5.0000 mg | ORAL_TABLET | Freq: Once | ORAL | Status: DC

## 2019-04-05 MED FILL — WARFARIN SODIUM 5 MG TABLET: 5 | 30 days supply | Qty: 30 | Fill #0

## 2019-04-05 NOTE — Discharge Summary (Signed)
Physician Discharge Summary  LETTA CARGILE ZOX:[id number] DOB: 1976-06-04 DOA: 03/29/2019  PCP: Patient, No Pcp Per  Admit date: 03/29/2019 Discharge date: 04/05/2019  Admitted From: Home Disposition:  home  Recommendations for Outpatient Follow-up:  1. Follow up with PCP in 1-2 weeks 2. Recommend repeat INR within 3-5 days  Discharge Condition:Stable CODE STATUS:Full Diet recommendation: Heart healthy   Brief/Interim Summary: 43yo with hx limited mobility, morbid obesity, recent R toe fracture which limits mobility presented with acute hypoxemic respiratory failure, found to have massive acute bilateral   Discharge Diagnoses:  Active Problems:   IUD (intrauterine device) in place   Acute massive pulmonary embolism (HCC)  1. Acute hypoxemic respiratory failure 1. O2 weaned to room air 2. Currently stable 2. Acute B PE 1. Currently continued on heparin gtt 2. Pt is s/p thrombolysis 4/6-4/7 3. Per Pulmonary, will require lifelong anticoagulation 4. Given morbid obesity, NOAC not recommended. Now on therapeutic coumadin with goal of INR 2-3. 5. INR remains stable at 2.1, heparin now off 6. Discharge today on coumadin 5mg  daily, recommend daily INR 3. Morbid obesity 1. Remains stable at this time 2. Continue to recommend diet/lifestyle modification  Discharge Instructions   Allergies as of 04/05/2019      Reactions   Adhesive [tape] Anaphylaxis, Swelling   *Adhesive Spray*       Medication List    TAKE these medications   amLODipine 5 MG tablet Commonly known as:  NORVASC Take 1 tablet (5 mg total) by mouth daily.   warfarin 5 MG tablet Commonly known as:  COUMADIN Take 1 tablet (5 mg total) by mouth one time only at 6 PM for 30 days.      Follow-up Information    [facility]. Go on 04/12/2019.   Why:  appointment is at 8:30am with Dr Randa Lynn information: [address] [place]  [address] [phone]         Allergies  Allergen Reactions  . Adhesive [Tape] Anaphylaxis and Swelling    *Adhesive Spray*     Consultations:  PCCM  Procedures/Studies: Ct Angio Chest Pe W/cm &/or Wo Cm  Result Date: 03/29/2019 CLINICAL DATA:  Shortness of breath. EXAM: CT ANGIOGRAPHY CHEST WITH CONTRAST TECHNIQUE: Multidetector CT imaging of the chest was performed using the standard protocol during bolus administration of intravenous contrast. Multiplanar CT image reconstructions and MIPs were obtained to evaluate the vascular anatomy. CONTRAST:  OMNIPAQUE IOHEXOL 350 MG/ML SOLN COMPARISON:  Chest x-ray dated 03/29/2019 and CT scan dated 02/05/2017 FINDINGS: Cardiovascular: There are massive bilateral pulmonary emboli. There is almost complete obstruction of the pulmonary arteries in the left lung with a tiny amount of flow into the left upper lobe. There is only minimal flow into the pulmonary arteries of the right lung due to the massive emboli. RV/LV ratio is 2.6 indicating marked right heart strain. Mediastinum/Nodes: No enlarged mediastinal, hilar, or axillary lymph nodes. Thyroid gland, trachea, and esophagus demonstrate no significant findings. Lungs/Pleura: Lungs are clear. No pleural effusion or pneumothorax. Upper Abdomen: Normal. Musculoskeletal: No chest wall abnormality. No acute or significant osseous findings. Review of the MIP images confirms the above findings. IMPRESSION: 1. Massive bilateral pulmonary emboli with minimal perfusion to both lungs. 2. Markedly abnormal RV/LV ratio of 2.6. Critical Value/emergent results were called by telephone at the time of interpretation on 03/29/2019 at 12:30 pm to [facility], PA-C , who verbally acknowledged these results. Electronically Signed   By: Fayrene Fearing  Maxwell M.D.   On: 03/29/2019 12:32   Ir US Guide Vasc Access Right  Result Date: 03/29/2019 INDICATION: Acute massive bilateral pulmonary emboli with significant right heart  strain. EXAM: 1. ULTRASOUND GUIDANCE FOR VENOUS ACCESS X2 2. BILATERAL PULMONARY ARTERIOGRAPHY 3. FLUOROSCOPIC GUIDED PLACEMENT OF BILATERAL PULMONARY ARTERIAL LYTIC INFUSION CATHETERS COMPARISON:  CTA 03/29/2019 MEDICATIONS: Intravenous Fentanyl and Versed 1mg  were administered as conscious sedation during continuous monitoring of the patient's level of consciousness and physiological / cardiorespiratory status by the radiology RN, with a total moderate sedation time of 20 minutes. CONTRAST:  None FLUOROSCOPY TIME:  4 minutes 30 seconds; 65 mGy COMPLICATIONS: None immediate TECHNIQUE: Informed written consent was obtained from the patient after a discussion of the risks, benefits and alternatives to treatment. Questions regarding the procedure were encouraged and answered. A timeout was performed prior to the initiation of the procedure. Ultrasound scanning was performed demonstrating patency of the right internal jugular vein which was selected for vascular access. The right neck was prepped and draped in the usual sterile fashion, and a sterile drape was applied covering the operative field. Maximum barrier sterile technique with sterile gowns and gloves were used for the procedure. A timeout was performed prior to the initiation of the procedure. Local anesthesia was provided with 1% lidocaine. Under direct ultrasound guidance, the right internal jugular vein was accessed with a micro puncture sheath ultimately allowing placement of a 6 French vascular sheath. Slightly cranial to this initial access, the right internal jugular vein was again accessed with a micropuncture sheath ultimately allowing placement of a 6 French vascular sheath. With the use of a Benson wire, a 5 French angled pigtail catheter was advanced into the right main pulmonary artery. Over an exchange length Rosen wire, the pigtail catheter was exchanged for a 105/18 cm multi side-hole EKOS ultrasound assisted infusion catheter. With the  use of a Bentson wire, via the second sheath a 5 French pigtail catheter was advanced into the left main pulmonary artery . Over an exchange length Rosen wire, the pigtail catheter was exchanged for a 105/12 cm multi side-hole EKOS ultrasound assisted infusion catheter. A postprocedural fluoroscopic image was obtained of the check demonstrating final catheter positioning. Following the procedure, both ultrasound assisted infusion catheter tips terminate within the distal aspects of the bilateral lower lobe sub segmental pulmonary arteries. Both vascular sheath were secured at the right neck with interrupted 0 Prolene sutures. The external catheter tubing was secured at the right chest and the lytic therapy was initiated. The patient tolerated the procedure well without immediate postprocedural complication. IMPRESSION: 1. Successful fluoroscopic guided initiation of bilateral ultrasound assisted catheter directed pulmonary arterial lysis for acute bilateral massive pulmonary embolism and right-sided heart strain. PLAN: -ICU observation during infusion -After at least 12 hours infusion, obtain pressure measurements and either removal of the catheters or continuation of the catheter directed thrombolysis. -consider lower extremity Doppler to exclude significant residual DVT Electronically Signed   By: Corlis Leak M.D.   On: 03/29/2019 16:28   Dg Chest Port 1 View  Result Date: 03/31/2019 CLINICAL DATA:  Shortness of Breath EXAM: PORTABLE CHEST 1 VIEW COMPARISON:  03/29/2019 FINDINGS: Cardiac shadow is stable. The lungs are well aerated bilaterally. No focal infiltrate or sizable effusion is seen. No acute bony abnormality is noted. IMPRESSION: No active disease. Electronically Signed   By: Alcide Clever M.D.   On: 03/31/2019 08:22   Dg Chest Portable 1 View  Result Date: 03/29/2019 CLINICAL DATA:  Onset  shortness of breath and tachycardia 3 hours ago. EXAM: PORTABLE CHEST 1 VIEW COMPARISON:  CT chest and PA and  lateral chest 02/05/2017. FINDINGS: The lungs are clear. Heart size is normal. No pneumothorax or pleural fluid. No acute or focal bony abnormality. IMPRESSION: Negative chest. Electronically Signed   By: Drusilla Kanner M.D.   On: 03/29/2019 10:32   Vas [place] Lower Extremity Venous (dvt)  Result Date: 03/30/2019  Lower Venous Study Indications: Pulmonary embolism.  Limitations: Body habitus and poor ultrasound/tissue interface. Performing Technologist: Chanda Busing RVT  Examination Guidelines: A complete evaluation includes B-mode imaging, spectral Doppler, color Doppler, and power Doppler as needed of all accessible portions of each vessel. Bilateral testing is considered an integral part of a complete examination. Limited examinations for reoccurring indications may be performed as noted.  Right Venous Findings: +---------+---------------+---------+-----------+----------+--------------+          CompressibilityPhasicitySpontaneityPropertiesSummary        +---------+---------------+---------+-----------+----------+--------------+ CFV      Full           No       No                                  +---------+---------------+---------+-----------+----------+--------------+ SFJ      Full                                                        +---------+---------------+---------+-----------+----------+--------------+ FV Prox  Full                                                        +---------+---------------+---------+-----------+----------+--------------+ FV Mid                                                Not visualized +---------+---------------+---------+-----------+----------+--------------+ FV Distal                                             Not visualized +---------+---------------+---------+-----------+----------+--------------+ PFV                                                   Not visualized  +---------+---------------+---------+-----------+----------+--------------+ POP                                                   Not visualized +---------+---------------+---------+-----------+----------+--------------+ PTV  Not visualized +---------+---------------+---------+-----------+----------+--------------+ PERO                                                  Not visualized +---------+---------------+---------+-----------+----------+--------------+  Left Venous Findings: +---------+---------------+---------+-----------+----------+--------------+          CompressibilityPhasicitySpontaneityPropertiesSummary        +---------+---------------+---------+-----------+----------+--------------+ CFV      Full           Yes      Yes                                 +---------+---------------+---------+-----------+----------+--------------+ SFJ      Full                                                        +---------+---------------+---------+-----------+----------+--------------+ FV Prox  Full                                                        +---------+---------------+---------+-----------+----------+--------------+ FV Mid   Full                                                        +---------+---------------+---------+-----------+----------+--------------+ FV Distal                                             Not visualized +---------+---------------+---------+-----------+----------+--------------+ PFV                                                   Not visualized +---------+---------------+---------+-----------+----------+--------------+ POP      Full           Yes      No                                  +---------+---------------+---------+-----------+----------+--------------+ PTV                                                   Not visualized  +---------+---------------+---------+-----------+----------+--------------+ PERO                                                  Not visualized +---------+---------------+---------+-----------+----------+--------------+    Summary: Right: There is no evidence of deep  vein thrombosis in the lower extremity. However, portions of this examination were limited- see technologist comments above. No cystic structure found in the popliteal fossa. Left: There is no evidence of deep vein thrombosis in the lower extremity. However, portions of this examination were limited- see technologist comments above. No cystic structure found in the popliteal fossa.  *See table(s) above for measurements and observations. Electronically signed by Waverly Ferrari MD on 03/30/2019 at 5:18:24 PM.    Final    Ir Infusion Thrombol Arterial Initial (ms)  Result Date: 03/29/2019 INDICATION: Acute massive bilateral pulmonary emboli with significant right heart strain. EXAM: 1. ULTRASOUND GUIDANCE FOR VENOUS ACCESS X2 2. BILATERAL PULMONARY ARTERIOGRAPHY 3. FLUOROSCOPIC GUIDED PLACEMENT OF BILATERAL PULMONARY ARTERIAL LYTIC INFUSION CATHETERS COMPARISON:  CTA 03/29/2019 MEDICATIONS: Intravenous Fentanyl and Versed  were administered as conscious sedation during continuous monitoring of the patient's level of consciousness and physiological / cardiorespiratory status by the radiology RN, with a total moderate sedation time of 20 minutes. CONTRAST:  None FLUOROSCOPY TIME:  4 minutes 30 seconds; 65 mGy COMPLICATIONS: None immediate TECHNIQUE: Informed written consent was obtained from the patient after a discussion of the risks, benefits and alternatives to treatment. Questions regarding the procedure were encouraged and answered. A timeout was performed prior to the initiation of the procedure. Ultrasound scanning was performed demonstrating patency of the right internal jugular vein which was selected for vascular access. The  right neck was prepped and draped in the usual sterile fashion, and a sterile drape was applied covering the operative field. Maximum barrier sterile technique with sterile gowns and gloves were used for the procedure. A timeout was performed prior to the initiation of the procedure. Local anesthesia was provided with 1% lidocaine. Under direct ultrasound guidance, the right internal jugular vein was accessed with a micro puncture sheath ultimately allowing placement of a 6 French vascular sheath. Slightly cranial to this initial access, the right internal jugular vein was again accessed with a micropuncture sheath ultimately allowing placement of a 6 French vascular sheath. With the use of a Benson wire, a 5 French angled pigtail catheter was advanced into the right main pulmonary artery. Over an exchange length Rosen wire, the pigtail catheter was exchanged for a 105/18 cm multi side-hole EKOS ultrasound assisted infusion catheter. With the use of a Bentson wire, via the second sheath a 5 French pigtail catheter was advanced into the left main pulmonary artery . Over an exchange length Rosen wire, the pigtail catheter was exchanged for a 105/12 cm multi side-hole EKOS ultrasound assisted infusion catheter. A postprocedural fluoroscopic image was obtained of the check demonstrating final catheter positioning. Following the procedure, both ultrasound assisted infusion catheter tips terminate within the distal aspects of the bilateral lower lobe sub segmental pulmonary arteries. Both vascular sheath were secured at the right neck with interrupted 0 Prolene sutures. The external catheter tubing was secured at the right chest and the lytic therapy was initiated. The patient tolerated the procedure well without immediate postprocedural complication. IMPRESSION: 1. Successful fluoroscopic guided initiation of bilateral ultrasound assisted catheter directed pulmonary arterial lysis for acute bilateral massive pulmonary  embolism and right-sided heart strain. PLAN: -ICU observation during infusion -After at least 12 hours infusion, obtain pressure measurements and either removal of the catheters or continuation of the catheter directed thrombolysis. -consider lower extremity Doppler to exclude significant residual DVT Electronically Signed   By: Corlis Leak M.D.   On: 03/29/2019 16:28   Ir Infusion Thrombol Arterial Initial (ms)  Result Date: 03/29/2019 INDICATION: Acute massive bilateral pulmonary emboli with significant right heart strain. EXAM: 1. ULTRASOUND GUIDANCE FOR VENOUS ACCESS X2 2. BILATERAL PULMONARY ARTERIOGRAPHY 3. FLUOROSCOPIC GUIDED PLACEMENT OF BILATERAL PULMONARY ARTERIAL LYTIC INFUSION CATHETERS COMPARISON:  CTA 03/29/2019 MEDICATIONS: Intravenous Fentanyl and Versed  were administered as conscious sedation during continuous monitoring of the patient's level of consciousness and physiological / cardiorespiratory status by the radiology RN, with a total moderate sedation time of 20 minutes. CONTRAST:  None FLUOROSCOPY TIME:  4 minutes 30 seconds; 65 mGy COMPLICATIONS: None immediate TECHNIQUE: Informed written consent was obtained from the patient after a discussion of the risks, benefits and alternatives to treatment. Questions regarding the procedure were encouraged and answered. A timeout was performed prior to the initiation of the procedure. Ultrasound scanning was performed demonstrating patency of the right internal jugular vein which was selected for vascular access. The right neck was prepped and draped in the usual sterile fashion, and a sterile drape was applied covering the operative field. Maximum barrier sterile technique with sterile gowns and gloves were used for the procedure. A timeout was performed prior to the initiation of the procedure. Local anesthesia was provided with 1% lidocaine. Under direct ultrasound guidance, the right internal jugular vein was accessed with a micro  puncture sheath ultimately allowing placement of a 6 French vascular sheath. Slightly cranial to this initial access, the right internal jugular vein was again accessed with a micropuncture sheath ultimately allowing placement of a 6 French vascular sheath. With the use of a Benson wire, a 5 French angled pigtail catheter was advanced into the right main pulmonary artery. Over an exchange length Rosen wire, the pigtail catheter was exchanged for a 105/18 cm multi side-hole EKOS ultrasound assisted infusion catheter. With the use of a Bentson wire, via the second sheath a 5 French pigtail catheter was advanced into the left main pulmonary artery . Over an exchange length Rosen wire, the pigtail catheter was exchanged for a 105/12 cm multi side-hole EKOS ultrasound assisted infusion catheter. A postprocedural fluoroscopic image was obtained of the check demonstrating final catheter positioning. Following the procedure, both ultrasound assisted infusion catheter tips terminate within the distal aspects of the bilateral lower lobe sub segmental pulmonary arteries. Both vascular sheath were secured at the right neck with interrupted 0 Prolene sutures. The external catheter tubing was secured at the right chest and the lytic therapy was initiated. The patient tolerated the procedure well without immediate postprocedural complication. IMPRESSION: 1. Successful fluoroscopic guided initiation of bilateral ultrasound assisted catheter directed pulmonary arterial lysis for acute bilateral massive pulmonary embolism and right-sided heart strain. PLAN: -ICU observation during infusion -After at least 12 hours infusion, obtain pressure measurements and either removal of the catheters or continuation of the catheter directed thrombolysis. -consider lower extremity Doppler to exclude significant residual DVT Electronically Signed   By: Corlis Leak M.D.   On: 03/29/2019 16:28   Ir Rande Lawman F/u Eval Art/ven Final Day (ms)  Result  Date: 03/30/2019 INDICATION: History of acute bilateral massive pulmonary embolism, post initiation of bilateral pulmonary arterial thrombolysis on 03/29/2019. Patient has completed 12 hours of bilateral pulmonary arterial lytic infusion and is seen in the ICU for repeat pulmonary arterial pressure measurements. EXAM: PULMONARY ARTERIAL PRESSURE MEASUREMENT AND PULMONARY INFUSION CATHETER REMOVAL COMPARISON:  Initiation of bilateral pulmonary arterial catheter directed thrombolysis-03/29/2019; chest CTA 03/29/2019 MEDICATIONS: None CONTRAST:  None required FLUOROSCOPY TIME:  None utilized COMPLICATIONS: None immediate. TECHNIQUE: The patient was positioned supine . Pulmonary  arterial pressure min measurements via indwelling pulmonary arterial catheters, 51/27 (37) mmHg. At this point, the procedure was terminated. All wires, catheters and sheaths were removed from the patient. Hemostasis was achieved at the right IJ access sites with manual compression. A dressing was placed. The patient tolerated the procedure well without immediate postprocedural complication. IMPRESSION: Technically successful bilateral catheter directed pulmonary arterial thrombolysis with mild-moderate residual pulmonary hypertension. Electronically Signed   By: Corlis Leak M.D.   On: 03/30/2019 12:46     Subjective: Eager to go home  Discharge Exam: Vitals:   04/05/19 0622 04/05/19 0746  BP: (!) 143/69 110/65  Pulse: 77   Resp: 20 10  Temp: 98.4 F (36.9 C)   SpO2: 100% 100%   Vitals:   04/04/19 2052 04/05/19 0622 04/05/19 0634 04/05/19 0746  BP: 129/67 (!) 143/69  110/65  Pulse: 87 77    Resp: 19 20  10   Temp: 98.5 F (36.9 C) 98.4 F (36.9 C)    TempSrc: Oral Oral    SpO2: 96% 100%  100%  Weight:   (!) 221.1 kg   Height:        General: Pt is alert, awake, not in acute distress Cardiovascular: RRR, S1/S2 +, no rubs, no gallops Respiratory: CTA bilaterally, no wheezing, no rhonchi Abdominal: Soft, NT, ND, bowel  sounds + Extremities: no edema, no cyanosis   The results of significant diagnostics from this hospitalization (including imaging, microbiology, ancillary and laboratory) are listed below for reference.     Microbiology: Recent Results (from the past 240 hour(s))  MRSA PCR Screening     Status: None   Collection Time: 03/29/19  5:56 PM  Result Value Ref Range Status   MRSA by PCR NEGATIVE NEGATIVE Final    Comment:        The GeneXpert MRSA Assay (FDA approved for NASAL specimens only), is one component of a comprehensive MRSA colonization surveillance program. It is not intended to diagnose MRSA infection nor to guide or monitor treatment for MRSA infections. Performed at [facility], [address] [place], [place] [address]      Labs: BNP (last 3 results) No results for input(s): BNP in the last 8760 hours. Basic Metabolic Panel: Recent Labs  Lab 03/30/19 0420 03/31/19 0236  NA 136 133*  K 3.4* 3.7  CL 105 107  CO2 22 20*  GLUCOSE 120* 115*  BUN 16 14  CREATININE 0.82 0.76  CALCIUM 8.5* 8.3*  MG  --  1.7   Liver Function Tests: Recent Labs  Lab 03/31/19 0236  AST 36  ALT 29  ALKPHOS 69  BILITOT 1.0  PROT 6.6  ALBUMIN 2.9*   No results for input(s): LIPASE, AMYLASE in the last 168 hours. No results for input(s): AMMONIA in the last 168 hours. CBC: Recent Labs  Lab 04/01/19 0430 04/02/19 0510 04/03/19 0244 04/04/19 0516 04/05/19 0439  WBC 11.8* 11.0* 10.8* 10.0 9.9  HGB 11.5* 11.5* 11.7* 12.1 12.4  HCT 35.0* 35.3* 36.1 37.7 38.4  MCV 88.8 89.6 88.9 89.1 87.9  PLT 179 179 182 188 182   Cardiac Enzymes: No results for input(s): CKTOTAL, CKMB, CKMBINDEX, TROPONINI in the last 168 hours. BNP: Invalid input(s): POCBNP CBG: Recent Labs  Lab 03/29/19 1702 04/03/19 0528  GLUCAP 165* 87   D-Dimer No results for input(s): DDIMER in the last 72 hours. Hgb A1c No results for input(s): HGBA1C in the last 72 hours. Lipid Profile No  results for input(s): CHOL,  HDL, LDLCALC, TRIG, CHOLHDL, LDLDIRECT in the last 72 hours. Thyroid function studies No results for input(s): TSH, T4TOTAL, T3FREE, THYROIDAB in the last 72 hours.  Invalid input(s): FREET3 Anemia work up No results for input(s): VITAMINB12, FOLATE, FERRITIN, TIBC, IRON, RETICCTPCT in the last 72 hours. Urinalysis    Component Value Date/Time   COLORURINE YELLOW 05/08/2017 1214   APPEARANCEUR CLEAR 05/08/2017 1214   LABSPEC 1.014 05/08/2017 1214   PHURINE 6.0 05/08/2017 1214   GLUCOSEU NEGATIVE 05/08/2017 1214   GLUCOSEU NEGATIVE 01/25/2014 1049   HGBUR LARGE (A) 05/08/2017 1214   BILIRUBINUR NEGATIVE 05/08/2017 1214   KETONESUR NEGATIVE 05/08/2017 1214   PROTEINUR NEGATIVE 05/08/2017 1214   UROBILINOGEN 0.2 01/25/2014 1049   NITRITE NEGATIVE 05/08/2017 1214   LEUKOCYTESUR MODERATE (A) 05/08/2017 1214   Sepsis Labs Invalid input(s): PROCALCITONIN,  WBC,  LACTICIDVEN Microbiology Recent Results (from the past 240 hour(s))  MRSA PCR Screening     Status: None   Collection Time: 03/29/19  5:56 PM  Result Value Ref Range Status   MRSA by PCR NEGATIVE NEGATIVE Final    Comment:        The GeneXpert MRSA Assay (FDA approved for NASAL specimens only), is one component of a comprehensive MRSA colonization surveillance program. It is not intended to diagnose MRSA infection nor to guide or monitor treatment for MRSA infections. Performed at [facility], [address] [place], [place] [address]    Time spent: 30 min  SIGNED:   Rickey BarbaraStephen Chiu, MD  [facility] 04/05/2019, 1:21 PM  If 7PM-7AM, please contact night-coverage

## 2019-04-05 NOTE — Progress Notes (Signed)
ANTICOAGULATION CONSULT NOTE - Follow Up Consult  Pharmacy Consult for heparin/coumadin  Indication: pulmonary embolus w/ EKOS  Patient Measurements: Height: 5\' 4"  (162.6 cm) Weight: (!) 450 lb (204.1 kg)(pt refused to stand bed needs to be zeroed) IBW/kg (Calculated) : 54.7 Heparin Dosing Weight:109.4 kg    Labs: Recent Labs    04/03/19 0244 04/04/19 0516 04/04/19 1547 04/05/19 0439  HGB 11.7* 12.1  --  12.4  HCT 36.1 37.7  --  38.4  PLT 182 188  --  182  LABPROT 16.6* 24.4* 24.4* 23.6*  INR 1.4* 2.2* 2.2* 2.1*  HEPARINUNFRC 0.31 0.29* 0.48 0.54    Assessment: 43yo female on heparin for massive acute bilateral PE s/p EKOS on 4/7. Pharmacy consulted dose warfarin (day 5 of overlap; start day 4/9). INR went from 1.4>> 2.2 from 4/11>>4/12. -INR= 2.1, heparin level = 0.54   Goal of Therapy:  Heparin level 0.3-0.7 units/ml  INR goal 2-3   Plan:  -Warfarin 5mg  po today -When ready for discharge would consider warfarin 5mg /day with INR check early this week -Consider d/c heparin   Harland German, PharmD Clinical Pharmacist **Pharmacist phone directory can now be found on [email or web address] ([email or web address]).  Listed under [facility].

## 2019-04-11 ENCOUNTER — Observation Stay (HOSPITAL_COMMUNITY)
Admission: EM | Admit: 2019-04-11 | Discharge: 2019-04-14 | Disposition: A | Discharge status: Home / Self Care | Payer: 59 | Attending: Internal Medicine | Admitting: Internal Medicine

## 2019-04-11 ENCOUNTER — Other Ambulatory Visit: Payer: Self-pay

## 2019-04-11 ENCOUNTER — Encounter (HOSPITAL_COMMUNITY): Payer: Self-pay | Admitting: *Deleted

## 2019-04-11 DIAGNOSIS — R911 Solitary pulmonary nodule: Secondary | ICD-10-CM | POA: Insufficient documentation

## 2019-04-11 DIAGNOSIS — Z8249 Family history of ischemic heart disease and other diseases of the circulatory system: Secondary | ICD-10-CM | POA: Diagnosis not present

## 2019-04-11 DIAGNOSIS — G4730 Sleep apnea, unspecified: Secondary | ICD-10-CM | POA: Diagnosis not present

## 2019-04-11 DIAGNOSIS — K7689 Other specified diseases of liver: Secondary | ICD-10-CM | POA: Diagnosis present

## 2019-04-11 DIAGNOSIS — Z6841 Body Mass Index (BMI) 40.0 and over, adult: Secondary | ICD-10-CM | POA: Diagnosis not present

## 2019-04-11 DIAGNOSIS — Z79899 Other long term (current) drug therapy: Secondary | ICD-10-CM | POA: Diagnosis not present

## 2019-04-11 DIAGNOSIS — I2699 Other pulmonary embolism without acute cor pulmonale: Secondary | ICD-10-CM | POA: Diagnosis present

## 2019-04-11 DIAGNOSIS — F419 Anxiety disorder, unspecified: Secondary | ICD-10-CM | POA: Insufficient documentation

## 2019-04-11 DIAGNOSIS — I10 Essential (primary) hypertension: Secondary | ICD-10-CM | POA: Diagnosis not present

## 2019-04-11 DIAGNOSIS — Z7901 Long term (current) use of anticoagulants: Secondary | ICD-10-CM | POA: Diagnosis not present

## 2019-04-11 DIAGNOSIS — M7989 Other specified soft tissue disorders: Principal | ICD-10-CM | POA: Diagnosis present

## 2019-04-11 DIAGNOSIS — I82401 Acute embolism and thrombosis of unspecified deep veins of right lower extremity: Secondary | ICD-10-CM | POA: Diagnosis present

## 2019-04-11 DIAGNOSIS — I700 Atherosclerosis of aorta: Secondary | ICD-10-CM | POA: Diagnosis not present

## 2019-04-11 DIAGNOSIS — I82501 Chronic embolism and thrombosis of unspecified deep veins of right lower extremity: Secondary | ICD-10-CM | POA: Diagnosis present

## 2019-04-11 DIAGNOSIS — Z86711 Personal history of pulmonary embolism: Secondary | ICD-10-CM | POA: Insufficient documentation

## 2019-04-11 DIAGNOSIS — G43909 Migraine, unspecified, not intractable, without status migrainosus: Secondary | ICD-10-CM | POA: Insufficient documentation

## 2019-04-11 DIAGNOSIS — F329 Major depressive disorder, single episode, unspecified: Secondary | ICD-10-CM | POA: Insufficient documentation

## 2019-04-11 DIAGNOSIS — Z975 Presence of (intrauterine) contraceptive device: Secondary | ICD-10-CM | POA: Insufficient documentation

## 2019-04-11 DIAGNOSIS — K219 Gastro-esophageal reflux disease without esophagitis: Secondary | ICD-10-CM | POA: Insufficient documentation

## 2019-04-11 DIAGNOSIS — I890 Lymphedema, not elsewhere classified: Secondary | ICD-10-CM | POA: Diagnosis not present

## 2019-04-11 DIAGNOSIS — E871 Hypo-osmolality and hyponatremia: Secondary | ICD-10-CM | POA: Insufficient documentation

## 2019-04-11 HISTORY — DX: Other specified diseases of liver: K76.89

## 2019-04-11 HISTORY — DX: Lymphedema, not elsewhere classified: I89.0

## 2019-04-11 HISTORY — DX: Acute embolism and thrombosis of unspecified deep veins of unspecified lower extremity: I82.409

## 2019-04-11 HISTORY — DX: Morbid (severe) obesity due to excess calories: E66.01

## 2019-04-11 HISTORY — DX: Body Mass Index (BMI) 40.0 and over, adult: Z684

## 2019-04-11 LAB — BASIC METABOLIC PANEL
Anion gap: 5 (ref 5–15)
BUN: 10 mg/dL (ref 6–20)
CO2: 25 mmol/L (ref 22–32)
Calcium: 8.7 mg/dL — ABNORMAL LOW (ref 8.9–10.3)
Chloride: 103 mmol/L (ref 98–111)
Creatinine, Ser: 0.78 mg/dL (ref 0.44–1.00)
GFR calc Af Amer: >60 mL/min (ref >60)
GFR calc non Af Amer: >60 mL/min (ref >60)
Glucose, Bld: 138 mg/dL — ABNORMAL HIGH (ref 70–99)
Potassium: 4.2 mmol/L (ref 3.5–5.1)
Sodium: 133 mmol/L — ABNORMAL LOW (ref 135–145)

## 2019-04-11 LAB — CBC WITH DIFFERENTIAL/PLATELET
Abs Immature Granulocytes: 0.05 10*3/uL (ref 0.00–0.07)
Basophils Absolute: 0 10*3/uL (ref 0.0–0.1)
Basophils Relative: 0 %
Eosinophils Absolute: 0.4 10*3/uL (ref 0.0–0.5)
Eosinophils Relative: 4 %
HCT: 36.6 % (ref 36.0–46.0)
Hemoglobin: 11.7 g/dL — ABNORMAL LOW (ref 12.0–15.0)
Immature Granulocytes: 1 %
Lymphocytes Relative: 15 %
Lymphs Abs: 1.6 10*3/uL (ref 0.7–4.0)
MCH: 29.3 pg (ref 26.0–34.0)
MCHC: 32 g/dL (ref 30.0–36.0)
MCV: 91.5 fL (ref 80.0–100.0)
Monocytes Absolute: 0.7 10*3/uL (ref 0.1–1.0)
Monocytes Relative: 7 %
Neutro Abs: 7.9 10*3/uL — ABNORMAL HIGH (ref 1.7–7.7)
Neutrophils Relative %: 73 %
Platelets: 309 10*3/uL (ref 150–400)
RBC: 4 MIL/uL (ref 3.87–5.11)
RDW: 14.4 % (ref 11.5–15.5)
WBC: 10.6 10*3/uL — ABNORMAL HIGH (ref 4.0–10.5)
nRBC: 0 % (ref 0.0–0.2)

## 2019-04-11 LAB — PROTIME-INR
INR: 2.9 — ABNORMAL HIGH (ref 0.8–1.2)
Prothrombin Time: 29.6 seconds — ABNORMAL HIGH (ref 11.4–15.2)

## 2019-04-11 NOTE — ED Triage Notes (Signed)
The pt arrived by gems from home she has had pain and swelling for one week since she was in the hospital  For bi-lateral pes  On coumadin  More pain today  lmp none

## 2019-04-11 NOTE — ED Provider Notes (Addendum)
[facility] Provider Note   CSN: [id number] Arrival date & time: 04/11/19  2206    History   Chief Complaint Chief Complaint  Patient presents with  . Leg Swelling    HPI Susan Warren is a 43 y.o. female.     HPI  Patient is a 43 year old female the past medical history of massive pulmonary embolism (hospitalized 4/6-4/15 and treated with IR guided thrombolysis) now on warfarin with a goal INR between 2 and 3 presenting for right leg pain and swelling.  Patient reports that she began having pain during her hospitalization, however she had a venous duplex ultrasound which did not identify any large clot load in the legs, however multiple veins were unable to be assessed.  Patient reports that she feels an ache in her throb primarily in the upper thigh.  She reports that the leg has been progressively increasing in size ever since she was discharged.  She denies any fever or chills.  Patient denies any loss of sensation, or distal pallor in her right lower extremity.  Patient reports that she has been able to ambulate.  Patient denies any shortness of breath or cough.  Patient is not diabetic.  Past Medical History:  Diagnosis Date  . Allergy   . Anxiety   . Depression   . GERD (gastroesophageal reflux disease)   . Hypertension   . Migraines   . PE (pulmonary thromboembolism) (HCC) 03/2019  . Sleep apnea     Patient Active Problem List   Diagnosis Date Noted  . Acute massive pulmonary embolism (HCC) 03/29/2019  . Angioedema 04/07/2015  . Preventative health care 01/25/2014  . Hypertension   . Depression   . GERD (gastroesophageal reflux disease)   . Allergy   . Migraines   . Anxiety   . IUD (intrauterine device) in place 03/10/2013    Past Surgical History:  Procedure Laterality Date  . CHOLECYSTECTOMY    . IR INFUSION THROMBOL ARTERIAL INITIAL (MS)  03/29/2019  . IR INFUSION THROMBOL ARTERIAL INITIAL (MS)  03/29/2019  . IR  THROMB F/U EVAL ART/VEN FINAL DAY (MS)  03/30/2019  . IR US GUIDE VASC ACCESS RIGHT  03/29/2019     OB History    Gravida  1   Para  1   Term  1   Preterm  0   AB  0   Living  1     SAB  0   TAB  0   Ectopic  0   Multiple  0   Live Births               Home Medications    Prior to Admission medications   Medication Sig Start Date End Date Taking? Authorizing Provider  amLODipine (NORVASC) 5 MG tablet Take 1 tablet (5 mg total) by mouth daily. 02/21/19   Harlene SaltsMorelli, Brandon A, PA-C  warfarin (COUMADIN) 5 MG tablet Take 1 tablet (5 mg total) by mouth one time only at 6 PM for 30 days. 04/05/19 05/05/19  Jerald Kiefhiu, Stephen K, MD    Family History Family History  Problem Relation Age of Onset  . Hypertension Mother   . Thyroid disease Mother   . Hypertension Maternal Grandmother   . Diabetes Maternal Grandmother   . Breast cancer Maternal Grandmother   . Stroke Maternal Grandmother   . Heart attack Neg Hx     Social History Social History   Tobacco Use  . Smoking status: Never  Smoker  . Smokeless tobacco: Never Used  Substance Use Topics  . Alcohol use: Yes    Comment: occ  . Drug use: No     Allergies   Adhesive [tape]   Review of Systems Review of Systems  Constitutional: Negative for chills and fever.  Respiratory: Negative for cough and shortness of breath.   Gastrointestinal: Negative for abdominal pain, nausea and vomiting.  Musculoskeletal: Positive for myalgias.  Skin: Positive for color change. Negative for pallor and wound.  Neurological: Negative for weakness and numbness.  All other systems reviewed and are negative.    Physical Exam Updated Vital Signs BP 122/82   Pulse 98   Temp (P) 98.2 F (36.8 C)   Resp (P) 20   Ht  (1.626 m)   Wt (!) 208.7 kg   SpO2 100%   BMI 78.96 kg/m   Physical Exam Vitals signs and nursing note reviewed.  Constitutional:      General: She is not in acute distress.    Appearance: She is  well-developed. She is obese. She is not ill-appearing or diaphoretic.  HENT:     Head: Normocephalic and atraumatic.     Mouth/Throat:     Mouth: Mucous membranes are moist.  Eyes:     Conjunctiva/sclera: Conjunctivae normal.     Pupils: Pupils are equal, round, and reactive to light.  Neck:     Musculoskeletal: Normal range of motion and neck supple.  Cardiovascular:     Rate and Rhythm: Normal rate and regular rhythm.     Heart sounds: S1 normal and S2 normal. No murmur.  Pulmonary:     Effort: Pulmonary effort is normal.     Breath sounds: Normal breath sounds. No wheezing or rales.  Abdominal:     General: There is no distension.     Palpations: Abdomen is soft.     Tenderness: There is no abdominal tenderness. There is no guarding.  Musculoskeletal: Normal range of motion.     Right lower leg: Edema present.     Left lower leg: Edema present.     Comments: Patient has massive swelling bilateral lower extremities.  Right is greater than left.  Patient's circumference of the right leg is approximately 1.5 times the circumference of the left leg.  Patient has some induration and erythema particularly the medial aspect of the right leg from the higher side down to the level of the mid calf. Patient has intact, 2+ right lower extremity pulse identified on Doppler.  Capillary refill is less than 2 seconds bilaterally.  Patient has full sensation distally in bilateral LEs.   Lymphadenopathy:     Cervical: No cervical adenopathy.  Skin:    General: Skin is warm and dry.     Findings: Erythema present. No rash.  Neurological:     Mental Status: She is alert.     Comments: Cranial nerves grossly intact. Patient moves extremities symmetrically and with good coordination.  Psychiatric:        Behavior: Behavior normal.        Thought Content: Thought content normal.        Judgment: Judgment normal.      ED Treatments / Results  Labs (all labs ordered are listed, but only  abnormal results are displayed) Labs Reviewed  BASIC METABOLIC PANEL - Abnormal; Notable for the following components:      Result Value   Sodium 133 (*)    Glucose, Bld 138 (*)  Calcium 8.7 (*)    All other components within normal limits  CBC WITH DIFFERENTIAL/PLATELET - Abnormal; Notable for the following components:   WBC 10.6 (*)    Hemoglobin 11.7 (*)    Neutro Abs 7.9 (*)    All other components within normal limits  PROTIME-INR - Abnormal; Notable for the following components:   Prothrombin Time 29.6 (*)    INR 2.9 (*)    All other components within normal limits  CULTURE, BLOOD (ROUTINE X 2)  CULTURE, BLOOD (ROUTINE X 2)  HCG, QUANTITATIVE, PREGNANCY  LACTIC ACID, PLASMA  LACTIC ACID, PLASMA    EKG None  Radiology Ct Venogram Abd/pel  Result Date: 04/12/2019 CLINICAL DATA:  Right lower extremity swelling. EXAM: CT ABDOMEN AND PELVIS WITH CONTRAST (CT VENOGRAM) TECHNIQUE: Multidetector CT imaging of the abdomen and pelvis was performed using the standard protocol following bolus administration of intravenous contrast. CONTRAST:  OMNIPAQUE IOHEXOL 300 MG/ML  SOLN COMPARISON:  05/08/2017 FINDINGS: Lower chest: 11 mm nodule partially imaged on the 1st image in the right lower lobe. Heart is normal size. No effusions. Hepatobiliary: Prior cholecystectomy. Again noted is a large exophytic cyst arising from the inferior liver, extending into the upper pelvis, measuring 19 x 17 cm compared with 18.5 x 16.3 cm previously. Pancreas: No focal abnormality or ductal dilatation. Spleen: No focal abnormality.  Normal size. Adrenals/Urinary Tract: No adrenal abnormality. No focal renal abnormality. No stones or hydronephrosis. Urinary bladder is unremarkable. Stomach/Bowel: Stomach, large and small bowel grossly unremarkable. Vascular/Lymphatic: Scattered aortic atherosclerosis. Aorta is normal caliber. The IVC is compressed by the large exophytic attic cyst. Infrarenal IVC is  slit-like. The iliofemoral vessels appear patent although not well opacified and difficult to evaluate. The deep venous structures of the right lower extremity appear distended and indistinct, including the femoral vein, profundus femoris and popliteal vein. Findings concerning for deep venous thrombosis. Reproductive: Uterus and adnexa unremarkable. No mass. IUD in place. Other: No free fluid or free air. Musculoskeletal: No acute bony abnormality. Marked edema and swelling involving the visualized right lower extremity. IMPRESSION: Findings concerning for DVT within the right femoral vein, profundus femoris and possibly popliteal vein. These vessels appear distended and indistinct. Recommend confirmation with right lower extremity venous ultrasound. Large exophytic cyst arising from the inferior liver extending into the upper pelvis measuring up to 19 cm. This is slightly larger when compared to 2018 study when this measured up to 18.4 cm. This causes mass effect and compression of the IVC. Prior cholecystectomy. Electronically Signed   By: Charlett Nose M.D.   On: 04/12/2019 03:06    Procedures Procedures (including critical care time)  Medications Ordered in ED Medications  ceFAZolin (ANCEF) IVPB 2g/100 mL premix (2 g Intravenous New Bag/Given 04/12/19 0449)  iohexol (OMNIPAQUE) 300 MG/ML solution 125 mL (125 mLs Intravenous Contrast Given 04/12/19 0233)  HYDROcodone-acetaminophen (NORCO/VICODIN) 5-325 MG per tablet 2 tablet (2 tablets Oral Given 04/12/19 0444)     Initial Impression / Assessment and Plan / ED Course  I have reviewed the triage vital signs and the nursing notes.  Pertinent labs & imaging results that were available during my care of the patient were reviewed by me and considered in my medical decision making (see chart for details).  Clinical Course as of Apr 11 509  Mon Apr 12, 2019  0016 Therapeutic.  INR(!): 2.9 [AM]  0300 Reassessed. Appears well. Blood pressure improved  after a couple of soft blood pressure readings.   [AM]  2130 Spoke with Dr. Burt Knack of vascular surgery who states that given patient's habitus, there is likely nothing interventional to do.  He does recommend continuing anticoagulation the patient can be seen by them in the office.  If she is admitted, he will see tomorrow.  Appreciate his involvement.   [AM]  [phone] Case discussed with radiology who felt that patient's edema and stranding of the right lower extremity could be cellulitis versus DVT.  Indistinguishable on CT.  Appreciate their involvement.   [AM]  [phone] Spoke with Dr. Antionette Char who will admit patient.  I appreciate his involvement.   [AM]    Clinical Course User Index [AM] Elisha Ponder, PA-C        Patient is nontoxic-appearing, afebrile, hemodynamically stable.  Differential diagnosis includes phlegmasia cerulea dolens, cellulitis, large clot or DVT not identified on previous ultrasound.  I reviewed the patient's ultrasound report performed during her most recent admission which demonstrated nonvisualized mid/distal femoral vein, proximal femoral vein.  Case discussed with radiology who recommends CT venogram of abdomen and pelvis with instructions to radiology technician to extend down the right leg.  This is to look for any proximal clot load such as iliofemoral that could be causing massive swelling.  Work-up demonstrating therapeutic INR 2.9.  WBC count essentially normal.  Slight left shift.  Patient's CT venogram is demonstrating findings concerning for distended veins of the right femoral, profundus, and possibly popliteal.  Notes that iliofemoral appears patent however contrast load is insufficient.  Discussed personally with radiologist who states that there is possible superimposed cellulitis, however these are indistinct processes on the CT imaging.    At this time, given the amount of swelling, somewhat equivocal laboratory findings, and the enlarging cyst in the abdomen  that is compressing on IVC, will discuss admission with hospital medicine.  Patient reports that the pain is severe and interfering with quality of life.  Vascular surgery states that they will see the patient if she is admitted in the morning.  This is a shared visit with Dr. Blane Ohara. Patient was independently evaluated by this attending physician. Attending physician consulted in evaluation and management.  Final Clinical Impressions(s) / ED Diagnoses   Final diagnoses:  Right leg swelling    ED Discharge Orders    None       Delia Chimes 04/12/19 0452    Elisha Ponder, PA-C 04/12/19 0510    Blane Ohara, MD 04/13/19 [phone]

## 2019-04-12 ENCOUNTER — Inpatient Hospital Stay: Payer: 59 | Admitting: Critical Care Medicine

## 2019-04-12 ENCOUNTER — Emergency Department (HOSPITAL_COMMUNITY): Payer: 59

## 2019-04-12 ENCOUNTER — Encounter (HOSPITAL_COMMUNITY): Payer: Self-pay | Admitting: Family Medicine

## 2019-04-12 ENCOUNTER — Other Ambulatory Visit: Payer: Self-pay

## 2019-04-12 DIAGNOSIS — I82401 Acute embolism and thrombosis of unspecified deep veins of right lower extremity: Secondary | ICD-10-CM | POA: Diagnosis present

## 2019-04-12 DIAGNOSIS — K7689 Other specified diseases of liver: Secondary | ICD-10-CM | POA: Diagnosis not present

## 2019-04-12 DIAGNOSIS — I2699 Other pulmonary embolism without acute cor pulmonale: Secondary | ICD-10-CM | POA: Diagnosis not present

## 2019-04-12 DIAGNOSIS — I10 Essential (primary) hypertension: Secondary | ICD-10-CM | POA: Diagnosis not present

## 2019-04-12 DIAGNOSIS — M7989 Other specified soft tissue disorders: Secondary | ICD-10-CM | POA: Diagnosis not present

## 2019-04-12 DIAGNOSIS — I82501 Chronic embolism and thrombosis of unspecified deep veins of right lower extremity: Secondary | ICD-10-CM | POA: Diagnosis present

## 2019-04-12 DIAGNOSIS — I890 Lymphedema, not elsewhere classified: Secondary | ICD-10-CM

## 2019-04-12 DIAGNOSIS — I824Y1 Acute embolism and thrombosis of unspecified deep veins of right proximal lower extremity: Secondary | ICD-10-CM

## 2019-04-12 LAB — HIV ANTIBODY (ROUTINE TESTING W REFLEX): HIV Screen 4th Generation wRfx: NONREACTIVE

## 2019-04-12 LAB — CBC WITH DIFFERENTIAL/PLATELET
Abs Immature Granulocytes: 0.06 10*3/uL (ref 0.00–0.07)
Basophils Absolute: 0 10*3/uL (ref 0.0–0.1)
Basophils Relative: 0 %
Eosinophils Absolute: 0.4 10*3/uL (ref 0.0–0.5)
Eosinophils Relative: 5 %
HCT: 36 % (ref 36.0–46.0)
Hemoglobin: 11.3 g/dL — ABNORMAL LOW (ref 12.0–15.0)
Immature Granulocytes: 1 %
Lymphocytes Relative: 18 %
Lymphs Abs: 1.7 10*3/uL (ref 0.7–4.0)
MCH: 28.7 pg (ref 26.0–34.0)
MCHC: 31.4 g/dL (ref 30.0–36.0)
MCV: 91.4 fL (ref 80.0–100.0)
Monocytes Absolute: 0.7 10*3/uL (ref 0.1–1.0)
Monocytes Relative: 7 %
Neutro Abs: 6.8 10*3/uL (ref 1.7–7.7)
Neutrophils Relative %: 69 %
Platelets: 305 10*3/uL (ref 150–400)
RBC: 3.94 MIL/uL (ref 3.87–5.11)
RDW: 14.5 % (ref 11.5–15.5)
WBC: 9.8 10*3/uL (ref 4.0–10.5)
nRBC: 0 % (ref 0.0–0.2)

## 2019-04-12 LAB — BASIC METABOLIC PANEL
Anion gap: 10 (ref 5–15)
BUN: 9 mg/dL (ref 6–20)
CO2: 23 mmol/L (ref 22–32)
Calcium: 8.6 mg/dL — ABNORMAL LOW (ref 8.9–10.3)
Chloride: 102 mmol/L (ref 98–111)
Creatinine, Ser: 0.66 mg/dL (ref 0.44–1.00)
GFR calc Af Amer: >60 mL/min (ref >60)
GFR calc non Af Amer: >60 mL/min (ref >60)
Glucose, Bld: 129 mg/dL — ABNORMAL HIGH (ref 70–99)
Potassium: 3.7 mmol/L (ref 3.5–5.1)
Sodium: 135 mmol/L (ref 135–145)

## 2019-04-12 LAB — HCG, QUANTITATIVE, PREGNANCY: hCG, Beta Chain, Quant, S: <1 m[IU]/mL (ref <5)

## 2019-04-12 LAB — LACTIC ACID, PLASMA: Lactic Acid, Venous: 1.7 mmol/L (ref 0.5–1.9)

## 2019-04-12 MED ORDER — IOHEXOL 300 MG/ML  SOLN
125.0000 mL | Freq: Once | INTRAMUSCULAR | Status: AC | PRN
  Administered 2019-04-12: 125 mL via INTRAVENOUS

## 2019-04-12 MED ORDER — HYDROCODONE-ACETAMINOPHEN 5-325 MG PO TABS
2.0000 | ORAL_TABLET | Freq: Once | ORAL | Status: AC
  Administered 2019-04-12: 2 via ORAL
  Filled 2019-04-12: qty 2

## 2019-04-12 MED ORDER — ACETAMINOPHEN 650 MG RE SUPP: 650.0000 mg | Freq: Four times a day (QID) | RECTAL | Status: DC | PRN

## 2019-04-12 MED ORDER — BISACODYL 5 MG PO TBEC: 5.0000 mg | DELAYED_RELEASE_TABLET | Freq: Every day | ORAL | Status: DC | PRN

## 2019-04-12 MED ORDER — ONDANSETRON HCL 4 MG/2ML IJ SOLN: 4.0000 mg | Freq: Four times a day (QID) | INTRAMUSCULAR | Status: DC | PRN

## 2019-04-12 MED ORDER — SODIUM CHLORIDE 0.9% FLUSH
3.0000 mL | Freq: Two times a day (BID) | INTRAVENOUS | Status: DC
  Administered 2019-04-12 – 2019-04-13 (×2): 3 mL via INTRAVENOUS

## 2019-04-12 MED ORDER — SODIUM CHLORIDE 0.9 % IV SOLN: 250.0000 mL | INTRAVENOUS | Status: DC | PRN

## 2019-04-12 MED ORDER — CEFAZOLIN SODIUM-DEXTROSE 2-4 GM/100ML-% IV SOLN
2.0000 g | Freq: Once | INTRAVENOUS | Status: AC
  Administered 2019-04-12: 05:00:00 2 g via INTRAVENOUS
  Filled 2019-04-12: qty 100

## 2019-04-12 MED ORDER — HYDROCODONE-ACETAMINOPHEN 5-325 MG PO TABS
1.0000 | ORAL_TABLET | ORAL | Status: DC | PRN
  Administered 2019-04-12 – 2019-04-14 (×6): 1 via ORAL
  Filled 2019-04-12 (×2): qty 1
  Filled 2019-04-12: qty 2
  Filled 2019-04-12 (×3): qty 1
  Filled 2019-04-12: qty 2

## 2019-04-12 MED ORDER — SODIUM CHLORIDE 0.9% FLUSH: 3.0000 mL | INTRAVENOUS | Status: DC | PRN

## 2019-04-12 MED ORDER — AMLODIPINE BESYLATE 5 MG PO TABS
5.0000 mg | ORAL_TABLET | Freq: Every day | ORAL | Status: DC
  Administered 2019-04-12 – 2019-04-14 (×3): 5 mg via ORAL
  Filled 2019-04-12 (×3): qty 1

## 2019-04-12 MED ORDER — WARFARIN - PHARMACIST DOSING INPATIENT
Freq: Every day | Status: DC
  Administered 2019-04-12: 18:00:00

## 2019-04-12 MED ORDER — WARFARIN SODIUM 5 MG PO TABS
5.0000 mg | ORAL_TABLET | Freq: Every day | ORAL | Status: DC
  Filled 2019-04-12: qty 1

## 2019-04-12 MED ORDER — ACETAMINOPHEN 325 MG PO TABS: 650.0000 mg | ORAL_TABLET | Freq: Four times a day (QID) | ORAL | Status: DC | PRN

## 2019-04-12 MED ORDER — SENNOSIDES-DOCUSATE SODIUM 8.6-50 MG PO TABS: 1.0000 | ORAL_TABLET | Freq: Every evening | ORAL | Status: DC | PRN

## 2019-04-12 MED ORDER — SODIUM CHLORIDE 0.9% FLUSH
3.0000 mL | Freq: Two times a day (BID) | INTRAVENOUS | Status: DC
  Administered 2019-04-12 – 2019-04-13 (×2): 3 mL via INTRAVENOUS

## 2019-04-12 MED ORDER — WARFARIN SODIUM 5 MG PO TABS
5.0000 mg | ORAL_TABLET | Freq: Every day | ORAL | Status: DC
  Administered 2019-04-12: 5 mg via ORAL
  Filled 2019-04-12: qty 1

## 2019-04-12 MED ORDER — OXYCODONE HCL 5 MG PO TABS: 10.0000 mg | ORAL_TABLET | Freq: Four times a day (QID) | ORAL | Status: DC | PRN

## 2019-04-12 MED ORDER — ONDANSETRON HCL 4 MG PO TABS: 4.0000 mg | ORAL_TABLET | Freq: Four times a day (QID) | ORAL | Status: DC | PRN

## 2019-04-12 NOTE — ED Notes (Signed)
Report called to rn on 5 w 

## 2019-04-12 NOTE — Progress Notes (Signed)
ANTICOAGULATION CONSULT NOTE - Initial Consult  Pharmacy Consult for coumadin Indication: DVT  Allergies  Allergen Reactions  . Adhesive [Tape] Anaphylaxis and Swelling    *Adhesive Spray*     Patient Measurements: Height: 5\' 4"  (162.6 cm) Weight: (!) 460 lb (208.7 kg) IBW/kg (Calculated) : 54.7   Vital Signs: Temp: (P) 98.2 F (36.8 C) (04/19 2214) BP: 134/74 (04/20 0500) Pulse Rate: 95 (04/20 0500)  Labs: Recent Labs    04/11/19 2254  HGB 11.7*  HCT 36.6  PLT 309  LABPROT 29.6*  INR 2.9*  CREATININE 0.78    Estimated Creatinine Clearance: 163 mL/min (by C-G formula based on SCr of 0.78 mg/dL).   Medical History: Past Medical History:  Diagnosis Date  . Allergy   . Anxiety   . Depression   . GERD (gastroesophageal reflux disease)   . Hypertension   . Migraines   . PE (pulmonary thromboembolism) (HCC) 03/2019  . Sleep apnea     Medications:  Coumadin 5mg  po daily, last dose 4/19  Assessment: 43 yo lady with h/o PE to continue coumadin.  INR 2.9 Goal of Therapy:  INR 2-3 Monitor platelets by anticoagulation protocol: Yes   Plan:  Continue home coumadin dose 5mg  daily Daily PT/INR Thanks for allowing pharmacy to be a part of this patient's care.  Talbert Cage, PharmD Clinical Pharmacist 04/12/2019,5:08 AM

## 2019-04-12 NOTE — ED Notes (Addendum)
Bi-lateral pedal pulses present and strong   dopplered

## 2019-04-12 NOTE — ED Notes (Signed)
Reject x1 

## 2019-04-12 NOTE — Consult Note (Addendum)
[name] NOTE   MRN : [id number]  Reason for Consult: New right LE DVT with recent history of acute B PE  Referring Physician: Dr. Antionette Char  History of Present Illness: 43 y/o female with new right LE edema.  She states the leg pain started 2 weeks ago, and has progressed with edema and pain.  S/P acute B PE s/p pulmonary artery thrombolysis 4/6-4/7 and now on lifelong anticoagulation with Coumadin.  Her INR is 2.9 today.  CT venogram performed Findings concerning for DVT within the right femoral vein, profundus femoris and possibly popliteal vein. These vessels appear distended and indistinct.   We have been consulted for recommendations and or intervention.   Past medical hx: HTN and obesity.  She denise DM, PAD, and CAD.       Current Facility-Administered Medications  Medication Dose Route Frequency Provider Last Rate Last Dose  . 0.9 %  sodium chloride infusion  250 mL Intravenous PRN Opyd, Lavone Neri, MD      . acetaminophen (TYLENOL) tablet 650 mg  650 mg Oral Q6H PRN Opyd, Lavone Neri, MD       Or  . acetaminophen (TYLENOL) suppository 650 mg  650 mg Rectal Q6H PRN Opyd, Lavone Neri, MD      . amLODipine (NORVASC) tablet 5 mg  5 mg Oral Daily Opyd, Lavone Neri, MD      . bisacodyl (DULCOLAX) EC tablet 5 mg  5 mg Oral Daily PRN Opyd, Lavone Neri, MD      . HYDROcodone-acetaminophen (NORCO/VICODIN) 5-325 MG per tablet 1-2 tablet  1-2 tablet Oral Q4H PRN Opyd, Lavone Neri, MD      . ondansetron (ZOFRAN) tablet 4 mg  4 mg Oral Q6H PRN Opyd, Lavone Neri, MD       Or  . ondansetron (ZOFRAN) injection 4 mg  4 mg Intravenous Q6H PRN Opyd, Lavone Neri, MD      . oxyCODONE (Oxy IR/ROXICODONE) immediate release tablet 10 mg  10 mg Oral Q6H PRN Opyd, Lavone Neri, MD      . senna-docusate (Senokot-S) tablet 1 tablet  1 tablet Oral QHS PRN Opyd, Lavone Neri, MD      . sodium chloride flush (NS) 0.9 % injection 3 mL  3 mL Intravenous Q12H Opyd, Lavone Neri, MD      . sodium  chloride flush (NS) 0.9 % injection 3 mL  3 mL Intravenous Q12H Opyd, Timothy S, MD      . sodium chloride flush (NS) 0.9 % injection 3 mL  3 mL Intravenous PRN Opyd, Lavone Neri, MD      . warfarin (COUMADIN) tablet 5 mg  5 mg Oral q1800 Opyd, Lavone Neri, MD      . Warfarin - Pharmacist Dosing Inpatient   Does not apply q1800 Opyd, Lavone Neri, MD        Pt meds include: Statin :No Betablocker: No ASA: No Other anticoagulants/antiplatelets: Coumadin  Past Medical History:  Diagnosis Date  . Allergy   . Anxiety   . Depression   . GERD (gastroesophageal reflux disease)   . Hypertension   . Migraines   . PE (pulmonary thromboembolism) (HCC) 03/2019  . Sleep apnea     Past Surgical History:  Procedure Laterality Date  . CHOLECYSTECTOMY    . IR INFUSION THROMBOL ARTERIAL INITIAL (MS)  03/29/2019  . IR INFUSION THROMBOL ARTERIAL INITIAL (MS)  03/29/2019  . IR THROMB F/U EVAL ART/VEN FINAL DAY (MS)  03/30/2019  .  IR US GUIDE VASC ACCESS RIGHT  03/29/2019    Social History Social History   Tobacco Use  . Smoking status: Never Smoker  . Smokeless tobacco: Never Used  Substance Use Topics  . Alcohol use: Yes    Comment: occ  . Drug use: No    Family History Family History  Problem Relation Age of Onset  . Hypertension Mother   . Thyroid disease Mother   . Hypertension Maternal Grandmother   . Diabetes Maternal Grandmother   . Breast cancer Maternal Grandmother   . Stroke Maternal Grandmother   . Heart attack Neg Hx     Allergies  Allergen Reactions  . Adhesive [Tape] Anaphylaxis and Swelling    *Adhesive Spray*      REVIEW OF SYSTEMS  General: [ ]  Weight loss, [ ]  Fever, [ ]  chills Neurologic: [ ]  Dizziness, [ ]  Blackouts, [ ]  Seizure [ ]  Stroke, [ ]  "Mini stroke", [ ]  Slurred speech, [ ]  Temporary blindness; [ ]  weakness in arms or legs, [ ]  Hoarseness [ ]  Dysphagia Cardiac: [ ]  Chest pain/pressure, [ ]  Shortness of breath at rest [ ]  Shortness of breath with exertion,  [ ]  Atrial fibrillation or irregular heartbeat  Vascular: [ ]  Pain in legs with walking, [ ]  Pain in legs at rest, [ ]  Pain in legs at night,  [ ]  Non-healing ulcer, [ x] Blood clot in vein/DVT,   Pulmonary: [ ]  Home oxygen, [ ]  Productive cough, [ ]  Coughing up blood, [ ]  Asthma,  [ ]  Wheezing [ ]  COPD [x]  PE B Musculoskeletal:  [ ]  Arthritis, [ ]  Low back pain, [ ]  Joint pain Hematologic: [ ]  Easy Bruising, [ ]  Anemia; [ ]  Hepatitis Gastrointestinal: [ ]  Blood in stool, [ ]  Gastroesophageal Reflux/heartburn, Urinary: [ ]  chronic Kidney disease, [ ]  on HD - [ ]  MWF or [ ]  TTHS, [ ]  Burning with urination, [ ]  Difficulty urinating Skin: [ ]  Rashes, [ ]  Wounds Psychological: [x ] Anxiety, [ x] Depression  Physical Examination Vitals:   04/12/19 0500 04/12/19 0600 04/12/19 0651 04/12/19 0708  BP: 134/74 (!) 150/70 (!) 144/63   Pulse: 95 89 94   Resp:  18 20   Temp:   98.2 F (36.8 C)   TempSrc:   Oral   SpO2: 96% 95% 97%   Weight:    (!) 222 kg  Height:    5\' 4"  (1.626 m)   Body mass index is 84.01 kg/m.  General:  WDWN in NAD HENT: WNL, normocephalic Eyes: Pupils equal Pulmonary: normal non-labored breathing , without Rales, rhonchi,  wheezing Cardiac: RRR, without  Murmurs, rubs or gallops; No carotid bruits Abdomen: soft, NT, no masses Skin: no rashes, ulcers noted;  no Gangrene , no cellulitis; no open wounds;   Vascular Exam/Pulses:radial B palpable pulses, Brisk biphasic DP doppler signals B LE.  Sever lymph edema right LE edema > left.  Active range of motion and sensation intact B LE.   Musculoskeletal: no muscle wasting or atrophy Neurologic: A&O X 3; Appropriate Affect ;  SENSATION: normal; MOTOR FUNCTION: 5/5 Symmetric B LE, motor intact B LE. Speech is fluent/normal   Significant Diagnostic Studies: CBC Lab Results  Component Value Date   WBC 9.8 04/12/2019   HGB 11.3 (L) 04/12/2019   HCT 36.0 04/12/2019   MCV 91.4 04/12/2019   PLT 305 04/12/2019     BMET    Component Value Date/Time   NA 135 04/12/2019 0549  K 3.7 04/12/2019 0549   CL 102 04/12/2019 0549   CO2 23 04/12/2019 0549   GLUCOSE 129 (H) 04/12/2019 0549   BUN 9 04/12/2019 0549   CREATININE 0.66 04/12/2019 0549   CREATININE 0.74 05/09/2016 1327   CALCIUM 8.6 (L) 04/12/2019 0549   GFRNONAA >60 04/12/2019 0549   GFRAA >60 04/12/2019 0549   Estimated Creatinine Clearance: 170.5 mL/min (by C-G formula based on SCr of 0.66 mg/dL).  COAG Lab Results  Component Value Date   INR 2.9 (H) 04/11/2019   INR 2.1 (H) 04/05/2019   INR 2.2 (H) 04/04/2019     Non-Invasive Vascular Imaging:  03/30/2019 Summary: Right: There is no evidence of deep vein thrombosis in the lower extremity. However, portions of this examination were limited- see technologist comments above. No cystic structure found in the popliteal fossa. Left: There is no evidence of deep vein thrombosis in the lower extremity. However, portions of this examination were limited- see technologist comments above. No cystic structure found in the popliteal fossa.  CT venogram 04/12/2019 IMPRESSION: Findings concerning for DVT within the right femoral vein, profundus femoris and possibly popliteal vein. These vessels appear distended and indistinct. Recommend confirmation with right lower extremity venous ultrasound.  ASSESSMENT/PLAN:  Right LE DVT  right femoral vein, profundus femoris and possibly popliteal vein.  Without evidence of phlegmasia. She is therapeutic on Coumadin INR 2.9  She appears to have sever lymphedema B LE that has not been documented.  She states her left leg is at baseline with edema and deformity.    I will discuss the CT venogram with Dr. Darrick Penna and he will discuss a treatment plan of continued anticoagulation verses the need for lysis intervention of the right LE.  Susan Warren 04/12/2019 8:25 AM   History and exam findings as above.  Clinically pt most likely has right  leg DVT.  Her CT scan shows large hepatic cyst which has essentially caused subtotal occlusion of her IVC. The patient had this cyst  Most likely this is causing low flow state leading to DVT.  From the DVT standpoint she is not a candidate for thrombolysis due to her severe obesity.  She should continue her warfarin or if considered refractory could involve hematology to see if she is candidate for lifelong lovenox.  I would not place an IVC filter as I think this would be high risk to thrombose based on the compression of her IVC.  I have called Dr Donell Beers from General Surgery regarding the hepatic cyst and she will evaluate the patient.  Fabienne Bruns, MD [facility] [place] Office: [phone] Pager: [phone]

## 2019-04-12 NOTE — Progress Notes (Signed)
[facility] PROGRESS NOTE  Susan Warren ZOX:[id number] DOB: 08-Nov-1976 DOA: 04/11/2019 PCP: Storm FriskWright, Patrick E, MD  Assessment/Plan: 1. Right leg pain and swelling  Pt reports chronic bilateral LE edema  with progressive pain and worseing swelling involving RLE. CT venogram with DVT involving right femoral, profundus femoris, and possibly popliteal vein; there is also interval enlargement in liver cyst that compresses IVC and may also be contributing to her swelling. 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 Recommends and called general surgery for evaluation. Per vascular no candidate for IVC filter as high risk to thrombose.  - Continue anticoagulation   -await general surgery recommendations  2. PE, DVT  - Admitted earlier this month with acute massive b/l PE, treated with IV heparin and thrombolysis, discharged on warfarin. CT venogram 04/12/19 with DVT involving right femoral vein, profundus femoris v, and possibly popliteal v - Continue warfarin, INR is 2.9 on admission  -see #1   3. Hypertension Controlled - continue Norvasc as tolerated    4. Hyponatremia resolved this am -monitor  5.   Hepatic cyst/occlusion of IVC. Per CT scan. Evaluated by vascular surgery. See #1 -await general surgery   Code Status: full Family Communication: none present Disposition Plan: home when ready   Consultants:  Dr fields vascular surger  Procedures:  doppler  Antibiotics:    HPI/Subjective: 43 y/o female with new right LE edema.  She stated the leg pain started 2 weeks ago, and progressed with edema and pain.  S/P acute B PE s/p pulmonary artery thrombolysis 4/6-4/7 and now on lifelong anticoagulation with Coumadin.  Her INR is 2.9 today.  CT venogram performed Findings concerning for DVT within the right femoral vein, profundus femoris and possibly popliteal vein. These vessels appear  distended and indistinct. Vascular surgery and general surgery consulted  Objective: Vitals:   04/12/19 0651 04/12/19 1347  BP: (!) 144/63 136/68  Pulse: 94 81  Resp: 20 18  Temp: 98.2 F (36.8 C) 98.2 F (36.8 C)  SpO2: 97% 100%   No intake or output data in the 24 hours ending 04/12/19 1409 Filed Weights   04/11/19 2212 04/12/19 0708  Weight: (!) 208.7 kg (!) 222 kg    Exam:   General:  Awake lying in bed in no acute distress, morbidly obese  Cardiovascular: rrr no mgr bilateral LE lympedema. With right greater than left and right slightly pink.  Feet warm with PPP  Respiratory: normal effort BS clear bilaterally no wheeze  Abdomen: morbidly obese soft +BS no guarding or rebounding  Musculoskeletal: joint without swelling except as noted above.   Data Reviewed: Basic Metabolic Panel: Recent Labs  Lab 04/11/19 2254 04/12/19 0549  NA 133* 135  K 4.2 3.7  CL 103 102  CO2 25 23  GLUCOSE 138* 129*  BUN 10 9  CREATININE 0.78 0.66  CALCIUM 8.7* 8.6*   Liver Function Tests: No results for input(s): AST, ALT, ALKPHOS, BILITOT, PROT, ALBUMIN in the last 168 hours. No results for input(s): LIPASE, AMYLASE in the last 168 hours. No results for input(s): AMMONIA in the last 168 hours. CBC: Recent Labs  Lab 04/11/19 2254 04/12/19 0549  WBC 10.6* 9.8  NEUTROABS 7.9* 6.8  HGB 11.7* 11.3*  HCT 36.6 36.0  MCV 91.5 91.4  PLT 309 305   Cardiac Enzymes: No results for input(s): CKTOTAL, CKMB, CKMBINDEX, TROPONINI in the last 168 hours. BNP (last 3  results) No results for input(s): BNP in the last 8760 hours.  ProBNP (last 3 results) No results for input(s): PROBNP in the last 8760 hours.  CBG: No results for input(s): GLUCAP in the last 168 hours.  Recent Results (from the past 240 hour(s))  Blood culture (routine x 2)     Status: None (Preliminary result)   Collection Time: 04/12/19  3:16 AM  Result Value Ref Range Status   Specimen Description BLOOD LEFT  ARM  Final   Special Requests   Final    BOTTLES DRAWN AEROBIC AND ANAEROBIC Blood Culture adequate volume Performed at [facility], [address] [place], [place] [address]    Culture PENDING  Incomplete   Report Status PENDING  Incomplete  Blood culture (routine x 2)     Status: None (Preliminary result)   Collection Time: 04/12/19  3:16 AM  Result Value Ref Range Status   Specimen Description BLOOD RIGHT ARM  Final   Special Requests   Final    BOTTLES DRAWN AEROBIC ONLY Blood Culture results may not be optimal due to an excessive volume of blood received in culture bottles Performed at [facility], [address] [place], [place] [address]    Culture PENDING  Incomplete   Report Status PENDING  Incomplete     Studies: Ct Venogram Abd/pel  Result Date: 04/12/2019 CLINICAL DATA:  Right lower extremity swelling. EXAM: CT ABDOMEN AND PELVIS WITH CONTRAST (CT VENOGRAM) TECHNIQUE: Multidetector CT imaging of the abdomen and pelvis was performed using the standard protocol following bolus administration of intravenous contrast. CONTRAST:  OMNIPAQUE IOHEXOL 300 MG/ML  SOLN COMPARISON:  05/08/2017 FINDINGS: Lower chest: 11 mm nodule partially imaged on the 1st image in the right lower lobe. Heart is normal size. No effusions. Hepatobiliary: Prior cholecystectomy. Again noted is a large exophytic cyst arising from the inferior liver, extending into the upper pelvis, measuring 19 x 17 cm compared with 18.5 x 16.3 cm previously. Pancreas: No focal abnormality or ductal dilatation. Spleen: No focal abnormality.  Normal size. Adrenals/Urinary Tract: No adrenal abnormality. No focal renal abnormality. No stones or hydronephrosis. Urinary bladder is unremarkable. Stomach/Bowel: Stomach, large and small bowel grossly unremarkable. Vascular/Lymphatic: Scattered aortic atherosclerosis. Aorta is normal caliber. The IVC is compressed by the large exophytic attic cyst. Infrarenal IVC is  slit-like. The iliofemoral vessels appear patent although not well opacified and difficult to evaluate. The deep venous structures of the right lower extremity appear distended and indistinct, including the femoral vein, profundus femoris and popliteal vein. Findings concerning for deep venous thrombosis. Reproductive: Uterus and adnexa unremarkable. No mass. IUD in place. Other: No free fluid or free air. Musculoskeletal: No acute bony abnormality. Marked edema and swelling involving the visualized right lower extremity. IMPRESSION: Findings concerning for DVT within the right femoral vein, profundus femoris and possibly popliteal vein. These vessels appear distended and indistinct. Recommend confirmation with right lower extremity venous ultrasound. Large exophytic cyst arising from the inferior liver extending into the upper pelvis measuring up to 19 cm. This is slightly larger when compared to 2018 study when this measured up to 18.4 cm. This causes mass effect and compression of the IVC. Prior cholecystectomy. Electronically Signed   By: Charlett Nose M.D.   On: 04/12/2019 03:06    Scheduled Meds: . amLODipine  5 mg Oral Daily  . sodium chloride flush  3 mL Intravenous Q12H  . sodium chloride flush  3 mL Intravenous Q12H  .  warfarin  5 mg Oral q1800  . Warfarin - Pharmacist Dosing Inpatient   Does not apply q1800   Continuous Infusions: . sodium chloride      Principal Problem:   Right leg swelling Active Problems:   Hypertension   Right leg DVT (HCC)   Hepatic cyst   PE (pulmonary thromboembolism) (HCC)    Time spent: 45 minutes    [employer] M NP  [facility]  If 7PM-7AM, please contact night-coverage at [email or web address], password [facility] 04/12/2019, 2:09 PM  LOS: 0 days

## 2019-04-12 NOTE — ED Notes (Signed)
To ct

## 2019-04-12 NOTE — H&P (Signed)
History and Physical    Susan MostBuffie L Sylvestre ZOX:[id number] DOB: 03/31/1976 DOA: 04/11/2019  PCP: Storm FriskWright, Patrick E, MD   Patient coming from: Home   Chief Complaint: Right leg pain and swelling   HPI: Susan Warren is a 43 y.o. female with medical history significant for obesity (BMI 79), hypertension, and PE on warfarin, now presenting to the emergency department for evaluation of progressive pain and swelling involving the right leg.  Patient reports that the right leg pain and swelling began approximately 2 weeks ago when she was in the hospital and being managed for massive bilateral PE.  Pain has slowly progressed since then and she has also developed progressive edema.  Pain is mainly in the proximal and anteromedial aspect of the leg.  She reports that her chronic left leg swelling is at baseline.  She denies any fevers or chills, denies any cough, shortness of breath, or chest pain, and denies any melena or hematochezia.  She has not noticed much redness involving the right leg and has not noted any wounds or boils or drainage.  ED Course: Upon arrival to the ED, patient is found to be saturating well on room air and with other vitals also stable.  Chemistry panel notable for mild chronic hyponatremia and CBC features a slight leukocytosis.  Lactic acid is reassuringly normal and INR is therapeutic at 2.9.  CT venogram was performed with concern for right femoral vein, profundus femoris vein, and possibly right popliteal vein DVT, as well as a large exophytic cyst arising from the inferior liver and extending into the upper pelvis causing mass-effect and compression of the IVC.  Vascular surgery was consulted by the ED physician and recommended a medical admission.  Review of Systems:  All other systems reviewed and apart from HPI, are negative.  Past Medical History:  Diagnosis Date  . Allergy   . Anxiety   . Depression   . GERD (gastroesophageal reflux disease)   . Hypertension   .  Migraines   . PE (pulmonary thromboembolism) (HCC) 03/2019  . Sleep apnea     Past Surgical History:  Procedure Laterality Date  . CHOLECYSTECTOMY    . IR INFUSION THROMBOL ARTERIAL INITIAL (MS)  03/29/2019  . IR INFUSION THROMBOL ARTERIAL INITIAL (MS)  03/29/2019  . IR THROMB F/U EVAL ART/VEN FINAL DAY (MS)  03/30/2019  . IR US GUIDE VASC ACCESS RIGHT  03/29/2019     reports that she has never smoked. She has never used smokeless tobacco. She reports current alcohol use. She reports that she does not use drugs.  Allergies  Allergen Reactions  . Adhesive [Tape] Anaphylaxis and Swelling    *Adhesive Spray*     Family History  Problem Relation Age of Onset  . Hypertension Mother   . Thyroid disease Mother   . Hypertension Maternal Grandmother   . Diabetes Maternal Grandmother   . Breast cancer Maternal Grandmother   . Stroke Maternal Grandmother   . Heart attack Neg Hx      Prior to Admission medications   Medication Sig Start Date End Date Taking? Authorizing Provider  amLODipine (NORVASC) 5 MG tablet Take 1 tablet (5 mg total) by mouth daily. 02/21/19  Yes Harlene SaltsMorelli, Brandon A, PA-C  warfarin (COUMADIN) 5 MG tablet Take 1 tablet (5 mg total) by mouth one time only at 6 PM for 30 days. 04/05/19 05/05/19 Yes Jerald Kiefhiu, Stephen K, MD    Physical Exam: Vitals:   04/12/19 0300 04/12/19 0330 04/12/19 0400  04/12/19 0430  BP: 134/69 (!) 145/57 (!) 149/75 131/64  Pulse: 91 96 99 93  Resp:      Temp:      SpO2: 100% 99% 99% 95%  Weight:      Height:        Constitutional: NAD, calm  Eyes: PERTLA, lids and conjunctivae normal ENMT: Mucous membranes are moist. Posterior pharynx clear of any exudate or lesions.   Neck: normal, supple, no masses, no thyromegaly Respiratory: clear to auscultation bilaterally, no wheezing, no crackles. Normal respiratory effort.  Cardiovascular: S1 & S2 heard, regular rate and rhythm. Bilateral LE swelling. Abdomen: No distension, no tenderness, soft. Bowel  sounds normal.  Musculoskeletal: no clubbing / cyanosis. No joint deformity upper and lower extremities.    Skin: no significant rashes, lesions, ulcers. Warm, dry, well-perfused. Neurologic: CN 2-12 grossly intact. Sensation intact. Strength 5/5 in all 4 limbs.  Psychiatric: Alert and oriented x 3. Calm, cooperative.    Labs on Admission: I have personally reviewed following labs and imaging studies  CBC: Recent Labs  Lab 04/11/19 2254  WBC 10.6*  NEUTROABS 7.9*  HGB 11.7*  HCT 36.6  MCV 91.5  PLT 309   Basic Metabolic Panel: Recent Labs  Lab 04/11/19 2254  NA 133*  K 4.2  CL 103  CO2 25  GLUCOSE 138*  BUN 10  CREATININE 0.78  CALCIUM 8.7*   GFR: Estimated Creatinine Clearance: 163 mL/min (by C-G formula based on SCr of 0.78 mg/dL). Liver Function Tests: No results for input(s): AST, ALT, ALKPHOS, BILITOT, PROT, ALBUMIN in the last 168 hours. No results for input(s): LIPASE, AMYLASE in the last 168 hours. No results for input(s): AMMONIA in the last 168 hours. Coagulation Profile: Recent Labs  Lab 04/11/19 2254  INR 2.9*   Cardiac Enzymes: No results for input(s): CKTOTAL, CKMB, CKMBINDEX, TROPONINI in the last 168 hours. BNP (last 3 results) No results for input(s): PROBNP in the last 8760 hours. HbA1C: No results for input(s): HGBA1C in the last 72 hours. CBG: No results for input(s): GLUCAP in the last 168 hours. Lipid Profile: No results for input(s): CHOL, HDL, LDLCALC, TRIG, CHOLHDL, LDLDIRECT in the last 72 hours. Thyroid Function Tests: No results for input(s): TSH, T4TOTAL, FREET4, T3FREE, THYROIDAB in the last 72 hours. Anemia Panel: No results for input(s): VITAMINB12, FOLATE, FERRITIN, TIBC, IRON, RETICCTPCT in the last 72 hours. Urine analysis:    Component Value Date/Time   COLORURINE YELLOW 05/08/2017 1214   APPEARANCEUR CLEAR 05/08/2017 1214   LABSPEC 1.014 05/08/2017 1214   PHURINE 6.0 05/08/2017 1214   GLUCOSEU NEGATIVE 05/08/2017  1214   GLUCOSEU NEGATIVE 01/25/2014 1049   HGBUR LARGE (A) 05/08/2017 1214   BILIRUBINUR NEGATIVE 05/08/2017 1214   KETONESUR NEGATIVE 05/08/2017 1214   PROTEINUR NEGATIVE 05/08/2017 1214   UROBILINOGEN 0.2 01/25/2014 1049   NITRITE NEGATIVE 05/08/2017 1214   LEUKOCYTESUR MODERATE (A) 05/08/2017 1214   Sepsis Labs: @LABRCNTIP (procalcitonin:4,lacticidven:4) )No results found for this or any previous visit (from the past 240 hour(s)).   Radiological Exams on Admission: Ct Venogram Abd/pel  Result Date: 04/12/2019 CLINICAL DATA:  Right lower extremity swelling. EXAM: CT ABDOMEN AND PELVIS WITH CONTRAST (CT VENOGRAM) TECHNIQUE: Multidetector CT imaging of the abdomen and pelvis was performed using the standard protocol following bolus administration of intravenous contrast. CONTRAST:  OMNIPAQUE IOHEXOL 300 MG/ML  SOLN COMPARISON:  05/08/2017 FINDINGS: Lower chest: 11 mm nodule partially imaged on the 1st image in the right lower lobe. Heart is normal  size. No effusions. Hepatobiliary: Prior cholecystectomy. Again noted is a large exophytic cyst arising from the inferior liver, extending into the upper pelvis, measuring 19 x 17 cm compared with 18.5 x 16.3 cm previously. Pancreas: No focal abnormality or ductal dilatation. Spleen: No focal abnormality.  Normal size. Adrenals/Urinary Tract: No adrenal abnormality. No focal renal abnormality. No stones or hydronephrosis. Urinary bladder is unremarkable. Stomach/Bowel: Stomach, large and small bowel grossly unremarkable. Vascular/Lymphatic: Scattered aortic atherosclerosis. Aorta is normal caliber. The IVC is compressed by the large exophytic attic cyst. Infrarenal IVC is slit-like. The iliofemoral vessels appear patent although not well opacified and difficult to evaluate. The deep venous structures of the right lower extremity appear distended and indistinct, including the femoral vein, profundus femoris and popliteal vein. Findings concerning for  deep venous thrombosis. Reproductive: Uterus and adnexa unremarkable. No mass. IUD in place. Other: No free fluid or free air. Musculoskeletal: No acute bony abnormality. Marked edema and swelling involving the visualized right lower extremity. IMPRESSION: Findings concerning for DVT within the right femoral vein, profundus femoris and possibly popliteal vein. These vessels appear distended and indistinct. Recommend confirmation with right lower extremity venous ultrasound. Large exophytic cyst arising from the inferior liver extending into the upper pelvis measuring up to 19 cm. This is slightly larger when compared to 2018 study when this measured up to 18.4 cm. This causes mass effect and compression of the IVC. Prior cholecystectomy. Electronically Signed   By: Charlett Nose M.D.   On: 04/12/2019 03:06    EKG: Not performed.   Assessment/Plan   1. Right leg pain and swelling  - Pt reports chronic bilateral LE edema and now presents with progressive pain and swelling involving RLE  - CT venogram with DVT involving right femoral, profundus femoris, and possibly popliteal vein; there is also interval enlargement in liver cyst that compresses IVC and may also be contributing to her swelling   - The compartments remain soft, pedal pulse is present, and there is no significant discoloration; no fever or redness to suggest an infectious component  - Continue anticoagulation and follow-up vascular surgery recommendations    2. PE, DVT  - Admitted earlier this month with acute massive b/l PE, treated with IV heparin and thrombolysis, discharged on warfarin  - CT venogram 04/12/19 with DVT involving right femoral vein, profundus femoris v, and possibly popliteal v - Continue warfarin, INR is 2.9 on admission    3. Hypertension  - BP at goal, continue Norvasc as tolerated    4. Hyponatremia  - Mild hyponatremia appears chronic, stable     PPE: Mask, face shield; pt in mask  DVT prophylaxis: warfarin   Code Status: Full  Family Communication: Discussed with patient  Consults called: Vascular surgery  Admission status: Observation     Briscoe Deutscher, MD [facility] Pager [phone]  If 7PM-7AM, please contact night-coverage [email or web address] Password [facility]  04/12/2019, 4:56 AM

## 2019-04-13 ENCOUNTER — Encounter (HOSPITAL_COMMUNITY): Payer: Self-pay | Admitting: General Surgery

## 2019-04-13 DIAGNOSIS — I890 Lymphedema, not elsewhere classified: Secondary | ICD-10-CM | POA: Diagnosis not present

## 2019-04-13 DIAGNOSIS — I10 Essential (primary) hypertension: Secondary | ICD-10-CM | POA: Diagnosis not present

## 2019-04-13 DIAGNOSIS — M7989 Other specified soft tissue disorders: Secondary | ICD-10-CM | POA: Diagnosis not present

## 2019-04-13 DIAGNOSIS — K7689 Other specified diseases of liver: Secondary | ICD-10-CM | POA: Diagnosis not present

## 2019-04-13 LAB — GLUCOSE, CAPILLARY
Glucose-Capillary: 110 mg/dL — ABNORMAL HIGH (ref 70–99)
Glucose-Capillary: 103 mg/dL — ABNORMAL HIGH (ref 70–99)
Glucose-Capillary: 98 mg/dL (ref 70–99)
Glucose-Capillary: 95 mg/dL (ref 70–99)

## 2019-04-13 LAB — CBC
HCT: 36.8 % (ref 36.0–46.0)
Hemoglobin: 11.7 g/dL — ABNORMAL LOW (ref 12.0–15.0)
MCH: 28.4 pg (ref 26.0–34.0)
MCHC: 31.8 g/dL (ref 30.0–36.0)
MCV: 89.3 fL (ref 80.0–100.0)
Platelets: 311 10*3/uL (ref 150–400)
RBC: 4.12 MIL/uL (ref 3.87–5.11)
RDW: 14.4 % (ref 11.5–15.5)
WBC: 7.8 10*3/uL (ref 4.0–10.5)
nRBC: 0 % (ref 0.0–0.2)

## 2019-04-13 LAB — BASIC METABOLIC PANEL
Anion gap: 11 (ref 5–15)
BUN: 9 mg/dL (ref 6–20)
CO2: 24 mmol/L (ref 22–32)
Calcium: 8.5 mg/dL — ABNORMAL LOW (ref 8.9–10.3)
Chloride: 101 mmol/L (ref 98–111)
Creatinine, Ser: 0.75 mg/dL (ref 0.44–1.00)
GFR calc Af Amer: >60 mL/min (ref >60)
GFR calc non Af Amer: >60 mL/min (ref >60)
Glucose, Bld: 122 mg/dL — ABNORMAL HIGH (ref 70–99)
Potassium: 3.6 mmol/L (ref 3.5–5.1)
Sodium: 136 mmol/L (ref 135–145)

## 2019-04-13 LAB — PROTIME-INR
INR: 3.4 — ABNORMAL HIGH (ref 0.8–1.2)
Prothrombin Time: 34 seconds — ABNORMAL HIGH (ref 11.4–15.2)

## 2019-04-13 NOTE — Progress Notes (Signed)
ANTICOAGULATION CONSULT NOTE Pharmacy Consult for coumadin Indication: DVT  Allergies  Allergen Reactions  . Adhesive [Tape] Anaphylaxis and Swelling    *Adhesive Spray*     Patient Measurements: Height: 5\' 4"  (162.6 cm) Weight: (!) 489 lb 6.7 oz (222 kg) IBW/kg (Calculated) : 54.7   Vital Signs: BP: 133/69 (04/21 0540) Pulse Rate: 85 (04/21 0540)  Labs: Recent Labs    04/11/19 2254 04/12/19 0549 04/13/19 0443  HGB 11.7* 11.3* 11.7*  HCT 36.6 36.0 36.8  PLT 309 305 311  LABPROT 29.6*  --  34.0*  INR 2.9*  --  3.4*  CREATININE 0.78 0.66 0.75    Estimated Creatinine Clearance: 170.5 mL/min (by C-G formula based on SCr of 0.75 mg/dL).   Medical History: Past Medical History:  Diagnosis Date  . Allergy   . Anxiety   . Depression   . DVT (deep venous thrombosis) (HCC)   . GERD (gastroesophageal reflux disease)   . Hepatic cyst   . Hypertension   . Lymphedema of both lower extremities   . Migraines   . PE (pulmonary thromboembolism) (HCC) 03/2019  . Sleep apnea       Assessment: 43 yo lady with h/o PE to continue coumadin.  INR on admission 2.9 INR today 3.4  Goal of Therapy:  INR 2-3 Monitor platelets by anticoagulation protocol: Yes   Plan:  Hold warfarin today Daily PT/INR  Thanks for allowing pharmacy to be a part of this patient's care. Okey Regal, PharmD [phone]  04/13/2019,10:17 AM

## 2019-04-13 NOTE — Evaluation (Signed)
Physical Therapy Evaluation Patient Details Name: Susan Warren MRN: [id number] DOB: January 22, 1976 Today's Date: 04/13/2019   History of Present Illness  Pt is a 43 y.o. female admitted 04/11/19 with BLE pain and swelling. CT venogram with DVT involving R femoral, profundus femoris, and possibly popliteal vein; there is also interval enlargement in liver cyst that compresses IVC and may also be contributing to her swelling. Per vascular no candidate for IVC filter as high risk to thrombose. Of note, recent admission 03/29/2019 with acute massive bilateral PE d/c on Warfarin. PMH includes HTN, hepatic cyst, depression, morbid obesity (BMI 84).    Clinical Impression  Pt presents with an overall decrease in functional mobility secondary to above. PTA, pt indep and lives with son. Today, pt performing ADLs and ambulating in room at supervision-level; moving well despite c/o RLE pain. Educ re: current condition, therex, importance of mobility. Pt would benefit from continued acute PT services to maximize functional mobility and independence prior to d/c home.     Follow Up Recommendations No PT follow up;Supervision - Intermittent    Equipment Recommendations  None recommended by PT    Recommendations for Other Services       Precautions / Restrictions Precautions Precautions: Fall Precaution Comments: RLE DVT Restrictions Weight Bearing Restrictions: No      Mobility  Bed Mobility Overal bed mobility: Needs Assistance Bed Mobility: Supine to Sit     Supine to sit: Mod assist     General bed mobility comments: Pt indep to maneuver trunk and LLE, only requiring assist to move RLE towards EOB; able to assist with hip ADD  Transfers Overall transfer level: Needs assistance   Transfers: Sit to/from Stand Sit to Stand: Supervision            Ambulation/Gait Ambulation/Gait assistance: Supervision Gait Distance (Feet): 30 Feet Assistive device: None Gait Pattern/deviations:  Step-through pattern;Decreased stride length;Wide base of support;Antalgic Gait velocity: Decreased   General Gait Details: Slow, antalgic gait in room without device; supervision for safety. Declined use of DME to offload RLE pain  Stairs            Wheelchair Mobility    Modified Rankin (Stroke Patients Only)       Balance Overall balance assessment: Needs assistance   Sitting balance-Leahy Scale: Good       Standing balance-Leahy Scale: Good                               Pertinent Vitals/Pain Pain Assessment: Faces Faces Pain Scale: Hurts little more Pain Location: R upper/medial thigh Pain Descriptors / Indicators: Constant;Discomfort Pain Intervention(s): Limited activity within patient's tolerance;Monitored during session    Home Living Family/patient expects to be discharged to:: Private residence Living Arrangements: Children Available Help at Discharge: Family;Available 24 hours/day Type of Home: House Home Access: Stairs to enter Entrance Stairs-Rails: Right Entrance Stairs-Number of Steps: 3 Home Layout: One level Home Equipment: Cane - single point Additional Comments: Lives with 66 y.o. son    Prior Function Level of Independence: Independent with assistive device(s)         Comments: Intermittent use of SPC due to RLE pain     Hand Dominance        Extremity/Trunk Assessment   Upper Extremity Assessment Upper Extremity Assessment: Overall WFL for tasks assessed    Lower Extremity Assessment Lower Extremity Assessment: RLE deficits/detail RLE Deficits / Details: WFL for ambulation, but knee flex/ext  limited by pain RLE: Unable to fully assess due to pain RLE Coordination: decreased gross motor       Communication   Communication: No difficulties  Cognition Arousal/Alertness: Awake/alert Behavior During Therapy: WFL for tasks assessed/performed Overall Cognitive Status: Within Functional Limits for tasks  assessed                                        General Comments      Exercises General Exercises - Lower Extremity Ankle Circles/Pumps: AROM;Both;Supine Long Arc Quad: AROM;Both;Seated   Assessment/Plan    PT Assessment Patient needs continued PT services  PT Problem List Decreased strength;Decreased activity tolerance;Decreased mobility;Pain       PT Treatment Interventions DME instruction;Gait training;Stair training;Functional mobility training;Therapeutic activities;Therapeutic exercise;Balance training;Patient/family education    PT Goals (Current goals can be found in the Care Plan section)  Acute Rehab PT Goals Patient Stated Goal: Less leg pain PT Goal Formulation: With patient Time For Goal Achievement: 04/27/19 Potential to Achieve Goals: Good    Frequency Min 3X/week   Barriers to discharge        Co-evaluation               AM-PAC PT "6 Clicks" Mobility  Outcome Measure Help needed turning from your back to your side while in a flat bed without using bedrails?: A Lot Help needed moving from lying on your back to sitting on the side of a flat bed without using bedrails?: A Lot Help needed moving to and from a bed to a chair (including a wheelchair)?: None Help needed standing up from a chair using your arms (e.g., wheelchair or bedside chair)?: None Help needed to walk in hospital room?: A Little Help needed climbing 3-5 steps with a railing? : A Little 6 Click Score: 18    End of Session   Activity Tolerance: Patient tolerated treatment well;Patient limited by pain Patient left: in bed;with call bell/phone within reach(seated EOB) Nurse Communication: Mobility status PT Visit Diagnosis: Other abnormalities of gait and mobility (R26.89);Pain Pain - Right/Left: Right Pain - part of body: Leg    Time: [id number] PT Time Calculation (min) (ACUTE ONLY): 17 min   Charges:   PT Evaluation $PT Eval Low Complexity: 1 Low         Ina HomesJaclyn Shivaun Bilello, PT, DPT Acute Rehabilitation Services  Pager [phone] Office [phone]  Malachy ChamberJaclyn L Heydy Montilla 04/13/2019, 4:58 PM

## 2019-04-13 NOTE — Consult Note (Signed)
WOC Nurse wound consult note Remote consult completed with assistance of bedside RN, Huntley Dec. There are no open wounds. Patient (+) for RLE DVT and likely lymphedema per MD notes.  Vascular team is managing DVT and compression is not advisable at this time due to that.  Will defer to vascular team.   Will not follow at this time.  Please re-consult if needed.  Maple Hudson MSN, RN, FNP-BC CWON Wound, Ostomy, Continence Nurse Pager [phone]

## 2019-04-13 NOTE — Progress Notes (Addendum)
[facility] PROGRESS NOTE  Susan Warren:[id number] DOB: [birth year] DOA: 04/11/2019 PCP: Storm Frisk, MD  Assessment/Plan:  1.Right leg pain and swellingPt reports chronic bilateral LE edema  with progressive pain and worseing swelling involving RLE.CT venogram with DVT involving right femoral, profundus femoris, and possibly popliteal vein; there is also interval enlargement in liver cyst that compresses IVC and may also be contributing to her swelling. Evaluated by vascularwho opine likely DVTrelated to low flow secondary to subtotal occlusion of her IVC from hepatic cyst. Recommends and called general surgery for evaluation. Per vascular no candidate for IVC filter as high risk to thrombose.  -Continue anticoagulation -await general surgery recommendations  2.PE, DVTAdmitted earlier this month with acute massive b/l PE, treated with IV heparin and thrombolysis, discharged on warfarin.CT venogram 04/12/19 with DVT involving right femoral vein, profundus femoris v, and possibly popliteal v. INR 3.4 today. -Continue warfarin per pharmacy -see #1  3.HypertensionControlled - continue Norvasc as tolerated  4.Hyponatremiaresolved this am -monitor  5.  Hepatic cyst/occlusion of IVC. Per CT scan. Evaluated by vascular surgery. See #1 -await general surgery    Code Status: full Family Communication: none present Disposition Plan: home when ready   Consultants:  Fields vascular  Byerly gen surgery  Procedures:  doppler  Antibiotics:    HPI/Subjective: 43 y/o female with new right LE edema. She stated the leg pain started 2 weeks ago, and progressed with edema and pain. S/P acute B PE s/ppulmonary arterythrombolysis 4/6-4/7and now on lifelong anticoagulation with Coumadin. Her INR is 3.2 today. CT venogram performed Findings concerning for DVT within the right femoral vein, profundus femoris and possibly popliteal vein. These  vessels appear distended and indistinct.Vascular surgery and general surgery consulted   Objective: Vitals:   04/12/19 2110 04/13/19 0540  BP: 135/73 133/69  Pulse: 89 85  Resp: 16 14  Temp: 98 F (36.7 C)   SpO2: 100% 99%    Intake/Output Summary (Last 24 hours) at 04/13/2019 1429 Last data filed at 04/13/2019 1000 Gross per 24 hour  Intake 600 ml  Output 2450 ml  Net -1850 ml   Filed Weights   04/11/19 2212 04/12/19 0708  Weight: (!) 208.7 kg (!) 222 kg    Exam:   General:  Awake alert watching TV. Very poor dentition.  Cardiovascular: RRR no mgr 3+ LE edema right greater than left  Respiratory: normal effort BS clear bilaterally no wheeze  Abdomen: obese soft +BS no guarding or reboundig  Musculoskeletal: joints without swelling/erythema  Data Reviewed: Basic Metabolic Panel: Recent Labs  Lab 04/11/19 2254 04/12/19 0549 04/13/19 0443  NA 133* 135 136  K 4.2 3.7 3.6  CL 103 102 101  CO2 GLUCOSE 138* 129* 122*  BUN CREATININE 0.78 0.66 0.75  CALCIUM 8.7* 8.6* 8.5*   Liver Function Tests: No results for input(s): AST, ALT, ALKPHOS, BILITOT, PROT, ALBUMIN in the last 168 hours. No results for input(s): LIPASE, AMYLASE in the last 168 hours. No results for input(s): AMMONIA in the last 168 hours. CBC: Recent Labs  Lab 04/11/19 2254 04/12/19 0549 04/13/19 0443  WBC 10.6* 9.8 7.8  NEUTROABS 7.9* 6.8  --   HGB 11.7* 11.3* 11.7*  HCT 36.6 36.0 36.8  MCV 91.5 91.4 89.3  PLT 309 305 311   Cardiac Enzymes: No results for input(s): CKTOTAL, CKMB, CKMBINDEX, TROPONINI in the last 168 hours. BNP (last 3 results) No results for input(s): BNP in the last  8760 hours.  ProBNP (last 3 results) No results for input(s): PROBNP in the last 8760 hours.  CBG: Recent Labs  Lab 04/13/19 0748 04/13/19 1123  GLUCAP 110* 103*    Recent Results (from the past 240 hour(s))  Blood culture (routine x 2)     Status: None (Preliminary result)    Collection Time: 04/12/19  3:16 AM  Result Value Ref Range Status   Specimen Description BLOOD LEFT ARM  Final   Special Requests   Final    BOTTLES DRAWN AEROBIC AND ANAEROBIC Blood Culture adequate volume   Culture   Final    NO GROWTH 1 DAY Performed at [facility], [address] [place], [place] [address]    Report Status PENDING  Incomplete  Blood culture (routine x 2)     Status: None (Preliminary result)   Collection Time: 04/12/19  3:16 AM  Result Value Ref Range Status   Specimen Description BLOOD RIGHT ARM  Final   Special Requests   Final    BOTTLES DRAWN AEROBIC ONLY Blood Culture results may not be optimal due to an excessive volume of blood received in culture bottles   Culture   Final    NO GROWTH 1 DAY Performed at [facility], [address] [place], [place] [address]    Report Status PENDING  Incomplete     Studies: Ct Venogram Abd/pel  Result Date: 04/12/2019 CLINICAL DATA:  Right lower extremity swelling. EXAM: CT ABDOMEN AND PELVIS WITH CONTRAST (CT VENOGRAM) TECHNIQUE: Multidetector CT imaging of the abdomen and pelvis was performed using the standard protocol following bolus administration of intravenous contrast. CONTRAST:  OMNIPAQUE IOHEXOL 300 MG/ML  SOLN COMPARISON:  05/08/2017 FINDINGS: Lower chest: 11 mm nodule partially imaged on the 1st image in the right lower lobe. Heart is normal size. No effusions. Hepatobiliary: Prior cholecystectomy. Again noted is a large exophytic cyst arising from the inferior liver, extending into the upper pelvis, measuring 19 x 17 cm compared with 18.5 x 16.3 cm previously. Pancreas: No focal abnormality or ductal dilatation. Spleen: No focal abnormality.  Normal size. Adrenals/Urinary Tract: No adrenal abnormality. No focal renal abnormality. No stones or hydronephrosis. Urinary bladder is unremarkable. Stomach/Bowel: Stomach, large and small bowel grossly unremarkable. Vascular/Lymphatic: Scattered  aortic atherosclerosis. Aorta is normal caliber. The IVC is compressed by the large exophytic attic cyst. Infrarenal IVC is slit-like. The iliofemoral vessels appear patent although not well opacified and difficult to evaluate. The deep venous structures of the right lower extremity appear distended and indistinct, including the femoral vein, profundus femoris and popliteal vein. Findings concerning for deep venous thrombosis. Reproductive: Uterus and adnexa unremarkable. No mass. IUD in place. Other: No free fluid or free air. Musculoskeletal: No acute bony abnormality. Marked edema and swelling involving the visualized right lower extremity. IMPRESSION: Findings concerning for DVT within the right femoral vein, profundus femoris and possibly popliteal vein. These vessels appear distended and indistinct. Recommend confirmation with right lower extremity venous ultrasound. Large exophytic cyst arising from the inferior liver extending into the upper pelvis measuring up to 19 cm. This is slightly larger when compared to 2018 study when this measured up to 18.4 cm. This causes mass effect and compression of the IVC. Prior cholecystectomy. Electronically Signed   By: Charlett Nose M.D.   On: 04/12/2019 03:06    Scheduled Meds: . amLODipine  5 mg Oral Daily  . sodium chloride flush  3 mL Intravenous Q12H  . sodium  chloride flush  3 mL Intravenous Q12H  . Warfarin - Pharmacist Dosing Inpatient   Does not apply q1800   Continuous Infusions: . sodium chloride      Principal Problem:   Right leg swelling Active Problems:   Hypertension   Right leg DVT (HCC)   Hepatic cyst   PE (pulmonary thromboembolism) (HCC)   Lymphedema of both lower extremities    Time spent: 45 minutes    [facility] AlbanyBLACK,Lizzie An M NP  [facility]  If 7PM-7AM, please contact night-coverage at [email or web address], password [email or web address] 04/13/2019, 2:29 PM  LOS: 0 days

## 2019-04-13 NOTE — Consult Note (Signed)
Reason for Consult:  large hepatic cyst.   Referring Physician: Fabienne BrunsFields  Susan Warren is an 43 y.o. female.  HPI:  Pt is a 43 yo F who [facility] with [facility] distress and bilateral PE.  She was anticoagulated and discharged 4/13. She received thrombolysis due to size of PEs and symptomatology.  She was readmitted 4/20 with progressive right leg pain and swelling.  She was found to have a RLE DVT.  Vascular was consulted to see if she had failed anticoagulation and whether she needed a filter or venous thrombectomy.  Venogram showed that her large hepatic cyst appeared to be compressing the IVC.  In terms of ambulation and mobility, she broke one of her right toes in February and has been less mobile.  She was getting back to her mobility when the right leg pain and swelling gave her issue.    Her BMI is over 70.  She has no history of right abdominal pain.  She has had hepatic mass/cyst seen on abd imaging here since 2018.  MRI was recommended, but BMI did not allow performance.  She did not have cyst seen on abd ultrasound in 1999.  She does not have personal history of cancer.  MGM had breast cancer.    Past Medical History:  Diagnosis Date  . Allergy   . Anxiety   . Depression   . DVT (deep venous thrombosis) (HCC)   . GERD (gastroesophageal reflux disease)   . Hepatic cyst   . Hypertension   . Lymphedema of both lower extremities   . Migraines   . Morbid obesity with BMI of 70 and over, adult (HCC)   . PE (pulmonary thromboembolism) (HCC) 03/2019  . Sleep apnea     Past Surgical History:  Procedure Laterality Date  . CHOLECYSTECTOMY    . IR INFUSION THROMBOL ARTERIAL INITIAL (MS)  03/29/2019  . IR INFUSION THROMBOL ARTERIAL INITIAL (MS)  03/29/2019  . IR THROMB F/U EVAL ART/VEN FINAL DAY (MS)  03/30/2019  . IR US GUIDE VASC ACCESS RIGHT  03/29/2019    Family History  Problem Relation Age of Onset  . Hypertension Mother   . Thyroid disease Mother   . Hypertension  Maternal Grandmother   . Diabetes Maternal Grandmother   . Breast cancer Maternal Grandmother   . Stroke Maternal Grandmother   . Heart attack Neg Hx     Social History:  reports that she has never smoked. She has never used smokeless tobacco. She reports current alcohol use. She reports that she does not use drugs.  Allergies:  Allergies  Allergen Reactions  . Adhesive [Tape] Anaphylaxis and Swelling    *Adhesive Spray*     Medications:  Current Meds  Medication Sig  . amLODipine (NORVASC) 5 MG tablet Take 1 tablet (5 mg total) by mouth daily.  Marland Kitchen. warfarin (COUMADIN) 5 MG tablet Take 1 tablet (5 mg total) by mouth one time only at 6 PM for 30 days.     Results for orders placed or performed during the hospital encounter of 04/11/19 (from the past 48 hour(s))  Basic metabolic panel     Status: Abnormal   Collection Time: 04/11/19 10:54 PM  Result Value Ref Range   Sodium 133 (L) 135 - 145 mmol/L   Potassium 4.2 3.5 - 5.1 mmol/L   Chloride 103 98 - 111 mmol/L   CO2 25 22 - 32 mmol/L   Glucose, Bld 138 (H) 70 - 99 mg/dL  BUN 10 6 - 20 mg/dL   Creatinine, Ser 4.09 0.44 - 1.00 mg/dL   Calcium 8.7 (L) 8.9 - 10.3 mg/dL   GFR calc non Af Amer >60 >60 mL/min   GFR calc Af Amer >60 >60 mL/min   Anion gap 5 5 - 15    Comment: Performed at [facility], [address] [place], [place] [address]  CBC with Differential     Status: Abnormal   Collection Time: 04/11/19 10:54 PM  Result Value Ref Range   WBC 10.6 (H) 4.0 - 10.5 K/uL   RBC 4.00 3.87 - 5.11 MIL/uL   Hemoglobin 11.7 (L) 12.0 - 15.0 g/dL   HCT 47.8 29.5 - 62.1 %   MCV 91.5 80.0 - 100.0 fL   MCH 29.3 26.0 - 34.0 pg   MCHC 32.0 30.0 - 36.0 g/dL   RDW 30.8 65.7 - 84.6 %   Platelets 309 150 - 400 K/uL   nRBC 0.0 0.0 - 0.2 %   Neutrophils Relative % 73 %   Neutro Abs 7.9 (H) 1.7 - 7.7 K/uL   Lymphocytes Relative 15 %   Lymphs Abs 1.6 0.7 - 4.0 K/uL   Monocytes Relative 7 %   Monocytes Absolute 0.7 0.1 - 1.0  K/uL   Eosinophils Relative 4 %   Eosinophils Absolute 0.4 0.0 - 0.5 K/uL   Basophils Relative 0 %   Basophils Absolute 0.0 0.0 - 0.1 K/uL   Immature Granulocytes 1 %   Abs Immature Granulocytes 0.05 0.00 - 0.07 K/uL    Comment: Performed at [facility], [address] [place], [place] [address]  Protime-INR     Status: Abnormal   Collection Time: 04/11/19 10:54 PM  Result Value Ref Range   Prothrombin Time 29.6 (H) 11.4 - 15.2 seconds   INR 2.9 (H) 0.8 - 1.2    Comment: (NOTE) INR goal varies based on device and disease states. Performed at [facility], [address], [place], [place] [address]   hCG, quantitative, pregnancy     Status: None   Collection Time: 04/11/19 10:54 PM  Result Value Ref Range   hCG, Beta Chain, Quant, S <1 <5 mIU/mL    Comment:          GEST. AGE      CONC.  (mIU/mL)   <=1 WEEK        5 - 50     2 WEEKS       50 - 500     3 WEEKS       100 - 10,000     4 WEEKS     1,000 - 30,000     5 WEEKS     3,500 - 115,000   6-8 WEEKS     12,000 - 270,000    12 WEEKS     15,000 - 220,000        FEMALE AND NON-PREGNANT FEMALE:     LESS THAN 5 mIU/mL Performed at [facility], [address] [place], [place] [address]   Lactic acid, plasma     Status: None   Collection Time: 04/12/19  3:16 AM  Result Value Ref Range   Lactic Acid, Venous 1.7 0.5 - 1.9 mmol/L    Comment: Performed at [facility], [address] [place], [place] [address]  Blood culture (routine x 2)     Status: None (Preliminary result)   Collection Time: 04/12/19  3:16 AM  Result Value Ref Range   Specimen Description BLOOD LEFT ARM    Special Requests      BOTTLES DRAWN AEROBIC AND ANAEROBIC Blood Culture adequate volume   Culture      NO GROWTH 1 DAY Performed at [facility], [address], [place], [place] [address]    Report Status PENDING   Blood culture (routine x 2)     Status: None (Preliminary result)   Collection Time: 04/12/19  3:16 AM   Result Value Ref Range   Specimen Description BLOOD RIGHT ARM    Special Requests      BOTTLES DRAWN AEROBIC ONLY Blood Culture results may not be optimal due to an excessive volume of blood received in culture bottles   Culture      NO GROWTH 1 DAY Performed at [facility], [address], [place], [place] [address]    Report Status PENDING   HIV antibody (Routine Testing)     Status: None   Collection Time: 04/12/19  5:49 AM  Result Value Ref Range   HIV Screen 4th Generation wRfx Non Reactive Non Reactive    Comment: (NOTE) Performed At: [facility] [address] [place], [place] [id number] Jolene Schimke MD MW:[id number]   Basic metabolic panel     Status: Abnormal   Collection Time: 04/12/19  5:49 AM  Result Value Ref Range   Sodium 135 135 - 145 mmol/L   Potassium 3.7 3.5 - 5.1 mmol/L   Chloride 102 98 - 111 mmol/L   CO2 23 22 - 32 mmol/L   Glucose, Bld 129 (H) 70 - 99 mg/dL   BUN 9 6 - 20 mg/dL   Creatinine, Ser 7.25 0.44 - 1.00 mg/dL   Calcium 8.6 (L) 8.9 - 10.3 mg/dL   GFR calc non Af Amer >60 >60 mL/min   GFR calc Af Amer >60 >60 mL/min   Anion gap 10 5 - 15    Comment: Performed at [facility], [address] [place], [place] [address]  CBC WITH DIFFERENTIAL     Status: Abnormal   Collection Time: 04/12/19  5:49 AM  Result Value Ref Range   WBC 9.8 4.0 - 10.5 K/uL   RBC 3.94 3.87 - 5.11 MIL/uL   Hemoglobin 11.3 (L) 12.0 - 15.0 g/dL   HCT 03.4 74.2 - 59.5 %   MCV 91.4 80.0 - 100.0 fL   MCH 28.7 26.0 - 34.0 pg   MCHC 31.4 30.0 - 36.0 g/dL   RDW 63.8 75.6 - 43.3 %   Platelets 305 150 - 400 K/uL   nRBC 0.0 0.0 - 0.2 %   Neutrophils Relative % 69 %   Neutro Abs 6.8 1.7 - 7.7 K/uL   Lymphocytes Relative 18 %   Lymphs Abs 1.7 0.7 - 4.0 K/uL   Monocytes Relative 7 %   Monocytes Absolute 0.7 0.1 - 1.0 K/uL   Eosinophils Relative 5 %   Eosinophils Absolute 0.4 0.0 - 0.5 K/uL   Basophils Relative 0 %   Basophils Absolute 0.0 0.0 - 0.1  K/uL   Immature Granulocytes 1 %   Abs Immature Granulocytes 0.06 0.00 - 0.07 K/uL    Comment: Performed at [facility], [address] [place], [place] [address]  Protime-INR     Status: Abnormal   Collection Time: 04/13/19  4:43 AM  Result Value Ref Range   Prothrombin Time 34.0 (H) 11.4 - 15.2 seconds  INR 3.4 (H) 0.8 - 1.2    Comment: (NOTE) INR goal varies based on device and disease states. Performed at [facility], [address], [place], [place] [address]   CBC     Status: Abnormal   Collection Time: 04/13/19  4:43 AM  Result Value Ref Range   WBC 7.8 4.0 - 10.5 K/uL   RBC 4.12 3.87 - 5.11 MIL/uL   Hemoglobin 11.7 (L) 12.0 - 15.0 g/dL   HCT 60.4 54.0 - 98.1 %   MCV 89.3 80.0 - 100.0 fL   MCH 28.4 26.0 - 34.0 pg   MCHC 31.8 30.0 - 36.0 g/dL   RDW 19.1 47.8 - 29.5 %   Platelets 311 150 - 400 K/uL   nRBC 0.0 0.0 - 0.2 %    Comment: Performed at [facility], [address] [place], [place] [address]  Basic metabolic panel     Status: Abnormal   Collection Time: 04/13/19  4:43 AM  Result Value Ref Range   Sodium 136 135 - 145 mmol/L   Potassium 3.6 3.5 - 5.1 mmol/L   Chloride 101 98 - 111 mmol/L   CO2 24 22 - 32 mmol/L   Glucose, Bld 122 (H) 70 - 99 mg/dL   BUN 9 6 - 20 mg/dL   Creatinine, Ser 8.65 0.44 - 1.00 mg/dL   Calcium 8.5 (L) 8.9 - 10.3 mg/dL   GFR calc non Af Amer >60 >60 mL/min   GFR calc Af Amer >60 >60 mL/min   Anion gap 11 5 - 15    Comment: Performed at [facility], [address] [place], [place] [address]  Glucose, capillary     Status: Abnormal   Collection Time: 04/13/19  7:48 AM  Result Value Ref Range   Glucose-Capillary 110 (H) 70 - 99 mg/dL  Glucose, capillary     Status: Abnormal   Collection Time: 04/13/19 11:23 AM  Result Value Ref Range   Glucose-Capillary 103 (H) 70 - 99 mg/dL    Ct Venogram Abd/pel  Result Date: 04/12/2019 CLINICAL DATA:  Right lower extremity swelling. EXAM: CT ABDOMEN AND PELVIS  WITH CONTRAST (CT VENOGRAM) TECHNIQUE: Multidetector CT imaging of the abdomen and pelvis was performed using the standard protocol following bolus administration of intravenous contrast. CONTRAST:  OMNIPAQUE IOHEXOL 300 MG/ML  SOLN COMPARISON:  05/08/2017 FINDINGS: Lower chest: 11 mm nodule partially imaged on the 1st image in the right lower lobe. Heart is normal size. No effusions. Hepatobiliary: Prior cholecystectomy. Again noted is a large exophytic cyst arising from the inferior liver, extending into the upper pelvis, measuring 19 x 17 cm compared with 18.5 x 16.3 cm previously. Pancreas: No focal abnormality or ductal dilatation. Spleen: No focal abnormality.  Normal size. Adrenals/Urinary Tract: No adrenal abnormality. No focal renal abnormality. No stones or hydronephrosis. Urinary bladder is unremarkable. Stomach/Bowel: Stomach, large and small bowel grossly unremarkable. Vascular/Lymphatic: Scattered aortic atherosclerosis. Aorta is normal caliber. The IVC is compressed by the large exophytic attic cyst. Infrarenal IVC is slit-like. The iliofemoral vessels appear patent although not well opacified and difficult to evaluate. The deep venous structures of the right lower extremity appear distended and indistinct, including the femoral vein, profundus femoris and popliteal vein. Findings concerning for deep venous thrombosis. Reproductive: Uterus and adnexa unremarkable. No mass. IUD in place. Other: No free fluid or free air. Musculoskeletal: No acute bony abnormality. Marked edema and swelling involving the visualized right lower extremity. IMPRESSION: Findings  concerning for DVT within the right femoral vein, profundus femoris and possibly popliteal vein. These vessels appear distended and indistinct. Recommend confirmation with right lower extremity venous ultrasound. Large exophytic cyst arising from the inferior liver extending into the upper pelvis measuring up to 19 cm. This is slightly larger  when compared to 2018 study when this measured up to 18.4 cm. This causes mass effect and compression of the IVC. Prior cholecystectomy. Electronically Signed   By: Charlett Nose M.D.   On: 04/12/2019 03:06    Review of Systems  Constitutional: Negative.   HENT:       Has lost some teeth.  Eyes: Negative.   Respiratory: Negative.   Cardiovascular: Positive for leg swelling.  Gastrointestinal: Negative.   Genitourinary: Negative.   Musculoskeletal: Negative.   Skin:       Chronic skin issues  Neurological: Negative.   Endo/Heme/Allergies: Negative.   Psychiatric/Behavioral: Negative.       Blood pressure 133/69, pulse 85, temperature 98 F (36.7 C), resp. rate 14, height 5\' 4"  (1.626 m), weight (!) 222 kg, SpO2 99 %.   Physical Exam  Constitutional: She is oriented to person, place, and time. She appears well-developed and well-nourished. No distress.  HENT:  Head: Normocephalic and atraumatic.  Right Ear: External ear normal.  Left Ear: External ear normal.  Mouth/Throat: Oropharynx is clear and moist.  Missing multiple teeth.  Eyes: Pupils are equal, round, and reactive to light. Conjunctivae are normal. Right eye exhibits no discharge. Left eye exhibits no discharge. No scleral icterus.  Neck: Normal range of motion. Neck supple. No tracheal deviation present. No thyromegaly present.  Cardiovascular: Normal rate, regular rhythm, normal heart sounds and intact distal pulses. Exam reveals no gallop and no friction rub.  No murmur heard. Respiratory: Effort normal. No respiratory distress.  GI: Soft. Bowel sounds are normal. She exhibits distension (mild). She exhibits no mass. There is no abdominal tenderness. There is no rebound and no guarding.  No hepatosplenomegaly.  Musculoskeletal: Normal range of motion.        General: Edema (right lower extremity pain and swelling.) present.  Lymphadenopathy:    She has no cervical adenopathy.  Neurological: She is alert and  oriented to person, place, and time.  Skin: Skin is warm and dry. Rash (chronic skin changes on bilateral lower extremities.  ) noted. She is not diaphoretic. No erythema. No pallor.  Psychiatric: She has a normal mood and affect. Her behavior is normal. Judgment and thought content normal.    Assessment/Plan:  Large hepatic cyst Right DVT 03/2019 Bilat PE 03/2019 Extrinsic compression of IVC Morbid obesity with BMI >70. Anticoagulated with warfarin  Typically, hepatic cysts do not cause compression of the vasculature, but in this patient's case, I think the weight of the abdominal wall is contributing to the IVC compression. In reviewing past imaging with radiology, she had compression of the IVC going back on imaging in May 2018.  This is quite a large cyst.     I would plan laparoscopic cyst unroofing, but would wait at least a 3 month interval post PE to minimize risk of recurrent PE. Especially I would not rush to the OR given that the compression was present for several years.   We will need to hold warfarin pre op and bridge with lovenox.  This will also allow her time to get her mobility back in order to decrease risk of VTE post op.    I will order outpatient MRI  and see the patient back in the office.      Almond Lint 04/13/2019, 12:29 PM

## 2019-04-14 DIAGNOSIS — I10 Essential (primary) hypertension: Secondary | ICD-10-CM | POA: Diagnosis not present

## 2019-04-14 DIAGNOSIS — I890 Lymphedema, not elsewhere classified: Secondary | ICD-10-CM | POA: Diagnosis not present

## 2019-04-14 DIAGNOSIS — K7689 Other specified diseases of liver: Secondary | ICD-10-CM | POA: Diagnosis not present

## 2019-04-14 DIAGNOSIS — M7989 Other specified soft tissue disorders: Secondary | ICD-10-CM | POA: Diagnosis not present

## 2019-04-14 LAB — BASIC METABOLIC PANEL
Anion gap: 11 (ref 5–15)
BUN: 10 mg/dL (ref 6–20)
CO2: 23 mmol/L (ref 22–32)
Calcium: 8.6 mg/dL — ABNORMAL LOW (ref 8.9–10.3)
Chloride: 102 mmol/L (ref 98–111)
Creatinine, Ser: 0.68 mg/dL (ref 0.44–1.00)
GFR calc Af Amer: >60 mL/min (ref >60)
GFR calc non Af Amer: >60 mL/min (ref >60)
Glucose, Bld: 116 mg/dL — ABNORMAL HIGH (ref 70–99)
Potassium: 3.8 mmol/L (ref 3.5–5.1)
Sodium: 136 mmol/L (ref 135–145)

## 2019-04-14 LAB — CBC
HCT: 35.5 % — ABNORMAL LOW (ref 36.0–46.0)
Hemoglobin: 11.6 g/dL — ABNORMAL LOW (ref 12.0–15.0)
MCH: 29.1 pg (ref 26.0–34.0)
MCHC: 32.7 g/dL (ref 30.0–36.0)
MCV: 89 fL (ref 80.0–100.0)
Platelets: 326 10*3/uL (ref 150–400)
RBC: 3.99 MIL/uL (ref 3.87–5.11)
RDW: 14.1 % (ref 11.5–15.5)
WBC: 8.1 10*3/uL (ref 4.0–10.5)
nRBC: 0 % (ref 0.0–0.2)

## 2019-04-14 LAB — PROTIME-INR
INR: 2.9 — ABNORMAL HIGH (ref 0.8–1.2)
Prothrombin Time: 30.1 seconds — ABNORMAL HIGH (ref 11.4–15.2)

## 2019-04-14 MED ORDER — WARFARIN SODIUM 5 MG PO TABS: 5.0000 mg | ORAL_TABLET | Freq: Every day | ORAL | Status: DC

## 2019-04-14 MED ORDER — OXYCODONE HCL 5 MG PO TABS: 5.0000 mg | ORAL_TABLET | ORAL | 0 refills | Status: AC | PRN

## 2019-04-14 MED ORDER — ACETAMINOPHEN 325 MG PO TABS: 650.0000 mg | ORAL_TABLET | Freq: Four times a day (QID) | ORAL | 0 refills | Status: DC | PRN

## 2019-04-14 MED ORDER — WARFARIN SODIUM 5 MG PO TABS: 5.0000 mg | ORAL_TABLET | Freq: Once | ORAL | 1 refills | Status: DC

## 2019-04-14 MED ORDER — POLYETHYLENE GLYCOL 3350 17 G PO PACK: 17.0000 g | PACK | Freq: Every day | ORAL | 1 refills | Status: DC | PRN

## 2019-04-14 MED ORDER — PANTOPRAZOLE SODIUM 40 MG PO TBEC: 40.0000 mg | DELAYED_RELEASE_TABLET | Freq: Every day | ORAL | 1 refills | Status: DC

## 2019-04-14 MED ORDER — DOCUSATE SODIUM 100 MG PO CAPS: 100.0000 mg | ORAL_CAPSULE | Freq: Two times a day (BID) | ORAL | 2 refills | Status: DC | PRN

## 2019-04-14 NOTE — Progress Notes (Signed)
Susan Warren to be D/C'd Home per MD order.  Discussed with the patient and all questions fully answered.  VSS, Skin clean, dry and intact without evidence of skin break down, no evidence of skin tears noted. IV catheter discontinued intact. Site without signs and symptoms of complications. Dressing and pressure applied.  An After Visit Summary was printed and given to the patient. Patient received prescription.  D/c education completed with patient/family including follow up instructions, medication list, d/c activities limitations if indicated, with other d/c instructions as indicated by MD - patient able to verbalize understanding, all questions fully answered.   Patient instructed to return to ED, call 911, or call MD for any changes in condition.   Patient escorted via WC, and D/C home via private auto.  [name] 04/14/2019 1:41 PM

## 2019-04-14 NOTE — Progress Notes (Addendum)
Physical Therapy Treatment & Discharge Patient Details Name: Susan Warren MRN: [id number] DOB: 1976/04/10 Today's Date: 04/14/2019    History of Present Illness Pt is a 43 y.o. female admitted 04/11/19 with BLE pain and swelling. CT venogram with DVT involving R femoral, profundus femoris, and possibly popliteal vein; there is also interval enlargement in liver cyst that compresses IVC and may also be contributing to her swelling. Per vascular no candidate for IVC filter as high risk to thrombose. Of note, recent admission 03/29/2019 with acute massive bilateral PE d/c on Warfarin. PMH includes HTN, hepatic cyst, depression, morbid obesity (BMI 84).   PT Comments    Pt progressing well despite no change in RLE pain. Indep with ambulation and ADLs in room. Plans for d/c this afternoon; will have necessary assist from family. Discussed importance of continued, frequent mobility and fall risk reduction. Pt has no further questions or concerns. Will d/c acute PT.    Follow Up Recommendations  No PT follow up;Supervision - Intermittent     Equipment Recommendations  None recommended by PT    Recommendations for Other Services       Precautions / Restrictions Precautions Precautions: Fall Precaution Comments: RLE DVT Restrictions Weight Bearing Restrictions: No    Mobility  Bed Mobility               General bed mobility comments: Received in bathroom with NT. Educ on use of towel/sheet to assist RLE into/out of bed; pt reports bed at home will allow easier RLE movement  Transfers Overall transfer level: Independent   Transfers: Sit to/from Stand              Ambulation/Gait Ambulation/Gait assistance: Independent Gait Distance (Feet): 30 Feet Assistive device: None Gait Pattern/deviations: Step-through pattern;Decreased stride length;Wide base of support;Antalgic Gait velocity: Decreased   General Gait Details: Indep with ambulation throughout room without device  or UE support; reports no change in RLE pain   Stairs Stairs: (Declined stair training. Reports confidence ascending 4 steps into home with rail support; educ on technique due to RLE pain)           Wheelchair Mobility    Modified Rankin (Stroke Patients Only)       Balance Overall balance assessment: Needs assistance   Sitting balance-Leahy Scale: Good       Standing balance-Leahy Scale: Good Standing balance comment: Indep standing to shower in bathroom                            Cognition Arousal/Alertness: Awake/alert Behavior During Therapy: WFL for tasks assessed/performed Overall Cognitive Status: Within Functional Limits for tasks assessed                                        Exercises      General Comments        Pertinent Vitals/Pain Pain Assessment: Faces Faces Pain Scale: Hurts little more Pain Location: R upper/medial thigh Pain Descriptors / Indicators: Constant;Discomfort Pain Intervention(s): Limited activity within patient's tolerance    Home Living                      Prior Function            PT Goals (current goals can now be found in the care plan section) Acute Rehab PT Goals Patient  Stated Goal: Less leg pain PT Goal Formulation: With patient Time For Goal Achievement: 04/27/19 Potential to Achieve Goals: Good Progress towards PT goals: Progressing toward goals    Frequency    Min 3X/week      PT Plan Current plan remains appropriate    Co-evaluation              AM-PAC PT "6 Clicks" Mobility   Outcome Measure  Help needed turning from your back to your side while in a flat bed without using bedrails?: A Little Help needed moving from lying on your back to sitting on the side of a flat bed without using bedrails?: A Little Help needed moving to and from a bed to a chair (including a wheelchair)?: None Help needed standing up from a chair using your arms (e.g.,  wheelchair or bedside chair)?: None Help needed to walk in hospital room?: None Help needed climbing 3-5 steps with a railing? : A Little 6 Click Score: 21    End of Session   Activity Tolerance: Patient tolerated treatment well Patient left: in bed;with call bell/phone within reach(seated EOB) Nurse Communication: Mobility status PT Visit Diagnosis: Other abnormalities of gait and mobility (R26.89);Pain Pain - Right/Left: Right Pain - part of body: Leg     Time: 1141-1151 PT Time Calculation (min) (ACUTE ONLY): 10 min  Charges:  $Self Care/Home Management: 8-22                    Susan HomesJaclyn Derrich Gaby, PT, DPT Acute Rehabilitation Services  Pager [phone] Office [phone]  Susan ChamberJaclyn L Fielding Mault 04/14/2019, 1:04 PM

## 2019-04-14 NOTE — Progress Notes (Signed)
ANTICOAGULATION CONSULT NOTE Pharmacy Consult for Coumadin Indication: DVT  Allergies  Allergen Reactions  . Adhesive [Tape] Anaphylaxis and Swelling    *Adhesive Spray*     Patient Measurements: Height: 5\' 4"  (162.6 cm) Weight: (!) 489 lb 6.7 oz (222 kg) IBW/kg (Calculated) : 54.7   Vital Signs: Temp: 98 F (36.7 C) (04/22 0727) Temp Source: Oral (04/22 0727) BP: 155/79 (04/22 0727) Pulse Rate: 83 (04/22 0727)  Labs: Recent Labs    04/11/19 2254 04/12/19 0549 04/13/19 0443 04/14/19 0428  HGB 11.7* 11.3* 11.7* 11.6*  HCT 36.6 36.0 36.8 35.5*  PLT 309 305 311 326  LABPROT 29.6*  --  34.0* 30.1*  INR 2.9*  --  3.4* 2.9*  CREATININE 0.78 0.66 0.75 0.68    Estimated Creatinine Clearance: 170.5 mL/min (by C-G formula based on SCr of 0.68 mg/dL).   Medical History: Past Medical History:  Diagnosis Date  . Allergy   . Anxiety   . Depression   . DVT (deep venous thrombosis) (HCC)   . GERD (gastroesophageal reflux disease)   . Hepatic cyst   . Hypertension   . Lymphedema of both lower extremities   . Migraines   . Morbid obesity with BMI of 70 and over, adult (HCC)   . PE (pulmonary thromboembolism) (HCC) 03/2019  . Sleep apnea       Assessment: 43 yo lady with h/o PE to continue coumadin.  INR on admission 2.9 INR today 2.9  Goal of Therapy:  INR 2-3 Monitor platelets by anticoagulation protocol: Yes   Plan:  Restart home dose of Warfarin 5 mg po daily Daily PT/INR  Thanks for allowing pharmacy to be a part of this patient's care. Okey Regal, PharmD [phone]  04/14/2019,9:55 AM

## 2019-04-14 NOTE — Discharge Summary (Signed)
Physician Discharge Summary  Susan Warren ZOX:[id number] DOB: 01-25-1976 DOA: 04/11/2019  PCP: Storm Frisk, MD  Admit date: 04/11/2019 Discharge date: 04/14/2019  Time spent: 35 minutes  Recommendations for Outpatient Follow-up:  1. Repeat basic metabolic panel to follow electrolytes and renal function 2. Repeat CBC to follow hemoglobin trend 3. Follow INR and further adjust Coumadin dose as needed 4. Patient will benefit of outpatient referral to bariatric clinic   Discharge Diagnoses:  Principal Problem:   Right leg swelling Active Problems:   Hypertension   PE (pulmonary thromboembolism) (HCC)   Right leg DVT (HCC)   Hepatic cyst   Lymphedema of both lower extremities   Obesity, Class III, BMI 40-49.9 (morbid obesity) (HCC)   Discharge Condition: Stable and improved.  Patient discharged home with instruction to follow-up with PCP and general surgery as an outpatient.  Diet recommendation: low calorie/healthy diet   Filed Weights   04/11/19 2212 04/12/19 0708  Weight: (!) 208.7 kg (!) 222 kg    History of present illness:  As per H&P written by Dr. Odie Sera on 04/12/2019 43 y.o. female with medical history significant for obesity (BMI 79), hypertension, and PE on warfarin, now presenting to the emergency department for evaluation of progressive pain and swelling involving the right leg.  Patient reports that the right leg pain and swelling began approximately 2 weeks ago when she was in the hospital and being managed for massive bilateral PE.  Pain has slowly progressed since then and she has also developed progressive edema.  Pain is mainly in the proximal and anteromedial aspect of the leg.  She reports that her chronic left leg swelling is at baseline.  She denies any fevers or chills, denies any cough, shortness of breath, or chest pain, and denies any melena or hematochezia.  She has not noticed much redness involving the right leg and has not noted any wounds or  boils or drainage.  ED Course: Upon arrival to the ED, patient is found to be saturating well on room air and with other vitals also stable.  Chemistry panel notable for mild chronic hyponatremia and CBC features a slight leukocytosis.  Lactic acid is reassuringly normal and INR is therapeutic at 2.9.  CT venogram was performed with concern for right femoral vein, profundus femoris vein, and possibly right popliteal vein DVT, as well as a large exophytic cyst arising from the inferior liver and extending into the upper pelvis causing mass-effect and compression of the IVC.  Vascular surgery was consulted by the ED physician and recommended a medical admission.  Hospital Course:  1-right leg swelling and pain: due to DVT (involving femoral, profundus femoris and popliteal vein). -Patient evaluated by vascular surgery who at this moment recommending no thrombolyzes or placement of IVC filter. -Continue anticoagulation -Drastic weight loss -Outpatient follow-up with general surgery (to assist with ongoing liver cyst and by that hopefully overall improvement in venous flow) -INR is therapeutic   2-PE -stable -no SOB or CP -good O2 sat on RA -continue anticoagulation.  3-HTN -continue norvasc -heart healthy diet recommended  4-hyponatremia -resolved with IVF's resuscitation -patient advise to maintain adequate hydration  5-hepatic cyst/occlusion IVC -continue anticoagulation -outpatient follow up with general surgery  6-morbid obesity -Body mass index is 84.01 kg/m. -lifestyle changes discussed -patient will benefit of aggressive weight loss. -outpatient follow up with bariatric clinic recommended   Procedures:  See below for x-ray reports.  Consultations:  Vascular surgery  General surgery  Discharge Exam: Vitals:  04/13/19 2114 04/14/19 0727  BP: (!) 159/74 (!) 155/79  Pulse: 84 83  Resp: 18 18  Temp: 98.4 F (36.9 C) 98 F (36.7 C)  SpO2: 98% 99%     General: Afebrile, no chest pain, no shortness of breath, good oxygen saturation on room air.  Patient denies nausea or vomiting.  Still having significant discomfort in her right lower extremity and complaining of swelling in her legs.  Able to ambulate without difficulties and physical therapy no recommending any equipment or needs for further follow up currently. Cardiovascular: S1-S2, no rubs, no gallops, unable to assess JVD due to body habitus. Respiratory: Clear to auscultation bilaterally, normal respiratory effort. Abdomen: Obese, soft, nontender, positive bowel sounds Extremities: Bilateral lower extremity lymphedema appreciated (right more than left), no cyanosis, no redness, pain to palpation in her right leg (especially on her thighs and upper calf area).  Discharge Instructions   Discharge Instructions    Diet - low sodium heart healthy   Complete by:  As directed    Discharge instructions   Complete by:  As directed    Take medications as prescribed Maintain adequate hydration Follow-up as previously instructed to establish care with primary care doctor Follow-up as instructed at coumadin clinic, to assess INR and further adjust your coumadin as needed.     Allergies as of 04/14/2019      Reactions   Adhesive [tape] Anaphylaxis, Swelling   *Adhesive Spray*       Medication List    TAKE these medications   acetaminophen 325 MG tablet Commonly known as:  TYLENOL Take 2 tablets (650 mg total) by mouth every 6 (six) hours as needed for mild pain or headache (or Fever >/= 101).   amLODipine 5 MG tablet Commonly known as:  NORVASC Take 1 tablet (5 mg total) by mouth daily.   docusate sodium 100 MG capsule Commonly known as:  Colace Take 1 capsule (100 mg total) by mouth 2 (two) times daily as needed for moderate constipation.   oxyCODONE 5 MG immediate release tablet Commonly known as:  Roxicodone Take 1 tablet (5 mg total) by mouth every 4 (four) hours as  needed for up to 5 days for severe pain.   pantoprazole 40 MG tablet Commonly known as:  Protonix Take 1 tablet (40 mg total) by mouth daily.   polyethylene glycol 17 g packet Commonly known as:  MIRALAX / GLYCOLAX Take 17 g by mouth daily as needed for mild constipation.   warfarin 5 MG tablet Commonly known as:  COUMADIN Take 1 tablet (5 mg total) by mouth one time only at 6 PM for 30 days.      Allergies  Allergen Reactions  . Adhesive [Tape] Anaphylaxis and Swelling    *Adhesive Spray*    Follow-up Information    Almond Lint, MD Follow up in 2 month(s).   Specialty:  General Surgery Contact information: [address] [place] [address] [phone]        Storm Frisk, MD. Go today.   Specialty:  Pulmonary Disease Why:  as previously instructed/schedule  Contact information: [address] [place] [address] [phone]           The results of significant diagnostics from this hospitalization (including imaging, microbiology, ancillary and laboratory) are listed below for reference.    Significant Diagnostic Studies: Ct Angio Chest Pe W/cm &/or Wo Cm  Result Date: 03/29/2019 CLINICAL DATA:  Shortness of breath. EXAM: CT ANGIOGRAPHY  CHEST WITH CONTRAST TECHNIQUE: Multidetector CT imaging of the chest was performed using the standard protocol during bolus administration of intravenous contrast. Multiplanar CT image reconstructions and MIPs were obtained to evaluate the vascular anatomy. CONTRAST:  OMNIPAQUE IOHEXOL 350 MG/ML SOLN COMPARISON:  Chest x-ray dated 03/29/2019 and CT scan dated 02/05/2017 FINDINGS: Cardiovascular: There are massive bilateral pulmonary emboli. There is almost complete obstruction of the pulmonary arteries in the left lung with a tiny amount of flow into the left upper lobe. There is only minimal flow into the pulmonary arteries of the right lung due to the massive emboli. RV/LV ratio is 2.6 indicating  marked right heart strain. Mediastinum/Nodes: No enlarged mediastinal, hilar, or axillary lymph nodes. Thyroid gland, trachea, and esophagus demonstrate no significant findings. Lungs/Pleura: Lungs are clear. No pleural effusion or pneumothorax. Upper Abdomen: Normal. Musculoskeletal: No chest wall abnormality. No acute or significant osseous findings. Review of the MIP images confirms the above findings. IMPRESSION: 1. Massive bilateral pulmonary emboli with minimal perfusion to both lungs. 2. Markedly abnormal RV/LV ratio of 2.6. Critical Value/emergent results were called by telephone at the time of interpretation on 03/29/2019 at 12:30 pm to [facility], PA-C , who verbally acknowledged these results. Electronically Signed   By: Francene Boyers M.D.   On: 03/29/2019 12:32   Ir US Guide Vasc Access Right  Result Date: 03/29/2019 INDICATION: Acute massive bilateral pulmonary emboli with significant right heart strain. EXAM: 1. ULTRASOUND GUIDANCE FOR VENOUS ACCESS X2 2. BILATERAL PULMONARY ARTERIOGRAPHY 3. FLUOROSCOPIC GUIDED PLACEMENT OF BILATERAL PULMONARY ARTERIAL LYTIC INFUSION CATHETERS COMPARISON:  CTA 03/29/2019 MEDICATIONS: Intravenous Fentanyl and Versed 1mg  were administered as conscious sedation during continuous monitoring of the patient's level of consciousness and physiological / cardiorespiratory status by the radiology RN, with a total moderate sedation time of 20 minutes. CONTRAST:  None FLUOROSCOPY TIME:  4 minutes 30 seconds; 65 mGy COMPLICATIONS: None immediate TECHNIQUE: Informed written consent was obtained from the patient after a discussion of the risks, benefits and alternatives to treatment. Questions regarding the procedure were encouraged and answered. A timeout was performed prior to the initiation of the procedure. Ultrasound scanning was performed demonstrating patency of the right internal jugular vein which was selected for vascular access. The right neck was prepped and  draped in the usual sterile fashion, and a sterile drape was applied covering the operative field. Maximum barrier sterile technique with sterile gowns and gloves were used for the procedure. A timeout was performed prior to the initiation of the procedure. Local anesthesia was provided with 1% lidocaine. Under direct ultrasound guidance, the right internal jugular vein was accessed with a micro puncture sheath ultimately allowing placement of a 6 French vascular sheath. Slightly cranial to this initial access, the right internal jugular vein was again accessed with a micropuncture sheath ultimately allowing placement of a 6 French vascular sheath. With the use of a Benson wire, a 5 French angled pigtail catheter was advanced into the right main pulmonary artery. Over an exchange length Rosen wire, the pigtail catheter was exchanged for a 105/18 cm multi side-hole EKOS ultrasound assisted infusion catheter. With the use of a Bentson wire, via the second sheath a 5 French pigtail catheter was advanced into the left main pulmonary artery . Over an exchange length Rosen wire, the pigtail catheter was exchanged for a 105/12 cm multi side-hole EKOS ultrasound assisted infusion catheter. A postprocedural fluoroscopic image was obtained of the check demonstrating final catheter positioning. Following the procedure, both ultrasound  assisted infusion catheter tips terminate within the distal aspects of the bilateral lower lobe sub segmental pulmonary arteries. Both vascular sheath were secured at the right neck with interrupted 0 Prolene sutures. The external catheter tubing was secured at the right chest and the lytic therapy was initiated. The patient tolerated the procedure well without immediate postprocedural complication. IMPRESSION: 1. Successful fluoroscopic guided initiation of bilateral ultrasound assisted catheter directed pulmonary arterial lysis for acute bilateral massive pulmonary embolism and right-sided  heart strain. PLAN: -ICU observation during infusion -After at least 12 hours infusion, obtain pressure measurements and either removal of the catheters or continuation of the catheter directed thrombolysis. -consider lower extremity Doppler to exclude significant residual DVT Electronically Signed   By: Corlis Leak  Hassell M.D.   On: 03/29/2019 16:28   Dg Chest Port 1 View  Result Date: 03/31/2019 CLINICAL DATA:  Shortness of Breath EXAM: PORTABLE CHEST 1 VIEW COMPARISON:  03/29/2019 FINDINGS: Cardiac shadow is stable. The lungs are well aerated bilaterally. No focal infiltrate or sizable effusion is seen. No acute bony abnormality is noted. IMPRESSION: No active disease. Electronically Signed   By: Alcide CleverMark  Lukens M.D.   On: 03/31/2019 08:22   Dg Chest Portable 1 View  Result Date: 03/29/2019 CLINICAL DATA:  Onset shortness of breath and tachycardia 3 hours ago. EXAM: PORTABLE CHEST 1 VIEW COMPARISON:  CT chest and PA and lateral chest 02/05/2017. FINDINGS: The lungs are clear. Heart size is normal. No pneumothorax or pleural fluid. No acute or focal bony abnormality. IMPRESSION: Negative chest. Electronically Signed   By: Drusilla Kannerhomas  Dalessio M.D.   On: 03/29/2019 10:32   Ct Venogram Abd/pel  Result Date: 04/12/2019 CLINICAL DATA:  Right lower extremity swelling. EXAM: CT ABDOMEN AND PELVIS WITH CONTRAST (CT VENOGRAM) TECHNIQUE: Multidetector CT imaging of the abdomen and pelvis was performed using the standard protocol following bolus administration of intravenous contrast. CONTRAST:  125mL OMNIPAQUE IOHEXOL 300 MG/ML  SOLN COMPARISON:  05/08/2017 FINDINGS: Lower chest: 11 mm nodule partially imaged on the 1st image in the right lower lobe. Heart is normal size. No effusions. Hepatobiliary: Prior cholecystectomy. Again noted is a large exophytic cyst arising from the inferior liver, extending into the upper pelvis, measuring 19 x 17 cm compared with 18.5 x 16.3 cm previously. Pancreas: No focal abnormality or ductal  dilatation. Spleen: No focal abnormality.  Normal size. Adrenals/Urinary Tract: No adrenal abnormality. No focal renal abnormality. No stones or hydronephrosis. Urinary bladder is unremarkable. Stomach/Bowel: Stomach, large and small bowel grossly unremarkable. Vascular/Lymphatic: Scattered aortic atherosclerosis. Aorta is normal caliber. The IVC is compressed by the large exophytic attic cyst. Infrarenal IVC is slit-like. The iliofemoral vessels appear patent although not well opacified and difficult to evaluate. The deep venous structures of the right lower extremity appear distended and indistinct, including the femoral vein, profundus femoris and popliteal vein. Findings concerning for deep venous thrombosis. Reproductive: Uterus and adnexa unremarkable. No mass. IUD in place. Other: No free fluid or free air. Musculoskeletal: No acute bony abnormality. Marked edema and swelling involving the visualized right lower extremity. IMPRESSION: Findings concerning for DVT within the right femoral vein, profundus femoris and possibly popliteal vein. These vessels appear distended and indistinct. Recommend confirmation with right lower extremity venous ultrasound. Large exophytic cyst arising from the inferior liver extending into the upper pelvis measuring up to 19 cm. This is slightly larger when compared to 2018 study when this measured up to 18.4 cm. This causes mass effect and compression of the IVC. Prior cholecystectomy.  Electronically Signed   By: Charlett Nose M.D.   On: 04/12/2019 03:06   Vas [place] Lower Extremity Venous (dvt)  Result Date: 03/30/2019  Lower Venous Study Indications: Pulmonary embolism.  Limitations: Body habitus and poor ultrasound/tissue interface. Performing Technologist: Chanda Busing RVT  Examination Guidelines: A complete evaluation includes B-mode imaging, spectral Doppler, color Doppler, and power Doppler as needed of all accessible portions of each vessel. Bilateral testing is  considered an integral part of a complete examination. Limited examinations for reoccurring indications may be performed as noted.  Right Venous Findings: +---------+---------------+---------+-----------+----------+--------------+          CompressibilityPhasicitySpontaneityPropertiesSummary        +---------+---------------+---------+-----------+----------+--------------+ CFV      Full           No       No                                  +---------+---------------+---------+-----------+----------+--------------+ SFJ      Full                                                        +---------+---------------+---------+-----------+----------+--------------+ FV Prox  Full                                                        +---------+---------------+---------+-----------+----------+--------------+ FV Mid                                                Not visualized +---------+---------------+---------+-----------+----------+--------------+ FV Distal                                             Not visualized +---------+---------------+---------+-----------+----------+--------------+ PFV                                                   Not visualized +---------+---------------+---------+-----------+----------+--------------+ POP                                                   Not visualized +---------+---------------+---------+-----------+----------+--------------+ PTV                                                   Not visualized +---------+---------------+---------+-----------+----------+--------------+ PERO  Not visualized +---------+---------------+---------+-----------+----------+--------------+  Left Venous Findings: +---------+---------------+---------+-----------+----------+--------------+          CompressibilityPhasicitySpontaneityPropertiesSummary         +---------+---------------+---------+-----------+----------+--------------+ CFV      Full           Yes      Yes                                 +---------+---------------+---------+-----------+----------+--------------+ SFJ      Full                                                        +---------+---------------+---------+-----------+----------+--------------+ FV Prox  Full                                                        +---------+---------------+---------+-----------+----------+--------------+ FV Mid   Full                                                        +---------+---------------+---------+-----------+----------+--------------+ FV Distal                                             Not visualized +---------+---------------+---------+-----------+----------+--------------+ PFV                                                   Not visualized +---------+---------------+---------+-----------+----------+--------------+ POP      Full           Yes      No                                  +---------+---------------+---------+-----------+----------+--------------+ PTV                                                   Not visualized +---------+---------------+---------+-----------+----------+--------------+ PERO                                                  Not visualized +---------+---------------+---------+-----------+----------+--------------+    Summary: Right: There is no evidence of deep vein thrombosis in the lower extremity. However, portions of this examination were limited- see technologist comments above. No cystic structure found in the popliteal fossa. Left: There is no evidence of deep vein thrombosis in the lower extremity. However, portions of this examination were limited- see technologist comments above. No cystic structure found  in the popliteal fossa.  *See table(s) above for measurements and observations. Electronically  signed by Waverly Ferrari MD on 03/30/2019 at 5:18:24 PM.    Final    Ir Infusion Thrombol Arterial Initial (ms)  Result Date: 03/29/2019 INDICATION: Acute massive bilateral pulmonary emboli with significant right heart strain. EXAM: 1. ULTRASOUND GUIDANCE FOR VENOUS ACCESS X2 2. BILATERAL PULMONARY ARTERIOGRAPHY 3. FLUOROSCOPIC GUIDED PLACEMENT OF BILATERAL PULMONARY ARTERIAL LYTIC INFUSION CATHETERS COMPARISON:  CTA 03/29/2019 MEDICATIONS: Intravenous Fentanyl and Versed  were administered as conscious sedation during continuous monitoring of the patient's level of consciousness and physiological / cardiorespiratory status by the radiology RN, with a total moderate sedation time of 20 minutes. CONTRAST:  None FLUOROSCOPY TIME:  4 minutes 30 seconds; 65 mGy COMPLICATIONS: None immediate TECHNIQUE: Informed written consent was obtained from the patient after a discussion of the risks, benefits and alternatives to treatment. Questions regarding the procedure were encouraged and answered. A timeout was performed prior to the initiation of the procedure. Ultrasound scanning was performed demonstrating patency of the right internal jugular vein which was selected for vascular access. The right neck was prepped and draped in the usual sterile fashion, and a sterile drape was applied covering the operative field. Maximum barrier sterile technique with sterile gowns and gloves were used for the procedure. A timeout was performed prior to the initiation of the procedure. Local anesthesia was provided with 1% lidocaine. Under direct ultrasound guidance, the right internal jugular vein was accessed with a micro puncture sheath ultimately allowing placement of a 6 French vascular sheath. Slightly cranial to this initial access, the right internal jugular vein was again accessed with a micropuncture sheath ultimately allowing placement of a 6 French vascular sheath. With the use of a Benson wire, a 5 French angled  pigtail catheter was advanced into the right main pulmonary artery. Over an exchange length Rosen wire, the pigtail catheter was exchanged for a 105/18 cm multi side-hole EKOS ultrasound assisted infusion catheter. With the use of a Bentson wire, via the second sheath a 5 French pigtail catheter was advanced into the left main pulmonary artery . Over an exchange length Rosen wire, the pigtail catheter was exchanged for a 105/12 cm multi side-hole EKOS ultrasound assisted infusion catheter. A postprocedural fluoroscopic image was obtained of the check demonstrating final catheter positioning. Following the procedure, both ultrasound assisted infusion catheter tips terminate within the distal aspects of the bilateral lower lobe sub segmental pulmonary arteries. Both vascular sheath were secured at the right neck with interrupted 0 Prolene sutures. The external catheter tubing was secured at the right chest and the lytic therapy was initiated. The patient tolerated the procedure well without immediate postprocedural complication. IMPRESSION: 1. Successful fluoroscopic guided initiation of bilateral ultrasound assisted catheter directed pulmonary arterial lysis for acute bilateral massive pulmonary embolism and right-sided heart strain. PLAN: -ICU observation during infusion -After at least 12 hours infusion, obtain pressure measurements and either removal of the catheters or continuation of the catheter directed thrombolysis. -consider lower extremity Doppler to exclude significant residual DVT Electronically Signed   By: Corlis Leak M.D.   On: 03/29/2019 16:28   Ir Infusion Thrombol Arterial Initial (ms)  Result Date: 03/29/2019 INDICATION: Acute massive bilateral pulmonary emboli with significant right heart strain. EXAM: 1. ULTRASOUND GUIDANCE FOR VENOUS ACCESS X2 2. BILATERAL PULMONARY ARTERIOGRAPHY 3. FLUOROSCOPIC GUIDED PLACEMENT OF BILATERAL PULMONARY ARTERIAL LYTIC INFUSION CATHETERS COMPARISON:  CTA  03/29/2019 MEDICATIONS: Intravenous Fentanyl and Versed  were administered as conscious  sedation during continuous monitoring of the patient's level of consciousness and physiological / cardiorespiratory status by the radiology RN, with a total moderate sedation time of 20 minutes. CONTRAST:  None FLUOROSCOPY TIME:  4 minutes 30 seconds; 65 mGy COMPLICATIONS: None immediate TECHNIQUE: Informed written consent was obtained from the patient after a discussion of the risks, benefits and alternatives to treatment. Questions regarding the procedure were encouraged and answered. A timeout was performed prior to the initiation of the procedure. Ultrasound scanning was performed demonstrating patency of the right internal jugular vein which was selected for vascular access. The right neck was prepped and draped in the usual sterile fashion, and a sterile drape was applied covering the operative field. Maximum barrier sterile technique with sterile gowns and gloves were used for the procedure. A timeout was performed prior to the initiation of the procedure. Local anesthesia was provided with 1% lidocaine. Under direct ultrasound guidance, the right internal jugular vein was accessed with a micro puncture sheath ultimately allowing placement of a 6 French vascular sheath. Slightly cranial to this initial access, the right internal jugular vein was again accessed with a micropuncture sheath ultimately allowing placement of a 6 French vascular sheath. With the use of a Benson wire, a 5 French angled pigtail catheter was advanced into the right main pulmonary artery. Over an exchange length Rosen wire, the pigtail catheter was exchanged for a 105/18 cm multi side-hole EKOS ultrasound assisted infusion catheter. With the use of a Bentson wire, via the second sheath a 5 French pigtail catheter was advanced into the left main pulmonary artery . Over an exchange length Rosen wire, the pigtail catheter was exchanged for a  105/12 cm multi side-hole EKOS ultrasound assisted infusion catheter. A postprocedural fluoroscopic image was obtained of the check demonstrating final catheter positioning. Following the procedure, both ultrasound assisted infusion catheter tips terminate within the distal aspects of the bilateral lower lobe sub segmental pulmonary arteries. Both vascular sheath were secured at the right neck with interrupted 0 Prolene sutures. The external catheter tubing was secured at the right chest and the lytic therapy was initiated. The patient tolerated the procedure well without immediate postprocedural complication. IMPRESSION: 1. Successful fluoroscopic guided initiation of bilateral ultrasound assisted catheter directed pulmonary arterial lysis for acute bilateral massive pulmonary embolism and right-sided heart strain. PLAN: -ICU observation during infusion -After at least 12 hours infusion, obtain pressure measurements and either removal of the catheters or continuation of the catheter directed thrombolysis. -consider lower extremity Doppler to exclude significant residual DVT Electronically Signed   By: Corlis Leak M.D.   On: 03/29/2019 16:28   Ir Rande Lawman F/u Eval Art/ven Final Day (ms)  Result Date: 03/30/2019 INDICATION: History of acute bilateral massive pulmonary embolism, post initiation of bilateral pulmonary arterial thrombolysis on 03/29/2019. Patient has completed 12 hours of bilateral pulmonary arterial lytic infusion and is seen in the ICU for repeat pulmonary arterial pressure measurements. EXAM: PULMONARY ARTERIAL PRESSURE MEASUREMENT AND PULMONARY INFUSION CATHETER REMOVAL COMPARISON:  Initiation of bilateral pulmonary arterial catheter directed thrombolysis-03/29/2019; chest CTA 03/29/2019 MEDICATIONS: None CONTRAST:  None required FLUOROSCOPY TIME:  None utilized COMPLICATIONS: None immediate. TECHNIQUE: The patient was positioned supine . Pulmonary arterial pressure min measurements via indwelling  pulmonary arterial catheters, 51/27 (37) mmHg. At this point, the procedure was terminated. All wires, catheters and sheaths were removed from the patient. Hemostasis was achieved at the right IJ access sites with manual compression. A dressing was placed. The patient tolerated the procedure well without  immediate postprocedural complication. IMPRESSION: Technically successful bilateral catheter directed pulmonary arterial thrombolysis with mild-moderate residual pulmonary hypertension. Electronically Signed   By: Corlis Leak M.D.   On: 03/30/2019 12:46    Microbiology: Recent Results (from the past 240 hour(s))  Blood culture (routine x 2)     Status: None (Preliminary result)   Collection Time: 04/12/19  3:16 AM  Result Value Ref Range Status   Specimen Description BLOOD LEFT ARM  Final   Special Requests   Final    BOTTLES DRAWN AEROBIC AND ANAEROBIC Blood Culture adequate volume   Culture   Final    NO GROWTH 1 DAY Performed at [facility], [address], [place], [place] [address]    Report Status PENDING  Incomplete  Blood culture (routine x 2)     Status: None (Preliminary result)   Collection Time: 04/12/19  3:16 AM  Result Value Ref Range Status   Specimen Description BLOOD RIGHT ARM  Final   Special Requests   Final    BOTTLES DRAWN AEROBIC ONLY Blood Culture results may not be optimal due to an excessive volume of blood received in culture bottles   Culture   Final    NO GROWTH 1 DAY Performed at [facility], [address] [place], [place] [address]    Report Status PENDING  Incomplete     Labs: Basic Metabolic Panel: Recent Labs  Lab 04/11/19 2254 04/12/19 0549 04/13/19 0443 04/14/19 0428  NA 133* 135 136 136  K 4.2 3.7 3.6 3.8  CL 103 102 101 102  CO2 GLUCOSE 138* 129* 122* 116*  BUN CREATININE 0.78 0.66 0.75 0.68  CALCIUM 8.7* 8.6* 8.5* 8.6*   CBC: Recent Labs  Lab 04/11/19 2254 04/12/19 0549 04/13/19 0443  04/14/19 0428  WBC 10.6* 9.8 7.8 8.1  NEUTROABS 7.9* 6.8  --   --   HGB 11.7* 11.3* 11.7* 11.6*  HCT 36.6 36.0 36.8 35.5*  MCV 91.5 91.4 89.3 89.0  PLT 309 305 311 326    CBG: Recent Labs  Lab 04/13/19 0748 04/13/19 1123 04/13/19 1648 04/13/19 2232  GLUCAP 110* 103* 98 95    Signed:  Vassie Loll MD.  [facility] 04/14/2019, 11:09 AM

## 2019-04-15 ENCOUNTER — Ambulatory Visit: Payer: 59 | Attending: Critical Care Medicine | Admitting: Critical Care Medicine

## 2019-04-15 ENCOUNTER — Encounter: Payer: Self-pay | Admitting: Critical Care Medicine

## 2019-04-15 ENCOUNTER — Other Ambulatory Visit: Payer: Self-pay

## 2019-04-15 VITALS — BP 129/87 | HR 102 | Temp 98.2°F | Resp 16 | Ht 64.0 in | Wt >= 6400 oz

## 2019-04-15 DIAGNOSIS — I82411 Acute embolism and thrombosis of right femoral vein: Secondary | ICD-10-CM | POA: Diagnosis not present

## 2019-04-15 DIAGNOSIS — I2699 Other pulmonary embolism without acute cor pulmonale: Secondary | ICD-10-CM | POA: Diagnosis not present

## 2019-04-15 DIAGNOSIS — I10 Essential (primary) hypertension: Secondary | ICD-10-CM

## 2019-04-15 LAB — POCT INR: INR: 3.2 — AB (ref 2.0–3.0)

## 2019-04-15 MED ORDER — AMLODIPINE BESYLATE 5 MG PO TABS: 5.0000 mg | ORAL_TABLET | Freq: Every day | ORAL | 3 refills | Status: DC

## 2019-04-15 NOTE — Assessment & Plan Note (Signed)
Associated right leg deep venous thrombosis we will continue warfarin as prescribed

## 2019-04-15 NOTE — Patient Instructions (Addendum)
Please scheudle an INR check with Susan Warren on April 22, 2019 in the morning   Take one half warfarin tablet tonight then resume 1 daily thereafter  A follow-up visit with Dr. Delford Field will be scheduled end of May and a primary care visit will be established at [facility]  A referral to bariatric medicine will be made as well  When you come to see Susan Warren on April 30 also obtain additional labs including complete metabolic panel and complete blood count

## 2019-04-15 NOTE — Progress Notes (Addendum)
Subjective:    Patient ID: Leonette Most, female    DOB: August 27, 1976, 43 y.o.   MRN: [id number]  43 y.o.F with VTE PE/DVT admit twice in past month.    The patient was initially admitted on 6 April after previously in March fracturing her right foot.  She was found during the initial admission which lasted until 13 April significant bilateral pulmonary emboli with minimal perfusion.  Ultrasound of the right leg did not show blood clots.  The patient was given intra-arterial TPA to the pulmonary arteries with interventional radiology and then given heparin IV and subsequently overlapped with warfarin.  The patient was discharged on the warfarin and an improved situation with improved oxygenation.  The patient then returned to the hospital on 19 April with increased pain in the right lower extremity.  A venogram of the right lower extremity at that point showed deep venous thrombosis in the right leg.  The patient was maintained on the warfarin consultation with vascular surgery was obtained and they did not recommend any other interventions.  Also the patient was found to have a large cyst in the liver with compression of the inferior vena cava.  General surgery was consulted and they recommended observation alone.  The patient now comes to the office for a post hospital visit visit 1 day after discharge.  INR today was 3.2 INR yesterday was 2.9.  The patient was seen by her clinical pharmacist at the same time that I saw the patient.  She states she still having pain in the right lower extremity.  Her shortness of breath however is markedly better.  There is no cough there is no significant chest pain at this time.  She works in Consulting civil engineer at [employer]  Below are the excerpts from recent discharge summaries  [facility] D/C summary: Admit date: 04/11/2019 Discharge date: 04/14/2019 Discharge Diagnoses:  Principal Problem:   Right leg swelling Active Problems:   Hypertension   PE (pulmonary  thromboembolism) (HCC)   Right leg DVT (HCC)   Hepatic cyst   Lymphedema of both lower extremities   Obesity, Class III, BMI 40-49.9 (morbid obesity) (HCC)   Discharge Condition: Stable and improved.  Patient discharged home with instruction to follow-up with PCP and general surgery as an outpatient.  Diet recommendation: low calorie/healthy diet   Filed Weights  04/11/19 2212 04/12/19 0708 Weight: (!) 208.7 kg (!) 222 kg   History of present illness:  As per H&P written by Dr. Odie Sera on 04/12/2019 43 y.o.femalewith medical history significant forobesity (BMI 79), hypertension, and PE on warfarin, now presenting to the emergency department for evaluation of progressive pain and swelling involving the right leg.Patient reports that the right leg pain and swelling began approximately 2 weeks ago when she was in the hospital and being managed for massive bilateral PE. Pain has slowly progressed since then and she has also developed progressive edema. Pain is mainly in the proximal and anteromedial aspect of the leg. She reports that her chronic left leg swelling is at baseline. She denies any fevers or chills, denies any cough, shortness of breath, or chest pain, and denies any melena or hematochezia. She has not noticed much redness involving the right leg and has not noted any wounds or boils or drainage.  ED Course:Upon arrival to the ED, patient is found to be saturating well on room air and with other vitals also stable. Chemistry panel notable for mild chronic hyponatremia and CBC features a slight  leukocytosis. Lactic acid is reassuringly normal and INR is therapeutic at 2.9. CT venogram was performed with concern for right femoral vein, profundus femoris vein, and possibly right popliteal vein DVT, as well as a large exophytic cyst arising from the inferior liver and extending into the upper pelvis causing mass-effect and compression of the IVC. Vascular surgery  was consulted by the ED physician and recommended a medical admission.  Hospital Course:  1-right leg swelling and pain: due to DVT (involving femoral, profundus femoris and popliteal vein). -Patient evaluated by vascular surgery who at this moment recommending no thrombolyzes or placement of IVC filter. -Continue anticoagulation -Drastic weight loss -Outpatient follow-up with general surgery (to assist with ongoing liver cyst and by that hopefully overall improvement in venous flow) -INR is therapeutic   2-PE -stable -no SOB or CP -good O2 sat on RA -continue anticoagulation.  3-HTN -continue norvasc -heart healthy diet recommended  4-hyponatremia -resolved with IVF's resuscitation -patient advise to maintain adequate hydration  5-hepatic cyst/occlusion IVC -continue anticoagulation -outpatient follow up with general surgery  6-morbid obesity -Body mass index is 84.01 kg/m. -lifestyle changes discussed -patient will benefit of aggressive weight loss. -outpatient follow up with bariatric clinic recommended   Dc Summary from prior admit: Admit date: 03/29/2019 Discharge date: 04/05/2019  Admitted From: Home Disposition:  home  Recommendations for Outpatient Follow-up:  1. Follow up with PCP in 1-2 weeks 2. Recommend repeat INR within 3-5 days  Discharge Condition:Stable CODE STATUS:Full Diet recommendation: Heart healthy   Brief/Interim Summary: 43yo with hx limited mobility, morbid obesity, recent R toe fracture which limits mobility presented with acute hypoxemic respiratory failure, found to have massive acute bilateral   Discharge Diagnoses:  Active Problems:   IUD (intrauterine device) in place   Acute massive pulmonary embolism (HCC)  1. Acute hypoxemic respiratory failure 1. O2 weaned to room air 2. Currently stable 2. Acute B PE 1. Currently continued on heparin gtt 2. Pt is s/p thrombolysis 4/6-4/7 3. Per Pulmonary, will require  lifelong anticoagulation 4. Given morbid obesity, NOAC not recommended. Now on therapeutic coumadin with goal of INR 2-3. 5. INRremains stable at 2.1, heparin now off 6. Discharge today on coumadin 5mg  daily, recommend daily INR 3. Morbid obesity 1. Remains stable at this time 2. Continue to recommenddiet/lifestyle modification    Past Medical History:  Diagnosis Date   Allergy    Anxiety    Depression    DVT (deep venous thrombosis) (HCC)    GERD (gastroesophageal reflux disease)    Hepatic cyst    Hypertension    Lymphedema of both lower extremities    Migraines    Morbid obesity with BMI of 70 and over, adult [facility])    PE (pulmonary thromboembolism) (HCC) 03/2019   Sleep apnea      Family History  Problem Relation Age of Onset   Hypertension Mother    Thyroid disease Mother    Hypertension Maternal Grandmother    Diabetes Maternal Grandmother    Breast cancer Maternal Grandmother    Stroke Maternal Grandmother    Heart attack Neg Hx      Social History   Socioeconomic History   Marital status: Married    Spouse name: Not on file   Number of children: Not on file   Years of education: 12   Highest education level: Not on file  Occupational History   Occupation: Admin.    Employer: [employer] strain: Not on  file   Food insecurity:    Worry: Not on file    Inability: Not on file   Transportation needs:    Medical: Not on file    Non-medical: Not on file  Tobacco Use   Smoking status: Never Smoker   Smokeless tobacco: Never Used  Substance and Sexual Activity   Alcohol use: Yes    Comment: occ   Drug use: No   Sexual activity: Not Currently    Birth control/protection: I.U.D.  Lifestyle   Physical activity:    Days per week: Not on file    Minutes per session: Not on file   Stress: Not on file  Relationships   Social connections:    Talks on phone: Not on file    Gets  together: Not on file    Attends religious service: Not on file    Active member of club or organization: Not on file    Attends meetings of clubs or organizations: Not on file    Relationship status: Not on file   Intimate partner violence:    Fear of current or ex partner: Not on file    Emotionally abused: Not on file    Physically abused: Not on file    Forced sexual activity: Not on file  Other Topics Concern   Not on file  Social History Narrative   Married, 1 child     Allergies  Allergen Reactions   Adhesive [Tape] Anaphylaxis and Swelling    *Adhesive Spray*      Outpatient Medications Prior to Visit  Medication Sig Dispense Refill   acetaminophen (TYLENOL) 325 MG tablet Take 2 tablets (650 mg total) by mouth every 6 (six) hours as needed for mild pain or headache (or Fever >/= 101). 40 tablet 0   oxyCODONE (ROXICODONE) 5 MG immediate release tablet Take 1 tablet (5 mg total) by mouth every 4 (four) hours as needed for up to 5 days for severe pain. 30 tablet 0   warfarin (COUMADIN) 5 MG tablet Take 1 tablet (5 mg total) by mouth one time only at 6 PM for 30 days. 30 tablet 1   amLODipine (NORVASC) 5 MG tablet Take 1 tablet (5 mg total) by mouth daily. 30 tablet 0   docusate sodium (COLACE) 100 MG capsule Take 1 capsule (100 mg total) by mouth 2 (two) times daily as needed for moderate constipation. (Patient not taking: Reported on 04/15/2019) 30 capsule 2   polyethylene glycol (MIRALAX / GLYCOLAX) 17 g packet Take 17 g by mouth daily as needed for mild constipation. (Patient not taking: Reported on 04/15/2019) 28 each 1   pantoprazole (PROTONIX) 40 MG tablet Take 1 tablet (40 mg total) by mouth daily. (Patient not taking: Reported on 04/15/2019) 30 tablet 1   No facility-administered medications prior to visit.      Review of Systems  Constitutional: Negative for fatigue and fever.  HENT: Positive for sinus pressure. Negative for ear discharge, ear pain and  trouble swallowing.   Respiratory: Negative for cough, chest tightness and shortness of breath.   Cardiovascular: Positive for leg swelling. Negative for chest pain and palpitations.  Gastrointestinal: Negative.   Genitourinary: Negative.   Neurological: Positive for headaches. Negative for dizziness, tremors, seizures, syncope and weakness.  Psychiatric/Behavioral: Negative.  The patient is not nervous/anxious.        Objective:   Physical Exam Vitals:   04/15/19 1419  BP: 129/87  Pulse: (!) 102  Resp: 16  Temp: 98.2  F (36.8 C)  TempSrc: Oral  SpO2: 95%  Weight: (!) 489 lb 6.4 oz (222 kg)  Height: 5\' 4"  (1.626 m)    Gen: Pleasant, morbidly obese  in no distress,  normal affect  ENT: No lesions,  mouth clear,  oropharynx clear, no postnasal drip  Neck: No JVD, no TMG, no carotid bruits  Lungs: No use of accessory muscles, no dullness to percussion, clear without rales or rhonchi  Cardiovascular: RRR, heart sounds normal, no murmur or gallops, brawny edema RLE  Abdomen: soft and NT, no HSM,  BS normal  Musculoskeletal: No deformities, no cyanosis or clubbing  Neuro: alert, non focal  Skin: Warm, no lesions or rashes  Lab Results  Component Value Date   INR 3.2 (A) 04/15/2019   INR 2.9 (H) 04/14/2019   INR 3.4 (H) 04/13/2019     Procedures/Studies: ImagingResults  Ct Angio Chest Pe W/cm &/or Wo Cm  Result Date: 03/29/2019 CLINICAL DATA:  Shortness of breath. EXAM: CT ANGIOGRAPHY CHEST WITH CONTRAST TECHNIQUE: Multidetector CT imaging of the chest was performed using the standard protocol during bolus administration of intravenous contrast. Multiplanar CT image reconstructions and MIPs were obtained to evaluate the vascular anatomy. CONTRAST:  OMNIPAQUE IOHEXOL 350 MG/ML SOLN COMPARISON:  Chest x-ray dated 03/29/2019 and CT scan dated 02/05/2017 FINDINGS: Cardiovascular: There are massive bilateral pulmonary emboli. There is almost complete obstruction of  the pulmonary arteries in the left lung with a tiny amount of flow into the left upper lobe. There is only minimal flow into the pulmonary arteries of the right lung due to the massive emboli. RV/LV ratio is 2.6 indicating marked right heart strain. Mediastinum/Nodes: No enlarged mediastinal, hilar, or axillary lymph nodes. Thyroid gland, trachea, and esophagus demonstrate no significant findings. Lungs/Pleura: Lungs are clear. No pleural effusion or pneumothorax. Upper Abdomen: Normal. Musculoskeletal: No chest wall abnormality. No acute or significant osseous findings. Review of the MIP images confirms the above findings. IMPRESSION: 1. Massive bilateral pulmonary emboli with minimal perfusion to both lungs. 2. Markedly abnormal RV/LV ratio of 2.6. Critical Value/emergent results were called by telephone at the time of interpretation on 03/29/2019 at 12:30 pm to [facility], PA-C , who verbally acknowledged these results. Electronically Signed   By: Francene Boyers M.D.   On: 03/29/2019 12:32   Ir US Guide Vasc Access Right  Result Date: 03/29/2019 INDICATION: Acute massive bilateral pulmonary emboli with significant right heart strain. EXAM: 1. ULTRASOUND GUIDANCE FOR VENOUS ACCESS X2 2. BILATERAL PULMONARY ARTERIOGRAPHY 3. FLUOROSCOPIC GUIDED PLACEMENT OF BILATERAL PULMONARY ARTERIAL LYTIC INFUSION CATHETERS COMPARISON:  CTA 03/29/2019 MEDICATIONS: Intravenous Fentanyl and Versed 1mg  were administered as conscious sedation during continuous monitoring of the patient's level of consciousness and physiological / cardiorespiratory status by the radiology RN, with a total moderate sedation time of 20 minutes. CONTRAST:  None FLUOROSCOPY TIME:  4 minutes 30 seconds; 65 mGy COMPLICATIONS: None immediate TECHNIQUE: Informed written consent was obtained from the patient after a discussion of the risks, benefits and alternatives to treatment. Questions regarding the procedure were encouraged and answered. A  timeout was performed prior to the initiation of the procedure. Ultrasound scanning was performed demonstrating patency of the right internal jugular vein which was selected for vascular access. The right neck was prepped and draped in the usual sterile fashion, and a sterile drape was applied covering the operative field. Maximum barrier sterile technique with sterile gowns and gloves were used for the procedure. A timeout was performed prior to  the initiation of the procedure. Local anesthesia was provided with 1% lidocaine. Under direct ultrasound guidance, the right internal jugular vein was accessed with a micro puncture sheath ultimately allowing placement of a 6 French vascular sheath. Slightly cranial to this initial access, the right internal jugular vein was again accessed with a micropuncture sheath ultimately allowing placement of a 6 French vascular sheath. With the use of a Benson wire, a 5 French angled pigtail catheter was advanced into the right main pulmonary artery. Over an exchange length Rosen wire, the pigtail catheter was exchanged for a 105/18 cm multi side-hole EKOS ultrasound assisted infusion catheter. With the use of a Bentson wire, via the second sheath a 5 French pigtail catheter was advanced into the left main pulmonary artery . Over an exchange length Rosen wire, the pigtail catheter was exchanged for a 105/12 cm multi side-hole EKOS ultrasound assisted infusion catheter. A postprocedural fluoroscopic image was obtained of the check demonstrating final catheter positioning. Following the procedure, both ultrasound assisted infusion catheter tips terminate within the distal aspects of the bilateral lower lobe sub segmental pulmonary arteries. Both vascular sheath were secured at the right neck with interrupted 0 Prolene sutures. The external catheter tubing was secured at the right chest and the lytic therapy was initiated. The patient tolerated the procedure well without immediate  postprocedural complication. IMPRESSION: 1. Successful fluoroscopic guided initiation of bilateral ultrasound assisted catheter directed pulmonary arterial lysis for acute bilateral massive pulmonary embolism and right-sided heart strain. PLAN: -ICU observation during infusion -After at least 12 hours infusion, obtain pressure measurements and either removal of the catheters or continuation of the catheter directed thrombolysis. -consider lower extremity Doppler to exclude significant residual DVT Electronically Signed   By: Corlis Leak M.D.   On: 03/29/2019 16:28   Dg Chest Port 1 View  Result Date: 03/31/2019 CLINICAL DATA:  Shortness of Breath EXAM: PORTABLE CHEST 1 VIEW COMPARISON:  03/29/2019 FINDINGS: Cardiac shadow is stable. The lungs are well aerated bilaterally. No focal infiltrate or sizable effusion is seen. No acute bony abnormality is noted. IMPRESSION: No active disease. Electronically Signed   By: Alcide Clever M.D.   On: 03/31/2019 08:22   Dg Chest Portable 1 View  Result Date: 03/29/2019 CLINICAL DATA:  Onset shortness of breath and tachycardia 3 hours ago. EXAM: PORTABLE CHEST 1 VIEW COMPARISON:  CT chest and PA and lateral chest 02/05/2017. FINDINGS: The lungs are clear. Heart size is normal. No pneumothorax or pleural fluid. No acute or focal bony abnormality. IMPRESSION: Negative chest. Electronically Signed   By: Drusilla Kanner M.D.   On: 03/29/2019 10:32   Vas [place] Lower Extremity Venous (dvt)  Result Date: 03/30/2019  Lower Venous Study Indications: Pulmonary embolism.  Limitations: Body habitus and poor ultrasound/tissue interface. Performing Technologist: Chanda Busing RVT  Examination Guidelines: A complete evaluation includes B-mode imaging, spectral Doppler, color Doppler, and power Doppler as needed of all accessible portions of each vessel. Bilateral testing is considered an integral part of a complete examination. Limited examinations for reoccurring indications may be  performed as noted.  Right Venous Findings: +---------+---------------+---------+-----------+----------+--------------+            Compressibility Phasicity Spontaneity Properties Summary         +---------+---------------+---------+-----------+----------+--------------+  CFV       Full            No        No                                     +---------+---------------+---------+-----------+----------+--------------+  SFJ       Full                                                             +---------+---------------+---------+-----------+----------+--------------+  FV Prox   Full                                                             +---------+---------------+---------+-----------+----------+--------------+  FV Mid                                                     Not visualized  +---------+---------------+---------+-----------+----------+--------------+  FV Distal                                                  Not visualized  +---------+---------------+---------+-----------+----------+--------------+  PFV                                                        Not visualized  +---------+---------------+---------+-----------+----------+--------------+  POP                                                        Not visualized  +---------+---------------+---------+-----------+----------+--------------+  PTV                                                        Not visualized  +---------+---------------+---------+-----------+----------+--------------+  PERO                                                       Not visualized  +---------+---------------+---------+-----------+----------+--------------+  Left Venous Findings: +---------+---------------+---------+-----------+----------+--------------+            Compressibility Phasicity Spontaneity Properties Summary         +---------+---------------+---------+-----------+----------+--------------+  CFV       Full            Yes       Yes                                     +---------+---------------+---------+-----------+----------+--------------+  SFJ       Full                                                             +---------+---------------+---------+-----------+----------+--------------+  FV Prox   Full                                                             +---------+---------------+---------+-----------+----------+--------------+  FV Mid    Full                                                             +---------+---------------+---------+-----------+----------+--------------+  FV Distal                                                  Not visualized  +---------+---------------+---------+-----------+----------+--------------+  PFV                                                        Not visualized  +---------+---------------+---------+-----------+----------+--------------+  POP       Full            Yes       No                                     +---------+---------------+---------+-----------+----------+--------------+  PTV                                                        Not visualized  +---------+---------------+---------+-----------+----------+--------------+  PERO                                                       Not visualized  +---------+---------------+---------+-----------+----------+--------------+    Summary: Right: There is no evidence of deep vein thrombosis in the lower extremity. However, portions of this examination were limited- see technologist comments above. No cystic structure found in the popliteal fossa. Left: There is no evidence of deep vein thrombosis in the lower extremity. However, portions of this examination were limited- see technologist comments above. No cystic structure found in the popliteal fossa.  *See table(s) above for measurements and observations. Electronically signed by Waverly Ferrari MD on 03/30/2019 at 5:18:24 PM.    Final    Ir Infusion Thrombol Arterial Initial (ms)  Result Date:  03/29/2019 INDICATION: Acute massive bilateral pulmonary emboli with significant right heart strain. EXAM: 1. ULTRASOUND GUIDANCE FOR VENOUS ACCESS X2 2. BILATERAL PULMONARY ARTERIOGRAPHY 3. FLUOROSCOPIC GUIDED PLACEMENT OF BILATERAL PULMONARY ARTERIAL LYTIC INFUSION CATHETERS COMPARISON:  CTA 03/29/2019 MEDICATIONS: Intravenous Fentanyl and Versed 1mg  were administered as conscious sedation during continuous monitoring of the patient's level of  consciousness and physiological / cardiorespiratory status by the radiology RN, with a total moderate sedation time of 20 minutes. CONTRAST:  None FLUOROSCOPY TIME:  4 minutes 30 seconds; 65 mGy COMPLICATIONS: None immediate TECHNIQUE: Informed written consent was obtained from the patient after a discussion of the risks, benefits and alternatives to treatment. Questions regarding the procedure were encouraged and answered. A timeout was performed prior to the initiation of the procedure. Ultrasound scanning was performed demonstrating patency of the right internal jugular vein which was selected for vascular access. The right neck was prepped and draped in the usual sterile fashion, and a sterile drape was applied covering the operative field. Maximum barrier sterile technique with sterile gowns and gloves were used for the procedure. A timeout was performed prior to the initiation of the procedure. Local anesthesia was provided with 1% lidocaine. Under direct ultrasound guidance, the right internal jugular vein was accessed with a micro puncture sheath ultimately allowing placement of a 6 French vascular sheath. Slightly cranial to this initial access, the right internal jugular vein was again accessed with a micropuncture sheath ultimately allowing placement of a 6 French vascular sheath. With the use of a Benson wire, a 5 French angled pigtail catheter was advanced into the right main pulmonary artery. Over an exchange length Rosen wire, the pigtail catheter was  exchanged for a 105/18 cm multi side-hole EKOS ultrasound assisted infusion catheter. With the use of a Bentson wire, via the second sheath a 5 French pigtail catheter was advanced into the left main pulmonary artery . Over an exchange length Rosen wire, the pigtail catheter was exchanged for a 105/12 cm multi side-hole EKOS ultrasound assisted infusion catheter. A postprocedural fluoroscopic image was obtained of the check demonstrating final catheter positioning. Following the procedure, both ultrasound assisted infusion catheter tips terminate within the distal aspects of the bilateral lower lobe sub segmental pulmonary arteries. Both vascular sheath were secured at the right neck with interrupted 0 Prolene sutures. The external catheter tubing was secured at the right chest and the lytic therapy was initiated. The patient tolerated the procedure well without immediate postprocedural complication. IMPRESSION: 1. Successful fluoroscopic guided initiation of bilateral ultrasound assisted catheter directed pulmonary arterial lysis for acute bilateral massive pulmonary embolism and right-sided heart strain. PLAN: -ICU observation during infusion -After at least 12 hours infusion, obtain pressure measurements and either removal of the catheters or continuation of the catheter directed thrombolysis. -consider lower extremity Doppler to exclude significant residual DVT Electronically Signed   By: Corlis Leak M.D.   On: 03/29/2019 16:28   Ir Infusion Thrombol Arterial Initial (ms)  Result Date: 03/29/2019 INDICATION: Acute massive bilateral pulmonary emboli with significant right heart strain. EXAM: 1. ULTRASOUND GUIDANCE FOR VENOUS ACCESS X2 2. BILATERAL PULMONARY ARTERIOGRAPHY 3. FLUOROSCOPIC GUIDED PLACEMENT OF BILATERAL PULMONARY ARTERIAL LYTIC INFUSION CATHETERS COMPARISON:  CTA 03/29/2019 MEDICATIONS: Intravenous Fentanyl and Versed 1mg  were administered as conscious sedation during continuous monitoring  of the patient's level of consciousness and physiological / cardiorespiratory status by the radiology RN, with a total moderate sedation time of 20 minutes. CONTRAST:  None FLUOROSCOPY TIME:  4 minutes 30 seconds; 65 mGy COMPLICATIONS: None immediate TECHNIQUE: Informed written consent was obtained from the patient after a discussion of the risks, benefits and alternatives to treatment. Questions regarding the procedure were encouraged and answered. A timeout was performed prior to the initiation of the procedure. Ultrasound scanning was performed demonstrating patency of the right internal jugular vein which was selected for vascular  access. The right neck was prepped and draped in the usual sterile fashion, and a sterile drape was applied covering the operative field. Maximum barrier sterile technique with sterile gowns and gloves were used for the procedure. A timeout was performed prior to the initiation of the procedure. Local anesthesia was provided with 1% lidocaine. Under direct ultrasound guidance, the right internal jugular vein was accessed with a micro puncture sheath ultimately allowing placement of a 6 French vascular sheath. Slightly cranial to this initial access, the right internal jugular vein was again accessed with a micropuncture sheath ultimately allowing placement of a 6 French vascular sheath. With the use of a Benson wire, a 5 French angled pigtail catheter was advanced into the right main pulmonary artery. Over an exchange length Rosen wire, the pigtail catheter was exchanged for a 105/18 cm multi side-hole EKOS ultrasound assisted infusion catheter. With the use of a Bentson wire, via the second sheath a 5 French pigtail catheter was advanced into the left main pulmonary artery . Over an exchange length Rosen wire, the pigtail catheter was exchanged for a 105/12 cm multi side-hole EKOS ultrasound assisted infusion catheter. A postprocedural fluoroscopic image was obtained of the check  demonstrating final catheter positioning. Following the procedure, both ultrasound assisted infusion catheter tips terminate within the distal aspects of the bilateral lower lobe sub segmental pulmonary arteries. Both vascular sheath were secured at the right neck with interrupted 0 Prolene sutures. The external catheter tubing was secured at the right chest and the lytic therapy was initiated. The patient tolerated the procedure well without immediate postprocedural complication. IMPRESSION: 1. Successful fluoroscopic guided initiation of bilateral ultrasound assisted catheter directed pulmonary arterial lysis for acute bilateral massive pulmonary embolism and right-sided heart strain. PLAN: -ICU observation during infusion -After at least 12 hours infusion, obtain pressure measurements and either removal of the catheters or continuation of the catheter directed thrombolysis. -consider lower extremity Doppler to exclude significant residual DVT Electronically Signed   By: Corlis Leak M.D.   On: 03/29/2019 16:28   Ir Rande Lawman F/u Eval Art/ven Final Day (ms)  Result Date: 03/30/2019 INDICATION: History of acute bilateral massive pulmonary embolism, post initiation of bilateral pulmonary arterial thrombolysis on 03/29/2019. Patient has completed 12 hours of bilateral pulmonary arterial lytic infusion and is seen in the ICU for repeat pulmonary arterial pressure measurements. EXAM: PULMONARY ARTERIAL PRESSURE MEASUREMENT AND PULMONARY INFUSION CATHETER REMOVAL COMPARISON:  Initiation of bilateral pulmonary arterial catheter directed thrombolysis-03/29/2019; chest CTA 03/29/2019 MEDICATIONS: None CONTRAST:  None required FLUOROSCOPY TIME:  None utilized COMPLICATIONS: None immediate. TECHNIQUE: The patient was positioned supine . Pulmonary arterial pressure min measurements via indwelling pulmonary arterial catheters, 51/27 (37) mmHg. At this point, the procedure was terminated. All wires, catheters and sheaths were  removed from the patient. Hemostasis was achieved at the right IJ access sites with manual compression. A dressing was placed. The patient tolerated the procedure well without immediate postprocedural complication. IMPRESSION: Technically successful bilateral catheter directed pulmonary arterial thrombolysis with mild-moderate residual pulmonary hypertension. Electronically Signed   By: Corlis Leak M.D.   On: 03/30/2019 12:46         Assessment & Plan:  I personally reviewed all images and lab data in the [facility] system as well as any outside material available during this office visit and agree with the  radiology impressions.   Acute massive pulmonary embolism (HCC) Acute bilateral massive pulmonary emboli Status post interventional radiology with direct lysis of clot in follow-up now with heparin  and now on warfarin  INR is 3.2 and thus will reduce warfarin to 2.5 mg x 1 dose then continue 5 mg a day thereafter returning in 1 week for follow-up with clinical pharmacy  At that time a repeat complete metabolic panel and complete blood count will be obtained  Given the patient's morbid obesity and significant pulmonary emboli clot burden I plan a minimum of 12 months of warfarin therapy and she may yet require this for life  Hypertension Hypertension reasonably controlled thus will maintain amlodipine as prescribed  Right leg DVT (HCC) Associated right leg deep venous thrombosis we will continue warfarin as prescribed  Obesity, morbid, BMI 50 or higher (HCC) The patient is morbidly obese now with pulmonary emboli as a complication.  BMI is at 84 kg/m  We will plan to refer to bariatric medicine for further consultation  She may yet need a sleep study   Nonnie was seen today for hospitalization follow-up.  Diagnoses and all orders for this visit:  PE (pulmonary thromboembolism) (HCC) -     INR -     Comprehensive metabolic panel; Future -     CBC with Differential/Platelet;  Future -     CBC with Differential/Platelet -     Comprehensive metabolic panel  Obesity, Class III, BMI 40-49.9 (morbid obesity) (HCC) -     Amb Referral to Bariatric Surgery  Acute massive pulmonary embolism (HCC)  Acute deep vein thrombosis (DVT) of femoral vein of right lower extremity (HCC)  Essential hypertension  Obesity, morbid, BMI 50 or higher (HCC)  Other orders -     amLODipine (NORVASC) 5 MG tablet; Take 1 tablet (5 mg total) by mouth daily.

## 2019-04-15 NOTE — Assessment & Plan Note (Signed)
The patient is morbidly obese now with pulmonary emboli as a complication.  BMI is at 84 kg/m  We will plan to refer to bariatric medicine for further consultation  She may yet need a sleep study

## 2019-04-15 NOTE — Assessment & Plan Note (Signed)
Hypertension reasonably controlled thus will maintain amlodipine as prescribed

## 2019-04-15 NOTE — Assessment & Plan Note (Addendum)
Acute bilateral massive pulmonary emboli Status post interventional radiology with direct lysis of clot in follow-up now with heparin and now on warfarin  INR is 3.2 and thus will reduce warfarin to 2.5 mg x 1 dose then continue 5 mg a day thereafter returning in 1 week for follow-up with clinical pharmacy  At that time a repeat complete metabolic panel and complete blood count will be obtained  Given the patient's morbid obesity and significant pulmonary emboli clot burden I plan a minimum of 12 months of warfarin therapy and she may yet require this for life

## 2019-04-16 ENCOUNTER — Encounter: Payer: 59 | Admitting: Pharmacist

## 2019-04-16 ENCOUNTER — Inpatient Hospital Stay: Payer: 59 | Admitting: Primary Care

## 2019-04-16 LAB — CBC WITH DIFFERENTIAL/PLATELET
Basophils Absolute: 0 10*3/uL (ref 0.0–0.2)
Basos: 0 %
EOS (ABSOLUTE): 0.5 10*3/uL — ABNORMAL HIGH (ref 0.0–0.4)
Eos: 5 %
Hematocrit: 38.3 % (ref 34.0–46.6)
Hemoglobin: 12.8 g/dL (ref 11.1–15.9)
Immature Grans (Abs): 0.1 10*3/uL (ref 0.0–0.1)
Immature Granulocytes: 1 %
Lymphocytes Absolute: 1.6 10*3/uL (ref 0.7–3.1)
Lymphs: 16 %
MCH: 28.6 pg (ref 26.6–33.0)
MCHC: 33.4 g/dL (ref 31.5–35.7)
MCV: 86 fL (ref 79–97)
Monocytes Absolute: 0.7 10*3/uL (ref 0.1–0.9)
Monocytes: 7 %
Neutrophils Absolute: 7 10*3/uL (ref 1.4–7.0)
Neutrophils: 71 %
Platelets: 439 10*3/uL (ref 150–450)
RBC: 4.47 x10E6/uL (ref 3.77–5.28)
RDW: 13.4 % (ref 11.7–15.4)
WBC: 9.9 10*3/uL (ref 3.4–10.8)

## 2019-04-16 LAB — COMPREHENSIVE METABOLIC PANEL
ALT: 8 IU/L (ref 0–32)
AST: 16 IU/L (ref 0–40)
Albumin/Globulin Ratio: 1.1 — ABNORMAL LOW (ref 1.2–2.2)
Albumin: 4 g/dL (ref 3.8–4.8)
Alkaline Phosphatase: 81 IU/L (ref 39–117)
BUN/Creatinine Ratio: 18 (ref 9–23)
BUN: 13 mg/dL (ref 6–24)
Bilirubin Total: 0.7 mg/dL (ref 0.0–1.2)
CO2: 23 mmol/L (ref 20–29)
Calcium: 9.4 mg/dL (ref 8.7–10.2)
Chloride: 98 mmol/L (ref 96–106)
Creatinine, Ser: 0.73 mg/dL (ref 0.57–1.00)
GFR calc Af Amer: 115 mL/min/{1.73_m2} (ref >59)
GFR calc non Af Amer: 100 mL/min/{1.73_m2} (ref >59)
Globulin, Total: 3.6 g/dL (ref 1.5–4.5)
Glucose: 99 mg/dL (ref 65–99)
Potassium: 4.5 mmol/L (ref 3.5–5.2)
Sodium: 137 mmol/L (ref 134–144)
Total Protein: 7.6 g/dL (ref 6.0–8.5)

## 2019-04-17 LAB — CULTURE, BLOOD (ROUTINE X 2)
Culture: NO GROWTH
Culture: NO GROWTH
Special Requests: ADEQUATE

## 2019-04-19 ENCOUNTER — Telehealth: Payer: Self-pay | Admitting: *Deleted

## 2019-04-19 NOTE — Telephone Encounter (Signed)
-----   Message from Storm Frisk, MD sent at 04/17/2019  4:19 PM EDT ----- Threasa Alpha, pls call ms Mcduffey and let her know CBC and CMET both normal.  Keep next appt with Franky Macho this week

## 2019-04-19 NOTE — Telephone Encounter (Signed)
Medical Assistant left message on patient's home and cell voicemail. Voicemail states to give a call back to [place] with [facility] at [phone]. Patient is aware pf CBC/CMP being normal and to keep CPP appointment with [facility].

## 2019-04-22 ENCOUNTER — Other Ambulatory Visit: Payer: Self-pay

## 2019-04-22 ENCOUNTER — Ambulatory Visit: Payer: 59 | Attending: Family Medicine | Admitting: Pharmacist

## 2019-04-22 DIAGNOSIS — I82411 Acute embolism and thrombosis of right femoral vein: Secondary | ICD-10-CM

## 2019-04-22 LAB — POCT INR: INR: 4.8 — AB (ref 2.0–3.0)

## 2019-04-23 ENCOUNTER — Other Ambulatory Visit: Payer: Self-pay | Admitting: General Surgery

## 2019-04-23 DIAGNOSIS — K7689 Other specified diseases of liver: Secondary | ICD-10-CM

## 2019-04-26 ENCOUNTER — Telehealth: Payer: Self-pay | Admitting: Critical Care Medicine

## 2019-04-26 NOTE — Telephone Encounter (Signed)
Patient called stating she was taking out of work when she went to the hospital. When patient had her visit with Dr. Delford Field she forgot to mention it to him and patient would like to know if she can be taken out of work until June 1st due to her DVT. Please f/u

## 2019-04-26 NOTE — Telephone Encounter (Signed)
A work note was prepared to [employer] til 05/24/19

## 2019-04-26 NOTE — Telephone Encounter (Signed)
Susan Warren with [employer] called to get information in regards to diagnoses medication and other clinical information. States they will fax over paperwork. Please follow up

## 2019-04-27 ENCOUNTER — Telehealth: Payer: Self-pay | Admitting: Critical Care Medicine

## 2019-04-27 NOTE — Telephone Encounter (Signed)
Pt called in stated if she could have the return to work note faxed over to her employer @ [phone] with claim #[id number] please follow up

## 2019-04-27 NOTE — Telephone Encounter (Signed)
Emailed work note to patient per patient request. Email address was updated.

## 2019-04-27 NOTE — Telephone Encounter (Signed)
Will await paperwork from [place] with [employer] and complete necessary fields of paperwork when received.

## 2019-04-27 NOTE — Telephone Encounter (Signed)
Faxed office note to [employer] at the fax number listed below.

## 2019-04-29 ENCOUNTER — Ambulatory Visit: Payer: 59 | Attending: Family Medicine | Admitting: Pharmacist

## 2019-04-29 ENCOUNTER — Other Ambulatory Visit: Payer: Self-pay

## 2019-04-29 DIAGNOSIS — I82411 Acute embolism and thrombosis of right femoral vein: Secondary | ICD-10-CM | POA: Diagnosis not present

## 2019-04-29 LAB — POCT INR: INR: 3.5 — AB (ref 2.0–3.0)

## 2019-05-04 ENCOUNTER — Telehealth: Payer: Self-pay | Admitting: Emergency Medicine

## 2019-05-04 NOTE — Telephone Encounter (Signed)
Patients Disability and FMLA Medical Certification filled out by Dr. Delford Field was faxed to [facility] [phone])  [phone]  hrs.

## 2019-05-04 NOTE — Telephone Encounter (Signed)
Left message on voicemail- f/u on paperwork.

## 2019-05-06 ENCOUNTER — Ambulatory Visit: Payer: 59 | Attending: Family Medicine | Admitting: Pharmacist

## 2019-05-06 ENCOUNTER — Other Ambulatory Visit: Payer: Self-pay

## 2019-05-06 DIAGNOSIS — I82411 Acute embolism and thrombosis of right femoral vein: Secondary | ICD-10-CM

## 2019-05-06 LAB — POCT INR: INR: 3.4 — AB (ref 2.0–3.0)

## 2019-05-13 ENCOUNTER — Ambulatory Visit: Payer: 59 | Attending: Family Medicine | Admitting: Pharmacist

## 2019-05-13 ENCOUNTER — Other Ambulatory Visit: Payer: Self-pay

## 2019-05-13 ENCOUNTER — Ambulatory Visit: Payer: 59 | Admitting: Critical Care Medicine

## 2019-05-13 DIAGNOSIS — I82411 Acute embolism and thrombosis of right femoral vein: Secondary | ICD-10-CM | POA: Diagnosis not present

## 2019-05-13 LAB — POCT INR: INR: 2.1 (ref 2.0–3.0)

## 2019-05-16 NOTE — Progress Notes (Signed)
Subjective:    Patient ID: Susan Warren, female    DOB: December 22, 1976, 43 y.o.   MRN: [id number]  43 y.o.F with VTE PE/DVT admit twice in past month.    The patient was initially admitted on 6 April after previously in March fracturing her right foot.  She was found during the initial admission which lasted until 13 April significant bilateral pulmonary emboli with minimal perfusion.  Ultrasound of the right leg did not show blood clots.  The patient was given intra-arterial TPA to the pulmonary arteries with interventional radiology and then given heparin IV and subsequently overlapped with warfarin.  The patient was discharged on the warfarin and an improved situation with improved oxygenation.  The patient then returned to the hospital on 19 April with increased pain in the right lower extremity.  A venogram of the right lower extremity at that point showed deep venous thrombosis in the right leg.  The patient was maintained on the warfarin consultation with vascular surgery was obtained and they did not recommend any other interventions.  Also the patient was found to have a large cyst in the liver with compression of the inferior vena cava.  General surgery was consulted and they recommended observation alone.  I saw this patient initially a month ago we enrolled her in our warfarin clinic.  She is now taking 2-1/2 mg of warfarin daily with the exception of 5 mg on Wednesday.  Her largest complaint continues to be that of severe right lower extremity pain and swelling.  Patient was seen when she was in the hospital by vascular surgery Dr. fields saw her and did not have any thing that he could offer her.  She could not tolerate lytic therapy because of her morbid obesity greater than 500 pounds.  She would like to go back to work on a part-time basis.  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   Below are the excerpts from recent discharge summaries  [facility] D/C  summary: Admit date: 04/11/2019 Discharge date: 04/14/2019 Discharge Diagnoses:  Principal Problem:   Right leg swelling Active Problems:   Hypertension   PE (pulmonary thromboembolism) (HCC)   Right leg DVT (HCC)   Hepatic cyst   Lymphedema of both lower extremities   Obesity, Class III, BMI 40-49.9 (morbid obesity) (HCC)   Discharge Condition: Stable and improved.  Patient discharged home with instruction to follow-up with PCP and general surgery as an outpatient.  Diet recommendation: low calorie/healthy diet   Filed Weights  04/11/19 2212 04/12/19 0708 Weight: (!) 208.7 kg (!) 222 kg   History of present illness:  As per H&P written by Dr. Odie Sera on 04/12/2019 43 y.o.femalewith medical history significant forobesity (BMI 79), hypertension, and PE on warfarin, now presenting to the emergency department for evaluation of progressive pain and swelling involving the right leg.Patient reports that the right leg pain and swelling began approximately 2 weeks ago when she was in the hospital and being managed for massive bilateral PE. Pain has slowly progressed since then and she has also developed progressive edema. Pain is mainly in the proximal and anteromedial aspect of the leg. She reports that her chronic left leg swelling is at baseline. She denies any fevers or chills, denies any cough, shortness of breath, or chest pain, and denies any melena or hematochezia. She has not noticed much redness involving the right leg and has not noted any wounds or boils or drainage.  ED Course:Upon  arrival to the ED, patient is found to be saturating well on room air and with other vitals also stable. Chemistry panel notable for mild chronic hyponatremia and CBC features a slight leukocytosis. Lactic acid is reassuringly normal and INR is therapeutic at 2.9. CT venogram was performed with concern for right femoral vein, profundus femoris vein, and possibly right popliteal  vein DVT, as well as a large exophytic cyst arising from the inferior liver and extending into the upper pelvis causing mass-effect and compression of the IVC. Vascular surgery was consulted by the ED physician and recommended a medical admission.  Hospital Course:  1-right leg swelling and pain: due to DVT (involving femoral, profundus femoris and popliteal vein). -Patient evaluated by vascular surgery who at this moment recommending no thrombolyzes or placement of IVC filter. -Continue anticoagulation -Drastic weight loss -Outpatient follow-up with general surgery (to assist with ongoing liver cyst and by that hopefully overall improvement in venous flow) -INR is therapeutic   2-PE -stable -no SOB or CP -good O2 sat on RA -continue anticoagulation.  3-HTN -continue norvasc -heart healthy diet recommended  4-hyponatremia -resolved with IVF's resuscitation -patient advise to maintain adequate hydration  5-hepatic cyst/occlusion IVC -continue anticoagulation -outpatient follow up with general surgery  6-morbid obesity -Body mass index is 84.01 kg/m. -lifestyle changes discussed -patient will benefit of aggressive weight loss. -outpatient follow up with bariatric clinic recommended  Note the patient's shortness of breath is at baseline     Past Medical History:  Diagnosis Date   Allergy    Anxiety    Depression    DVT (deep venous thrombosis) (HCC)    GERD (gastroesophageal reflux disease)    Hepatic cyst    Hypertension    Lymphedema of both lower extremities    Migraines    Morbid obesity with BMI of 70 and over, adult [facility])    PE (pulmonary thromboembolism) (HCC) 03/2019   Sleep apnea      Family History  Problem Relation Age of Onset   Hypertension Mother    Thyroid disease Mother    Hypertension Maternal Grandmother    Diabetes Maternal Grandmother    Breast cancer Maternal Grandmother    Stroke Maternal Grandmother     Heart attack Neg Hx      Social History   Socioeconomic History   Marital status: Married    Spouse name: Not on file   Number of children: Not on file   Years of education: 12   Highest education level: Not on file  Occupational History   Occupation: Admin.    Employer: [employer] strain: Not on file   Food insecurity:    Worry: Not on file    Inability: Not on file   Transportation needs:    Medical: Not on file    Non-medical: Not on file  Tobacco Use   Smoking status: Never Smoker   Smokeless tobacco: Never Used  Substance and Sexual Activity   Alcohol use: Yes    Comment: occ   Drug use: No   Sexual activity: Not Currently    Birth control/protection: I.U.D.  Lifestyle   Physical activity:    Days per week: Not on file    Minutes per session: Not on file   Stress: Not on file  Relationships   Social connections:    Talks on phone: Not on file    Gets together: Not on file    Attends religious service: Not  on file    Active member of club or organization: Not on file    Attends meetings of clubs or organizations: Not on file    Relationship status: Not on file   Intimate partner violence:    Fear of current or ex partner: Not on file    Emotionally abused: Not on file    Physically abused: Not on file    Forced sexual activity: Not on file  Other Topics Concern   Not on file  Social History Narrative   Married, 1 child     Allergies  Allergen Reactions   Adhesive [Tape] Anaphylaxis and Swelling    *Adhesive Spray*      Outpatient Medications Prior to Visit  Medication Sig Dispense Refill   acetaminophen (TYLENOL) 325 MG tablet Take 2 tablets (650 mg total) by mouth every 6 (six) hours as needed for mild pain or headache (or Fever >/= 101). 40 tablet 0   amLODipine (NORVASC) 5 MG tablet Take 1 tablet (5 mg total) by mouth daily. 30 tablet 3   warfarin (COUMADIN) 5 MG tablet Take 1  tablet (5 mg total) by mouth one time only at 6 PM for 30 days. 30 tablet 1   docusate sodium (COLACE) 100 MG capsule Take 1 capsule (100 mg total) by mouth 2 (two) times daily as needed for moderate constipation. (Patient not taking: Reported on 04/15/2019) 30 capsule 2   polyethylene glycol (MIRALAX / GLYCOLAX) 17 g packet Take 17 g by mouth daily as needed for mild constipation. (Patient not taking: Reported on 04/15/2019) 28 each 1   No facility-administered medications prior to visit.      Review of Systems  Constitutional: Negative for fatigue and fever.  HENT: Positive for sinus pressure. Negative for ear discharge, ear pain and trouble swallowing.   Respiratory: Negative for cough, chest tightness and shortness of breath.   Cardiovascular: Positive for leg swelling. Negative for chest pain and palpitations.  Gastrointestinal: Negative.   Genitourinary: Negative.   Neurological: Positive for headaches. Negative for dizziness, tremors, seizures, syncope and weakness.  Psychiatric/Behavioral: Negative.  The patient is not nervous/anxious.        Objective:   Physical Exam Vitals:   05/18/19 1429  BP: (!) 148/88  Temp: (!) 97.3 F (36.3 C)  TempSrc: Oral  SpO2: 97%  Weight: (!) 505 lb (229.1 kg)  Height: 5\' 4"  (1.626 m)    Gen: Pleasant, morbidly obese  in no distress,  normal affect  ENT: No lesions,  mouth clear,  oropharynx clear, no postnasal drip  Neck: No JVD, no TMG, no carotid bruits  Lungs: No use of accessory muscles, no dullness to percussion, clear without rales or rhonchi  Cardiovascular: RRR, heart sounds normal, no murmur or gallops, brawny edema RLE particularly involving the thigh but also the lower extremities  Abdomen: soft and NT, no HSM,  BS normal  Musculoskeletal: No deformities, no cyanosis or clubbing  Neuro: alert, non focal  Skin: Warm, no lesions or rashes  Lab Results  Component Value Date   INR 2.1 05/13/2019   INR 3.4 (A)  05/06/2019   INR 3.5 (A) 04/29/2019     Procedures/Studies: ImagingResults  Ct Angio Chest Pe W/cm &/or Wo Cm  Result Date: 03/29/2019 CLINICAL DATA:  Shortness of breath. EXAM: CT ANGIOGRAPHY CHEST WITH CONTRAST TECHNIQUE: Multidetector CT imaging of the chest was performed using the standard protocol during bolus administration of intravenous contrast. Multiplanar CT image reconstructions and MIPs were obtained  to evaluate the vascular anatomy. CONTRAST:  OMNIPAQUE IOHEXOL 350 MG/ML SOLN COMPARISON:  Chest x-ray dated 03/29/2019 and CT scan dated 02/05/2017 FINDINGS: Cardiovascular: There are massive bilateral pulmonary emboli. There is almost complete obstruction of the pulmonary arteries in the left lung with a tiny amount of flow into the left upper lobe. There is only minimal flow into the pulmonary arteries of the right lung due to the massive emboli. RV/LV ratio is 2.6 indicating marked right heart strain. Mediastinum/Nodes: No enlarged mediastinal, hilar, or axillary lymph nodes. Thyroid gland, trachea, and esophagus demonstrate no significant findings. Lungs/Pleura: Lungs are clear. No pleural effusion or pneumothorax. Upper Abdomen: Normal. Musculoskeletal: No chest wall abnormality. No acute or significant osseous findings. Review of the MIP images confirms the above findings. IMPRESSION: 1. Massive bilateral pulmonary emboli with minimal perfusion to both lungs. 2. Markedly abnormal RV/LV ratio of 2.6. Critical Value/emergent results were called by telephone at the time of interpretation on 03/29/2019 at 12:30 pm to [facility], PA-C , who verbally acknowledged these results. Electronically Signed   By: Francene Boyers M.D.   On: 03/29/2019 12:32   Ir US Guide Vasc Access Right  Result Date: 03/29/2019 INDICATION: Acute massive bilateral pulmonary emboli with significant right heart strain. EXAM: 1. ULTRASOUND GUIDANCE FOR VENOUS ACCESS X2 2. BILATERAL PULMONARY ARTERIOGRAPHY 3.  FLUOROSCOPIC GUIDED PLACEMENT OF BILATERAL PULMONARY ARTERIAL LYTIC INFUSION CATHETERS COMPARISON:  CTA 03/29/2019 MEDICATIONS: Intravenous Fentanyl and Versed 1mg  were administered as conscious sedation during continuous monitoring of the patient's level of consciousness and physiological / cardiorespiratory status by the radiology RN, with a total moderate sedation time of 20 minutes. CONTRAST:  None FLUOROSCOPY TIME:  4 minutes 30 seconds; 65 mGy COMPLICATIONS: None immediate TECHNIQUE: Informed written consent was obtained from the patient after a discussion of the risks, benefits and alternatives to treatment. Questions regarding the procedure were encouraged and answered. A timeout was performed prior to the initiation of the procedure. Ultrasound scanning was performed demonstrating patency of the right internal jugular vein which was selected for vascular access. The right neck was prepped and draped in the usual sterile fashion, and a sterile drape was applied covering the operative field. Maximum barrier sterile technique with sterile gowns and gloves were used for the procedure. A timeout was performed prior to the initiation of the procedure. Local anesthesia was provided with 1% lidocaine. Under direct ultrasound guidance, the right internal jugular vein was accessed with a micro puncture sheath ultimately allowing placement of a 6 French vascular sheath. Slightly cranial to this initial access, the right internal jugular vein was again accessed with a micropuncture sheath ultimately allowing placement of a 6 French vascular sheath. With the use of a Benson wire, a 5 French angled pigtail catheter was advanced into the right main pulmonary artery. Over an exchange length Rosen wire, the pigtail catheter was exchanged for a 105/18 cm multi side-hole EKOS ultrasound assisted infusion catheter. With the use of a Bentson wire, via the second sheath a 5 French pigtail catheter was advanced into the  left main pulmonary artery . Over an exchange length Rosen wire, the pigtail catheter was exchanged for a 105/12 cm multi side-hole EKOS ultrasound assisted infusion catheter. A postprocedural fluoroscopic image was obtained of the check demonstrating final catheter positioning. Following the procedure, both ultrasound assisted infusion catheter tips terminate within the distal aspects of the bilateral lower lobe sub segmental pulmonary arteries. Both vascular sheath were secured at the right neck with interrupted 0  Prolene sutures. The external catheter tubing was secured at the right chest and the lytic therapy was initiated. The patient tolerated the procedure well without immediate postprocedural complication. IMPRESSION: 1. Successful fluoroscopic guided initiation of bilateral ultrasound assisted catheter directed pulmonary arterial lysis for acute bilateral massive pulmonary embolism and right-sided heart strain. PLAN: -ICU observation during infusion -After at least 12 hours infusion, obtain pressure measurements and either removal of the catheters or continuation of the catheter directed thrombolysis. -consider lower extremity Doppler to exclude significant residual DVT Electronically Signed   By: Corlis Leak  Hassell M.D.   On: 03/29/2019 16:28   Dg Chest Port 1 View  Result Date: 03/31/2019 CLINICAL DATA:  Shortness of Breath EXAM: PORTABLE CHEST 1 VIEW COMPARISON:  03/29/2019 FINDINGS: Cardiac shadow is stable. The lungs are well aerated bilaterally. No focal infiltrate or sizable effusion is seen. No acute bony abnormality is noted. IMPRESSION: No active disease. Electronically Signed   By: Alcide CleverMark  Lukens M.D.   On: 03/31/2019 08:22   Dg Chest Portable 1 View  Result Date: 03/29/2019 CLINICAL DATA:  Onset shortness of breath and tachycardia 3 hours ago. EXAM: PORTABLE CHEST 1 VIEW COMPARISON:  CT chest and PA and lateral chest 02/05/2017. FINDINGS: The lungs are clear. Heart size is normal. No  pneumothorax or pleural fluid. No acute or focal bony abnormality. IMPRESSION: Negative chest. Electronically Signed   By: Drusilla Kannerhomas  Dalessio M.D.   On: 03/29/2019 10:32   Vas [place] Lower Extremity Venous (dvt)  Result Date: 03/30/2019  Lower Venous Study Indications: Pulmonary embolism.  Limitations: Body habitus and poor ultrasound/tissue interface. Performing Technologist: Chanda BusingGregory Collins RVT  Examination Guidelines: A complete evaluation includes B-mode imaging, spectral Doppler, color Doppler, and power Doppler as needed of all accessible portions of each vessel. Bilateral testing is considered an integral part of a complete examination. Limited examinations for reoccurring indications may be performed as noted.  Right Venous Findings: +---------+---------------+---------+-----------+----------+--------------+            Compressibility Phasicity Spontaneity Properties Summary         +---------+---------------+---------+-----------+----------+--------------+  CFV       Full            No        No                                     +---------+---------------+---------+-----------+----------+--------------+  SFJ       Full                                                             +---------+---------------+---------+-----------+----------+--------------+  FV Prox   Full                                                             +---------+---------------+---------+-----------+----------+--------------+  FV Mid  Not visualized  +---------+---------------+---------+-----------+----------+--------------+  FV Distal                                                  Not visualized  +---------+---------------+---------+-----------+----------+--------------+  PFV                                                        Not visualized  +---------+---------------+---------+-----------+----------+--------------+  POP                                                         Not visualized  +---------+---------------+---------+-----------+----------+--------------+  PTV                                                        Not visualized  +---------+---------------+---------+-----------+----------+--------------+  PERO                                                       Not visualized  +---------+---------------+---------+-----------+----------+--------------+  Left Venous Findings: +---------+---------------+---------+-----------+----------+--------------+            Compressibility Phasicity Spontaneity Properties Summary         +---------+---------------+---------+-----------+----------+--------------+  CFV       Full            Yes       Yes                                    +---------+---------------+---------+-----------+----------+--------------+  SFJ       Full                                                             +---------+---------------+---------+-----------+----------+--------------+  FV Prox   Full                                                             +---------+---------------+---------+-----------+----------+--------------+  FV Mid    Full                                                             +---------+---------------+---------+-----------+----------+--------------+  FV Distal                                                  Not visualized  +---------+---------------+---------+-----------+----------+--------------+  PFV                                                        Not visualized  +---------+---------------+---------+-----------+----------+--------------+  POP       Full            Yes       No                                     +---------+---------------+---------+-----------+----------+--------------+  PTV                                                        Not visualized  +---------+---------------+---------+-----------+----------+--------------+  PERO                                                       Not visualized   +---------+---------------+---------+-----------+----------+--------------+    Summary: Right: There is no evidence of deep vein thrombosis in the lower extremity. However, portions of this examination were limited- see technologist comments above. No cystic structure found in the popliteal fossa. Left: There is no evidence of deep vein thrombosis in the lower extremity. However, portions of this examination were limited- see technologist comments above. No cystic structure found in the popliteal fossa.  *See table(s) above for measurements and observations. Electronically signed by Waverly Ferrari MD on 03/30/2019 at 5:18:24 PM.    Final    Ir Infusion Thrombol Arterial Initial (ms)  Result Date: 03/29/2019 INDICATION: Acute massive bilateral pulmonary emboli with significant right heart strain. EXAM: 1. ULTRASOUND GUIDANCE FOR VENOUS ACCESS X2 2. BILATERAL PULMONARY ARTERIOGRAPHY 3. FLUOROSCOPIC GUIDED PLACEMENT OF BILATERAL PULMONARY ARTERIAL LYTIC INFUSION CATHETERS COMPARISON:  CTA 03/29/2019 MEDICATIONS: Intravenous Fentanyl and Versed  were administered as conscious sedation during continuous monitoring of the patient's level of consciousness and physiological / cardiorespiratory status by the radiology RN, with a total moderate sedation time of 20 minutes. CONTRAST:  None FLUOROSCOPY TIME:  4 minutes 30 seconds; 65 mGy COMPLICATIONS: None immediate TECHNIQUE: Informed written consent was obtained from the patient after a discussion of the risks, benefits and alternatives to treatment. Questions regarding the procedure were encouraged and answered. A timeout was performed prior to the initiation of the procedure. Ultrasound scanning was performed demonstrating patency of the right internal jugular vein which was selected for vascular access. The right neck was prepped and draped in the usual sterile fashion, and a sterile drape was applied covering the operative field. Maximum barrier sterile  technique with sterile gowns and gloves were used for the procedure. A timeout was performed prior to the  initiation of the procedure. Local anesthesia was provided with 1% lidocaine. Under direct ultrasound guidance, the right internal jugular vein was accessed with a micro puncture sheath ultimately allowing placement of a 6 French vascular sheath. Slightly cranial to this initial access, the right internal jugular vein was again accessed with a micropuncture sheath ultimately allowing placement of a 6 French vascular sheath. With the use of a Benson wire, a 5 French angled pigtail catheter was advanced into the right main pulmonary artery. Over an exchange length Rosen wire, the pigtail catheter was exchanged for a 105/18 cm multi side-hole EKOS ultrasound assisted infusion catheter. With the use of a Bentson wire, via the second sheath a 5 French pigtail catheter was advanced into the left main pulmonary artery . Over an exchange length Rosen wire, the pigtail catheter was exchanged for a 105/12 cm multi side-hole EKOS ultrasound assisted infusion catheter. A postprocedural fluoroscopic image was obtained of the check demonstrating final catheter positioning. Following the procedure, both ultrasound assisted infusion catheter tips terminate within the distal aspects of the bilateral lower lobe sub segmental pulmonary arteries. Both vascular sheath were secured at the right neck with interrupted 0 Prolene sutures. The external catheter tubing was secured at the right chest and the lytic therapy was initiated. The patient tolerated the procedure well without immediate postprocedural complication. IMPRESSION: 1. Successful fluoroscopic guided initiation of bilateral ultrasound assisted catheter directed pulmonary arterial lysis for acute bilateral massive pulmonary embolism and right-sided heart strain. PLAN: -ICU observation during infusion -After at least 12 hours infusion, obtain pressure measurements and either  removal of the catheters or continuation of the catheter directed thrombolysis. -consider lower extremity Doppler to exclude significant residual DVT Electronically Signed   By: Corlis Leak M.D.   On: 03/29/2019 16:28   Ir Infusion Thrombol Arterial Initial (ms)  Result Date: 03/29/2019 INDICATION: Acute massive bilateral pulmonary emboli with significant right heart strain. EXAM: 1. ULTRASOUND GUIDANCE FOR VENOUS ACCESS X2 2. BILATERAL PULMONARY ARTERIOGRAPHY 3. FLUOROSCOPIC GUIDED PLACEMENT OF BILATERAL PULMONARY ARTERIAL LYTIC INFUSION CATHETERS COMPARISON:  CTA 03/29/2019 MEDICATIONS: Intravenous Fentanyl and Versed  were administered as conscious sedation during continuous monitoring of the patient's level of consciousness and physiological / cardiorespiratory status by the radiology RN, with a total moderate sedation time of 20 minutes. CONTRAST:  None FLUOROSCOPY TIME:  4 minutes 30 seconds; 65 mGy COMPLICATIONS: None immediate TECHNIQUE: Informed written consent was obtained from the patient after a discussion of the risks, benefits and alternatives to treatment. Questions regarding the procedure were encouraged and answered. A timeout was performed prior to the initiation of the procedure. Ultrasound scanning was performed demonstrating patency of the right internal jugular vein which was selected for vascular access. The right neck was prepped and draped in the usual sterile fashion, and a sterile drape was applied covering the operative field. Maximum barrier sterile technique with sterile gowns and gloves were used for the procedure. A timeout was performed prior to the initiation of the procedure. Local anesthesia was provided with 1% lidocaine. Under direct ultrasound guidance, the right internal jugular vein was accessed with a micro puncture sheath ultimately allowing placement of a 6 French vascular sheath. Slightly cranial to this initial access, the right internal jugular vein was  again accessed with a micropuncture sheath ultimately allowing placement of a 6 French vascular sheath. With the use of a Benson wire, a 5 French angled pigtail catheter was advanced into the right main pulmonary artery. Over an exchange length [employer],  the pigtail catheter was exchanged for a 105/18 cm multi side-hole EKOS ultrasound assisted infusion catheter. With the use of a Bentson wire, via the second sheath a 5 French pigtail catheter was advanced into the left main pulmonary artery . Over an exchange length Rosen wire, the pigtail catheter was exchanged for a 105/12 cm multi side-hole EKOS ultrasound assisted infusion catheter. A postprocedural fluoroscopic image was obtained of the check demonstrating final catheter positioning. Following the procedure, both ultrasound assisted infusion catheter tips terminate within the distal aspects of the bilateral lower lobe sub segmental pulmonary arteries. Both vascular sheath were secured at the right neck with interrupted 0 Prolene sutures. The external catheter tubing was secured at the right chest and the lytic therapy was initiated. The patient tolerated the procedure well without immediate postprocedural complication. IMPRESSION: 1. Successful fluoroscopic guided initiation of bilateral ultrasound assisted catheter directed pulmonary arterial lysis for acute bilateral massive pulmonary embolism and right-sided heart strain. PLAN: -ICU observation during infusion -After at least 12 hours infusion, obtain pressure measurements and either removal of the catheters or continuation of the catheter directed thrombolysis. -consider lower extremity Doppler to exclude significant residual DVT Electronically Signed   By: Corlis Leak M.D.   On: 03/29/2019 16:28   Ir Rande Lawman F/u Eval Art/ven Final Day (ms)  Result Date: 03/30/2019 INDICATION: History of acute bilateral massive pulmonary embolism, post initiation of bilateral pulmonary arterial thrombolysis on  03/29/2019. Patient has completed 12 hours of bilateral pulmonary arterial lytic infusion and is seen in the ICU for repeat pulmonary arterial pressure measurements. EXAM: PULMONARY ARTERIAL PRESSURE MEASUREMENT AND PULMONARY INFUSION CATHETER REMOVAL COMPARISON:  Initiation of bilateral pulmonary arterial catheter directed thrombolysis-03/29/2019; chest CTA 03/29/2019 MEDICATIONS: None CONTRAST:  None required FLUOROSCOPY TIME:  None utilized COMPLICATIONS: None immediate. TECHNIQUE: The patient was positioned supine . Pulmonary arterial pressure min measurements via indwelling pulmonary arterial catheters, 51/27 (37) mmHg. At this point, the procedure was terminated. All wires, catheters and sheaths were removed from the patient. Hemostasis was achieved at the right IJ access sites with manual compression. A dressing was placed. The patient tolerated the procedure well without immediate postprocedural complication. IMPRESSION: Technically successful bilateral catheter directed pulmonary arterial thrombolysis with mild-moderate residual pulmonary hypertension. Electronically Signed   By: Corlis Leak M.D.   On: 03/30/2019 12:46         Assessment & Plan:  I personally reviewed all images and lab data in the [facility] system as well as any outside material available during this office visit and agree with the  radiology impressions.   Right leg DVT (HCC) Severe deep venous thrombosis right lower extremity with occlusion of the common femoral along with the profundus and popliteal veins.  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  She is not a candidate for thrombolysis and I agree an inferior vena cava filter is not indicated.  I will have another conversation with vascular surgery to see if there is anything else that can be offered this patient  In the meantime I have cleared her to return to work on a part-time basis starting June 1  and she will continue warfarin likely for life  Hepatic cyst Patient does have a hepatic cyst which will need to be followed up by general surgery at a later date  Lymphedema of both lower extremities As per deep venous thrombosis assessment the chronic lymphedema of both lower extremities is a major concern  Obesity,  morbid, BMI 50 or higher (HCC) At some point this patient will indeed need bariatric medicine involvement   Kashena was seen today for follow-up and leg swelling.  Diagnoses and all orders for this visit:  Acute deep vein thrombosis (DVT) of femoral vein of right lower extremity (HCC)  Acute massive pulmonary embolism (HCC)  Lymphedema of both lower extremities  Hepatic cyst  Obesity, morbid, BMI 50 or higher (HCC)

## 2019-05-17 ENCOUNTER — Other Ambulatory Visit: Payer: Self-pay | Admitting: Critical Care Medicine

## 2019-05-18 ENCOUNTER — Other Ambulatory Visit: Payer: Self-pay

## 2019-05-18 ENCOUNTER — Ambulatory Visit: Payer: 59 | Attending: Critical Care Medicine | Admitting: Critical Care Medicine

## 2019-05-18 ENCOUNTER — Encounter: Payer: Self-pay | Admitting: Critical Care Medicine

## 2019-05-18 VITALS — BP 148/88 | Temp 97.3°F | Ht 64.0 in | Wt >= 6400 oz

## 2019-05-18 DIAGNOSIS — Z6841 Body Mass Index (BMI) 40.0 and over, adult: Secondary | ICD-10-CM

## 2019-05-18 DIAGNOSIS — I890 Lymphedema, not elsewhere classified: Secondary | ICD-10-CM

## 2019-05-18 DIAGNOSIS — I82411 Acute embolism and thrombosis of right femoral vein: Secondary | ICD-10-CM

## 2019-05-18 DIAGNOSIS — K7689 Other specified diseases of liver: Secondary | ICD-10-CM

## 2019-05-18 DIAGNOSIS — I2699 Other pulmonary embolism without acute cor pulmonale: Secondary | ICD-10-CM

## 2019-05-18 NOTE — Assessment & Plan Note (Signed)
Patient does have a hepatic cyst which will need to be followed up by general surgery at a later date

## 2019-05-18 NOTE — Progress Notes (Signed)
Patient is f/u for P.A, and she is still having right leg swelling and have a DVT in the rt leg,

## 2019-05-18 NOTE — Assessment & Plan Note (Signed)
As per deep venous thrombosis assessment the chronic lymphedema of both lower extremities is a major concern

## 2019-05-18 NOTE — Assessment & Plan Note (Signed)
Severe deep venous thrombosis right lower extremity with occlusion of the common femoral along with the profundus and popliteal veins.  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  She is not a candidate for thrombolysis and I agree an inferior vena cava filter is not indicated.  I will have another conversation with vascular surgery to see if there is anything else that can be offered this patient  In the meantime I have cleared her to return to work on a part-time basis starting June 1 and she will continue warfarin likely for life

## 2019-05-18 NOTE — Patient Instructions (Signed)
No change in warfarin dose  I will call the vascular surgeon to see if there is anything else that can be offered for you  Please keep your leg elevated as much as possible and you may return to work on June 1 on a part-time basis  Please take frequent breaks and get up and walk around and then sit down again and keep your leg elevated if you can while working on the computer  I will call you once I have spoken with vascular surgeon  Return to see Dr. Delford Field in 2 months

## 2019-05-18 NOTE — Assessment & Plan Note (Signed)
At some point this patient will indeed need bariatric medicine involvement

## 2019-05-24 ENCOUNTER — Telehealth: Payer: Self-pay | Admitting: Critical Care Medicine

## 2019-05-24 NOTE — Telephone Encounter (Signed)
Patients call returned.  Patient identified by name and date of birth.  Patient needs letter stating specifically how many hours patient can work and how many days a week she can work.  Patient also had paperwork faxed for short term disability.  Patient can wait for this paperwork but needs the work note in order to return to work.  Patient advised that Dr. Delford Field was out of the office for the week and would return Monday 8th.  Patient advised that message would be past along to head of practice.  Any information would be passed along.  Patient acknowledged understanding of advice.

## 2019-05-24 NOTE — Telephone Encounter (Signed)
Call returned to insurance company.  Talked to [facility] in an attempt to clarify instructions for patients return to work.  This Nurse gave written instructions from Dr. Florene Route writing in the chart.  Gearldine Bienenstock acknowledged receipt of information.

## 2019-05-24 NOTE — Telephone Encounter (Signed)
Patient called stating that she needs the letter to state 5 hours per day instead of part time. Please follow up.

## 2019-05-24 NOTE — Telephone Encounter (Signed)
Brandy with [employer] called to ask a couple of questions in regards to restrictions and limitations for the patients employer. Please follow up.

## 2019-05-25 NOTE — Telephone Encounter (Signed)
Follow up    Pt calling to check on the updated letter for her job giving clarification on the part time basis. Please f/u

## 2019-05-25 NOTE — Telephone Encounter (Signed)
I just made a new letter and it is printed out on back office printer [employer]

## 2019-05-26 NOTE — Telephone Encounter (Signed)
Note I filled out an FMLA form last week.  I made another letter and it is printed out on clinical printer.

## 2019-05-26 NOTE — Telephone Encounter (Signed)
Patients call taken.  Patient identified by name and date of birth.  Patients HR department is asking for a specific letter stating that the patient is to work X amount of days for 5 hours a day and is to take breaks X amount of times for X amount of minutes.  Patient advised that an attempt would be made to contact Dr. Delford Field.

## 2019-05-26 NOTE — Telephone Encounter (Signed)
Patients new letter emailed to patient.  Patient called and confirmed receipt of email.  Patient also informed of information of FMLA paperwork

## 2019-05-27 ENCOUNTER — Encounter: Payer: 59 | Admitting: Pharmacist

## 2019-05-27 ENCOUNTER — Telehealth: Payer: Self-pay | Admitting: Critical Care Medicine

## 2019-05-27 ENCOUNTER — Ambulatory Visit: Payer: 59 | Attending: Family Medicine | Admitting: Pharmacist

## 2019-05-27 ENCOUNTER — Other Ambulatory Visit: Payer: Self-pay

## 2019-05-27 ENCOUNTER — Ambulatory Visit: Payer: 59 | Admitting: Family Medicine

## 2019-05-27 DIAGNOSIS — I82411 Acute embolism and thrombosis of right femoral vein: Secondary | ICD-10-CM | POA: Diagnosis not present

## 2019-05-27 LAB — POCT INR: INR: 1.7 — AB (ref 2.0–3.0)

## 2019-05-27 NOTE — Telephone Encounter (Signed)
Brandy with [employer] called stating that they were told by the patient paperwork was faxed over but they have not received anything. Please follow up.   Fax # [phone]

## 2019-05-31 ENCOUNTER — Telehealth: Payer: Self-pay | Admitting: *Deleted

## 2019-05-31 NOTE — Telephone Encounter (Signed)
This came in while I was out.  I will fill out today.

## 2019-05-31 NOTE — Telephone Encounter (Signed)
Staff faxed disability forms to [employer] and a confirmation sheet was provided. Staff gived sheet for front staff to release notes to [employer] as well. Informed patient with information letting her know that forms was faxed and front staff will be working on release notes to [employer]. Patient verbalized understanding and requested that staff mailed her a copy of her forms that provider completed. Staff confirmed address and copy of the letter will be mailed out to patient today.

## 2019-06-01 ENCOUNTER — Telehealth: Payer: Self-pay | Admitting: Critical Care Medicine

## 2019-06-01 NOTE — Telephone Encounter (Signed)
Brandy with [employer] wants to get clarification on information/restrictions that was faxed over yesterday. Please follow up.

## 2019-06-02 NOTE — Telephone Encounter (Signed)
Nurse spoke with Theadora Rama and clarified instructions from Dr. Joya Gaskins.  Clarifications accepted .

## 2019-06-17 ENCOUNTER — Ambulatory Visit: Payer: 59 | Attending: Family Medicine | Admitting: Pharmacist

## 2019-06-17 ENCOUNTER — Other Ambulatory Visit: Payer: Self-pay

## 2019-06-17 DIAGNOSIS — I82411 Acute embolism and thrombosis of right femoral vein: Secondary | ICD-10-CM | POA: Diagnosis not present

## 2019-06-17 LAB — POCT INR: INR: 2.4 (ref 2.0–3.0)

## 2019-06-17 MED ORDER — WARFARIN SODIUM 5 MG PO TABS: ORAL_TABLET | ORAL | 2 refills | Status: DC

## 2019-06-17 NOTE — Addendum Note (Signed)
Addended by: Daisy Blossom, Annie Main L on: 06/17/2019 11:45 AM   Modules accepted: Orders

## 2019-07-16 ENCOUNTER — Telehealth: Payer: Self-pay | Admitting: *Deleted

## 2019-07-16 NOTE — Telephone Encounter (Signed)
Medical Assistant left message on patient's home and cell voicemail. Voicemail states to give a call back to [place] with [facility] at [phone]. Patient is aware of 07/20/2019 visit being in person. MA attempted to screen patient for any URI or GI symptoms to confirm in person visit is safe.

## 2019-07-19 NOTE — Progress Notes (Signed)
Subjective:    Patient ID: Susan Warren, female    DOB: 05/11/1976, 43 y.o.   MRN: [id number]  43 y.o.F with VTE PE/DVT  This is a follow-up visit from pre-existing visits that occurred in April and  May 2020  The patient had initially been admitted April 6 after previously fracturing her right foot in March.  See below documentation from prior visits in April     She was found during the initial admission which lasted until 13 April significant bilateral pulmonary emboli with minimal perfusion.  Ultrasound of the right leg did not show blood clots.  The patient was given intra-arterial TPA to the pulmonary arteries with interventional radiology and then given heparin IV and subsequently overlapped with warfarin.  The patient was discharged on the warfarin and an improved situation with improved oxygenation.  The patient then returned to the hospital on 19 April with increased pain in the right lower extremity.  A venogram of the right lower extremity at that point showed deep venous thrombosis in the right leg.  The patient was maintained on the warfarin consultation with vascular surgery was obtained and they did not recommend any other interventions.  Also the patient was found to have a large cyst in the liver with compression of the inferior vena cava.  General surgery was consulted and they recommended observation alone.   Patient was seen when she was in the hospital by vascular surgery Dr. fields saw her and did not have any thing that he could offer her.  She could not tolerate lytic therapy because of her morbid obesity greater than 500 pounds.  [facility] D/C summary: Admit date: 04/11/2019 Discharge date: 04/14/2019 Discharge Diagnoses:  Principal Problem:   Right leg swelling Active Problems:   Hypertension   PE (pulmonary thromboembolism) (HCC)   Right leg DVT (HCC)   Hepatic cyst   Lymphedema of both lower extremities   Obesity, Class III, BMI 40-49.9 (morbid obesity)  (HCC)   Discharge Condition: Stable and improved.  Patient discharged home with instruction to follow-up with PCP and general surgery as an outpatient.  Diet recommendation: low calorie/healthy diet   Filed Weights  04/11/19 2212 04/12/19 0708 Weight: (!) 208.7 kg (!) 222 kg   History of present illness:  As per H&P written by Dr. Odie Sera on 04/12/2019 43 y.o.femalewith medical history significant forobesity (BMI 79), hypertension, and PE on warfarin, now presenting to the emergency department for evaluation of progressive pain and swelling involving the right leg.Patient reports that the right leg pain and swelling began approximately 2 weeks ago when she was in the hospital and being managed for massive bilateral PE. Pain has slowly progressed since then and she has also developed progressive edema. Pain is mainly in the proximal and anteromedial aspect of the leg. She reports that her chronic left leg swelling is at baseline. She denies any fevers or chills, denies any cough, shortness of breath, or chest pain, and denies any melena or hematochezia. She has not noticed much redness involving the right leg and has not noted any wounds or boils or drainage.  ED Course:Upon arrival to the ED, patient is found to be saturating well on room air and with other vitals also stable. Chemistry panel notable for mild chronic hyponatremia and CBC features a slight leukocytosis. Lactic acid is reassuringly normal and INR is therapeutic at 2.9. CT venogram was performed with concern for right femoral vein, profundus femoris vein, and possibly right popliteal vein  DVT, as well as a large exophytic cyst arising from the inferior liver and extending into the upper pelvis causing mass-effect and compression of the IVC. Vascular surgery was consulted by the ED physician and recommended a medical admission.  Hospital Course:  1-right leg swelling and pain: due to DVT (involving femoral,  profundus femoris and popliteal vein). -Patient evaluated by vascular surgery who at this moment recommending no thrombolyzes or placement of IVC filter. -Continue anticoagulation -Drastic weight loss -Outpatient follow-up with general surgery (to assist with ongoing liver cyst and by that hopefully overall improvement in venous flow) -INR is therapeutic   2-PE -stable -no SOB or CP -good O2 sat on RA -continue anticoagulation.  3-HTN -continue norvasc -heart healthy diet recommended  4-hyponatremia -resolved with IVF's resuscitation -patient advise to maintain adequate hydration  5-hepatic cyst/occlusion IVC -continue anticoagulation -outpatient follow up with general surgery  6-morbid obesity -Body mass index is 84.01 kg/m. -lifestyle changes discussed -patient will benefit of aggressive weight loss. -outpatient follow up with bariatric clinic recommended   The patient overall is improved at this time.  The pain in the right lower extremity is markedly better.  Lymphedema is improved as well.  She was working half days and now is ready to go to full-time basis  Note today her INR is 1.8 so she received extra doses of warfarin per the warfarin pharmacist who is managing her  There is no shortness of breath or chest pain noted   Past Medical History:  Diagnosis Date   Allergy    Anxiety    Depression    DVT (deep venous thrombosis) (HCC)    GERD (gastroesophageal reflux disease)    Hepatic cyst    Hypertension    Lymphedema of both lower extremities    Migraines    Morbid obesity with BMI of 70 and over, adult [facility])    PE (pulmonary thromboembolism) (HCC) 03/2019   Sleep apnea      Family History  Problem Relation Age of Onset   Hypertension Mother    Thyroid disease Mother    Hypertension Maternal Grandmother    Diabetes Maternal Grandmother    Breast cancer Maternal Grandmother    Stroke Maternal Grandmother    Heart attack  Neg Hx      Social History   Socioeconomic History   Marital status: Married    Spouse name: Not on file   Number of children: Not on file   Years of education: 12   Highest education level: Not on file  Occupational History   Occupation: Admin.    Employer: [employer] strain: Not on file   Food insecurity    Worry: Not on file    Inability: Not on file   Transportation needs    Medical: Not on file    Non-medical: Not on file  Tobacco Use   Smoking status: Never Smoker   Smokeless tobacco: Never Used  Substance and Sexual Activity   Alcohol use: Yes    Comment: occ   Drug use: No   Sexual activity: Not Currently    Birth control/protection: I.U.D.  Lifestyle   Physical activity    Days per week: Not on file    Minutes per session: Not on file   Stress: Not on file  Relationships   Social connections    Talks on phone: Not on file    Gets together: Not on file    Attends religious service:  Not on file    Active member of club or organization: Not on file    Attends meetings of clubs or organizations: Not on file    Relationship status: Not on file   Intimate partner violence    Fear of current or ex partner: Not on file    Emotionally abused: Not on file    Physically abused: Not on file    Forced sexual activity: Not on file  Other Topics Concern   Not on file  Social History Narrative   Married, 1 child     Allergies  Allergen Reactions   Adhesive [Tape] Anaphylaxis and Swelling    *Adhesive Spray*      Outpatient Medications Prior to Visit  Medication Sig Dispense Refill   acetaminophen (TYLENOL) 325 MG tablet Take 2 tablets (650 mg total) by mouth every 6 (six) hours as needed for mild pain or headache (or Fever >/= 101). 40 tablet 0   warfarin (COUMADIN) 5 MG tablet Take 1 tablet by mouth on Mondays and Fridays with 1/2 tablet all other days. 30 tablet 2   amLODipine (NORVASC) 5 MG  tablet Take 1 tablet (5 mg total) by mouth daily. 30 tablet 3   docusate sodium (COLACE) 100 MG capsule Take 1 capsule (100 mg total) by mouth 2 (two) times daily as needed for moderate constipation. (Patient not taking: Reported on 04/15/2019) 30 capsule 2   polyethylene glycol (MIRALAX / GLYCOLAX) 17 g packet Take 17 g by mouth daily as needed for mild constipation. (Patient not taking: Reported on 04/15/2019) 28 each 1   No facility-administered medications prior to visit.      Review of Systems  Constitutional: Negative for fatigue and fever.  HENT: Negative for ear discharge, ear pain, sinus pressure and trouble swallowing.   Respiratory: Negative for cough, chest tightness and shortness of breath.   Cardiovascular: Positive for leg swelling. Negative for chest pain and palpitations.  Gastrointestinal: Negative.   Genitourinary: Negative.   Neurological: Negative for dizziness, tremors, seizures, syncope, weakness and headaches.  Psychiatric/Behavioral: Negative.  The patient is not nervous/anxious.        Objective:   Physical Exam Vitals:   07/20/19 1531  BP: (!) 166/95  Pulse: 94  Temp: 99 F (37.2 C)  TempSrc: Oral  SpO2: 96%  Weight: (!) 490 lb 12.8 oz (222.6 kg)  Height: 5\' 4"  (1.626 m)   Wt Readings from Last 3 Encounters:  07/20/19 (!) 490 lb 12.8 oz (222.6 kg)  05/18/19 (!) 505 lb (229.1 kg)  04/15/19 (!) 489 lb 6.4 oz (222 kg)   Gen: Pleasant, morbidly obese  in no distress,  normal affect  ENT: No lesions,  mouth clear,  oropharynx clear, no postnasal drip  Neck: No JVD, no TMG, no carotid bruits  Lungs: No use of accessory muscles, no dullness to percussion, clear without rales or rhonchi  Cardiovascular: RRR, heart sounds normal, no murmur or gallops, brawny edema RLE particularly involving the thigh but also the lower extremities and this has improved significantly from the last visit  Abdomen: soft and NT, no HSM,  BS normal  Musculoskeletal: No  deformities, no cyanosis or clubbing  Neuro: alert, non focal  Skin: Warm, no lesions or rashes  Lab Results  Component Value Date   INR 1.8 (A) 07/20/2019   INR 2.4 06/17/2019   INR 1.7 (A) 05/27/2019     Procedures/Studies: ImagingResults  Ct Angio Chest Pe W/cm &/or Wo Cm  Result Date:  03/29/2019 CLINICAL DATA:  Shortness of breath. EXAM: CT ANGIOGRAPHY CHEST WITH CONTRAST TECHNIQUE: Multidetector CT imaging of the chest was performed using the standard protocol during bolus administration of intravenous contrast. Multiplanar CT image reconstructions and MIPs were obtained to evaluate the vascular anatomy. CONTRAST:  OMNIPAQUE IOHEXOL 350 MG/ML SOLN COMPARISON:  Chest x-ray dated 03/29/2019 and CT scan dated 02/05/2017 FINDINGS: Cardiovascular: There are massive bilateral pulmonary emboli. There is almost complete obstruction of the pulmonary arteries in the left lung with a tiny amount of flow into the left upper lobe. There is only minimal flow into the pulmonary arteries of the right lung due to the massive emboli. RV/LV ratio is 2.6 indicating marked right heart strain. Mediastinum/Nodes: No enlarged mediastinal, hilar, or axillary lymph nodes. Thyroid gland, trachea, and esophagus demonstrate no significant findings. Lungs/Pleura: Lungs are clear. No pleural effusion or pneumothorax. Upper Abdomen: Normal. Musculoskeletal: No chest wall abnormality. No acute or significant osseous findings. Review of the MIP images confirms the above findings. IMPRESSION: 1. Massive bilateral pulmonary emboli with minimal perfusion to both lungs. 2. Markedly abnormal RV/LV ratio of 2.6. Critical Value/emergent results were called by telephone at the time of interpretation on 03/29/2019 at 12:30 pm to [facility], PA-C , who verbally acknowledged these results. Electronically Signed   By: Francene Boyers M.D.   On: 03/29/2019 12:32   Ir US Guide Vasc Access Right  Result Date:  03/29/2019 INDICATION: Acute massive bilateral pulmonary emboli with significant right heart strain. EXAM: 1. ULTRASOUND GUIDANCE FOR VENOUS ACCESS X2 2. BILATERAL PULMONARY ARTERIOGRAPHY 3. FLUOROSCOPIC GUIDED PLACEMENT OF BILATERAL PULMONARY ARTERIAL LYTIC INFUSION CATHETERS COMPARISON:  CTA 03/29/2019 MEDICATIONS: Intravenous Fentanyl and Versed 1mg  were administered as conscious sedation during continuous monitoring of the patient's level of consciousness and physiological / cardiorespiratory status by the radiology RN, with a total moderate sedation time of 20 minutes. CONTRAST:  None FLUOROSCOPY TIME:  4 minutes 30 seconds; 65 mGy COMPLICATIONS: None immediate TECHNIQUE: Informed written consent was obtained from the patient after a discussion of the risks, benefits and alternatives to treatment. Questions regarding the procedure were encouraged and answered. A timeout was performed prior to the initiation of the procedure. Ultrasound scanning was performed demonstrating patency of the right internal jugular vein which was selected for vascular access. The right neck was prepped and draped in the usual sterile fashion, and a sterile drape was applied covering the operative field. Maximum barrier sterile technique with sterile gowns and gloves were used for the procedure. A timeout was performed prior to the initiation of the procedure. Local anesthesia was provided with 1% lidocaine. Under direct ultrasound guidance, the right internal jugular vein was accessed with a micro puncture sheath ultimately allowing placement of a 6 French vascular sheath. Slightly cranial to this initial access, the right internal jugular vein was again accessed with a micropuncture sheath ultimately allowing placement of a 6 French vascular sheath. With the use of a Benson wire, a 5 French angled pigtail catheter was advanced into the right main pulmonary artery. Over an exchange length Rosen wire, the pigtail catheter was  exchanged for a 105/18 cm multi side-hole EKOS ultrasound assisted infusion catheter. With the use of a Bentson wire, via the second sheath a 5 French pigtail catheter was advanced into the left main pulmonary artery . Over an exchange length Rosen wire, the pigtail catheter was exchanged for a 105/12 cm multi side-hole EKOS ultrasound assisted infusion catheter. A postprocedural fluoroscopic image was obtained of the  check demonstrating final catheter positioning. Following the procedure, both ultrasound assisted infusion catheter tips terminate within the distal aspects of the bilateral lower lobe sub segmental pulmonary arteries. Both vascular sheath were secured at the right neck with interrupted 0 Prolene sutures. The external catheter tubing was secured at the right chest and the lytic therapy was initiated. The patient tolerated the procedure well without immediate postprocedural complication. IMPRESSION: 1. Successful fluoroscopic guided initiation of bilateral ultrasound assisted catheter directed pulmonary arterial lysis for acute bilateral massive pulmonary embolism and right-sided heart strain. PLAN: -ICU observation during infusion -After at least 12 hours infusion, obtain pressure measurements and either removal of the catheters or continuation of the catheter directed thrombolysis. -consider lower extremity Doppler to exclude significant residual DVT Electronically Signed   By: Lucrezia Europe M.D.   On: 03/29/2019 16:28   Dg Chest Port 1 View  Result Date: 03/31/2019 CLINICAL DATA:  Shortness of Breath EXAM: PORTABLE CHEST 1 VIEW COMPARISON:  03/29/2019 FINDINGS: Cardiac shadow is stable. The lungs are well aerated bilaterally. No focal infiltrate or sizable effusion is seen. No acute bony abnormality is noted. IMPRESSION: No active disease. Electronically Signed   By: Inez Catalina M.D.   On: 03/31/2019 08:22   Dg Chest Portable 1 View  Result Date: 03/29/2019 CLINICAL DATA:  Onset shortness of  breath and tachycardia 3 hours ago. EXAM: PORTABLE CHEST 1 VIEW COMPARISON:  CT chest and PA and lateral chest 02/05/2017. FINDINGS: The lungs are clear. Heart size is normal. No pneumothorax or pleural fluid. No acute or focal bony abnormality. IMPRESSION: Negative chest. Electronically Signed   By: Inge Rise M.D.   On: 03/29/2019 10:32   Vas [place] Lower Extremity Venous (dvt)  Result Date: 03/30/2019  Lower Venous Study Indications: Pulmonary embolism.  Limitations: Body habitus and poor ultrasound/tissue interface. Performing Technologist: Oliver Hum RVT  Examination Guidelines: A complete evaluation includes B-mode imaging, spectral Doppler, color Doppler, and power Doppler as needed of all accessible portions of each vessel. Bilateral testing is considered an integral part of a complete examination. Limited examinations for reoccurring indications may be performed as noted.  Right Venous Findings: +---------+---------------+---------+-----------+----------+--------------+            Compressibility Phasicity Spontaneity Properties Summary         +---------+---------------+---------+-----------+----------+--------------+  CFV       Full            No        No                                     +---------+---------------+---------+-----------+----------+--------------+  SFJ       Full                                                             +---------+---------------+---------+-----------+----------+--------------+  FV Prox   Full                                                             +---------+---------------+---------+-----------+----------+--------------+  FV  Mid                                                     Not visualized  +---------+---------------+---------+-----------+----------+--------------+  FV Distal                                                  Not visualized  +---------+---------------+---------+-----------+----------+--------------+  PFV                                                         Not visualized  +---------+---------------+---------+-----------+----------+--------------+  POP                                                        Not visualized  +---------+---------------+---------+-----------+----------+--------------+  PTV                                                        Not visualized  +---------+---------------+---------+-----------+----------+--------------+  PERO                                                       Not visualized  +---------+---------------+---------+-----------+----------+--------------+  Left Venous Findings: +---------+---------------+---------+-----------+----------+--------------+            Compressibility Phasicity Spontaneity Properties Summary         +---------+---------------+---------+-----------+----------+--------------+  CFV       Full            Yes       Yes                                    +---------+---------------+---------+-----------+----------+--------------+  SFJ       Full                                                             +---------+---------------+---------+-----------+----------+--------------+  FV Prox   Full                                                             +---------+---------------+---------+-----------+----------+--------------+  FV Mid    Full                                                             +---------+---------------+---------+-----------+----------+--------------+  FV Distal                                                  Not visualized  +---------+---------------+---------+-----------+----------+--------------+  PFV                                                        Not visualized  +---------+---------------+---------+-----------+----------+--------------+  POP       Full            Yes       No                                     +---------+---------------+---------+-----------+----------+--------------+  PTV                                                         Not visualized  +---------+---------------+---------+-----------+----------+--------------+  PERO                                                       Not visualized  +---------+---------------+---------+-----------+----------+--------------+    Summary: Right: There is no evidence of deep vein thrombosis in the lower extremity. However, portions of this examination were limited- see technologist comments above. No cystic structure found in the popliteal fossa. Left: There is no evidence of deep vein thrombosis in the lower extremity. However, portions of this examination were limited- see technologist comments above. No cystic structure found in the popliteal fossa.  *See table(s) above for measurements and observations. Electronically signed by Waverly Ferrarihristopher Dickson MD on 03/30/2019 at 5:18:24 PM.    Final    Ir Infusion Thrombol Arterial Initial (ms)  Result Date: 03/29/2019 INDICATION: Acute massive bilateral pulmonary emboli with significant right heart strain. EXAM: 1. ULTRASOUND GUIDANCE FOR VENOUS ACCESS X2 2. BILATERAL PULMONARY ARTERIOGRAPHY 3. FLUOROSCOPIC GUIDED PLACEMENT OF BILATERAL PULMONARY ARTERIAL LYTIC INFUSION CATHETERS COMPARISON:  CTA 03/29/2019 MEDICATIONS: Intravenous Fentanyl 50mcg and Versed 1mg  were administered as conscious sedation during continuous monitoring of the patient's level of consciousness and physiological / cardiorespiratory status by the radiology RN, with a total moderate sedation time of 20 minutes. CONTRAST:  None FLUOROSCOPY TIME:  4 minutes 30 seconds; 65 mGy COMPLICATIONS: None immediate TECHNIQUE: Informed written consent was obtained from the patient after a discussion of the risks, benefits and alternatives to treatment. Questions regarding the procedure were encouraged and answered. A timeout was performed prior to the initiation of the procedure. Ultrasound scanning was performed demonstrating patency of the right internal jugular vein which was selected for  vascular access. The right neck was prepped and draped in the usual sterile fashion, and a sterile drape was applied covering the operative field. Maximum barrier sterile technique with sterile gowns and gloves were used for the procedure. A timeout was performed prior to the  initiation of the procedure. Local anesthesia was provided with 1% lidocaine. Under direct ultrasound guidance, the right internal jugular vein was accessed with a micro puncture sheath ultimately allowing placement of a 6 French vascular sheath. Slightly cranial to this initial access, the right internal jugular vein was again accessed with a micropuncture sheath ultimately allowing placement of a 6 French vascular sheath. With the use of a Benson wire, a 5 French angled pigtail catheter was advanced into the right main pulmonary artery. Over an exchange length Rosen wire, the pigtail catheter was exchanged for a 105/18 cm multi side-hole EKOS ultrasound assisted infusion catheter. With the use of a Bentson wire, via the second sheath a 5 French pigtail catheter was advanced into the left main pulmonary artery . Over an exchange length Rosen wire, the pigtail catheter was exchanged for a 105/12 cm multi side-hole EKOS ultrasound assisted infusion catheter. A postprocedural fluoroscopic image was obtained of the check demonstrating final catheter positioning. Following the procedure, both ultrasound assisted infusion catheter tips terminate within the distal aspects of the bilateral lower lobe sub segmental pulmonary arteries. Both vascular sheath were secured at the right neck with interrupted 0 Prolene sutures. The external catheter tubing was secured at the right chest and the lytic therapy was initiated. The patient tolerated the procedure well without immediate postprocedural complication. IMPRESSION: 1. Successful fluoroscopic guided initiation of bilateral ultrasound assisted catheter directed pulmonary arterial lysis for acute bilateral  massive pulmonary embolism and right-sided heart strain. PLAN: -ICU observation during infusion -After at least 12 hours infusion, obtain pressure measurements and either removal of the catheters or continuation of the catheter directed thrombolysis. -consider lower extremity Doppler to exclude significant residual DVT Electronically Signed   By: Corlis Leak M.D.   On: 03/29/2019 16:28   Ir Infusion Thrombol Arterial Initial (ms)  Result Date: 03/29/2019 INDICATION: Acute massive bilateral pulmonary emboli with significant right heart strain. EXAM: 1. ULTRASOUND GUIDANCE FOR VENOUS ACCESS X2 2. BILATERAL PULMONARY ARTERIOGRAPHY 3. FLUOROSCOPIC GUIDED PLACEMENT OF BILATERAL PULMONARY ARTERIAL LYTIC INFUSION CATHETERS COMPARISON:  CTA 03/29/2019 MEDICATIONS: Intravenous Fentanyl and Versed  were administered as conscious sedation during continuous monitoring of the patient's level of consciousness and physiological / cardiorespiratory status by the radiology RN, with a total moderate sedation time of 20 minutes. CONTRAST:  None FLUOROSCOPY TIME:  4 minutes 30 seconds; 65 mGy COMPLICATIONS: None immediate TECHNIQUE: Informed written consent was obtained from the patient after a discussion of the risks, benefits and alternatives to treatment. Questions regarding the procedure were encouraged and answered. A timeout was performed prior to the initiation of the procedure. Ultrasound scanning was performed demonstrating patency of the right internal jugular vein which was selected for vascular access. The right neck was prepped and draped in the usual sterile fashion, and a sterile drape was applied covering the operative field. Maximum barrier sterile technique with sterile gowns and gloves were used for the procedure. A timeout was performed prior to the initiation of the procedure. Local anesthesia was provided with 1% lidocaine. Under direct ultrasound guidance, the right internal jugular vein was accessed  with a micro puncture sheath ultimately allowing placement of a 6 French vascular sheath. Slightly cranial to this initial access, the right internal jugular vein was again accessed with a micropuncture sheath ultimately allowing placement of a 6 French vascular sheath. With the use of a Benson wire, a 5 French angled pigtail catheter was advanced into the right main pulmonary artery. Over an exchange length [employer],  the pigtail catheter was exchanged for a 105/18 cm multi side-hole EKOS ultrasound assisted infusion catheter. With the use of a Bentson wire, via the second sheath a 5 French pigtail catheter was advanced into the left main pulmonary artery . Over an exchange length Rosen wire, the pigtail catheter was exchanged for a 105/12 cm multi side-hole EKOS ultrasound assisted infusion catheter. A postprocedural fluoroscopic image was obtained of the check demonstrating final catheter positioning. Following the procedure, both ultrasound assisted infusion catheter tips terminate within the distal aspects of the bilateral lower lobe sub segmental pulmonary arteries. Both vascular sheath were secured at the right neck with interrupted 0 Prolene sutures. The external catheter tubing was secured at the right chest and the lytic therapy was initiated. The patient tolerated the procedure well without immediate postprocedural complication. IMPRESSION: 1. Successful fluoroscopic guided initiation of bilateral ultrasound assisted catheter directed pulmonary arterial lysis for acute bilateral massive pulmonary embolism and right-sided heart strain. PLAN: -ICU observation during infusion -After at least 12 hours infusion, obtain pressure measurements and either removal of the catheters or continuation of the catheter directed thrombolysis. -consider lower extremity Doppler to exclude significant residual DVT Electronically Signed   By: Corlis Leak  Hassell M.D.   On: 03/29/2019 16:28   Ir Rande Lawmanhromb F/u Eval Art/ven Final Day  (ms)  Result Date: 03/30/2019 INDICATION: History of acute bilateral massive pulmonary embolism, post initiation of bilateral pulmonary arterial thrombolysis on 03/29/2019. Patient has completed 12 hours of bilateral pulmonary arterial lytic infusion and is seen in the ICU for repeat pulmonary arterial pressure measurements. EXAM: PULMONARY ARTERIAL PRESSURE MEASUREMENT AND PULMONARY INFUSION CATHETER REMOVAL COMPARISON:  Initiation of bilateral pulmonary arterial catheter directed thrombolysis-03/29/2019; chest CTA 03/29/2019 MEDICATIONS: None CONTRAST:  None required FLUOROSCOPY TIME:  None utilized COMPLICATIONS: None immediate. TECHNIQUE: The patient was positioned supine . Pulmonary arterial pressure min measurements via indwelling pulmonary arterial catheters, 51/27 (37) mmHg. At this point, the procedure was terminated. All wires, catheters and sheaths were removed from the patient. Hemostasis was achieved at the right IJ access sites with manual compression. A dressing was placed. The patient tolerated the procedure well without immediate postprocedural complication. IMPRESSION: Technically successful bilateral catheter directed pulmonary arterial thrombolysis with mild-moderate residual pulmonary hypertension. Electronically Signed   By: Corlis Leak  Hassell M.D.   On: 03/30/2019 12:46         Assessment & Plan:  I personally reviewed all [facility] as well as any outside material available during this office visit and agree with the  radiology impressions.   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  Diagnoses and all orders for this visit:  PE (pulmonary thromboembolism) (HCC) -     INR  Acute deep vein thrombosis (DVT) of femoral vein of right lower extremity (HCC)  History of pulmonary embolism  Essential hypertension  Obesity, morbid, BMI 50 or higher (HCC)  Other orders -     amLODipine (NORVASC) 10 MG  tablet; Take 1 tablet (10 mg total) by mouth daily.

## 2019-07-20 ENCOUNTER — Other Ambulatory Visit: Payer: Self-pay

## 2019-07-20 ENCOUNTER — Ambulatory Visit: Payer: 59 | Attending: Critical Care Medicine | Admitting: Critical Care Medicine

## 2019-07-20 ENCOUNTER — Encounter: Payer: Self-pay | Admitting: Critical Care Medicine

## 2019-07-20 ENCOUNTER — Ambulatory Visit: Payer: 59 | Admitting: Pharmacist

## 2019-07-20 VITALS — BP 166/95 | HR 94 | Temp 99.0°F | Ht 64.0 in | Wt >= 6400 oz

## 2019-07-20 DIAGNOSIS — I10 Essential (primary) hypertension: Secondary | ICD-10-CM

## 2019-07-20 DIAGNOSIS — I82411 Acute embolism and thrombosis of right femoral vein: Secondary | ICD-10-CM | POA: Diagnosis not present

## 2019-07-20 DIAGNOSIS — I2699 Other pulmonary embolism without acute cor pulmonale: Secondary | ICD-10-CM | POA: Diagnosis not present

## 2019-07-20 DIAGNOSIS — Z86711 Personal history of pulmonary embolism: Secondary | ICD-10-CM

## 2019-07-20 LAB — POCT INR: INR: 1.8 — AB (ref 2.0–3.0)

## 2019-07-20 MED ORDER — AMLODIPINE BESYLATE 10 MG PO TABS: 10.0000 mg | ORAL_TABLET | Freq: Every day | ORAL | 3 refills | Status: DC

## 2019-07-20 NOTE — Assessment & Plan Note (Signed)
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

## 2019-07-20 NOTE — Patient Instructions (Addendum)
From [facility]:  Take 1 full tablet today, then continue normal schedule of 1/2 tablet every day except for 1 tablet on Mondays and Fridays. I will see you in 2 weeks. Please make that appointment when you check out.   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

## 2019-07-20 NOTE — Assessment & Plan Note (Signed)
History of massive pulmonary embolism now resolved  The patient will stay on warfarin for life

## 2019-07-20 NOTE — Assessment & Plan Note (Signed)
Note hypertension has significantly worsened on the amlodipine 5 mg daily  Plan will be to increase amlodipine to 10 mg daily

## 2019-07-22 ENCOUNTER — Ambulatory Visit: Payer: 59 | Admitting: Pharmacist

## 2019-07-22 ENCOUNTER — Ambulatory Visit: Payer: 59 | Admitting: Critical Care Medicine

## 2019-08-05 ENCOUNTER — Other Ambulatory Visit: Payer: Self-pay

## 2019-08-05 ENCOUNTER — Ambulatory Visit: Payer: 59 | Attending: Critical Care Medicine | Admitting: Pharmacist

## 2019-08-05 DIAGNOSIS — I82411 Acute embolism and thrombosis of right femoral vein: Secondary | ICD-10-CM | POA: Diagnosis not present

## 2019-08-05 LAB — POCT INR: INR: 2.2 (ref 2.0–3.0)

## 2019-09-02 ENCOUNTER — Ambulatory Visit: Payer: 59 | Attending: Family Medicine | Admitting: Pharmacist

## 2019-09-02 ENCOUNTER — Other Ambulatory Visit: Payer: Self-pay

## 2019-09-02 DIAGNOSIS — I82411 Acute embolism and thrombosis of right femoral vein: Secondary | ICD-10-CM

## 2019-09-02 LAB — POCT INR: INR: 1.6 — AB (ref 2.0–3.0)

## 2019-09-16 ENCOUNTER — Ambulatory Visit: Payer: 59 | Admitting: Pharmacist

## 2019-09-20 ENCOUNTER — Ambulatory Visit: Payer: 59 | Attending: Critical Care Medicine | Admitting: Pharmacist

## 2019-09-20 ENCOUNTER — Other Ambulatory Visit: Payer: Self-pay

## 2019-09-20 DIAGNOSIS — I82411 Acute embolism and thrombosis of right femoral vein: Secondary | ICD-10-CM | POA: Diagnosis not present

## 2019-09-20 LAB — POCT INR: INR: 1.8 — AB (ref 2.0–3.0)

## 2019-09-20 MED ORDER — WARFARIN SODIUM 5 MG PO TABS: ORAL_TABLET | ORAL | 2 refills | Status: DC

## 2019-09-20 NOTE — Addendum Note (Signed)
Addended by: Daisy Blossom, STEPHEN L on: 09/20/2019 03:00 PM   Modules accepted: Orders

## 2019-09-27 ENCOUNTER — Ambulatory Visit: Payer: 59 | Attending: Family Medicine | Admitting: Pharmacist

## 2019-09-27 ENCOUNTER — Other Ambulatory Visit: Payer: Self-pay

## 2019-09-27 DIAGNOSIS — I82411 Acute embolism and thrombosis of right femoral vein: Secondary | ICD-10-CM | POA: Diagnosis not present

## 2019-09-27 LAB — POCT INR: INR: 1.8 — AB (ref 2.0–3.0)

## 2019-10-11 ENCOUNTER — Ambulatory Visit: Payer: 59 | Attending: Family Medicine | Admitting: Pharmacist

## 2019-10-11 ENCOUNTER — Other Ambulatory Visit: Payer: Self-pay

## 2019-10-11 DIAGNOSIS — I82411 Acute embolism and thrombosis of right femoral vein: Secondary | ICD-10-CM

## 2019-10-11 LAB — POCT INR: INR: 2.2 (ref 2.0–3.0)

## 2019-11-08 ENCOUNTER — Encounter: Payer: 59 | Admitting: Pharmacist

## 2019-11-15 ENCOUNTER — Other Ambulatory Visit: Payer: Self-pay

## 2019-11-15 ENCOUNTER — Ambulatory Visit: Payer: 59 | Attending: Critical Care Medicine | Admitting: Pharmacist

## 2019-11-15 DIAGNOSIS — I82411 Acute embolism and thrombosis of right femoral vein: Secondary | ICD-10-CM

## 2019-11-15 LAB — POCT INR: INR: 1.8 — AB (ref 2.0–3.0)

## 2019-11-15 MED ORDER — WARFARIN SODIUM 5 MG PO TABS: ORAL_TABLET | ORAL | 2 refills | Status: DC

## 2019-11-15 NOTE — Addendum Note (Signed)
Addended by: Daisy Blossom, Annie Main L on: 11/15/2019 01:51 PM   Modules accepted: Orders

## 2019-11-25 ENCOUNTER — Ambulatory Visit: Payer: 59 | Admitting: Critical Care Medicine

## 2019-11-30 ENCOUNTER — Ambulatory Visit: Payer: 59 | Admitting: Critical Care Medicine

## 2019-12-06 ENCOUNTER — Other Ambulatory Visit: Payer: Self-pay

## 2019-12-06 ENCOUNTER — Ambulatory Visit (HOSPITAL_BASED_OUTPATIENT_CLINIC_OR_DEPARTMENT_OTHER): Payer: 59 | Admitting: Pharmacist

## 2019-12-06 DIAGNOSIS — I82411 Acute embolism and thrombosis of right femoral vein: Secondary | ICD-10-CM | POA: Diagnosis not present

## 2019-12-06 LAB — POCT INR: INR: 2.3 (ref 2.0–3.0)

## 2019-12-13 ENCOUNTER — Other Ambulatory Visit: Payer: Self-pay | Admitting: Critical Care Medicine

## 2019-12-13 DIAGNOSIS — I82411 Acute embolism and thrombosis of right femoral vein: Secondary | ICD-10-CM

## 2019-12-14 ENCOUNTER — Ambulatory Visit: Payer: 59 | Admitting: Critical Care Medicine

## 2020-01-03 ENCOUNTER — Other Ambulatory Visit: Payer: Self-pay

## 2020-01-03 ENCOUNTER — Ambulatory Visit: Payer: 59 | Attending: Critical Care Medicine | Admitting: Pharmacist

## 2020-01-03 DIAGNOSIS — I82411 Acute embolism and thrombosis of right femoral vein: Secondary | ICD-10-CM | POA: Diagnosis not present

## 2020-01-03 LAB — POCT INR: INR: 2.7 (ref 2.0–3.0)

## 2020-01-31 ENCOUNTER — Ambulatory Visit: Payer: 59 | Admitting: Pharmacist

## 2020-02-04 ENCOUNTER — Ambulatory Visit: Payer: 59 | Admitting: Pharmacist

## 2020-02-07 ENCOUNTER — Ambulatory Visit: Payer: 59 | Admitting: Pharmacist

## 2020-02-14 ENCOUNTER — Ambulatory Visit: Payer: 59 | Attending: Critical Care Medicine | Admitting: Pharmacist

## 2020-02-14 ENCOUNTER — Other Ambulatory Visit: Payer: Self-pay

## 2020-02-14 DIAGNOSIS — I82411 Acute embolism and thrombosis of right femoral vein: Secondary | ICD-10-CM

## 2020-02-14 LAB — POCT INR: INR: 2.2 (ref 2.0–3.0)

## 2020-03-13 ENCOUNTER — Ambulatory Visit: Payer: 59 | Admitting: Pharmacist

## 2020-03-17 ENCOUNTER — Ambulatory Visit: Payer: 59 | Attending: Critical Care Medicine | Admitting: Pharmacist

## 2020-03-17 ENCOUNTER — Other Ambulatory Visit: Payer: Self-pay

## 2020-03-17 DIAGNOSIS — I82411 Acute embolism and thrombosis of right femoral vein: Secondary | ICD-10-CM | POA: Diagnosis not present

## 2020-03-17 LAB — POCT INR: INR: 2.3 (ref 2.0–3.0)

## 2020-04-21 ENCOUNTER — Ambulatory Visit: Payer: 59 | Admitting: Pharmacist

## 2020-05-01 ENCOUNTER — Ambulatory Visit: Payer: 59 | Attending: Critical Care Medicine | Admitting: Pharmacist

## 2020-05-01 ENCOUNTER — Other Ambulatory Visit: Payer: Self-pay

## 2020-05-01 DIAGNOSIS — I82411 Acute embolism and thrombosis of right femoral vein: Secondary | ICD-10-CM | POA: Diagnosis not present

## 2020-05-01 LAB — POCT INR: INR: 2.6 (ref 2.0–3.0)

## 2020-06-06 ENCOUNTER — Ambulatory Visit: Payer: 59 | Admitting: Pharmacist

## 2020-06-08 ENCOUNTER — Ambulatory Visit: Payer: 59 | Attending: Critical Care Medicine | Admitting: Pharmacist

## 2020-06-08 ENCOUNTER — Other Ambulatory Visit: Payer: Self-pay

## 2020-06-08 DIAGNOSIS — I82401 Acute embolism and thrombosis of unspecified deep veins of right lower extremity: Secondary | ICD-10-CM

## 2020-06-08 DIAGNOSIS — I82411 Acute embolism and thrombosis of right femoral vein: Secondary | ICD-10-CM

## 2020-06-08 LAB — POCT INR: INR: 2.5 (ref 2.0–3.0)

## 2020-06-08 MED ORDER — WARFARIN SODIUM 5 MG PO TABS: ORAL_TABLET | ORAL | 2 refills | Status: DC

## 2020-06-20 ENCOUNTER — Ambulatory Visit: Payer: 59 | Admitting: Pharmacist

## 2020-07-20 ENCOUNTER — Ambulatory Visit: Payer: 59 | Attending: Critical Care Medicine | Admitting: Pharmacist

## 2020-07-20 ENCOUNTER — Other Ambulatory Visit: Payer: Self-pay

## 2020-07-20 DIAGNOSIS — I82401 Acute embolism and thrombosis of unspecified deep veins of right lower extremity: Secondary | ICD-10-CM | POA: Diagnosis not present

## 2020-07-20 LAB — POCT INR: INR: 2.8 (ref 2.0–3.0)

## 2020-08-05 ENCOUNTER — Other Ambulatory Visit: Payer: Self-pay | Admitting: Critical Care Medicine

## 2020-08-05 NOTE — Telephone Encounter (Signed)
Requested medication (s) are due for refill today: yes  Requested medication (s) are on the active medication list: yes  Last refill:  07/20/19  Future visit scheduled: no  Notes to clinic:  called pt and LM on VM to call office to schedule appt   Requested Prescriptions  Pending Prescriptions Disp Refills   amLODipine (NORVASC) 10 MG tablet [Pharmacy Med Name: AMLODIPINE BESYLATE 10 MG TAB] 90 tablet 3    Sig: TAKE 1 TABLET BY MOUTH EVERY DAY      Cardiovascular:  Calcium Channel Blockers Failed - 08/05/2020  9:11 AM      Failed - Last BP in normal range    BP Readings from Last 1 Encounters:  07/20/19 (!) 166/95          Failed - Valid encounter within last 6 months    Recent Outpatient Visits           1 year ago PE (pulmonary thromboembolism) (HCC)   [facility]   1 year ago Acute deep vein thrombosis (DVT) of femoral vein of right lower extremity [facility])   [facility] [facility]   1 year ago PE (pulmonary thromboembolism) [facility])   [place] [facility]isk, [facility]

## 2020-08-17 ENCOUNTER — Ambulatory Visit: Payer: 59 | Attending: Critical Care Medicine | Admitting: Pharmacist

## 2020-08-17 ENCOUNTER — Other Ambulatory Visit: Payer: Self-pay

## 2020-08-17 DIAGNOSIS — I82411 Acute embolism and thrombosis of right femoral vein: Secondary | ICD-10-CM | POA: Diagnosis not present

## 2020-08-17 LAB — POCT INR: INR: 2.3 (ref 2.0–3.0)

## 2020-09-07 ENCOUNTER — Other Ambulatory Visit: Payer: Self-pay | Admitting: Critical Care Medicine

## 2020-09-07 NOTE — Telephone Encounter (Signed)
Requested  medications are  due for refill today yes  Requested medications are on the active medication list yes  Last refill 8/16  Future visit scheduled no  Notes to clinic Already had a curtesy refill by Dr. Delford Field, no visit is scheduled, please assess.

## 2020-09-21 ENCOUNTER — Ambulatory Visit: Payer: 59 | Admitting: Pharmacist

## 2020-09-29 ENCOUNTER — Other Ambulatory Visit: Payer: Self-pay

## 2020-09-29 ENCOUNTER — Ambulatory Visit: Payer: 59 | Attending: Critical Care Medicine | Admitting: Pharmacist

## 2020-09-29 DIAGNOSIS — I82511 Chronic embolism and thrombosis of right femoral vein: Secondary | ICD-10-CM

## 2020-09-29 LAB — POCT INR: INR: 2 (ref 2.0–3.0)

## 2020-11-08 ENCOUNTER — Other Ambulatory Visit: Payer: Self-pay | Admitting: Critical Care Medicine

## 2020-11-08 DIAGNOSIS — I82411 Acute embolism and thrombosis of right femoral vein: Secondary | ICD-10-CM

## 2020-11-08 NOTE — Telephone Encounter (Signed)
Requested medications are due for refill today yes  Requested medications are on the active medication list yes  Last refill 8/15  Last visit 09/2020  Future visit scheduled 11/13/2020  Notes to clinic Not Delegated.

## 2020-11-10 ENCOUNTER — Ambulatory Visit: Payer: 59 | Admitting: Pharmacist

## 2020-11-13 ENCOUNTER — Ambulatory Visit: Payer: 59 | Attending: Critical Care Medicine | Admitting: Pharmacist

## 2020-11-13 ENCOUNTER — Other Ambulatory Visit: Payer: Self-pay

## 2020-11-13 DIAGNOSIS — I82401 Acute embolism and thrombosis of unspecified deep veins of right lower extremity: Secondary | ICD-10-CM

## 2020-11-13 DIAGNOSIS — I82411 Acute embolism and thrombosis of right femoral vein: Secondary | ICD-10-CM

## 2020-11-13 LAB — POCT INR: INR: 2.7 (ref 2.0–3.0)

## 2020-11-13 MED ORDER — AMLODIPINE BESYLATE 10 MG PO TABS: 10.0000 mg | ORAL_TABLET | Freq: Every day | ORAL | 1 refills | Status: DC

## 2020-11-13 MED ORDER — WARFARIN SODIUM 5 MG PO TABS: ORAL_TABLET | ORAL | 0 refills | Status: DC

## 2020-12-08 ENCOUNTER — Other Ambulatory Visit: Payer: Self-pay | Admitting: Critical Care Medicine

## 2020-12-14 ENCOUNTER — Ambulatory Visit: Payer: 59 | Admitting: Pharmacist

## 2021-01-01 ENCOUNTER — Other Ambulatory Visit: Payer: Self-pay

## 2021-01-01 ENCOUNTER — Ambulatory Visit: Payer: 59 | Attending: Critical Care Medicine | Admitting: Pharmacist

## 2021-01-01 DIAGNOSIS — I82401 Acute embolism and thrombosis of unspecified deep veins of right lower extremity: Secondary | ICD-10-CM | POA: Diagnosis not present

## 2021-01-01 LAB — POCT INR: INR: 2.9 (ref 2.0–3.0)

## 2021-01-04 ENCOUNTER — Other Ambulatory Visit: Payer: Self-pay | Admitting: Critical Care Medicine

## 2021-01-04 NOTE — Telephone Encounter (Signed)
Requested medication (s) are due for refill today -yes  Requested medication (s) are on the active medication list -yes  Future visit scheduled -no  Last refill: 12/07/20  Notes to clinic: Attempted to call patient to schedule appointment with PCP- left message on VM to call office. Last provider appointment- over 1 year  Requested Prescriptions  Pending Prescriptions Disp Refills   amLODipine (NORVASC) 10 MG tablet [Pharmacy Med Name: AMLODIPINE BESYLATE 10 MG TAB] 30 tablet 1    Sig: TAKE 1 TABLET BY MOUTH EVERY DAY      Cardiovascular:  Calcium Channel Blockers Failed - 01/04/2021  1:32 PM      Failed - Last BP in normal range    BP Readings from Last 1 Encounters:  07/20/19 (!) 166/95          Failed - Valid encounter within last 6 months    Recent Outpatient Visits           1 year ago PE (pulmonary thromboembolism) (HCC)   [facility]isk, [facility]   1 year ago Acute deep vein thrombosis (DVT) of femoral vein of right lower extremity [facility])   [facility]isk, [facility]   1 year ago PE (pulmonary thromboembolism) [facility])   [place] [facility] [facility]isk, [facility]                    Requested Prescriptions  Pending Prescriptions Disp Refills   amLODipine (NORVASC) 10 MG tablet [Pharmacy Med Name: AMLODIPINE BESYLATE 10 MG TAB] 30 tablet 1    Sig: TAKE 1 TABLET BY MOUTH EVERY DAY      Cardiovascular:  Calcium Channel Blockers Failed - 01/04/2021  1:32 PM      Failed - Last BP in normal range    BP Readings from Last 1 Encounters:  07/20/19 (!) 166/95          Failed - Valid encounter within last 6 months    Recent Outpatient Visits           1 year ago PE (pulmonary thromboembolism) (HCC)   [place] [facility] Frisk, [facility]   1 year ago Acute deep vein thrombosis (DVT) of femoral vein of right lower extremity [facility])   [place]  [facility]   1 year ago PE (pulmonary thromboembolism) [facility])   [place] [facility]isk, MD

## 2021-02-01 ENCOUNTER — Ambulatory Visit: Payer: 59 | Admitting: Pharmacist

## 2021-02-09 ENCOUNTER — Ambulatory Visit: Payer: 59 | Attending: Critical Care Medicine | Admitting: Pharmacist

## 2021-02-09 ENCOUNTER — Other Ambulatory Visit: Payer: Self-pay

## 2021-02-09 DIAGNOSIS — I824Z1 Acute embolism and thrombosis of unspecified deep veins of right distal lower extremity: Secondary | ICD-10-CM | POA: Diagnosis not present

## 2021-02-09 LAB — POCT INR: INR: 3.1 — AB (ref 2.0–3.0)

## 2021-03-09 ENCOUNTER — Other Ambulatory Visit: Payer: Self-pay

## 2021-03-09 ENCOUNTER — Ambulatory Visit: Payer: 59 | Attending: Critical Care Medicine | Admitting: Pharmacist

## 2021-03-09 DIAGNOSIS — I82491 Acute embolism and thrombosis of other specified deep vein of right lower extremity: Secondary | ICD-10-CM | POA: Diagnosis not present

## 2021-03-09 LAB — POCT INR: INR: 3.6 — AB (ref 2.0–3.0)

## 2021-03-23 ENCOUNTER — Ambulatory Visit: Payer: 59 | Attending: Critical Care Medicine | Admitting: Pharmacist

## 2021-03-23 ENCOUNTER — Other Ambulatory Visit: Payer: Self-pay

## 2021-03-23 DIAGNOSIS — I82401 Acute embolism and thrombosis of unspecified deep veins of right lower extremity: Secondary | ICD-10-CM | POA: Diagnosis not present

## 2021-03-23 DIAGNOSIS — I82411 Acute embolism and thrombosis of right femoral vein: Secondary | ICD-10-CM

## 2021-03-23 LAB — POCT INR: INR: 2.8 (ref 2.0–3.0)

## 2021-03-23 MED ORDER — AMLODIPINE BESYLATE 10 MG PO TABS: 10.0000 mg | ORAL_TABLET | Freq: Every day | ORAL | 1 refills | Status: DC

## 2021-03-23 MED ORDER — WARFARIN SODIUM 5 MG PO TABS: ORAL_TABLET | ORAL | 1 refills | Status: DC

## 2021-04-09 ENCOUNTER — Ambulatory Visit: Payer: 59 | Admitting: Pharmacist

## 2021-04-13 ENCOUNTER — Ambulatory Visit: Payer: 59 | Attending: Critical Care Medicine | Admitting: Pharmacist

## 2021-04-13 ENCOUNTER — Other Ambulatory Visit: Payer: Self-pay

## 2021-04-13 DIAGNOSIS — I824Y1 Acute embolism and thrombosis of unspecified deep veins of right proximal lower extremity: Secondary | ICD-10-CM

## 2021-04-13 LAB — POCT INR: INR: 2.7 (ref 2.0–3.0)

## 2021-05-11 ENCOUNTER — Ambulatory Visit: Payer: 59 | Attending: Family Medicine | Admitting: Pharmacist

## 2021-05-31 ENCOUNTER — Other Ambulatory Visit: Payer: Self-pay

## 2021-05-31 ENCOUNTER — Ambulatory Visit: Payer: 59 | Attending: Family Medicine | Admitting: Pharmacist

## 2021-05-31 DIAGNOSIS — I82411 Acute embolism and thrombosis of right femoral vein: Secondary | ICD-10-CM

## 2021-05-31 DIAGNOSIS — I82401 Acute embolism and thrombosis of unspecified deep veins of right lower extremity: Secondary | ICD-10-CM

## 2021-05-31 LAB — POCT INR: INR: 2.9 (ref 2.0–3.0)

## 2021-05-31 MED ORDER — WARFARIN SODIUM 5 MG PO TABS: ORAL_TABLET | ORAL | 1 refills | Status: DC

## 2021-05-31 NOTE — Addendum Note (Signed)
Addended by: Lois Huxley, Ladana Chavero L on: 05/31/2021 11:10 AM   Modules accepted: Orders

## 2021-06-28 ENCOUNTER — Ambulatory Visit: Payer: 59 | Attending: Critical Care Medicine | Admitting: Pharmacist

## 2021-06-28 ENCOUNTER — Other Ambulatory Visit: Payer: Self-pay

## 2021-06-28 DIAGNOSIS — I82411 Acute embolism and thrombosis of right femoral vein: Secondary | ICD-10-CM

## 2021-06-28 LAB — POCT INR: INR: 2.5 (ref 2.0–3.0)

## 2021-08-02 ENCOUNTER — Other Ambulatory Visit: Payer: Self-pay

## 2021-08-02 ENCOUNTER — Ambulatory Visit: Payer: 59 | Attending: Critical Care Medicine | Admitting: Pharmacist

## 2021-08-02 DIAGNOSIS — I82411 Acute embolism and thrombosis of right femoral vein: Secondary | ICD-10-CM | POA: Diagnosis not present

## 2021-08-02 LAB — POCT INR: INR: 2.6 (ref 2.0–3.0)

## 2021-08-02 MED ORDER — AMLODIPINE BESYLATE 10 MG PO TABS: 10.0000 mg | ORAL_TABLET | Freq: Every day | ORAL | 1 refills | Status: DC

## 2021-08-02 MED ORDER — WARFARIN SODIUM 5 MG PO TABS: ORAL_TABLET | ORAL | 1 refills | Status: DC

## 2021-09-06 ENCOUNTER — Ambulatory Visit: Payer: 59 | Admitting: Pharmacist

## 2021-09-13 ENCOUNTER — Ambulatory Visit: Payer: 59 | Attending: Critical Care Medicine | Admitting: Pharmacist

## 2021-09-13 ENCOUNTER — Other Ambulatory Visit: Payer: Self-pay

## 2021-09-13 DIAGNOSIS — I82411 Acute embolism and thrombosis of right femoral vein: Secondary | ICD-10-CM | POA: Diagnosis not present

## 2021-09-13 LAB — POCT INR: INR: 2.5 (ref 2.0–3.0)

## 2021-09-14 ENCOUNTER — Ambulatory Visit: Payer: 59 | Admitting: Pharmacist

## 2021-10-11 ENCOUNTER — Ambulatory Visit: Payer: 59 | Admitting: Pharmacist

## 2021-10-17 ENCOUNTER — Other Ambulatory Visit: Payer: Self-pay

## 2021-10-17 ENCOUNTER — Ambulatory Visit: Payer: 59 | Attending: Critical Care Medicine | Admitting: Pharmacist

## 2021-10-17 DIAGNOSIS — I82411 Acute embolism and thrombosis of right femoral vein: Secondary | ICD-10-CM

## 2021-10-17 LAB — POCT INR: INR: 3.4 — AB (ref 2.0–3.0)

## 2021-11-09 ENCOUNTER — Ambulatory Visit: Payer: 59 | Admitting: Pharmacist

## 2021-11-29 ENCOUNTER — Ambulatory Visit: Payer: 59 | Attending: Family Medicine | Admitting: Pharmacist

## 2021-11-29 ENCOUNTER — Other Ambulatory Visit: Payer: Self-pay

## 2021-11-29 DIAGNOSIS — I82411 Acute embolism and thrombosis of right femoral vein: Secondary | ICD-10-CM | POA: Diagnosis not present

## 2021-11-29 LAB — POCT INR: INR: 2.2 (ref 2.0–3.0)

## 2022-01-03 ENCOUNTER — Telehealth: Payer: Self-pay | Admitting: Critical Care Medicine

## 2022-01-03 NOTE — Telephone Encounter (Signed)
Copied from [place] [phone]. Topic: Appointment Scheduling - Scheduling Inquiry for Clinic >> Jan 03, 2022 10:39 AM Erick Blinks wrote: Reason for CRM: Pt would like to reschedule her appt with Lurena Joiner, she wants to come sometime next week around 11 am on Wednesday, Thursday, or Friday. Please advise  Best contact: [phone] Return Pt call, LVM confirming the appt with [facility] 01/04/22

## 2022-01-04 ENCOUNTER — Ambulatory Visit: Payer: 59 | Admitting: Pharmacist

## 2022-01-09 ENCOUNTER — Other Ambulatory Visit: Payer: Self-pay

## 2022-01-09 ENCOUNTER — Ambulatory Visit: Payer: 59 | Attending: Critical Care Medicine | Admitting: Pharmacist

## 2022-01-09 DIAGNOSIS — I82411 Acute embolism and thrombosis of right femoral vein: Secondary | ICD-10-CM

## 2022-01-09 LAB — POCT INR: INR: 2.2 (ref 2.0–3.0)

## 2022-02-15 ENCOUNTER — Ambulatory Visit: Payer: 59 | Admitting: Pharmacist

## 2022-03-11 ENCOUNTER — Other Ambulatory Visit: Payer: Self-pay | Admitting: Critical Care Medicine

## 2022-03-12 ENCOUNTER — Other Ambulatory Visit: Payer: Self-pay

## 2022-03-12 ENCOUNTER — Ambulatory Visit: Payer: 59 | Attending: Critical Care Medicine | Admitting: Pharmacist

## 2022-03-12 DIAGNOSIS — I82411 Acute embolism and thrombosis of right femoral vein: Secondary | ICD-10-CM

## 2022-03-12 LAB — POCT INR: INR: 2.3 (ref 2.0–3.0)

## 2022-03-13 ENCOUNTER — Other Ambulatory Visit: Payer: Self-pay | Admitting: Critical Care Medicine

## 2022-03-13 NOTE — Telephone Encounter (Signed)
Requested medications are due for refill today.  yes ? ?Requested medications are on the active medications list.  yes ? ?Last refill. 08/02/2021 #90 1 refill ? ?Future visit scheduled.   With pharmacy ? ?Notes to clinic.  Pt is more than 3 months overdue for OV.  ? ? ? ?Requested Prescriptions  ?Pending Prescriptions Disp Refills  ? amLODipine (NORVASC) 10 MG tablet [Pharmacy Med Name: AMLODIPINE BESYLATE 10 MG TAB] 90 tablet 1  ?  Sig: Take 1 tablet (10 mg total) by mouth daily. Please make an appointment to be seen by your PCP.  ?  ? Cardiovascular: Calcium Channel Blockers 2 Failed - 03/11/2022  2:13 PM  ?  ?  Failed - Last BP in normal range  ?  BP Readings from Last 1 Encounters:  ?07/20/19 (!) 166/95  ?  ?  ?  ?  Failed - Valid encounter within last 6 months  ?  Recent Outpatient Visits   ? ?      ? 2 years ago PE (pulmonary thromboembolism) (HCC)  ? [facility] Storm Frisk[facility]  ? 2 years ago Acute deep vein thrombosis (DVT) of femoral vein of right lower extremity (HCC)  ? [facility]isk[facility]  ? 2 years ago PE (pulmonary thromboembolism) [facility])  ? [facility]isk[facility]  ? ?  ?  ? ?  ?  ?  Passed - Last Heart Rate in normal range  ?  Pulse Readings from Last 1 Encounters:  ?07/20/19 94  ?  ?  ?  ?  ?  ?

## 2022-03-13 NOTE — Telephone Encounter (Signed)
Called pt to make follow up appointment  - Call could not be completed. ? ?[employer]. ?

## 2022-04-07 ENCOUNTER — Emergency Department (HOSPITAL_COMMUNITY): Payer: 59

## 2022-04-07 ENCOUNTER — Other Ambulatory Visit: Payer: Self-pay

## 2022-04-07 ENCOUNTER — Inpatient Hospital Stay (HOSPITAL_COMMUNITY)
Admission: EM | Admit: 2022-04-07 | Discharge: 2022-04-11 | DRG: 194 | Disposition: A | Discharge status: Home / Self Care | Payer: 59 | Attending: Internal Medicine | Admitting: Internal Medicine

## 2022-04-07 DIAGNOSIS — I890 Lymphedema, not elsewhere classified: Secondary | ICD-10-CM

## 2022-04-07 DIAGNOSIS — R0602 Shortness of breath: Secondary | ICD-10-CM | POA: Diagnosis not present

## 2022-04-07 DIAGNOSIS — R509 Fever, unspecified: Secondary | ICD-10-CM | POA: Diagnosis present

## 2022-04-07 DIAGNOSIS — Z823 Family history of stroke: Secondary | ICD-10-CM

## 2022-04-07 DIAGNOSIS — K219 Gastro-esophageal reflux disease without esophagitis: Secondary | ICD-10-CM | POA: Diagnosis present

## 2022-04-07 DIAGNOSIS — Z86718 Personal history of other venous thrombosis and embolism: Secondary | ICD-10-CM

## 2022-04-07 DIAGNOSIS — Z6841 Body Mass Index (BMI) 40.0 and over, adult: Secondary | ICD-10-CM

## 2022-04-07 DIAGNOSIS — J189 Pneumonia, unspecified organism: Secondary | ICD-10-CM | POA: Diagnosis not present

## 2022-04-07 DIAGNOSIS — Z86711 Personal history of pulmonary embolism: Secondary | ICD-10-CM | POA: Diagnosis present

## 2022-04-07 DIAGNOSIS — I10 Essential (primary) hypertension: Secondary | ICD-10-CM | POA: Diagnosis present

## 2022-04-07 DIAGNOSIS — Z803 Family history of malignant neoplasm of breast: Secondary | ICD-10-CM

## 2022-04-07 DIAGNOSIS — Z20822 Contact with and (suspected) exposure to covid-19: Secondary | ICD-10-CM | POA: Diagnosis present

## 2022-04-07 DIAGNOSIS — E876 Hypokalemia: Secondary | ICD-10-CM | POA: Diagnosis present

## 2022-04-07 DIAGNOSIS — Z7901 Long term (current) use of anticoagulants: Secondary | ICD-10-CM

## 2022-04-07 DIAGNOSIS — Z8249 Family history of ischemic heart disease and other diseases of the circulatory system: Secondary | ICD-10-CM

## 2022-04-07 DIAGNOSIS — Z833 Family history of diabetes mellitus: Secondary | ICD-10-CM

## 2022-04-07 DIAGNOSIS — R002 Palpitations: Secondary | ICD-10-CM

## 2022-04-07 MED ORDER — ACETAMINOPHEN 500 MG PO TABS
1000.0000 mg | ORAL_TABLET | Freq: Four times a day (QID) | ORAL | Status: DC | PRN
  Administered 2022-04-08 (×2): 1000 mg via ORAL
  Filled 2022-04-07 (×2): qty 2

## 2022-04-07 MED ORDER — SODIUM CHLORIDE 0.9 % IV BOLUS
500.0000 mL | Freq: Once | INTRAVENOUS | Status: AC
  Administered 2022-04-08: 500 mL via INTRAVENOUS

## 2022-04-07 NOTE — ED Provider Notes (Signed)
? ?Emergency Department Provider Note ? ? ?I have reviewed the triage vital signs and the nursing notes. ? ? ?HISTORY ? ?Chief Complaint ?Palpitations and Shortness of Breath ? ? ?HPI ?AARIA Warren is a 46 y.o. female with PMH reviewed including prior DVT/PE on Warfarin presents to the ED with "not feeling right" for the last 48 hours. She describes intermittent tachycardia with no clear provocation along with intermittent SOB. No CP. No syncope. Describes fatigue. Notes a feeling of palpitations from time to time. EMS arrived and report HR in the 140s with ambulation that improved with rest. O2 placed for comfort. Patient notes that all recent INR levels have been within range. Describes compliance with home medications.  ? ? ?Past Medical History:  ?Diagnosis Date  ? Allergy   ? Anxiety   ? Depression   ? DVT (deep venous thrombosis) (HCC)   ? GERD (gastroesophageal reflux disease)   ? Hepatic cyst   ? Hypertension   ? Lymphedema of both lower extremities   ? Migraines   ? Morbid obesity with BMI of 70 and over, adult [facility])   ? PE (pulmonary thromboembolism) (HCC) 03/2019  ? Sleep apnea   ? ? ?Review of Systems ? ?Constitutional: No fever/chills ?Eyes: No visual changes. ?ENT: No sore throat. ?Cardiovascular: Denies chest pain. Positive palpitations.  ?Respiratory: Positive shortness of breath. ?Gastrointestinal: No abdominal pain.  No nausea, no vomiting.  No diarrhea.  No constipation. ?Genitourinary: Negative for dysuria. ?Musculoskeletal: Negative for back pain. ?Skin: Negative for rash. ?Neurological: Negative for headaches, focal weakness or numbness. ? ? ?____________________________________________ ? ? ?PHYSICAL EXAM: ? ?VITAL SIGNS: ?ED Triage Vitals  ?Enc Vitals Group  ?   BP 04/07/22 2324 (!) 154/76  ?   Pulse Rate 04/07/22 2324 (!) 115  ?   Resp 04/07/22 2324 (!) 23  ?   Temp 04/07/22 2324 (!) 100.5 ?F (38.1 ?C)  ?   Temp Source 04/07/22 2324 Oral  ?   SpO2 04/07/22 2324 98 %  ?   Weight 04/07/22  2326 (!) 485 lb (220 kg)  ?   Height 04/07/22 2326 5\' 4"  (1.626 m)  ? ?Constitutional: Alert and oriented. Well appearing and in no acute distress. ?Eyes: Conjunctivae are normal.  ?Head: Atraumatic. ?Nose: No congestion/rhinnorhea. ?Mouth/Throat: Mucous membranes are moist.  Oropharynx non-erythematous. ?Neck: No stridor.   ?Cardiovascular: Tachycardia. Good peripheral circulation. Grossly normal heart sounds.   ?Respiratory: Normal respiratory effort.  No retractions. Lungs CTAB. ?Gastrointestinal: Soft and nontender. No distention.  ?Musculoskeletal: No lower extremity tenderness nor edema. No gross deformities of extremities. ?Neurologic:  Normal speech and language. No gross focal neurologic deficits are appreciated.  ?Skin:  Skin is warm, dry and intact. No rash noted. ? ? ?____________________________________________ ?  ?LABS ?(all labs ordered are listed, but only abnormal results are displayed) ? ?Labs Reviewed  ?COMPREHENSIVE METABOLIC PANEL - Abnormal; Notable for the following components:  ?    Result Value  ? Sodium 133 (*)   ? Potassium 3.2 (*)   ? Glucose, Bld 126 (*)   ? Calcium 8.3 (*)   ? Albumin 2.6 (*)   ? AST 13 (*)   ? Total Bilirubin 2.3 (*)   ? All other components within normal limits  ?CBC WITH DIFFERENTIAL/PLATELET - Abnormal; Notable for the following components:  ? WBC 11.6 (*)   ? Hemoglobin 10.7 (*)   ? HCT 33.9 (*)   ? RDW 15.6 (*)   ? Neutro Abs 9.0 (*)   ?  Monocytes Absolute 1.1 (*)   ? Abs Immature Granulocytes 0.10 (*)   ? All other components within normal limits  ?PROTIME-INR - Abnormal; Notable for the following components:  ? Prothrombin Time 21.1 (*)   ? INR 1.8 (*)   ? All other components within normal limits  ?RESP PANEL BY RT-PCR (FLU A&B, COVID) ARPGX2  ?BRAIN NATRIURETIC PEPTIDE  ?D-DIMER, QUANTITATIVE  ?TROPONIN I (HIGH SENSITIVITY)  ?TROPONIN I (HIGH SENSITIVITY)  ? ?____________________________________________ ? ?EKG ? ? EKG Interpretation ? ?Date/Time:  Sunday April 07 2022 23:23:10 EDT ?Ventricular Rate:  116 ?PR Interval:  175 ?QRS Duration: 98 ?QT Interval:  315 ?QTC Calculation: 438 ?R Axis:   68 ?Text Interpretation: Sinus tachycardia Confirmed by Alona BeneLong, Brendin Situ [phone]([id number]) on 04/07/2022 11:24:46 PM ?  ? ?  ? ? ?____________________________________________ ? ?RADIOLOGY ? ?CT Angio Chest PE W and/or Wo Contrast ? ?Result Date: 04/08/2022 ?CLINICAL DATA:  Palpitations and intermittent shortness of breath. EXAM: CT ANGIOGRAPHY CHEST WITH CONTRAST TECHNIQUE: Multidetector CT imaging of the chest was performed using the standard protocol during bolus administration of intravenous contrast. Multiplanar CT image reconstructions and MIPs were obtained to evaluate the vascular anatomy. RADIATION DOSE REDUCTION: This exam was performed according to the departmental dose-optimization program which includes automated exposure control, adjustment of the mA and/or kV according to patient size and/or use of iterative reconstruction technique. CONTRAST:  80mL OMNIPAQUE IOHEXOL 350 MG/ML SOLN COMPARISON:  None. FINDINGS: Cardiovascular: There is no evidence of aortic aneurysm. The pulmonary arteries are markedly limited in evaluation secondary to suboptimal opacification with intravenous contrast. Normal heart size. No pericardial effusion. Mediastinum/Nodes: No enlarged mediastinal, hilar, or axillary lymph nodes. Thyroid gland, trachea, and esophagus demonstrate no significant findings. Lungs/Pleura: Mild patchy ground-glass appearance of the lung parenchyma is seen. There is no evidence of focal consolidation, pleural effusion or pneumothorax. Upper Abdomen: There is hepatomegaly with diffuse fatty infiltration of the liver parenchyma. Musculoskeletal: Dilated, tortuous vessels are seen throughout the subcutaneous fat of the anterior chest wall on the right. These are seen on the prior study and increased in caliber on the current exam no acute or significant osseous findings. Review of the MIP  images confirms the above findings. IMPRESSION: 1. Mild patchy ground-glass appearance of the lung parenchyma which may represent sequelae associated with mild pulmonary edema. 2. Hepatomegaly with hepatic steatosis. 3. Limited evaluation of the pulmonary arteries, as described above. As result, pulmonary embolism cannot be excluded. Correlation with follow-up chest CTA is recommended if this remains of clinical concern. Electronically Signed   By: Aram Candelahaddeus  Houston M.D.   On: 04/08/2022 03:42  ? ?DG Chest Portable 1 View ? ?Result Date: 04/07/2022 ?CLINICAL DATA:  SOB EXAM: PORTABLE CHEST 1 VIEW COMPARISON:  Chest x-ray 03/31/2019, CT abdomen pelvis 04/12/2019, CT angiography chest 03/29/2019 FINDINGS: Enlarged cardiac silhouette. The heart and mediastinal contours are unchanged. Low lung volumes. No focal consolidation. No pulmonary edema. No pleural effusion. No pneumothorax. No acute osseous abnormality. IMPRESSION: Low lung volumes with no active disease. Electronically Signed   By: Tish FredericksonMorgane  Naveau M.D.   On: 04/07/2022 23:54   ? ?____________________________________________ ? ? ?PROCEDURES ? ?Procedure(s) performed:  ? ?Procedures ? ?None  ?____________________________________________ ? ? ?INITIAL IMPRESSION / ASSESSMENT AND PLAN / ED COURSE ? ?Pertinent labs & imaging results that were available during my care of the patient were reviewed by me and considered in my medical decision making (see chart for details). ?  ?This patient is Presenting for Evaluation of SOB/palpitations, which  does require a range of treatment options, and is a complaint that involves a high risk of morbidity and mortality. ? ?The Differential Diagnoses include CAP, COVID, Flu, PE, Flu, pulmonary edema. ? ?Critical Interventions-  ?  ?Medications  ?acetaminophen (TYLENOL) tablet 1,000 mg (1,000 mg Oral Given 04/08/22 0014)  ?iohexol (OMNIPAQUE) 350 MG/ML injection 80 mL (has no administration in time range)  ?cefTRIAXone (ROCEPHIN) 1 g  in sodium chloride 0.9 % 100 mL IVPB (has no administration in time range)  ?azithromycin (ZITHROMAX) 500 mg in sodium chloride 0.9 % 250 mL IVPB (has no administration in time range)  ?sodium chlorid

## 2022-04-07 NOTE — ED Notes (Signed)
RN unsuccessful at IV attempt. ?

## 2022-04-07 NOTE — ED Notes (Signed)
Pt room air oxygen 100% ?

## 2022-04-07 NOTE — ED Triage Notes (Signed)
Pt bib [facility] from home c/o palpitations and intermittent SOB during ambulation. Around 2am on 04/05/22 she had an acute onset of palpitations and SOB and tonight of it was worse. Denies chest discomfort just feels fluttering. HX of PE, on warfarin.  ? ?EMS vitals ?140 HR walking ?110 HR laying ?100 O2, 4L [place] for comfort ?128/76 ?151 CBG ? ?

## 2022-04-08 ENCOUNTER — Emergency Department (HOSPITAL_COMMUNITY): Payer: 59

## 2022-04-08 DIAGNOSIS — I890 Lymphedema, not elsewhere classified: Secondary | ICD-10-CM | POA: Diagnosis not present

## 2022-04-08 DIAGNOSIS — I10 Essential (primary) hypertension: Secondary | ICD-10-CM | POA: Diagnosis not present

## 2022-04-08 DIAGNOSIS — J189 Pneumonia, unspecified organism: Secondary | ICD-10-CM | POA: Diagnosis present

## 2022-04-08 DIAGNOSIS — Z86711 Personal history of pulmonary embolism: Secondary | ICD-10-CM | POA: Diagnosis not present

## 2022-04-08 DIAGNOSIS — R509 Fever, unspecified: Secondary | ICD-10-CM | POA: Diagnosis not present

## 2022-04-08 LAB — CBC WITH DIFFERENTIAL/PLATELET
Abs Immature Granulocytes: 0.1 10*3/uL — ABNORMAL HIGH (ref 0.00–0.07)
Basophils Absolute: 0 10*3/uL (ref 0.0–0.1)
Basophils Relative: 0 %
Eosinophils Absolute: 0 10*3/uL (ref 0.0–0.5)
Eosinophils Relative: 0 %
HCT: 33.9 % — ABNORMAL LOW (ref 36.0–46.0)
Hemoglobin: 10.7 g/dL — ABNORMAL LOW (ref 12.0–15.0)
Immature Granulocytes: 1 %
Lymphocytes Relative: 12 %
Lymphs Abs: 1.4 10*3/uL (ref 0.7–4.0)
MCH: 27.3 pg (ref 26.0–34.0)
MCHC: 31.6 g/dL (ref 30.0–36.0)
MCV: 86.5 fL (ref 80.0–100.0)
Monocytes Absolute: 1.1 10*3/uL — ABNORMAL HIGH (ref 0.1–1.0)
Monocytes Relative: 10 %
Neutro Abs: 9 10*3/uL — ABNORMAL HIGH (ref 1.7–7.7)
Neutrophils Relative %: 77 %
Platelets: 315 10*3/uL (ref 150–400)
RBC: 3.92 MIL/uL (ref 3.87–5.11)
RDW: 15.6 % — ABNORMAL HIGH (ref 11.5–15.5)
WBC: 11.6 10*3/uL — ABNORMAL HIGH (ref 4.0–10.5)
nRBC: 0 % (ref 0.0–0.2)

## 2022-04-08 LAB — TROPONIN I (HIGH SENSITIVITY)
Troponin I (High Sensitivity): 7 ng/L (ref <18)
Troponin I (High Sensitivity): 7 ng/L (ref <18)

## 2022-04-08 LAB — URINALYSIS, ROUTINE W REFLEX MICROSCOPIC
Bilirubin Urine: NEGATIVE
Glucose, UA: NEGATIVE mg/dL
Hgb urine dipstick: NEGATIVE
Ketones, ur: NEGATIVE mg/dL
Nitrite: POSITIVE — AB
Protein, ur: 30 mg/dL — AB
Specific Gravity, Urine: 1.029 (ref 1.005–1.030)
WBC, UA: >50 WBC/hpf — ABNORMAL HIGH (ref 0–5)
pH: 5 (ref 5.0–8.0)

## 2022-04-08 LAB — COMPREHENSIVE METABOLIC PANEL
ALT: 9 U/L (ref 0–44)
AST: 13 U/L — ABNORMAL LOW (ref 15–41)
Albumin: 2.6 g/dL — ABNORMAL LOW (ref 3.5–5.0)
Alkaline Phosphatase: 68 U/L (ref 38–126)
Anion gap: 9 (ref 5–15)
BUN: 7 mg/dL (ref 6–20)
CO2: 23 mmol/L (ref 22–32)
Calcium: 8.3 mg/dL — ABNORMAL LOW (ref 8.9–10.3)
Chloride: 101 mmol/L (ref 98–111)
Creatinine, Ser: 0.83 mg/dL (ref 0.44–1.00)
GFR, Estimated: >60 mL/min (ref >60)
Glucose, Bld: 126 mg/dL — ABNORMAL HIGH (ref 70–99)
Potassium: 3.2 mmol/L — ABNORMAL LOW (ref 3.5–5.1)
Sodium: 133 mmol/L — ABNORMAL LOW (ref 135–145)
Total Bilirubin: 2.3 mg/dL — ABNORMAL HIGH (ref 0.3–1.2)
Total Protein: 8.1 g/dL (ref 6.5–8.1)

## 2022-04-08 LAB — RESP PANEL BY RT-PCR (FLU A&B, COVID) ARPGX2
Influenza A by PCR: NEGATIVE
Influenza B by PCR: NEGATIVE
SARS Coronavirus 2 by RT PCR: NEGATIVE

## 2022-04-08 LAB — PROTIME-INR
INR: 1.8 — ABNORMAL HIGH (ref 0.8–1.2)
INR: 2 — ABNORMAL HIGH (ref 0.8–1.2)
Prothrombin Time: 21.1 seconds — ABNORMAL HIGH (ref 11.4–15.2)
Prothrombin Time: 22.5 seconds — ABNORMAL HIGH (ref 11.4–15.2)

## 2022-04-08 LAB — HEMOGLOBIN A1C
Hgb A1c MFr Bld: 5.8 % — ABNORMAL HIGH (ref 4.8–5.6)
Mean Plasma Glucose: 119.76 mg/dL

## 2022-04-08 LAB — CBG MONITORING, ED: Glucose-Capillary: 166 mg/dL — ABNORMAL HIGH (ref 70–99)

## 2022-04-08 LAB — BRAIN NATRIURETIC PEPTIDE: B Natriuretic Peptide: 76.1 pg/mL (ref 0.0–100.0)

## 2022-04-08 LAB — D-DIMER, QUANTITATIVE: D-Dimer, Quant: 4.82 ug/mL-FEU — ABNORMAL HIGH (ref 0.00–0.50)

## 2022-04-08 LAB — GLUCOSE, CAPILLARY
Glucose-Capillary: 125 mg/dL — ABNORMAL HIGH (ref 70–99)
Glucose-Capillary: 145 mg/dL — ABNORMAL HIGH (ref 70–99)

## 2022-04-08 LAB — HIV ANTIBODY (ROUTINE TESTING W REFLEX): HIV Screen 4th Generation wRfx: NONREACTIVE

## 2022-04-08 MED ORDER — POTASSIUM CHLORIDE CRYS ER 20 MEQ PO TBCR
40.0000 meq | EXTENDED_RELEASE_TABLET | Freq: Once | ORAL | Status: AC
  Administered 2022-04-08: 40 meq via ORAL
  Filled 2022-04-08: qty 2

## 2022-04-08 MED ORDER — WARFARIN - PHARMACIST DOSING INPATIENT: Freq: Every day | Status: DC

## 2022-04-08 MED ORDER — HYDRALAZINE HCL 20 MG/ML IJ SOLN: 5.0000 mg | INTRAMUSCULAR | Status: DC | PRN

## 2022-04-08 MED ORDER — BISACODYL 5 MG PO TBEC: 5.0000 mg | DELAYED_RELEASE_TABLET | Freq: Every day | ORAL | Status: DC | PRN

## 2022-04-08 MED ORDER — IOHEXOL 350 MG/ML SOLN
100.0000 mL | Freq: Once | INTRAVENOUS | Status: AC | PRN
  Administered 2022-04-08: 80 mL via INTRAVENOUS

## 2022-04-08 MED ORDER — ONDANSETRON HCL 4 MG PO TABS: 4.0000 mg | ORAL_TABLET | Freq: Four times a day (QID) | ORAL | Status: DC | PRN

## 2022-04-08 MED ORDER — ACETAMINOPHEN 325 MG PO TABS
650.0000 mg | ORAL_TABLET | Freq: Four times a day (QID) | ORAL | Status: DC | PRN
  Administered 2022-04-08 – 2022-04-09 (×3): 650 mg via ORAL
  Filled 2022-04-08 (×4): qty 2

## 2022-04-08 MED ORDER — SODIUM CHLORIDE 0.9 % IV SOLN
500.0000 mg | Freq: Once | INTRAVENOUS | Status: AC
  Administered 2022-04-08: 500 mg via INTRAVENOUS
  Filled 2022-04-08: qty 5

## 2022-04-08 MED ORDER — SODIUM CHLORIDE 0.9 % IV SOLN
2.0000 g | INTRAVENOUS | Status: DC
  Administered 2022-04-09 – 2022-04-11 (×3): 2 g via INTRAVENOUS
  Filled 2022-04-08 (×3): qty 20

## 2022-04-08 MED ORDER — POLYETHYLENE GLYCOL 3350 17 G PO PACK: 17.0000 g | PACK | Freq: Every day | ORAL | Status: DC | PRN

## 2022-04-08 MED ORDER — ALBUTEROL SULFATE (2.5 MG/3ML) 0.083% IN NEBU: 2.5000 mg | INHALATION_SOLUTION | RESPIRATORY_TRACT | Status: DC | PRN

## 2022-04-08 MED ORDER — DOCUSATE SODIUM 100 MG PO CAPS
100.0000 mg | ORAL_CAPSULE | Freq: Two times a day (BID) | ORAL | Status: DC
  Administered 2022-04-08 – 2022-04-09 (×3): 100 mg via ORAL
  Filled 2022-04-08 (×3): qty 1

## 2022-04-08 MED ORDER — ACETAMINOPHEN 650 MG RE SUPP: 650.0000 mg | Freq: Four times a day (QID) | RECTAL | Status: DC | PRN

## 2022-04-08 MED ORDER — SODIUM CHLORIDE 0.9 % IV SOLN
1.0000 g | Freq: Once | INTRAVENOUS | Status: AC
  Administered 2022-04-08: 1 g via INTRAVENOUS
  Filled 2022-04-08: qty 10

## 2022-04-08 MED ORDER — IBUPROFEN 600 MG PO TABS: 600.0000 mg | ORAL_TABLET | Freq: Once | ORAL | Status: DC

## 2022-04-08 MED ORDER — AMLODIPINE BESYLATE 10 MG PO TABS
10.0000 mg | ORAL_TABLET | Freq: Every day | ORAL | Status: DC
  Administered 2022-04-08 – 2022-04-11 (×4): 10 mg via ORAL
  Filled 2022-04-08: qty 1
  Filled 2022-04-08: qty 2
  Filled 2022-04-08 (×2): qty 1

## 2022-04-08 MED ORDER — WARFARIN SODIUM 2.5 MG PO TABS
2.5000 mg | ORAL_TABLET | Freq: Once | ORAL | Status: AC
  Administered 2022-04-08: 2.5 mg via ORAL
  Filled 2022-04-08: qty 1

## 2022-04-08 MED ORDER — INSULIN ASPART 100 UNIT/ML IJ SOLN
0.0000 [IU] | Freq: Three times a day (TID) | INTRAMUSCULAR | Status: DC
  Administered 2022-04-08: 3 [IU] via SUBCUTANEOUS
  Administered 2022-04-09 (×2): 2 [IU] via SUBCUTANEOUS
  Administered 2022-04-09 – 2022-04-10 (×2): 3 [IU] via SUBCUTANEOUS

## 2022-04-08 MED ORDER — IBUPROFEN 600 MG PO TABS
600.0000 mg | ORAL_TABLET | Freq: Once | ORAL | Status: AC
  Administered 2022-04-08: 600 mg via ORAL
  Filled 2022-04-08: qty 1

## 2022-04-08 MED ORDER — ONDANSETRON HCL 4 MG/2ML IJ SOLN: 4.0000 mg | Freq: Four times a day (QID) | INTRAMUSCULAR | Status: DC | PRN

## 2022-04-08 MED ORDER — SODIUM CHLORIDE 0.9% FLUSH
3.0000 mL | Freq: Two times a day (BID) | INTRAVENOUS | Status: DC
  Administered 2022-04-08 – 2022-04-11 (×4): 3 mL via INTRAVENOUS

## 2022-04-08 MED ORDER — INSULIN ASPART 100 UNIT/ML IJ SOLN: 0.0000 [IU] | Freq: Every day | INTRAMUSCULAR | Status: DC

## 2022-04-08 MED ORDER — IOHEXOL 350 MG/ML SOLN: 80.0000 mL | Freq: Once | INTRAVENOUS | Status: DC | PRN

## 2022-04-08 NOTE — ED Notes (Signed)
Got patient out the bathroom back to bed on the monitor changed patient sheets patient is resting with bed rails up and call bell in reach  ?

## 2022-04-08 NOTE — Evaluation (Signed)
Physical Therapy Evaluation ?Patient Details ?Name: Susan Warren ?MRN: [id number] ?DOB: 1976/08/03 ?Today's Date: 04/08/2022 ? ?History of Present Illness ? Pt is a 46 y/o female admitted secondary to worsening SOB. Workup pending. PMH includes HTN; super morbid obesity; DVT/PE (2020) on Coumadin; and OSA.  ?Clinical Impression ? Pt admitted secondary to problem above with deficits below. Pt requiring min guard to supervision for mobility tasks this session. SOB noted, but oxygen sats >92% on RA throughout. Anticipate pt will progress well and will not require follow up PT. Will continue to follow acutely.    ?   ? ?Recommendations for follow up therapy are one component of a multi-disciplinary discharge planning process, led by the attending physician.  Recommendations may be updated based on patient status, additional functional criteria and insurance authorization. ? ?Follow Up Recommendations No PT follow up ? ?  ?Assistance Recommended at Discharge Intermittent Supervision/Assistance  ?Patient can return home with the following ? Help with stairs or ramp for entrance;Assistance with cooking/housework ? ?  ?Equipment Recommendations None recommended by PT  ?Recommendations for Other Services ?    ?  ?Functional Status Assessment Patient has had a recent decline in their functional status and demonstrates the ability to make significant improvements in function in a reasonable and predictable amount of time.  ? ?  ?Precautions / Restrictions Precautions ?Precautions: Fall ?Restrictions ?Weight Bearing Restrictions: No  ? ?  ? ?Mobility ? Bed Mobility ?Overal bed mobility: Needs Assistance ?Bed Mobility: Supine to Sit ?  ?  ?Supine to sit: Min guard ?  ?  ?General bed mobility comments: Min guard for safety to sit at EOB. No physical assist required. ?  ? ?Transfers ?Overall transfer level: Needs assistance ?Equipment used: None ?Transfers: Sit to/from Stand ?Sit to Stand: Min guard ?  ?  ?  ?  ?  ?General transfer  comment: Min guard for safety. Increased time to stand secondary to pain ?  ? ?Ambulation/Gait ?Ambulation/Gait assistance: Min guard, Supervision ?Gait Distance (Feet): 20 Feet ?Assistive device: None ?Gait Pattern/deviations: Step-through pattern, Decreased stride length ?Gait velocity: Decreased ?  ?  ?General Gait Details: Slow, waddle type gait. Min guard to supervision for safety. Increase in SOB noted, but oxygen sats >92% on RA. ? ?Stairs ?  ?  ?  ?  ?  ? ?Wheelchair Mobility ?  ? ?Modified Rankin (Stroke Patients Only) ?  ? ?  ? ?Balance Overall balance assessment: Mild deficits observed, not formally tested ?  ?  ?  ?  ?  ?  ?  ?  ?  ?  ?  ?  ?  ?  ?  ?  ?  ?  ?   ? ? ? ?Pertinent Vitals/Pain Pain Assessment ?Pain Assessment: No/denies pain  ? ? ?Home Living Family/patient expects to be discharged to:: Private residence ?Living Arrangements: Children ?Available Help at Discharge: Family;Available 24 hours/day ?Type of Home: House ?Home Access: Stairs to enter ?Entrance Stairs-Rails: Right;Left;Can reach both ?Entrance Stairs-Number of Steps: 2 ?  ?Home Layout: One level ?Home Equipment: Gilmer Mor - single point ?   ?  ?Prior Function Prior Level of Function : Independent/Modified Independent ?  ?  ?  ?  ?  ?  ?  ?  ?  ? ? ?Hand Dominance  ?   ? ?  ?Extremity/Trunk Assessment  ? Upper Extremity Assessment ?Upper Extremity Assessment: Defer to OT evaluation ?  ? ?Lower Extremity Assessment ?Lower Extremity Assessment: Generalized weakness;RLE deficits/detail ?RLE  Deficits / Details: RLE weakness and pain at baseline secondary to previous DVTs ?  ? ?Cervical / Trunk Assessment ?Cervical / Trunk Assessment: Other exceptions ?Cervical / Trunk Exceptions: increased body habitus  ?Communication  ? Communication: No difficulties  ?Cognition Arousal/Alertness: Awake/alert ?Behavior During Therapy: [facility] for tasks assessed/performed ?Overall Cognitive Status: Within Functional Limits for tasks assessed ?  ?  ?  ?  ?  ?  ?   ?  ?  ?  ?  ?  ?  ?  ?  ?  ?  ?  ?  ? ?  ?General Comments   ? ?  ?Exercises    ? ?Assessment/Plan  ?  ?PT Assessment Patient needs continued PT services  ?PT Problem List Decreased strength;Decreased activity tolerance;Decreased balance;Decreased mobility;Decreased knowledge of use of DME;Cardiopulmonary status limiting activity ? ?   ?  ?PT Treatment Interventions DME instruction;Gait training;Stair training;Functional mobility training;Therapeutic activities;Balance training;Therapeutic exercise;Patient/family education   ? ?PT Goals (Current goals can be found in the Care Plan section)  ?Acute Rehab PT Goals ?Patient Stated Goal: to go home ?PT Goal Formulation: With patient ?Time For Goal Achievement: 04/22/22 ?Potential to Achieve Goals: Good ? ?  ?Frequency Min 3X/week ?  ? ? ?Co-evaluation   ?  ?  ?  ?  ? ? ?  ?AM-PAC PT "6 Clicks" Mobility  ?Outcome Measure Help needed turning from your back to your side while in a flat bed without using bedrails?: A Little ?Help needed moving from lying on your back to sitting on the side of a flat bed without using bedrails?: A Little ?Help needed moving to and from a bed to a chair (including a wheelchair)?: A Little ?Help needed standing up from a chair using your arms (e.g., wheelchair or bedside chair)?: A Little ?Help needed to walk in hospital room?: A Little ?Help needed climbing 3-5 steps with a railing? : A Little ?6 Click Score: 18 ? ?  ?End of Session   ?Activity Tolerance: Patient limited by fatigue ?Patient left: in bed;with call bell/phone within reach (sitting EOB) ?Nurse Communication: Mobility status ?PT Visit Diagnosis: Unsteadiness on feet (R26.81);Muscle weakness (generalized) (M62.81) ?  ? ?Time: 1025-8527 ?PT Time Calculation (min) (ACUTE ONLY): 13 min ? ? ?Charges:   PT Evaluation ?$PT Eval Low Complexity: 1 Low ?  ?  ?   ? ? ?Farley Ly, PT, DPT  ?Acute Rehabilitation Services  ?Pager: [phone] ?Office: [phone] ? ? ?[place] [address]  [place] Raboin ?04/08/2022, 2:03 PM ?

## 2022-04-08 NOTE — ED Notes (Signed)
Patient transported to CT 

## 2022-04-08 NOTE — Progress Notes (Signed)
ANTICOAGULATION CONSULT NOTE ? ?Pharmacy Consult for warfarin ?Indication: history pulmonary embolus and DVT ? ? ?Assessment: ?15 yof with hx of DVT/PE (2020) on warfarin PTA presenting with SOB/palpitations. D-dimer 4.82 on presentation - CTA with limited evaluation of pulmonary arteries due to timing of contrast administration, PE cannot be ruled out. INR slightly subtherapeutic at 1.8 on presentation, up to 2.0 today. Hg 10.7, plt wnl. No bleed issues reported. Noted, patient did receive azithromycin 1x dose today - may cause INR to increase. ? ?Per discussion with MD, not overly concerned for acute clot. She discussed with Oncology and likely will not re-scan due to necessity for lifelong anticoagulation anyway. Will continue warfarin only for now unless changes. ? ?PTA warfarin dose: 5mg  daily except 2.5mg  on Mon/Fri (last dose 4/16 PTA) ? ?Goal of Therapy:  ?INR 2-3 ?Monitor platelets by anticoagulation protocol: Yes ?  ?Plan:  ?Warfarin 2.5mg  PO x 1 dose - continue home dose ?Daily INR ?Monitor CBC, s/sx bleeding ? ? ?5/16, PharmD, BCPS ?Please check AMION for all [facility] contact numbers ?Clinical Pharmacist ?04/08/2022 9:53 AM ? ? ?

## 2022-04-08 NOTE — ED Notes (Signed)
Pt is ST on monitor ?

## 2022-04-08 NOTE — Plan of Care (Signed)

## 2022-04-08 NOTE — H&P (Signed)
?History and Physical  ? ? ?Patient: Susan Warren [id number] DOB: March 17, 1976 ?DOA: 04/07/2022 ?DOS: the patient was seen and examined on 04/08/2022 ?PCP: Susan Stain, MD  ?Patient coming from: Home - lives with 48yo son; NOK: Mother, Susan Warren, [phone] ? ? ?Chief Complaint: SOB, palpitations ? ?HPI: Susan Warren is a 46 y.o. female with medical history significant of HTN; super morbid obesity (BMI 83); DVT/PE (2020) on Coumadin; and OSA presenting with SOB, palpitations.  She reports that she has been feeling "bad" for last few days.  She can't really qualify it further.  She developed some SOB yesterday.  No cough.  No fever.  Some palpitations.  She [facility].  Her husband died of sudden cardiac death last October 28, 2023 and she is struggling some.  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 ? ? ? ?ER Course:  Carryover, per Dr. Marlowe Sax: ? ?47 year old with hypertension, morbid obesity, history of prior DVT/PE on Coumadin presented with complaints of not feeling well/palpitations.  Noted to be febrile and tachycardic.  Troponin and BNP normal.  INR slightly subtherapeutic.  CT angiogram showing mild patchy groundglass opacities.  Limited evaluation of pulmonary arteries due to timing of contrast administration, PE cannot be ruled out.  WBC count borderline elevated.  Patient was given antibiotics for pneumonia and IV fluids.  Requested ED physician to add on D-dimer level. ? ? ? ? ?Review of Systems: As mentioned in the history of present illness. All other systems reviewed and are negative. ?Past Medical History:  ?Diagnosis Date  ? Allergy   ? Anxiety   ? Depression   ? DVT (deep venous thrombosis) ([place])   ? GERD (gastroesophageal reflux disease)   ? Hepatic cyst   ? Hypertension   ? Lymphedema of both lower extremities   ? Migraines   ? Morbid obesity with BMI of 70 and over, adult [facility])   ? PE (pulmonary thromboembolism) ([place]) 03/2019  ? Sleep apnea   ? ?Past  Surgical History:  ?Procedure Laterality Date  ? CHOLECYSTECTOMY    ? IR INFUSION THROMBOL ARTERIAL INITIAL (MS)  03/29/2019  ? IR INFUSION THROMBOL ARTERIAL INITIAL (MS)  03/29/2019  ? IR THROMB F/U EVAL ART/VEN FINAL DAY ([place])  03/30/2019  ? IR US GUIDE VASC ACCESS RIGHT  03/29/2019  ? ?Social History:  reports that she has never smoked. She has never used smokeless tobacco. She reports current alcohol use. She reports that she does not use drugs. ? ?Allergies  ?Allergen Reactions  ? Adhesive [Tape] Anaphylaxis and Swelling  ?  *Adhesive Spray*   ? ? ?Family History  ?Problem Relation Age of Onset  ? Hypertension Mother   ? Thyroid disease Mother   ? Hypertension Maternal Grandmother   ? Diabetes Maternal Grandmother   ? Breast cancer Maternal Grandmother   ? Stroke Maternal Grandmother   ? Heart attack Neg Hx   ? ? ?Prior to Admission medications   ?Medication Sig Start Date End Date Taking? Authorizing Provider  ?acetaminophen (TYLENOL) 500 MG tablet Take 1,000 mg by mouth every 6 (six) hours as needed for moderate pain or headache.   Yes [provider]  ?amLODipine (NORVASC) 10 MG tablet TAKE 1 TABLET (10 MG TOTAL) BY MOUTH DAILY. PLEASE MAKE AN APPOINTMENT TO BE SEEN BY YOUR PCP. ?Patient taking differently: Take 10 mg by mouth daily. 03/13/22  Yes Susan Stain, MD  ?warfarin (COUMADIN) 5 MG tablet Take as directed by  the [facility] Clinic. ?Patient taking differently: Take 2.5-5 mg by mouth See admin instructions. 2.5 mg Monday, Friday ?5 mg Tuesday,Wednesday,Thursday,Saturday and sunday 08/02/21  Yes Susan Stain, MD  ?acetaminophen (TYLENOL) 325 MG tablet Take 2 tablets (650 mg total) by mouth every 6 (six) hours as needed for mild pain or headache (or Fever >/= 101). ?Patient not taking: Reported on 04/08/2022 04/14/19   Barton Dubois, MD  ? ? ?Physical Exam: ?Vitals:  ? 04/08/22 0930 04/08/22 1000 04/08/22 1100 04/08/22 1200  ?BP: 130/63 97/61 (!) 143/78 131/67  ?Pulse: (!) 104 98 (!) 101 (!) 102   ?Resp:  (!) 22 20 20   ?Temp:      ?TempSrc:      ?SpO2: 100% 99% 97% 98%  ?Weight:      ?Height:      ? ?Warren:  Appears calm and comfortable and is in NAD, playing on her iPad, exam limited by body habitus ?Eyes:  PERRL, EOMI, normal lids, iris ?ENT:  grossly normal hearing, lips & tongue, mmm; poor dentition ?Neck:  no LAD, masses or thyromegaly ?Cardiovascular:  RR with intermittent mild tachycardia, no m/r/g.  ?Respiratory:   CTA bilaterally with no wheezes/rales/rhonchi.  Normal respiratory effort. ?Abdomen:  soft, NT, ND ?Skin:  no rash or induration seen on limited exam ?Musculoskeletal:  grossly normal tone BUE/BLE, good ROM, no bony abnormality ?Psychiatric:  blunted mood and affect, speech fluent and appropriate, AOx3 ?Neurologic:  CN 2-12 grossly intact, moves all extremities in coordinated fashion ? ? ?Radiological Exams on Admission: ?Independently reviewed - see discussion in A/P where applicable ? ?CT Angio Chest PE W and/or Wo Contrast ? ?Result Date: 04/08/2022 ?CLINICAL DATA:  Palpitations and intermittent shortness of breath. EXAM: CT ANGIOGRAPHY CHEST WITH CONTRAST TECHNIQUE: Multidetector CT imaging of the chest was performed using the standard protocol during bolus administration of intravenous contrast. Multiplanar CT image reconstructions and MIPs were obtained to evaluate the vascular anatomy. RADIATION DOSE REDUCTION: This exam was performed according to the departmental dose-optimization program which includes automated exposure control, adjustment of the mA and/or kV according to patient size and/or use of iterative reconstruction technique. CONTRAST:  57mL OMNIPAQUE IOHEXOL 350 MG/ML SOLN COMPARISON:  None. FINDINGS: Cardiovascular: There is no evidence of aortic aneurysm. The pulmonary arteries are markedly limited in evaluation secondary to suboptimal opacification with intravenous contrast. Normal heart size. No pericardial effusion. Mediastinum/Nodes: No enlarged mediastinal,  hilar, or axillary lymph nodes. Thyroid gland, trachea, and esophagus demonstrate no significant findings. Lungs/Pleura: Mild patchy ground-glass appearance of the lung parenchyma is seen. There is no evidence of focal consolidation, pleural effusion or pneumothorax. Upper Abdomen: There is hepatomegaly with diffuse fatty infiltration of the liver parenchyma. Musculoskeletal: Dilated, tortuous vessels are seen throughout the subcutaneous fat of the anterior chest wall on the right. These are seen on the prior study and increased in caliber on the current exam no acute or significant osseous findings. Review of the MIP images confirms the above findings. IMPRESSION: 1. Mild patchy ground-glass appearance of the lung parenchyma which may represent sequelae associated with mild pulmonary edema. 2. Hepatomegaly with hepatic steatosis. 3. Limited evaluation of the pulmonary arteries, as described above. As result, pulmonary embolism cannot be excluded. Correlation with follow-up chest CTA is recommended if this remains of clinical concern. Electronically Signed   By: Virgina Norfolk M.D.   On: 04/08/2022 03:42  ? ?DG Chest Portable 1 View ? ?Result Date: 04/07/2022 ?CLINICAL DATA:  SOB EXAM: PORTABLE CHEST 1 VIEW COMPARISON:  Chest x-ray 03/31/2019, CT abdomen pelvis 04/12/2019, CT angiography chest 03/29/2019 FINDINGS: Enlarged cardiac silhouette. The heart and mediastinal contours are unchanged. Low lung volumes. No focal consolidation. No pulmonary edema. No pleural effusion. No pneumothorax. No acute osseous abnormality. IMPRESSION: Low lung volumes with no active disease. Electronically Signed   By: Iven Finn M.D.   On: 04/07/2022 23:54   ? ?EKG: Independently reviewed.  Sinus tachycardia with rate 116; no evidence of acute ischemia ? ? ?Labs on Admission: I have personally reviewed the available labs and imaging studies at the time of the admission. ? ?Pertinent labs:   ? ?K+ 3.2 ?Glucose 126 ?Albumin  2.6 ?Bili 2.3 ?BNP 76.1 ?HS troponin 7, 7 ?WBC 11.6 ?Hgb 10.7 ?D-dimer 4.82 ?INR 1.8 -> 2.0 ?COVID/flu negative ? ? ?Assessment and Plan: ?Principal Problem: ?  Fever ?Active Problems: ?  Hypertension ?  History of pulm

## 2022-04-09 ENCOUNTER — Observation Stay (HOSPITAL_COMMUNITY): Payer: 59

## 2022-04-09 DIAGNOSIS — R0602 Shortness of breath: Secondary | ICD-10-CM

## 2022-04-09 DIAGNOSIS — Z803 Family history of malignant neoplasm of breast: Secondary | ICD-10-CM | POA: Diagnosis not present

## 2022-04-09 DIAGNOSIS — R509 Fever, unspecified: Secondary | ICD-10-CM | POA: Diagnosis not present

## 2022-04-09 DIAGNOSIS — Z833 Family history of diabetes mellitus: Secondary | ICD-10-CM | POA: Diagnosis not present

## 2022-04-09 DIAGNOSIS — Z86718 Personal history of other venous thrombosis and embolism: Secondary | ICD-10-CM | POA: Diagnosis not present

## 2022-04-09 DIAGNOSIS — Z86711 Personal history of pulmonary embolism: Secondary | ICD-10-CM | POA: Diagnosis not present

## 2022-04-09 DIAGNOSIS — Z7901 Long term (current) use of anticoagulants: Secondary | ICD-10-CM | POA: Diagnosis not present

## 2022-04-09 DIAGNOSIS — I10 Essential (primary) hypertension: Secondary | ICD-10-CM | POA: Diagnosis present

## 2022-04-09 DIAGNOSIS — E876 Hypokalemia: Secondary | ICD-10-CM | POA: Diagnosis present

## 2022-04-09 DIAGNOSIS — Z6841 Body Mass Index (BMI) 40.0 and over, adult: Secondary | ICD-10-CM | POA: Diagnosis not present

## 2022-04-09 DIAGNOSIS — Z8249 Family history of ischemic heart disease and other diseases of the circulatory system: Secondary | ICD-10-CM | POA: Diagnosis not present

## 2022-04-09 DIAGNOSIS — I890 Lymphedema, not elsewhere classified: Secondary | ICD-10-CM | POA: Diagnosis present

## 2022-04-09 DIAGNOSIS — Z823 Family history of stroke: Secondary | ICD-10-CM | POA: Diagnosis not present

## 2022-04-09 DIAGNOSIS — J189 Pneumonia, unspecified organism: Secondary | ICD-10-CM | POA: Diagnosis present

## 2022-04-09 DIAGNOSIS — K219 Gastro-esophageal reflux disease without esophagitis: Secondary | ICD-10-CM | POA: Diagnosis present

## 2022-04-09 DIAGNOSIS — Z20822 Contact with and (suspected) exposure to covid-19: Secondary | ICD-10-CM | POA: Diagnosis present

## 2022-04-09 HISTORY — DX: Pneumonia, unspecified organism: J18.9

## 2022-04-09 LAB — COMPREHENSIVE METABOLIC PANEL
ALT: 10 U/L (ref 0–44)
AST: 12 U/L — ABNORMAL LOW (ref 15–41)
Albumin: 2.2 g/dL — ABNORMAL LOW (ref 3.5–5.0)
Alkaline Phosphatase: 67 U/L (ref 38–126)
Anion gap: 12 (ref 5–15)
BUN: 7 mg/dL (ref 6–20)
CO2: 22 mmol/L (ref 22–32)
Calcium: 8 mg/dL — ABNORMAL LOW (ref 8.9–10.3)
Chloride: 100 mmol/L (ref 98–111)
Creatinine, Ser: 0.78 mg/dL (ref 0.44–1.00)
GFR, Estimated: >60 mL/min (ref >60)
Glucose, Bld: 120 mg/dL — ABNORMAL HIGH (ref 70–99)
Potassium: 3.3 mmol/L — ABNORMAL LOW (ref 3.5–5.1)
Sodium: 134 mmol/L — ABNORMAL LOW (ref 135–145)
Total Bilirubin: 1.6 mg/dL — ABNORMAL HIGH (ref 0.3–1.2)
Total Protein: 7.2 g/dL (ref 6.5–8.1)

## 2022-04-09 LAB — CBC
HCT: 31.4 % — ABNORMAL LOW (ref 36.0–46.0)
Hemoglobin: 9.7 g/dL — ABNORMAL LOW (ref 12.0–15.0)
MCH: 26.8 pg (ref 26.0–34.0)
MCHC: 30.9 g/dL (ref 30.0–36.0)
MCV: 86.7 fL (ref 80.0–100.0)
Platelets: 281 10*3/uL (ref 150–400)
RBC: 3.62 MIL/uL — ABNORMAL LOW (ref 3.87–5.11)
RDW: 15.6 % — ABNORMAL HIGH (ref 11.5–15.5)
WBC: 8.7 10*3/uL (ref 4.0–10.5)
nRBC: 0 % (ref 0.0–0.2)

## 2022-04-09 LAB — GLUCOSE, CAPILLARY
Glucose-Capillary: 133 mg/dL — ABNORMAL HIGH (ref 70–99)
Glucose-Capillary: 156 mg/dL — ABNORMAL HIGH (ref 70–99)
Glucose-Capillary: 148 mg/dL — ABNORMAL HIGH (ref 70–99)
Glucose-Capillary: 110 mg/dL — ABNORMAL HIGH (ref 70–99)

## 2022-04-09 LAB — PROTIME-INR
INR: 2.2 — ABNORMAL HIGH (ref 0.8–1.2)
Prothrombin Time: 24.2 seconds — ABNORMAL HIGH (ref 11.4–15.2)

## 2022-04-09 LAB — BRAIN NATRIURETIC PEPTIDE: B Natriuretic Peptide: 68.8 pg/mL (ref 0.0–100.0)

## 2022-04-09 LAB — MAGNESIUM: Magnesium: 1.7 mg/dL (ref 1.7–2.4)

## 2022-04-09 MED ORDER — KETOROLAC TROMETHAMINE 30 MG/ML IJ SOLN
30.0000 mg | Freq: Once | INTRAMUSCULAR | Status: AC
  Administered 2022-04-09: 30 mg via INTRAVENOUS
  Filled 2022-04-09: qty 1

## 2022-04-09 MED ORDER — MORPHINE SULFATE (PF) 2 MG/ML IV SOLN
2.0000 mg | Freq: Once | INTRAVENOUS | Status: AC
  Administered 2022-04-09: 2 mg via INTRAVENOUS
  Filled 2022-04-09: qty 1

## 2022-04-09 MED ORDER — POTASSIUM CHLORIDE CRYS ER 20 MEQ PO TBCR
40.0000 meq | EXTENDED_RELEASE_TABLET | Freq: Once | ORAL | Status: AC
  Administered 2022-04-09: 40 meq via ORAL
  Filled 2022-04-09: qty 2

## 2022-04-09 MED ORDER — POTASSIUM CHLORIDE 2 MEQ/ML IV SOLN
INTRAVENOUS | Status: AC
  Filled 2022-04-09 (×2): qty 1000

## 2022-04-09 MED ORDER — IBUPROFEN 600 MG PO TABS
600.0000 mg | ORAL_TABLET | Freq: Once | ORAL | Status: AC
  Administered 2022-04-09: 600 mg via ORAL
  Filled 2022-04-09 (×2): qty 1

## 2022-04-09 MED ORDER — WARFARIN SODIUM 5 MG PO TABS
5.0000 mg | ORAL_TABLET | Freq: Once | ORAL | Status: AC
  Administered 2022-04-09: 5 mg via ORAL
  Filled 2022-04-09: qty 1

## 2022-04-09 NOTE — Progress Notes (Signed)
Nutrition Brief Note ? ?RD received a consult for nutritional goals. ? ?Wt Readings from Last 15 Encounters:  ?04/07/22 (!) 220 kg  ?07/20/19 (!) 222.6 kg  ?05/18/19 (!) 229.1 kg  ?04/15/19 (!) 222 kg  ?04/12/19 (!) 222 kg  ?04/05/19 (!) 221.1 kg  ?02/21/19 (!) 204.1 kg  ?02/05/17 (!) 215.5 kg  ?03/28/16 (!) 204 kg  ?04/07/15 (!) 184.7 kg  ?01/25/14 (!) 176.1 kg  ?03/10/13 (!) 167.5 kg  ? ? ?Body mass index is 83.25 kg/m?Marland Kitchen Patient meets criteria for morbid obesity based on current BMI.  ? ?Pt reports that her appetite is ok, reports that food just doesn't look appetizing due to current illness. Reports that current respiratory issues have not impacted her intake.  ? ?Current diet order is regular, no meal completions have been documented within EMR. Labs and medications reviewed.  ? ?Pt with no questions or concerns at this time.  ? ?No nutrition interventions warranted at this time. If nutrition issues arise, please consult RD.  ? ? ?Kirby Crigler RD, LDN ?Clinical Dietitian ?See AMiON for contact information.  ? ? ?

## 2022-04-09 NOTE — Progress Notes (Incomplete)
Lower extremity venous bilateral study completed. ? ?On behalf of Jean Rosenthal, RDMS, RVT ?

## 2022-04-09 NOTE — Progress Notes (Signed)
?                                  PROGRESS NOTE                                             ?                                                                                                                     ?                                         ? ? Patient Demographics:  ? ? Susan Warren, is a 46 y.o. female, DOB - January 30, 1976, VOZ:[id number] ? ?Outpatient Primary MD for the patient is Storm Frisk, MD    LOS - 0  Admit date - 04/07/2022   ? ?Chief Complaint  ?Patient presents with  ? Palpitations  ? Shortness of Breath  ?    ? ?Brief Narrative (HPI from H&P)   46 y.o. female with medical history significant of HTN; super morbid obesity (BMI 83); DVT/PE (2020) on Coumadin; and OSA presenting with SOB, palpitations.  She reports that she has been feeling "bad" for last few days.  She can't really qualify it further.  She developed some SOB yesterday.  No cough.  No fever.  She was diagnosed with possible pneumonia and admitted to the hospital. ? ? Subjective:  ? ? Susan Warren today has, No headache, No chest pain, No abdominal pain - No Nausea, No new weakness tingling or numbness, mild cough no SOB. ? ? Assessment  & Plan :  ? ?Fever, cough, feeling poorly.  CT evidence of possible pneumonia, continue the patient on empiric IV antibiotics and monitor.  Encouraged to sit up in chair in daytime use I-S and flutter valve.  Advance activity and monitor oxygen needs.  Likely discharge in 1 to 2 days. ? ? ?2.  History of PE.  On Coumadin, INR was slightly subtherapeutic, CTA although limited was negative, D-dimer is elevated and will check lower extremity venous duplex just for diagnostic purposes, continue Coumadin keep INR therapeutic. ? ?3.  Hypertension.  On Norvasc. ? ?4.  Morbid obesity with BMI of 83.  Follow with PCP for weight loss. ? ?5.  Chronic lymphedema.  Supportive care. ? ?   ? ?Condition - Fair ? ?Family Communication  :  None present ? ?Code Status :   Full ? ?Consults  :  None ? ?PUD Prophylaxis :  ? ? Procedures  :    ? ?CTA - 1. Mild patchy ground-glass appearance of the lung parenchyma which may represent sequelae associated with mild pulmonary edema. 2. Hepatomegaly with  hepatic steatosis. 3. Limited evaluation of the pulmonary arteries, as described above. As result, pulmonary embolism cannot be excluded. Correlation with follow-up chest CTA is recommended if this remains of clinical concern. ? ?Leg US -  ? ?   ? ?Disposition Plan  :   ? ?Status is: Observation ? ?DVT Prophylaxis  :   ? ? ?warfarin (COUMADIN) tablet 5 mg  ? ?Lab Results  ?Component Value Date  ? PLT 281 04/09/2022  ? ?Lab Results  ?Component Value Date  ? INR 2.2 (H) 04/09/2022  ? INR 2.0 (H) 04/08/2022  ? INR 1.8 (H) 04/07/2022  ? ? ?Diet :  ?Diet Order   ? ?       ?  Diet regular Room service appropriate? Yes; Fluid consistency: Thin  Diet effective now       ?  ? ?  ?  ? ?  ?  ? ?Inpatient Medications ? ?Scheduled Meds: ? amLODipine  10 mg Oral Daily  ? docusate sodium  100 mg Oral BID  ? insulin aspart  0-15 Units Subcutaneous TID WC  ? insulin aspart  0-5 Units Subcutaneous QHS  ? potassium chloride  40 mEq Oral Once  ? sodium chloride flush  3 mL Intravenous Q12H  ? warfarin  5 mg Oral ONCE-1600  ? Warfarin - Pharmacist Dosing Inpatient   Does not apply q1600  ? ?Continuous Infusions: ? cefTRIAXone (ROCEPHIN)  IV 200 mL/hr at 04/09/22 0409  ? lactated ringers with kcl 75 mL/hr at 04/09/22 0656  ? ?PRN Meds:.acetaminophen **OR** acetaminophen, albuterol, bisacodyl, hydrALAZINE, ondansetron **OR** ondansetron (ZOFRAN) IV, polyethylene glycol ? ?Antibiotics  :   ? ?Anti-infectives (From admission, onward)  ? ? Start     Dose/Rate Route Frequency Ordered Stop  ? 04/09/22 0400  cefTRIAXone (ROCEPHIN) 2 g in sodium chloride 0.9 % 100 mL IVPB       ? 2 g ?200 mL/hr over 30 Minutes Intravenous Every 24 hours 04/08/22 1945    ? 04/08/22 0415  cefTRIAXone (ROCEPHIN) 1 g in sodium chloride 0.9 %  100 mL IVPB       ? 1 g ?200 mL/hr over 30 Minutes Intravenous  Once 04/08/22 0401 04/08/22 0642  ? 04/08/22 0415  azithromycin (ZITHROMAX) 500 mg in sodium chloride 0.9 % 250 mL IVPB       ? 500 mg ?250 mL/hr over 60 Minutes Intravenous  Once 04/08/22 0401 04/08/22 0849  ? ?  ? ? ? Time Spent in minutes  30 ? ? ?Susa RaringPrashant Hayli Milligan M.D on 04/09/2022 at 10:01 AM ? ?To page go to [email or web address]  ? ?[facility] -  Office  [phone] ? ?See all Orders from today for further details ? ? ? Objective:  ? ?Vitals:  ? 04/08/22 2322 04/09/22 0323 04/09/22 0630 04/09/22 0747  ?BP: 119/62 (!) 130/58 132/71 (!) 124/54  ?Pulse: 95 100 100 73  ?Resp: 20 20 17 17   ?Temp: 98.9 ?F (37.2 ?C) 98.3 ?F (36.8 ?C) 98.8 ?F (37.1 ?C) 99.3 ?F (37.4 ?C)  ?TempSrc: Oral Oral Oral Oral  ?SpO2: 96% 98% 97% 92%  ?Weight:      ?Height:      ? ? ?Wt Readings from Last 3 Encounters:  ?04/07/22 (!) 220 kg  ?07/20/19 (!) 222.6 kg  ?05/18/19 (!) 229.1 kg  ? ? ? ?Intake/Output Summary (Last 24 hours) at 04/09/2022 1001 ?Last data filed at 04/09/2022 0409 ?Gross per 24 hour  ?Intake 14.61 ml  ?Output 300 ml  ?Net -  285.39 ml  ? ? ? ?Physical Exam ? ?Awake Alert, No new F.N deficits, Normal affect ?[place].AT,PERRAL ?Supple Neck, No JVD,   ?Symmetrical Chest wall movement, Good air movement bilaterally, CTAB ?RRR,No Gallops,Rubs or new Murmurs,  ?+ve B.Sounds, Abd Soft, No tenderness,   ?No Cyanosis, 3+ chronic lymphedema in legs ?  ?  ? ? Data Review:  ? ? ?CBC ?Recent Labs  ?Lab 04/07/22 ?2341 04/09/22 ?0133  ?WBC 11.6* 8.7  ?HGB 10.7* 9.7*  ?HCT 33.9* 31.4*  ?PLT 315 281  ?MCV 86.5 86.7  ?MCH 27.3 26.8  ?MCHC 31.6 30.9  ?RDW 15.6* 15.6*  ?LYMPHSABS 1.4  --   ?MONOABS 1.1*  --   ?EOSABS 0.0  --   ?BASOSABS 0.0  --   ? ? ?Electrolytes ?Recent Labs  ?Lab 04/07/22 ?2341 04/08/22 ?0422 04/08/22 ?1130 04/09/22 ?0133  ?NA 133*  --   --  134*  ?K 3.2*  --   --  3.3*  ?CL 101  --   --  100  ?CO2 23  --   --  22  ?GLUCOSE 126*  --   --  120*  ?BUN 7  --   --  7   ?CREATININE 0.83  --   --  0.78  ?CALCIUM 8.3*  --   --  8.0*  ?AST 13*  --   --  12*  ?ALT 9  --   --  10  ?ALKPHOS 68  --   --  67  ?BILITOT 2.3*  --   --  1.6*  ?ALBUMIN 2.6*  --   --  2.2*  ?MG  --   --   --  1.7  ?DDIMER 4.82*  --   --   --   ?INR 1.8*  --  2.0* 2.2*  ?HGBA1C  --  5.8*  --   --   ?BNP 76.1  --   --  68.8  ? ? ?------------------------------------------------------------------------------------------------------------------ ?No results for input(s): CHOL, HDL, LDLCALC, TRIG, CHOLHDL, LDLDIRECT in the last 72 hours. ? ?Lab Results  ?Component Value Date  ? HGBA1C 5.8 (H) 04/08/2022  ? ? ?No results for input(s): TSH, T4TOTAL, T3FREE, THYROIDAB in the last 72 hours. ? ?Invalid input(s): FREET3 ?------------------------------------------------------------------------------------------------------------------ ?ID Labs ?Recent Labs  ?Lab 04/07/22 ?2341 04/09/22 ?0133  ?WBC 11.6* 8.7  ?PLT 315 281  ?DDIMER 4.82*  --   ?CREATININE 0.83 0.78  ? ? ?Radiology Reports ?CT Angio Chest PE W and/or Wo Contrast ? ?Result Date: 04/08/2022 ?CLINICAL DATA:  Palpitations and intermittent shortness of breath. EXAM: CT ANGIOGRAPHY CHEST WITH CONTRAST TECHNIQUE: Multidetector CT imaging of the chest was performed using the standard protocol during bolus administration of intravenous contrast. Multiplanar CT image reconstructions and MIPs were obtained to evaluate the vascular anatomy. RADIATION DOSE REDUCTION: This exam was performed according to the departmental dose-optimization program which includes automated exposure control, adjustment of the mA and/or kV according to patient size and/or use of iterative reconstruction technique. CONTRAST:  28mL OMNIPAQUE IOHEXOL 350 MG/ML SOLN COMPARISON:  None. FINDINGS: Cardiovascular: There is no evidence of aortic aneurysm. The pulmonary arteries are markedly limited in evaluation secondary to suboptimal opacification with intravenous contrast. Normal heart size. No  pericardial effusion. Mediastinum/Nodes: No enlarged mediastinal, hilar, or axillary lymph nodes. Thyroid gland, trachea, and esophagus demonstrate no significant findings. Lungs/Pleura: Mild patchy ground-glass appearance

## 2022-04-09 NOTE — Progress Notes (Signed)
Verbal order from Dr. Thedore Mins for ibuprofen 600 mg once for pts. Headache  ?

## 2022-04-09 NOTE — Evaluation (Signed)
Occupational Therapy Evaluation and Discharge Summary ?Patient Details ?Name: Susan Warren ?MRN: [id number] ?DOB: 10-Nov-1976 ?Today's Date: 04/09/2022 ? ? ?History of Present Illness Pt is a 46 y/o female admitted secondary to worsening SOB. Pt with pneumonia and UTI. PMH includes HTN; super morbid obesity; DVT/PE (2020) on Coumadin; and OSA.  ? ?Clinical Impression ?  ?Pt admitted with the above diagnosis and overall appears to be at or very close to baseline with all adls. Pt lives with her son who is around all the time and mother in law is down the street. Pt able to complete basic adls and has good set up at home.  No further acute OT needs identified.  ?  ?   ? ?Recommendations for follow up therapy are one component of a multi-disciplinary discharge planning process, led by the attending physician.  Recommendations may be updated based on patient status, additional functional criteria and insurance authorization.  ? ?Follow Up Recommendations ? No OT follow up  ?  ?Assistance Recommended at Discharge Intermittent Supervision/Assistance  ?Patient can return home with the following Assistance with cooking/housework ? ?  ?Functional Status Assessment ? Patient has not had a recent decline in their functional status  ?Equipment Recommendations ? None recommended by OT  ?  ?Recommendations for Other Services   ? ? ?  ?Precautions / Restrictions Precautions ?Precautions: Fall ?Restrictions ?Weight Bearing Restrictions: No  ? ?  ? ?Mobility Bed Mobility ?Overal bed mobility: Needs Assistance ?Bed Mobility: Supine to Sit, Sit to Supine ?  ?  ?Supine to sit: Supervision ?Sit to supine: Min assist ?  ?General bed mobility comments: assist to get legs back into bed. Pt states hospital bed too small to be able to do this but has no problem at home. ?  ? ?Transfers ?Overall transfer level: Needs assistance ?Equipment used: None ?Transfers: Sit to/from Stand ?Sit to Stand: Supervision ?  ?  ?  ?  ?  ?General transfer  comment: assist only for lines ?  ? ?  ?Balance Overall balance assessment: Mild deficits observed, not formally tested ?  ?  ?  ?  ?  ?  ?  ?  ?  ?  ?  ?  ?  ?  ?  ?  ?  ?  ?   ? ?ADL either performed or assessed with clinical judgement  ? ?ADL Overall ADL's : At baseline ?  ?  ?  ?  ?  ?  ?  ?  ?  ?  ?  ?  ?  ?  ?  ?  ?  ?  ?  ?General ADL Comments: Pt at baseline with all basic adls.  ? ? ? ?Vision Baseline Vision/History: 0 No visual deficits ?Ability to See in Adequate Light: 0 Adequate ?Patient Visual Report: No change from baseline ?Vision Assessment?: No apparent visual deficits  ?   ?Perception   ?  ?Praxis   ?  ? ?Pertinent Vitals/Pain Pain Assessment ?Pain Assessment: No/denies pain  ? ? ? ?Hand Dominance Right ?  ?Extremity/Trunk Assessment Upper Extremity Assessment ?Upper Extremity Assessment: Overall WFL for tasks assessed ?  ?Lower Extremity Assessment ?Lower Extremity Assessment: Defer to PT evaluation ?  ?Cervical / Trunk Assessment ?Cervical / Trunk Assessment: Other exceptions ?Cervical / Trunk Exceptions: increased body habitus ?  ?Communication Communication ?Communication: No difficulties ?  ?Cognition Arousal/Alertness: Awake/alert ?Behavior During Therapy: [facility] for tasks assessed/performed ?Overall Cognitive Status: Within Functional Limits for tasks assessed ?  ?  ?  ?  ?  ?  ?  ?  ?  ?  ?  ?  ?  ?  ?  ?  ?  ?  ?  ?  General Comments  Pt most limited by body habitus. ? ?  ?Exercises   ?  ?Shoulder Instructions    ? ? ?Home Living Family/patient expects to be discharged to:: Private residence ?Living Arrangements: Children ?Available Help at Discharge: Family;Available 24 hours/day ?Type of Home: House ?Home Access: Stairs to enter ?Entrance Stairs-Number of Steps: 2 ?Entrance Stairs-Rails: Right;Left;Can reach both ?Home Layout: One level ?  ?  ?Bathroom Shower/Tub: Tub/shower unit;Curtain ?  ?Bathroom Toilet: Handicapped height ?  ?  ?Home Equipment: Gilmer Mor - single point ?  ?  ?  ? ?  ?Prior  Functioning/Environment Prior Level of Function : Independent/Modified Independent ?  ?  ?  ?  ?  ?  ?  ?ADLs Comments: Pt completes all adls independently and drives some but not often.  Pt works from home and son does all the cooking. ?  ? ?  ?  ?OT Problem List:   ?  ?   ?OT Treatment/Interventions:    ?  ?OT Goals(Current goals can be found in the care plan section) Acute Rehab OT Goals ?Patient Stated Goal: to go home with my dog ?OT Goal Formulation: All assessment and education complete, DC therapy  ?OT Frequency:   ?  ? ?Co-evaluation   ?  ?  ?  ?  ? ?  ?AM-PAC OT "6 Clicks" Daily Activity     ?Outcome Measure Help from another person eating meals?: None ?Help from another person taking care of personal grooming?: None ?Help from another person toileting, which includes using toliet, bedpan, or urinal?: None ?Help from another person bathing (including washing, rinsing, drying)?: None ?Help from another person to put on and taking off regular upper body clothing?: None ?Help from another person to put on and taking off regular lower body clothing?: None ?6 Click Score: 24 ?  ?End of Session Nurse Communication: Mobility status ? ?Activity Tolerance: Patient tolerated treatment well ?Patient left: in bed;with call bell/phone within reach ? ?OT Visit Diagnosis: Unsteadiness on feet (R26.81)  ?              ?Time: 0935-1000 ?OT Time Calculation (min): 25 min ?Charges:  OT General Charges ?$OT Visit: 1 Visit ?OT Evaluation ?$OT Eval Moderate Complexity: 1 Mod ?OT Treatments ?$Self Care/Home Management : 8-22 mins ? ?Hope Budds ?04/09/2022, 10:09 AM ?

## 2022-04-09 NOTE — TOC Initial Note (Signed)
Transition of Care (TOC) - Initial/Assessment Note  ? ? ?Patient Details  ?Name: Susan Warren ?MRN: [id number] ?Date of Birth: [birth year] ? ?Transition of Care [facility]) CM/SW Contact:    ?Durenda Guthrie, RN ?Phone Number: ?04/09/2022, 9:11 AM ? ?Clinical Narrative:                 ?Transition of Care Screening note: ? ?Transition of Care [facility]) Department has reviewed patient and no TOC needs have been identified at this time. We will continue to monitor patient advancement through Interdisciplinary progressions and if new patient needs arise, please place a consult.   ?  ?  ? ? ?Patient Goals and CMS Choice ?  ?  ?  ? ?Expected Discharge Plan and Services ?  ?  ?  ?  ?  ?                ?  ?  ?  ?  ?  ?  ?  ?  ?  ?  ? ?Prior Living Arrangements/Services ?  ?  ?  ?       ?  ?  ?  ?  ? ?Activities of Daily Living ?  ?  ? ?Permission Sought/Granted ?  ?  ?   ?   ?   ?   ? ?Emotional Assessment ?  ?  ?  ?  ?  ?  ? ?Admission diagnosis:  Palpitations [R00.2] ?Pneumonia [J18.9] ?SOB (shortness of breath) [R06.02] ?Patient Active Problem List  ? Diagnosis Date Noted  ? Fever 04/08/2022  ? Obesity, morbid, BMI 50 or higher (HCC)   ? Right leg DVT (HCC) 04/12/2019  ? Hepatic cyst 04/12/2019  ? Lymphedema of both lower extremities   ? History of pulmonary embolism 03/29/2019  ? Preventative health care 01/25/2014  ? Hypertension   ? Depression   ? GERD (gastroesophageal reflux disease)   ? Allergy   ? Migraines   ? Anxiety   ? IUD (intrauterine device) in place 03/10/2013  ? ?PCP:  Storm Frisk, MD ?Pharmacy:   ?[id number] [address] [address], [place] - [phone] AT [facility] [address] ?[address] ?[place] [address] ?Phone: [phone] Fax: [phone] ? ?[facility] ?[address] ?[place] [address] ?Phone: [phone] Fax: [phone] ? ? ? ? ?Social Determinants of Health (SDOH) Interventions ?  ? ?Readmission Risk Interventions ?   ? View : No  data to display.  ?  ?  ?  ? ? ? ?

## 2022-04-09 NOTE — Progress Notes (Signed)
ANTICOAGULATION CONSULT NOTE ? ?Pharmacy Consult for Warfarin ?Indication: pulmonary embolus and DVT ? ?Allergies  ?Allergen Reactions  ? Adhesive [Tape] Anaphylaxis and Swelling  ?  *Adhesive Spray*   ? ? ?Patient Measurements: ?Height: 5\' 4"  (162.6 cm) ?Weight: (!) 220 kg (485 lb) ?IBW/kg (Calculated) : 54.7 ? ?Vital Signs: ?Temp: 99.3 ?F (37.4 ?C) (04/18 0747) ?Temp Source: Oral (04/18 0747) ?BP: 124/54 (04/18 0747) ?Pulse Rate: 73 (04/18 0747) ? ?Labs: ?Recent Labs  ?  04/07/22 ?2341 04/08/22 ?0422 04/08/22 ?1130 04/09/22 ?0133  ?HGB 10.7*  --   --  9.7*  ?HCT 33.9*  --   --  31.4*  ?PLT 315  --   --  281  ?LABPROT 21.1*  --  22.5* 24.2*  ?INR 1.8*  --  2.0* 2.2*  ?CREATININE 0.83  --   --  0.78  ?TROPONINIHS 7 7  --   --   ? ? ?Estimated Creatinine Clearance: 164 mL/min (by C-G formula based on SCr of 0.78 mg/dL). ? ? ?PTA [facility]farin [facility]:  ?- 5mg  daily except 2.5mg  on Mon/Fri (last dose 4/16 PTA) ? ?Assessment: ?42 yof with hx of DVT/PE (2020) on warfarin PTA presenting with SOB/palpitations. D-dimer 4.82 on presentation. Due to timing of contrast administration, CTA with limited evaluation of pulmonary arteries and could not rule out PE.  ? ?Per discussion with MD, not overly concerned for acute clot. She discussed with Oncology and likely will not re-scan due to necessity for lifelong anticoagulation anyway.  ? ?INR now therapeutic at 2.2. Hgb 9.7, platelets 281 both down slightly, no s/sx bleeding reported. Patient did receive azithromycin 1x dose 4/17, will monitor for  increase in INR.  ?  ? Goal of Therapy:  ?INR 2-3 ?Monitor platelets by anticoagulation protocol: Yes ?  ?Plan:  ?Give warfarin 5 mg PO x1 dose (PTA regimen) ?Check INR daily while on warfarin ?Continue to monitor H&H and platelets ? ? ? ?Thank you for allowing pharmacy to be a part of this patient?s care. ? ?Ardyth Harps, PharmD ?Clinical Pharmacist ? ? ? ?

## 2022-04-10 LAB — CBC WITH DIFFERENTIAL/PLATELET
Abs Immature Granulocytes: 0.07 10*3/uL (ref 0.00–0.07)
Basophils Absolute: 0 10*3/uL (ref 0.0–0.1)
Basophils Relative: 0 %
Eosinophils Absolute: 0.1 10*3/uL (ref 0.0–0.5)
Eosinophils Relative: 2 %
HCT: 34.8 % — ABNORMAL LOW (ref 36.0–46.0)
Hemoglobin: 10.6 g/dL — ABNORMAL LOW (ref 12.0–15.0)
Immature Granulocytes: 1 %
Lymphocytes Relative: 11 %
Lymphs Abs: 0.9 10*3/uL (ref 0.7–4.0)
MCH: 26.6 pg (ref 26.0–34.0)
MCHC: 30.5 g/dL (ref 30.0–36.0)
MCV: 87.2 fL (ref 80.0–100.0)
Monocytes Absolute: 1 10*3/uL (ref 0.1–1.0)
Monocytes Relative: 11 %
Neutro Abs: 6.6 10*3/uL (ref 1.7–7.7)
Neutrophils Relative %: 75 %
Platelets: 323 10*3/uL (ref 150–400)
RBC: 3.99 MIL/uL (ref 3.87–5.11)
RDW: 15.9 % — ABNORMAL HIGH (ref 11.5–15.5)
WBC: 8.7 10*3/uL (ref 4.0–10.5)
nRBC: 0.2 % (ref 0.0–0.2)

## 2022-04-10 LAB — COMPREHENSIVE METABOLIC PANEL
ALT: 9 U/L (ref 0–44)
AST: 12 U/L — ABNORMAL LOW (ref 15–41)
Albumin: 2.4 g/dL — ABNORMAL LOW (ref 3.5–5.0)
Alkaline Phosphatase: 82 U/L (ref 38–126)
Anion gap: 10 (ref 5–15)
BUN: 8 mg/dL (ref 6–20)
CO2: 21 mmol/L — ABNORMAL LOW (ref 22–32)
Calcium: 8.5 mg/dL — ABNORMAL LOW (ref 8.9–10.3)
Chloride: 102 mmol/L (ref 98–111)
Creatinine, Ser: 0.72 mg/dL (ref 0.44–1.00)
GFR, Estimated: >60 mL/min (ref >60)
Glucose, Bld: 107 mg/dL — ABNORMAL HIGH (ref 70–99)
Potassium: 3.8 mmol/L (ref 3.5–5.1)
Sodium: 133 mmol/L — ABNORMAL LOW (ref 135–145)
Total Bilirubin: 1.2 mg/dL (ref 0.3–1.2)
Total Protein: 8.1 g/dL (ref 6.5–8.1)

## 2022-04-10 LAB — PROTIME-INR
INR: 2.3 — ABNORMAL HIGH (ref 0.8–1.2)
Prothrombin Time: 25.4 seconds — ABNORMAL HIGH (ref 11.4–15.2)

## 2022-04-10 LAB — GLUCOSE, CAPILLARY
Glucose-Capillary: 106 mg/dL — ABNORMAL HIGH (ref 70–99)
Glucose-Capillary: 152 mg/dL — ABNORMAL HIGH (ref 70–99)
Glucose-Capillary: 115 mg/dL — ABNORMAL HIGH (ref 70–99)
Glucose-Capillary: 136 mg/dL — ABNORMAL HIGH (ref 70–99)

## 2022-04-10 LAB — MAGNESIUM: Magnesium: 1.8 mg/dL (ref 1.7–2.4)

## 2022-04-10 LAB — STREP PNEUMONIAE URINARY ANTIGEN: Strep Pneumo Urinary Antigen: NEGATIVE

## 2022-04-10 LAB — EXPECTORATED SPUTUM ASSESSMENT W GRAM STAIN, RFLX TO RESP C

## 2022-04-10 MED ORDER — ACETAMINOPHEN 500 MG PO TABS
500.0000 mg | ORAL_TABLET | Freq: Once | ORAL | Status: AC
  Administered 2022-04-10: 500 mg via ORAL
  Filled 2022-04-10: qty 1

## 2022-04-10 MED ORDER — ZOLPIDEM TARTRATE 5 MG PO TABS
5.0000 mg | ORAL_TABLET | Freq: Every evening | ORAL | Status: DC | PRN
  Administered 2022-04-10: 5 mg via ORAL
  Filled 2022-04-10: qty 1

## 2022-04-10 MED ORDER — WARFARIN SODIUM 5 MG PO TABS
5.0000 mg | ORAL_TABLET | Freq: Once | ORAL | Status: AC
  Administered 2022-04-10: 5 mg via ORAL
  Filled 2022-04-10: qty 1

## 2022-04-10 MED ORDER — DIPHENHYDRAMINE HCL 25 MG PO CAPS
25.0000 mg | ORAL_CAPSULE | Freq: Once | ORAL | Status: AC
  Administered 2022-04-10: 25 mg via ORAL
  Filled 2022-04-10: qty 1

## 2022-04-10 NOTE — Progress Notes (Signed)
ANTICOAGULATION CONSULT NOTE ? ?Pharmacy Consult for Warfarin ?Indication: pulmonary embolus and DVT ? ?Allergies  ?Allergen Reactions  ? Adhesive [Tape] Anaphylaxis and Swelling  ?  *Adhesive Spray*   ? ? ?Patient Measurements: ?Height: 5\' 4"  (162.6 cm) ?Weight: (!) 220 kg (485 lb) ?IBW/kg (Calculated) : 54.7 ? ?Vital Signs: ?Temp: 97.2 ?F (36.2 ?C) (04/19 12-09-1994) ?Temp Source: Oral (04/19 12-09-1994) ?BP: 128/81 (04/19 12-09-1994) ?Pulse Rate: 98 (04/19 0808) ? ?Labs: ?Recent Labs  ?  04/07/22 ?2341 04/08/22 ?0422 04/08/22 ?1130 04/09/22 ?0133 04/10/22 ?0105  ?HGB 10.7*  --   --  9.7* 10.6*  ?HCT 33.9*  --   --  31.4* 34.8*  ?PLT 315  --   --  281 323  ?LABPROT 21.1*  --  22.5* 24.2* 25.4*  ?INR 1.8*  --  2.0* 2.2* 2.3*  ?CREATININE 0.83  --   --  0.78 0.72  ?TROPONINIHS 7 7  --   --   --   ? ? ?Estimated Creatinine Clearance: 164 mL/min (by C-G formula based on SCr of 0.72 mg/dL). ? ? ?PTA [facility]in [facility]:  ?- 5mg  daily except 2.5mg  on Mon/Fri (last dose 4/16 PTA) ? ?Assessment: ?13 yof with hx of DVT/PE (2020) on warfarin PTA presenting with SOB/palpitations. D-dimer 4.82 on presentation. Due to timing of contrast administration, CTA with limited evaluation of pulmonary arteries and could not rule out PE.  ? ?Per discussion with MD, not overly concerned for acute clot. She discussed with Oncology and likely will not re-scan due to necessity for lifelong anticoagulation anyway.  ? ?INR now therapeutic at 2.3. Hgb 10.6, platelets 323, no s/sx bleeding reported. Patient did receive azithromycin 1x dose 4/17, will monitor for increase in INR.  ?  ? Goal of Therapy:  ?INR 2-3 ?Monitor platelets by anticoagulation protocol: Yes ?  ?Plan:  ?Give warfarin 5 mg PO x1 dose (PTA regimen) ?Check INR daily while on warfarin ?Continue to monitor H&H and platelets ? ? ? ?Thank you for allowing pharmacy to be a part of this patient?s care. ? ?52, PharmD ?Clinical Pharmacist ? ? ? ?

## 2022-04-10 NOTE — Progress Notes (Signed)
The patient is requesting for Tylenol PM instead of  Ambien. Notified Dr. Arlean Hopping and received new order.  ?

## 2022-04-10 NOTE — Progress Notes (Signed)
?                                  PROGRESS NOTE                                             ?                                                                                                                     ?                                         ? ? Patient Demographics:  ? ? Susan Warren, is a 46 y.o. female, DOB - [birth year], [id number] ? ?Outpatient Primary MD for the patient is Elsie Stain, MD    LOS - 1  Admit date - 04/07/2022   ? ?Chief Complaint  ?Patient presents with  ? Palpitations  ? Shortness of Breath  ?    ? ?Brief Narrative (HPI from H&P)   46 y.o. female with medical history significant of HTN; super morbid obesity (BMI 83); DVT/PE (2020) on Coumadin; and OSA presenting with SOB, palpitations.  She reports that she has been feeling "bad" for last few days.  She can't really qualify it further.  She developed some SOB yesterday.  No cough.  No fever.  She was diagnosed with possible pneumonia and admitted to the hospital. ? ? Subjective:  ? ? Susan Warren denies any abdominal pain, no chest pain, she denies any dyspnea but reports some cough.  H ? ? Assessment  & Plan :  ? ?Pneumonia.  CT evidence of possible pneumonia, CT chest significant for mild patchy groundglass appearance of the lung parenchyma, continue the patient on empiric IV antibiotics and monitor.  She was encouraged to sit up in the chair, and use incentive spirometer . ? ?2.  History of PE.  On Coumadin, INR was slightly subtherapeutic, CTA although limited was negative, D-dimer is elevated and will check lower extremity venous duplex just for diagnostic purposes,  ?-On warfarin, dosed by pharmacy . ? ?3.  Hypertension.  On Norvasc. ? ?4.  Morbid obesity with BMI of 83.  Follow with PCP for weight loss. ? ?5.  Chronic lymphedema.  Supportive care. ? ?   ? ?Condition - Fair ? ?Family Communication  :  None present ? ?Code Status :  Full ? ?Consults  :  None ? ?PUD Prophylaxis :  ? ?  Procedures  :    ? ?CTA - 1. Mild patchy ground-glass appearance of the lung parenchyma which may represent sequelae associated with mild pulmonary edema. 2. Hepatomegaly with hepatic steatosis. 3. Limited evaluation of the pulmonary arteries, as  described above. As result, pulmonary embolism cannot be excluded. Correlation with follow-up chest CTA is recommended if this remains of clinical concern. ? ?Leg [place] -  ? ?   ? ?Disposition Plan  :   ? ?Status is: Observation ? ?DVT Prophylaxis  :   ? ? ?warfarin (COUMADIN) tablet 5 mg  ? ?Lab Results  ?Component Value Date  ? PLT 323 04/10/2022  ? ?Lab Results  ?Component Value Date  ? INR 2.3 (H) 04/10/2022  ? INR 2.2 (H) 04/09/2022  ? INR 2.0 (H) 04/08/2022  ? ? ?Diet :  ?Diet Order   ? ?       ?  Diet regular Room service appropriate? Yes; Fluid consistency: Thin  Diet effective now       ?  ? ?  ?  ? ?  ?  ? ?Inpatient Medications ? ?Scheduled Meds: ? amLODipine  10 mg Oral Daily  ? docusate sodium  100 mg Oral BID  ? insulin aspart  0-15 Units Subcutaneous TID WC  ? insulin aspart  0-5 Units Subcutaneous QHS  ? sodium chloride flush  3 mL Intravenous Q12H  ? warfarin  5 mg Oral ONCE-1600  ? Warfarin - Pharmacist Dosing Inpatient   Does not apply [id number]  ? ?Continuous Infusions: ? cefTRIAXone (ROCEPHIN)  IV 2 g (04/10/22 0413)  ? ?PRN Meds:.acetaminophen **OR** acetaminophen, albuterol, bisacodyl, hydrALAZINE, ondansetron **OR** ondansetron (ZOFRAN) IV, polyethylene glycol, zolpidem ? ?Antibiotics  :   ? ?Anti-infectives (From admission, onward)  ? ? Start     Dose/Rate Route Frequency Ordered Stop  ? 04/09/22 0400  cefTRIAXone (ROCEPHIN) 2 g in sodium chloride 0.9 % 100 mL IVPB       ? 2 g ?200 mL/hr over 30 Minutes Intravenous Every 24 hours 04/08/22 1945    ? 04/08/22 0415  cefTRIAXone (ROCEPHIN) 1 g in sodium chloride 0.9 % 100 mL IVPB       ? 1 g ?200 mL/hr over 30 Minutes Intravenous  Once 04/08/22 0401 04/08/22 0642  ? 04/08/22 0415  azithromycin (ZITHROMAX) 500  mg in sodium chloride 0.9 % 250 mL IVPB       ? 500 mg ?250 mL/hr over 60 Minutes Intravenous  Once 04/08/22 0401 04/08/22 0849  ? ?  ? ? ? Time Spent in minutes  30 ? ? ?Phillips Climes M.D on 04/10/2022 at 3:35 PM ? ?To page go to [email or web address]  ? ?[facility] -  Office  [phone] ? ?See all Orders from today for further details ? ? ? Objective:  ? ?Vitals:  ? 04/10/22 0037 04/10/22 0332 04/10/22 0808 04/10/22 1136  ?BP: (!) 160/71 130/64 128/81 131/68  ?Pulse: 100  98 100  ?Resp: 20 16 17 17   ?Temp: 98.6 ?F (37 ?C) 98.9 ?F (37.2 ?C) (!) 97.2 ?F (36.2 ?C) 98.1 ?F (36.7 ?C)  ?TempSrc: Oral Oral Oral Oral  ?SpO2: 98%  100% 98%  ?Weight:      ?Height:      ? ? ?Wt Readings from Last 3 Encounters:  ?04/07/22 (!) 220 kg  ?07/20/19 (!) 222.6 kg  ?05/18/19 (!) 229.1 kg  ? ? ? ?Intake/Output Summary (Last 24 hours) at 04/10/2022 1535 ?Last data filed at 04/10/2022 0600 ?Gross per 24 hour  ?Intake 1967.13 ml  ?Output --  ?Net 1967.13 ml  ? ? ? ?Physical Exam ? ?Awake Alert, Oriented X 3, No new F.N deficits, Normal affect ?Symmetrical Chest wall movement, Good air movement bilaterally, CTAB ?RRR,No Gallops,Rubs  or new Murmurs, No Parasternal Heave ?+ve B.Sounds, Abd Soft, No tenderness, No rebound - guarding or rigidity. ?No Cyanosis, Clubbing or edema, No new Rash or bruise   ? ?  ?  ? ? Data Review:  ? ? ?CBC ?Recent Labs  ?Lab 04/07/22 ?2341 04/09/22 ?0133 04/10/22 ?0105  ?WBC 11.6* 8.7 8.7  ?HGB 10.7* 9.7* 10.6*  ?HCT 33.9* 31.4* 34.8*  ?PLT 315 281 323  ?MCV 86.5 86.7 87.2  ?MCH 27.3 26.8 26.6  ?MCHC 31.6 30.9 30.5  ?RDW 15.6* 15.6* 15.9*  ?LYMPHSABS 1.4  --  0.9  ?MONOABS 1.1*  --  1.0  ?EOSABS 0.0  --  0.1  ?BASOSABS 0.0  --  0.0  ? ? ?Electrolytes ?Recent Labs  ?Lab 04/07/22 ?2341 04/08/22 ?0422 04/08/22 ?1130 04/09/22 ?0133 04/10/22 ?0105  ?NA 133*  --   --  134* 133*  ?K 3.2*  --   --  3.3* 3.8  ?CL 101  --   --  100 102  ?CO2 23  --   --  22 21*  ?GLUCOSE 126*  --   --  120* 107*  ?BUN 7  --   --  7 8   ?CREATININE 0.83  --   --  0.78 0.72  ?CALCIUM 8.3*  --   --  8.0* 8.5*  ?AST 13*  --   --  12* 12*  ?ALT 9  --   --  10 9  ?ALKPHOS 68  --   --  67 82  ?BILITOT 2.3*  --   --  1.6* 1.2  ?ALBUMIN 2.6*  --   --  2.2* 2.4*  ?MG  --   --   --  1.7 1.8  ?DDIMER 4.82*  --   --   --   --   ?INR 1.8*  --  2.0* 2.2* 2.3*  ?HGBA1C  --  5.8*  --   --   --   ?BNP 76.1  --   --  68.8  --   ? ? ?------------------------------------------------------------------------------------------------------------------ ?No results for input(s): CHOL, HDL, LDLCALC, TRIG, CHOLHDL, LDLDIRECT in the last 72 hours. ? ?Lab Results  ?Component Value Date  ? HGBA1C 5.8 (H) 04/08/2022  ? ? ?No results for input(s): TSH, T4TOTAL, T3FREE, THYROIDAB in the last 72 hours. ? ?Invalid input(s): FREET3 ?------------------------------------------------------------------------------------------------------------------ ?ID Labs ?Recent Labs  ?Lab 04/07/22 ?2341 04/09/22 ?0133 04/10/22 ?0105  ?WBC 11.6* 8.7 8.7  ?PLT 315 281 323  ?DDIMER 4.82*  --   --   ?CREATININE 0.83 0.78 0.72  ? ? ?Radiology Reports ?CT Angio Chest PE W and/or Wo Contrast ? ?Result Date: 04/08/2022 ?CLINICAL DATA:  Palpitations and intermittent shortness of breath. EXAM: CT ANGIOGRAPHY CHEST WITH CONTRAST TECHNIQUE: Multidetector CT imaging of the chest was performed using the standard protocol during bolus administration of intravenous contrast. Multiplanar CT image reconstructions and MIPs were obtained to evaluate the vascular anatomy. RADIATION DOSE REDUCTION: This exam was performed according to the departmental dose-optimization program which includes automated exposure control, adjustment of the mA and/or kV according to patient size and/or use of iterative reconstruction technique. CONTRAST:  54mL OMNIPAQUE IOHEXOL 350 MG/ML SOLN COMPARISON:  None. FINDINGS: Cardiovascular: There is no evidence of aortic aneurysm. The pulmonary arteries are markedly limited in evaluation  secondary to suboptimal opacification with intravenous contrast. Normal heart size. No pericardial effusion. Mediastinum/Nodes: No enlarged mediastinal, hilar, or axillary lymph nodes. Thyroid gland, trachea, and eso

## 2022-04-10 NOTE — Progress Notes (Signed)
The patient  is requesting for something to sleep. She said she hasn't slept for about 3 days. Notified Dr. Julian Reil and received an order for Ambien  5 mg PRN. Will implement the order and continue to monitor. ?

## 2022-04-11 ENCOUNTER — Other Ambulatory Visit (HOSPITAL_COMMUNITY): Payer: Self-pay

## 2022-04-11 LAB — COMPREHENSIVE METABOLIC PANEL
ALT: 10 U/L (ref 0–44)
AST: 12 U/L — ABNORMAL LOW (ref 15–41)
Albumin: 2.1 g/dL — ABNORMAL LOW (ref 3.5–5.0)
Alkaline Phosphatase: 75 U/L (ref 38–126)
Anion gap: 9 (ref 5–15)
BUN: 6 mg/dL (ref 6–20)
CO2: 23 mmol/L (ref 22–32)
Calcium: 8.2 mg/dL — ABNORMAL LOW (ref 8.9–10.3)
Chloride: 101 mmol/L (ref 98–111)
Creatinine, Ser: 0.71 mg/dL (ref 0.44–1.00)
GFR, Estimated: >60 mL/min (ref >60)
Glucose, Bld: 135 mg/dL — ABNORMAL HIGH (ref 70–99)
Potassium: 3.8 mmol/L (ref 3.5–5.1)
Sodium: 133 mmol/L — ABNORMAL LOW (ref 135–145)
Total Bilirubin: 0.6 mg/dL (ref 0.3–1.2)
Total Protein: 7.3 g/dL (ref 6.5–8.1)

## 2022-04-11 LAB — PROTIME-INR
INR: 2.8 — ABNORMAL HIGH (ref 0.8–1.2)
Prothrombin Time: 28.9 seconds — ABNORMAL HIGH (ref 11.4–15.2)

## 2022-04-11 LAB — CBC WITH DIFFERENTIAL/PLATELET
Abs Immature Granulocytes: 0.13 10*3/uL — ABNORMAL HIGH (ref 0.00–0.07)
Basophils Absolute: 0 10*3/uL (ref 0.0–0.1)
Basophils Relative: 0 %
Eosinophils Absolute: 0.2 10*3/uL (ref 0.0–0.5)
Eosinophils Relative: 2 %
HCT: 31 % — ABNORMAL LOW (ref 36.0–46.0)
Hemoglobin: 9.5 g/dL — ABNORMAL LOW (ref 12.0–15.0)
Immature Granulocytes: 1 %
Lymphocytes Relative: 11 %
Lymphs Abs: 1 10*3/uL (ref 0.7–4.0)
MCH: 26.8 pg (ref 26.0–34.0)
MCHC: 30.6 g/dL (ref 30.0–36.0)
MCV: 87.3 fL (ref 80.0–100.0)
Monocytes Absolute: 1 10*3/uL (ref 0.1–1.0)
Monocytes Relative: 10 %
Neutro Abs: 7.2 10*3/uL (ref 1.7–7.7)
Neutrophils Relative %: 76 %
Platelets: 344 10*3/uL (ref 150–400)
RBC: 3.55 MIL/uL — ABNORMAL LOW (ref 3.87–5.11)
RDW: 15.9 % — ABNORMAL HIGH (ref 11.5–15.5)
WBC: 9.6 10*3/uL (ref 4.0–10.5)
nRBC: 0 % (ref 0.0–0.2)

## 2022-04-11 LAB — LEGIONELLA PNEUMOPHILA SEROGP 1 UR AG: L. pneumophila Serogp 1 Ur Ag: NEGATIVE

## 2022-04-11 LAB — GLUCOSE, CAPILLARY: Glucose-Capillary: 120 mg/dL — ABNORMAL HIGH (ref 70–99)

## 2022-04-11 LAB — MAGNESIUM: Magnesium: 1.7 mg/dL (ref 1.7–2.4)

## 2022-04-11 MED ORDER — AMOXICILLIN-POT CLAVULANATE 875-125 MG PO TABS
1.0000 | ORAL_TABLET | Freq: Two times a day (BID) | ORAL | 0 refills | Status: DC
  Filled 2022-04-11: qty 6, 3d supply, fill #0

## 2022-04-11 MED ORDER — ACETAMINOPHEN 325 MG PO TABS: 650.0000 mg | ORAL_TABLET | Freq: Four times a day (QID) | ORAL | Status: AC | PRN

## 2022-04-11 MED ORDER — WARFARIN SODIUM 2.5 MG PO TABS: 2.5000 mg | ORAL_TABLET | Freq: Once | ORAL | Status: DC

## 2022-04-11 NOTE — Discharge Instructions (Signed)
Follow with Primary MD Elsie Stain, MD in 14 days  ? ?Get CBC, CMP, 2 view Chest X ray checked  by Primary MD next visit.  ? ? ?Activity: As tolerated with Full fall precautions use walker/cane & assistance as needed ? ? ?Disposition Home  ? ? ?Diet: Heart Healthy , with feeding assistance and aspiration precautions. ? ? ?On your next visit with your primary care physician please Get Medicines reviewed and adjusted. ? ? ?Please request your Prim.MD to go over all Hospital Tests and Procedure/Radiological results at the follow up, please get all Hospital records sent to your Prim MD by signing hospital release before you go home. ? ? ?If you experience worsening of your admission symptoms, develop shortness of breath, life threatening emergency, suicidal or homicidal thoughts you must seek medical attention immediately by calling 911 or calling your MD immediately  if symptoms less severe. ? ?You Must read complete instructions/literature along with all the possible adverse reactions/side effects for all the Medicines you take and that have been prescribed to you. Take any new Medicines after you have completely understood and accpet all the possible adverse reactions/side effects.  ? ?Do not drive, operating heavy machinery, perform activities at heights, swimming or participation in water activities or provide baby sitting services if your were admitted for syncope or siezures until you have seen by Primary MD or a Neurologist and advised to do so again. ? ?Do not drive when taking Pain medications.  ? ? ?Do not take more than prescribed Pain, Sleep and Anxiety Medications ? ?Special Instructions: If you have smoked or chewed Tobacco  in the last 2 yrs please stop smoking, stop any regular Alcohol  and or any Recreational drug use. ? ?Wear Seat belts while driving. ? ? ?Please note ? ?You were cared for by a hospitalist during your hospital stay. If you have any questions about your discharge medications or  the care you received while you were in the hospital after you are discharged, you can call the unit and asked to speak with the hospitalist on call if the hospitalist that took care of you is not available. Once you are discharged, your primary care physician will handle any further medical issues. Please note that NO REFILLS for any discharge medications will be authorized once you are discharged, as it is imperative that you return to your primary care physician (or establish a relationship with a primary care physician if you do not have one) for your aftercare needs so that they can reassess your need for medications and monitor your lab values.  ?

## 2022-04-11 NOTE — Discharge Summary (Addendum)
Physician Discharge Summary  ?Susan Warren [id number] DOB: 12-25-1975 DOA: 04/07/2022 ? ?PCP: Elsie Stain, MD ? ?Admit date: 04/07/2022 ?Discharge date: 04/11/2022 ? ?Admitted From: Home ?Disposition:  Home  ? ?Recommendations for Outpatient Follow-up:  ?Follow up with PCP in 2 weeks ? ?Home Health:NO ? ? ?Discharge Condition:Stable ?CODE STATUS:FULL ?Diet recommendation: Heart Healthy ? ? ? ?Brief/Interim Summary: ? ?46 y.o. female with medical history significant of HTN; super morbid obesity (BMI 83); DVT/PE (2020) on Coumadin; and OSA presenting with SOB, palpitations.  She reports that she has been feeling "bad" for last few days.  She can't really qualify it further.  She was noted to have fever of 101.7, Significant for hip pneumonia, admitted for further work-up. ? ?Pneumonia.  ?-  CT evidence of possible pneumonia, CT chest significant for mild patchy groundglass appearance of the lung parenchyma, she was treated with IV antibiotic during hospital stay, she was encouraged to use incentive spirometry, and to ambulate, he will be transition to Augmentin on discharge to finish total of 5 days treatment . ?  ?History of PE.   ?- On Coumadin, INR was slightly subtherapeutic on admission, so CTA was obtained, CTA although limited was negative, D-dimer is elevated lower extremity venous Dopplers negative for DVT, INR is therapeutic at 2.8 at time of discharge. ? ?Hypertension.  On Norvasc. ?  ?Morbid obesity with BMI of 83.  Follow with PCP for weight loss. ?  ?Chronic lymphedema.  Supportive care. ?  ? ? ? ? ?Discharge Diagnoses:  ?Principal Problem: ?  Fever ?Active Problems: ?  Hypertension ?  History of pulmonary embolism ?  Lymphedema of both lower extremities ?  Obesity, morbid, BMI 50 or higher ([place]) ?  Pneumonia ? ? ? ?Discharge Instructions ? ?Discharge Instructions   ? ? Diet - low sodium heart healthy   Complete by: As directed ?  ? Discharge instructions   Complete by: As directed ?  ? Follow  with Primary MD Elsie Stain, MD in 14 days  ? ?Get CBC, CMP, 2 view Chest X ray checked  by Primary MD next visit.  ? ? ?Activity: As tolerated with Full fall precautions use walker/cane & assistance as needed ? ? ?Disposition Home  ? ? ?Diet: Heart Healthy , with feeding assistance and aspiration precautions. ? ? ?On your next visit with your primary care physician please Get Medicines reviewed and adjusted. ? ? ?Please request your Prim.MD to go over all Hospital Tests and Procedure/Radiological results at the follow up, please get all Hospital records sent to your Prim MD by signing hospital release before you go home. ? ? ?If you experience worsening of your admission symptoms, develop shortness of breath, life threatening emergency, suicidal or homicidal thoughts you must seek medical attention immediately by calling 911 or calling your MD immediately  if symptoms less severe. ? ?You Must read complete instructions/literature along with all the possible adverse reactions/side effects for all the Medicines you take and that have been prescribed to you. Take any new Medicines after you have completely understood and accpet all the possible adverse reactions/side effects.  ? ?Do not drive, operating heavy machinery, perform activities at heights, swimming or participation in water activities or provide baby sitting services if your were admitted for syncope or siezures until you have seen by Primary MD or a Neurologist and advised to do so again. ? ?Do not drive when taking Pain medications.  ? ? ?Do not take more than prescribed  Pain, Sleep and Anxiety Medications ? ?Special Instructions: If you have smoked or chewed Tobacco  in the last 2 yrs please stop smoking, stop any regular Alcohol  and or any Recreational drug use. ? ?Wear Seat belts while driving. ? ? ?Please note ? ?You were cared for by a hospitalist during your hospital stay. If you have any questions about your discharge medications or the care  you received while you were in the hospital after you are discharged, you can call the unit and asked to speak with the hospitalist on call if the hospitalist that took care of you is not available. Once you are discharged, your primary care physician will handle any further medical issues. Please note that NO REFILLS for any discharge medications will be authorized once you are discharged, as it is imperative that you return to your primary care physician (or establish a relationship with a primary care physician if you do not have one) for your aftercare needs so that they can reassess your need for medications and monitor your lab values.  ? Increase activity slowly   Complete by: As directed ?  ? ?  ? ?Allergies as of 04/11/2022   ? ?   Reactions  ? Adhesive [tape] Anaphylaxis, Swelling  ? *Adhesive Spray*   ? ?  ? ?  ?Medication List  ?  ? ?TAKE these medications   ? ?acetaminophen 325 MG tablet ?Commonly known as: TYLENOL ?Take 2 tablets (650 mg total) by mouth every 6 (six) hours as needed for mild pain (or Fever >/= 101). ?What changed:  ?medication strength ?how much to take ?reasons to take this ?  ?amLODipine 10 MG tablet ?Commonly known as: NORVASC ?TAKE 1 TABLET (10 MG TOTAL) BY MOUTH DAILY. PLEASE MAKE AN APPOINTMENT TO BE SEEN BY YOUR PCP. ?What changed: additional instructions ?  ?amoxicillin-clavulanate 875-125 MG tablet ?Commonly known as: Augmentin ?Take 1 tablet by mouth 2 (two) times daily. ?  ?warfarin 5 MG tablet ?Commonly known as: COUMADIN ?Take as directed. If you are unsure how to take this medication, talk to your nurse or doctor. ?Original instructions: Take as directed by the [facility]madin Clinic. ?What changed:  ?how much to take ?how to take this ?when to take this ?additional instructions ?  ? ?  ? ? ?Allergies  ?Allergen Reactions  ? Adhesive [Tape] Anaphylaxis and Swelling  ?  *Adhesive Spray*   ? ? ?Consultations: ?none ? ? ?Procedures/Studies: ?CT Angio Chest PE W and/or Wo  Contrast ? ?Result Date: 04/08/2022 ?CLINICAL DATA:  Palpitations and intermittent shortness of breath. EXAM: CT ANGIOGRAPHY CHEST WITH CONTRAST TECHNIQUE: Multidetector CT imaging of the chest was performed using the standard protocol during bolus administration of intravenous contrast. Multiplanar CT image reconstructions and MIPs were obtained to evaluate the vascular anatomy. RADIATION DOSE REDUCTION: This exam was performed according to the departmental dose-optimization program which includes automated exposure control, adjustment of the mA and/or kV according to patient size and/or use of iterative reconstruction technique. CONTRAST:  80mL OMNIPAQUE IOHEXOL 350 MG/ML SOLN COMPARISON:  None. FINDINGS: Cardiovascular: There is no evidence of aortic aneurysm. The pulmonary arteries are markedly limited in evaluation secondary to suboptimal opacification with intravenous contrast. Normal heart size. No pericardial effusion. Mediastinum/Nodes: No enlarged mediastinal, hilar, or axillary lymph nodes. Thyroid gland, trachea, and esophagus demonstrate no significant findings. Lungs/Pleura: Mild patchy ground-glass appearance of the lung parenchyma is seen. There is no evidence of focal consolidation, pleural effusion or pneumothorax. Upper Abdomen: There is hepatomegaly  with diffuse fatty infiltration of the liver parenchyma. Musculoskeletal: Dilated, tortuous vessels are seen throughout the subcutaneous fat of the anterior chest wall on the right. These are seen on the prior study and increased in caliber on the current exam no acute or significant osseous findings. Review of the MIP images confirms the above findings. IMPRESSION: 1. Mild patchy ground-glass appearance of the lung parenchyma which may represent sequelae associated with mild pulmonary edema. 2. Hepatomegaly with hepatic steatosis. 3. Limited evaluation of the pulmonary arteries, as described above. As result, pulmonary embolism cannot be excluded.  Correlation with follow-up chest CTA is recommended if this remains of clinical concern. Electronically Signed   By: Virgina Norfolk M.D.   On: 04/08/2022 03:42  ? ?DG Chest Portable 1 View ? ?Result Date: 4/16/202

## 2022-04-11 NOTE — Progress Notes (Signed)
[facility] night cross cover note: ? ?I was notified by RN of patient's request for modifications to her existing Sleep Aid order. ? ?Specifically, the patient is requesting that she receive Tylenol PM for assistance sleeping as opposed to the existing order for prn Ambien. ? ?Consequently, I discontinued the existing order for Ambien, and instead placed one-time orders for acetaminophen 500 mg p.o. as well as Benadryl 25 mg p.o. ? ? ? ?Newton Pigg, DO ?Hospitalist ? ?

## 2022-04-11 NOTE — Progress Notes (Signed)
ANTICOAGULATION CONSULT NOTE ? ?Pharmacy Consult for Warfarin ?Indication: pulmonary embolus and DVT ? ?Allergies  ?Allergen Reactions  ? Adhesive [Tape] Anaphylaxis and Swelling  ?  *Adhesive Spray*   ? ? ?Patient Measurements: ?Height: 5\' 4"  (162.6 cm) ?Weight: (!) 220 kg (485 lb) ?IBW/kg (Calculated) : 54.7 ? ?Vital Signs: ?Temp: 98.7 ?F (37.1 ?C) (04/20 05-23-2005) ?Temp Source: Oral (04/20 0736) ?BP: 127/61 (04/20 0736) ?Pulse Rate: 97 (04/20 0736) ? ?Labs: ?Recent Labs  ?  04/09/22 ?0133 04/10/22 ?0105 04/11/22 ?0113  ?HGB 9.7* 10.6* 9.5*  ?HCT 31.4* 34.8* 31.0*  ?PLT 281 323 344  ?LABPROT 24.2* 25.4* 28.9*  ?INR 2.2* 2.3* 2.8*  ?CREATININE 0.78 0.72 0.71  ? ? ?Estimated Creatinine Clearance: 164 mL/min (by C-G formula based on SCr of 0.71 mg/dL). ? ? ?PTA Warfarin [facility]:  ?- 5mg  daily except 2.5mg  on Mon/Fri (last dose 4/16 PTA) ? ?Assessment: ?79 yof with hx of DVT/PE (2020) on warfarin PTA presenting with SOB/palpitations. D-dimer 4.82 on presentation. Due to timing of contrast administration, CTA with limited evaluation of pulmonary arteries and could not rule out PE.  ? ?Per discussion with MD, not overly concerned for acute clot. She discussed with Oncology and likely will not re-scan due to necessity for lifelong anticoagulation anyway.  ? ?INR now therapeutic but with significant increase from 2.3 > 2.8. CBC stable: Hgb 9.5, platelets 344, no s/sx bleeding reported. Patient did receive azithromycin 1x dose 4/17, will continue to monitor for increases in INR.  ?  ? Goal of Therapy:  ?INR 2-3 ?Monitor platelets by anticoagulation protocol: Yes ?  ?Plan:  ?Give warfarin 2.5 mg PO x1 dose  ?Check INR daily while on warfarin ?Continue to monitor H&H and platelets ? ? ? ?Thank you for allowing pharmacy to be a part of this patient?s care. ? ?52, PharmD ?Clinical Pharmacist ? ? ? ?

## 2022-04-11 NOTE — H&P (Signed)
? ?  [facility] ?[place] ?[id number] [facility] ?[place] [address] ?Phone: [phone] ?Fax: [phone] ? ? ? ?April 11, 2022 ? ?Patient:  Susan Warren ?Date of Birth: June 06, 1976 ?Date of Admission: 04/07/2022 ? ?To Whom it May Concern: ? ?[name] [facility] and discharged on 04/11/22. She may return to work on 04/18/2022 . ? ?If you require additional information, please contact [place] to obtain written authorization to release protected health information.  Federal Health and safety inspector do not permit release of information without signed authorization. [name] may stop by the hospital to complete an Authorization to [place].  Thank you for your cooperation in this regard. ? ?If you have any questions or concerns, please don't hesitate to call. ? ? ?Sincerely, ? ? ? ?Phillips Climes, MD ?[facility] ?

## 2022-04-12 ENCOUNTER — Telehealth: Payer: Self-pay

## 2022-04-12 NOTE — Telephone Encounter (Signed)
Transition Care Management Unsuccessful Follow-up Telephone Call ? ?Date of discharge and from where:  [facility] on 04/11/2022 ? ?Attempts:  1st Attempt ? ?Reason for unsuccessful TCM follow-up call:  Left voice message. Call back requested. ? ?  ?

## 2022-04-15 ENCOUNTER — Telehealth: Payer: Self-pay

## 2022-04-15 ENCOUNTER — Inpatient Hospital Stay (HOSPITAL_COMMUNITY)
Admission: EM | Admit: 2022-04-15 | Discharge: 2022-04-29 | DRG: 871 | Disposition: A | Discharge status: Home Health | Payer: 59 | Attending: Internal Medicine | Admitting: Internal Medicine

## 2022-04-15 ENCOUNTER — Encounter (HOSPITAL_COMMUNITY): Payer: Self-pay

## 2022-04-15 ENCOUNTER — Other Ambulatory Visit: Payer: Self-pay

## 2022-04-15 DIAGNOSIS — E538 Deficiency of other specified B group vitamins: Secondary | ICD-10-CM

## 2022-04-15 DIAGNOSIS — Z6841 Body Mass Index (BMI) 40.0 and over, adult: Secondary | ICD-10-CM

## 2022-04-15 DIAGNOSIS — I10 Essential (primary) hypertension: Secondary | ICD-10-CM | POA: Diagnosis present

## 2022-04-15 DIAGNOSIS — I82401 Acute embolism and thrombosis of unspecified deep veins of right lower extremity: Secondary | ICD-10-CM | POA: Diagnosis present

## 2022-04-15 DIAGNOSIS — R1011 Right upper quadrant pain: Principal | ICD-10-CM

## 2022-04-15 DIAGNOSIS — N739 Female pelvic inflammatory disease, unspecified: Secondary | ICD-10-CM | POA: Diagnosis present

## 2022-04-15 DIAGNOSIS — Z86711 Personal history of pulmonary embolism: Secondary | ICD-10-CM | POA: Diagnosis present

## 2022-04-15 DIAGNOSIS — B001 Herpesviral vesicular dermatitis: Secondary | ICD-10-CM

## 2022-04-15 DIAGNOSIS — K219 Gastro-esophageal reflux disease without esophagitis: Secondary | ICD-10-CM | POA: Diagnosis present

## 2022-04-15 DIAGNOSIS — Z79899 Other long term (current) drug therapy: Secondary | ICD-10-CM

## 2022-04-15 DIAGNOSIS — I82411 Acute embolism and thrombosis of right femoral vein: Secondary | ICD-10-CM

## 2022-04-15 DIAGNOSIS — K651 Peritoneal abscess: Secondary | ICD-10-CM | POA: Diagnosis present

## 2022-04-15 DIAGNOSIS — D72829 Elevated white blood cell count, unspecified: Secondary | ICD-10-CM

## 2022-04-15 DIAGNOSIS — Z833 Family history of diabetes mellitus: Secondary | ICD-10-CM

## 2022-04-15 DIAGNOSIS — L02211 Cutaneous abscess of abdominal wall: Secondary | ICD-10-CM | POA: Diagnosis present

## 2022-04-15 DIAGNOSIS — Z7901 Long term (current) use of anticoagulants: Secondary | ICD-10-CM

## 2022-04-15 DIAGNOSIS — Z8249 Family history of ischemic heart disease and other diseases of the circulatory system: Secondary | ICD-10-CM

## 2022-04-15 DIAGNOSIS — B377 Candidal sepsis: Principal | ICD-10-CM

## 2022-04-15 DIAGNOSIS — Z803 Family history of malignant neoplasm of breast: Secondary | ICD-10-CM

## 2022-04-15 DIAGNOSIS — D638 Anemia in other chronic diseases classified elsewhere: Secondary | ICD-10-CM | POA: Diagnosis present

## 2022-04-15 DIAGNOSIS — Z9049 Acquired absence of other specified parts of digestive tract: Secondary | ICD-10-CM

## 2022-04-15 DIAGNOSIS — I82501 Chronic embolism and thrombosis of unspecified deep veins of right lower extremity: Secondary | ICD-10-CM | POA: Diagnosis present

## 2022-04-15 DIAGNOSIS — R791 Abnormal coagulation profile: Secondary | ICD-10-CM | POA: Diagnosis present

## 2022-04-15 DIAGNOSIS — E871 Hypo-osmolality and hyponatremia: Secondary | ICD-10-CM

## 2022-04-15 DIAGNOSIS — D649 Anemia, unspecified: Secondary | ICD-10-CM

## 2022-04-15 DIAGNOSIS — Z86718 Personal history of other venous thrombosis and embolism: Secondary | ICD-10-CM

## 2022-04-15 DIAGNOSIS — Z20822 Contact with and (suspected) exposure to covid-19: Secondary | ICD-10-CM | POA: Diagnosis present

## 2022-04-15 DIAGNOSIS — Z823 Family history of stroke: Secondary | ICD-10-CM

## 2022-04-15 DIAGNOSIS — K760 Fatty (change of) liver, not elsewhere classified: Secondary | ICD-10-CM | POA: Diagnosis present

## 2022-04-15 DIAGNOSIS — I890 Lymphedema, not elsewhere classified: Secondary | ICD-10-CM

## 2022-04-15 DIAGNOSIS — R1900 Intra-abdominal and pelvic swelling, mass and lump, unspecified site: Secondary | ICD-10-CM | POA: Diagnosis present

## 2022-04-15 DIAGNOSIS — R109 Unspecified abdominal pain: Secondary | ICD-10-CM | POA: Diagnosis present

## 2022-04-15 DIAGNOSIS — K7460 Unspecified cirrhosis of liver: Secondary | ICD-10-CM | POA: Diagnosis present

## 2022-04-15 LAB — CBC WITH DIFFERENTIAL/PLATELET
Abs Immature Granulocytes: 1.14 10*3/uL — ABNORMAL HIGH (ref 0.00–0.07)
Basophils Absolute: 0.1 10*3/uL (ref 0.0–0.1)
Basophils Relative: 0 %
Eosinophils Absolute: 0.1 10*3/uL (ref 0.0–0.5)
Eosinophils Relative: 1 %
HCT: 37.6 % (ref 36.0–46.0)
Hemoglobin: 11 g/dL — ABNORMAL LOW (ref 12.0–15.0)
Immature Granulocytes: 7 %
Lymphocytes Relative: 7 %
Lymphs Abs: 1.1 10*3/uL (ref 0.7–4.0)
MCH: 26 pg (ref 26.0–34.0)
MCHC: 29.3 g/dL — ABNORMAL LOW (ref 30.0–36.0)
MCV: 88.9 fL (ref 80.0–100.0)
Monocytes Absolute: 0.8 10*3/uL (ref 0.1–1.0)
Monocytes Relative: 4 %
Neutro Abs: 14.4 10*3/uL — ABNORMAL HIGH (ref 1.7–7.7)
Neutrophils Relative %: 81 %
Platelets: 531 10*3/uL — ABNORMAL HIGH (ref 150–400)
RBC: 4.23 MIL/uL (ref 3.87–5.11)
RDW: 15.8 % — ABNORMAL HIGH (ref 11.5–15.5)
WBC: 17.6 10*3/uL — ABNORMAL HIGH (ref 4.0–10.5)
nRBC: 0.1 % (ref 0.0–0.2)

## 2022-04-15 LAB — COMPREHENSIVE METABOLIC PANEL
ALT: 14 U/L (ref 0–44)
AST: 27 U/L (ref 15–41)
Albumin: 2.7 g/dL — ABNORMAL LOW (ref 3.5–5.0)
Alkaline Phosphatase: 96 U/L (ref 38–126)
Anion gap: 11 (ref 5–15)
BUN: 9 mg/dL (ref 6–20)
CO2: 25 mmol/L (ref 22–32)
Calcium: 8.4 mg/dL — ABNORMAL LOW (ref 8.9–10.3)
Chloride: 98 mmol/L (ref 98–111)
Creatinine, Ser: 0.64 mg/dL (ref 0.44–1.00)
GFR, Estimated: >60 mL/min (ref >60)
Glucose, Bld: 107 mg/dL — ABNORMAL HIGH (ref 70–99)
Potassium: 3.7 mmol/L (ref 3.5–5.1)
Sodium: 134 mmol/L — ABNORMAL LOW (ref 135–145)
Total Bilirubin: 0.8 mg/dL (ref 0.3–1.2)
Total Protein: 8.7 g/dL — ABNORMAL HIGH (ref 6.5–8.1)

## 2022-04-15 LAB — LIPASE, BLOOD: Lipase: 36 U/L (ref 11–51)

## 2022-04-15 NOTE — ED Notes (Signed)
Requested pt give urine sample whenever possible.  ?

## 2022-04-15 NOTE — Telephone Encounter (Signed)
Transition Care Management Unsuccessful Follow-up Telephone Call ? ?Date of discharge and from where:  04/11/2022, [facility]  ? ?Attempts:  2nd Attempt ? ?Reason for unsuccessful TCM follow-up call:  Left voice message on [phone]. Call back requested. ? ?Need to schedule hospital follow up appointment with PCP ? ? ? ?

## 2022-04-15 NOTE — ED Triage Notes (Signed)
Pt BIB EMS with reports of N/V, abdominal pain, and diarrhea. Pt reports that the abdominal pain started Friday and is in her RUQ. Pt begin to vomit today at 1300. Pt has been able to eat small amounts. Pt reports taking abts a week ago for her PNA.  ?

## 2022-04-16 ENCOUNTER — Emergency Department (HOSPITAL_COMMUNITY): Payer: 59

## 2022-04-16 ENCOUNTER — Telehealth: Payer: Self-pay | Admitting: Critical Care Medicine

## 2022-04-16 ENCOUNTER — Encounter (HOSPITAL_COMMUNITY): Payer: Self-pay

## 2022-04-16 ENCOUNTER — Ambulatory Visit (HOSPITAL_COMMUNITY)
Admission: RE | Admit: 2022-04-16 | Discharge: 2022-04-16 | Disposition: A | Discharge status: Home / Self Care | Payer: 59 | Source: Ambulatory Visit

## 2022-04-16 DIAGNOSIS — I10 Essential (primary) hypertension: Secondary | ICD-10-CM

## 2022-04-16 DIAGNOSIS — I890 Lymphedema, not elsewhere classified: Secondary | ICD-10-CM | POA: Diagnosis not present

## 2022-04-16 DIAGNOSIS — R791 Abnormal coagulation profile: Secondary | ICD-10-CM | POA: Diagnosis present

## 2022-04-16 DIAGNOSIS — R109 Unspecified abdominal pain: Secondary | ICD-10-CM

## 2022-04-16 DIAGNOSIS — R1900 Intra-abdominal and pelvic swelling, mass and lump, unspecified site: Secondary | ICD-10-CM

## 2022-04-16 DIAGNOSIS — R1084 Generalized abdominal pain: Secondary | ICD-10-CM

## 2022-04-16 HISTORY — DX: Unspecified abdominal pain: R10.9

## 2022-04-16 LAB — URINALYSIS, ROUTINE W REFLEX MICROSCOPIC
Bilirubin Urine: NEGATIVE
Glucose, UA: NEGATIVE mg/dL
Ketones, ur: NEGATIVE mg/dL
Nitrite: NEGATIVE
Protein, ur: 30 mg/dL — AB
RBC / HPF: >50 RBC/hpf — ABNORMAL HIGH (ref 0–5)
Specific Gravity, Urine: 1.015 (ref 1.005–1.030)
pH: 5 (ref 5.0–8.0)

## 2022-04-16 LAB — PROTIME-INR
INR: 5.7 (ref 0.8–1.2)
INR: 5.7 (ref 0.8–1.2)
Prothrombin Time: 51.3 seconds — ABNORMAL HIGH (ref 11.4–15.2)
Prothrombin Time: 50.8 seconds — ABNORMAL HIGH (ref 11.4–15.2)

## 2022-04-16 LAB — POC URINE PREG, ED: Preg Test, Ur: NEGATIVE

## 2022-04-16 MED ORDER — ACETAMINOPHEN 650 MG RE SUPP: 650.0000 mg | Freq: Four times a day (QID) | RECTAL | Status: DC | PRN

## 2022-04-16 MED ORDER — AMLODIPINE BESYLATE 10 MG PO TABS
10.0000 mg | ORAL_TABLET | Freq: Every day | ORAL | Status: DC
Start: 2022-04-16 — End: 2022-04-29
  Administered 2022-04-16 – 2022-04-28 (×13): 10 mg via ORAL
  Filled 2022-04-16 (×13): qty 1

## 2022-04-16 MED ORDER — SODIUM CHLORIDE 0.9 % IV BOLUS
1000.0000 mL | Freq: Once | INTRAVENOUS | Status: AC
  Administered 2022-04-16: 1000 mL via INTRAVENOUS

## 2022-04-16 MED ORDER — SODIUM CHLORIDE 0.9 % IV SOLN
8.0000 mg | Freq: Four times a day (QID) | INTRAVENOUS | Status: DC | PRN
  Filled 2022-04-16: qty 4

## 2022-04-16 MED ORDER — PIPERACILLIN-TAZOBACTAM 3.375 G IVPB
3.3750 g | Freq: Three times a day (TID) | INTRAVENOUS | Status: DC
  Administered 2022-04-16 – 2022-04-24 (×24): 3.375 g via INTRAVENOUS
  Filled 2022-04-16 (×26): qty 50

## 2022-04-16 MED ORDER — PIPERACILLIN-TAZOBACTAM 3.375 G IVPB 30 MIN: 3.3750 g | Freq: Four times a day (QID) | INTRAVENOUS | Status: DC

## 2022-04-16 MED ORDER — ACETAMINOPHEN 500 MG PO TABS
1000.0000 mg | ORAL_TABLET | Freq: Four times a day (QID) | ORAL | Status: DC | PRN
  Administered 2022-04-24: 1000 mg via ORAL
  Filled 2022-04-16 (×2): qty 2

## 2022-04-16 MED ORDER — HYDROMORPHONE HCL 1 MG/ML IJ SOLN
1.0000 mg | Freq: Once | INTRAMUSCULAR | Status: AC
  Administered 2022-04-16: 1 mg via INTRAVENOUS
  Filled 2022-04-16: qty 1

## 2022-04-16 MED ORDER — HYDROMORPHONE HCL 2 MG/ML IJ SOLN
2.0000 mg | Freq: Once | INTRAMUSCULAR | Status: AC
  Administered 2022-04-16: 2 mg via INTRAVENOUS
  Filled 2022-04-16: qty 1

## 2022-04-16 MED ORDER — HYDROMORPHONE HCL 1 MG/ML IJ SOLN
1.0000 mg | INTRAMUSCULAR | Status: DC | PRN
  Administered 2022-04-16 – 2022-04-19 (×12): 1 mg via INTRAVENOUS
  Filled 2022-04-16 (×14): qty 1

## 2022-04-16 MED ORDER — ONDANSETRON HCL 4 MG/2ML IJ SOLN
4.0000 mg | Freq: Once | INTRAMUSCULAR | Status: AC
Start: 2022-04-16 — End: 2022-04-16
  Administered 2022-04-16: 4 mg via INTRAVENOUS
  Filled 2022-04-16: qty 2

## 2022-04-16 MED ORDER — PIPERACILLIN-TAZOBACTAM 3.375 G IVPB 30 MIN
3.3750 g | Freq: Once | INTRAVENOUS | Status: AC
Start: 2022-04-16 — End: 2022-04-16
  Administered 2022-04-16: 3.375 g via INTRAVENOUS
  Filled 2022-04-16: qty 50

## 2022-04-16 MED ORDER — IOHEXOL 300 MG/ML  SOLN
100.0000 mL | Freq: Once | INTRAMUSCULAR | Status: AC | PRN
  Administered 2022-04-16: 100 mL via INTRAVENOUS

## 2022-04-16 MED ORDER — PANTOPRAZOLE SODIUM 40 MG IV SOLR
40.0000 mg | INTRAVENOUS | Status: DC
  Administered 2022-04-16 – 2022-04-19 (×4): 40 mg via INTRAVENOUS
  Filled 2022-04-16 (×5): qty 10

## 2022-04-16 MED ORDER — SODIUM CHLORIDE (PF) 0.9 % IJ SOLN
INTRAMUSCULAR | Status: AC
  Filled 2022-04-16: qty 50

## 2022-04-16 NOTE — ED Provider Notes (Signed)
?[facility]-EMERGENCY DEPT ?Provider Note ? ? ?CSN: [id number] ?Arrival date & time: 04/15/22  1913 ? ?  ? ?History ? ?Chief Complaint  ?Patient presents with  ? Abdominal Pain  ? Nausea  ? Vomiting  ? Diarrhea  ? ? ?Susan Warren is a 46 y.o. female. ? ?HPI ? 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  Patient's pain started on Thursday and has been consistent.  pain has gotten worse, pain remains her right upper quadrant does not radiate she had associated nausea and vomiting denies hematemesis or coffee-ground emesis, endorses decrease in appetite, denies constipation diarrhea, last bowel movement was a few hours ago still passing gas.  She denies any urinary symptoms dysuria hematuria.  Patient has had a cholecystectomy but denies any other significant medical history denies NSAID use alcohol use history of pancreatitis, denies history of stomach ulcers.  She states that she recently was discharged from the hospital and has been on antibiotics for her pneumonia. ? ? ?Recent discharge from [facility] emergency department with pneumonia, was on IV antibiotics and was discharged home on Augmentin, she is currently on Coumadin for PE, CTA of the chest was negative, elevated D-dimer negative DVT study. ? ? ? ? ?Home Medications ?Prior to Admission medications   ?Medication Sig Start Date End Date Taking? Authorizing Provider  ?acetaminophen (TYLENOL) 325 MG tablet Take 2 tablets (650 mg total) by mouth every 6 (six) hours as needed for mild pain (or Fever >/= 101). 04/11/22   Elgergawy, Leana Roe, MD  ?amLODipine (NORVASC) 10 MG tablet TAKE 1 TABLET (10 MG TOTAL) BY MOUTH DAILY. PLEASE MAKE AN APPOINTMENT TO BE SEEN BY YOUR PCP. ?Patient taking differently: Take 10 mg by mouth daily. 03/13/22   Storm Frisk, MD  ?amoxicillin-clavulanate (AUGMENTIN) 875-125 MG tablet Take 1 tablet by mouth  2 (two) times daily. 04/11/22   Elgergawy, Leana Roe, MD  ?warfarin (COUMADIN) 5 MG tablet Take as directed by the [facility] Clinic. ?Patient taking differently: Take 2.5-5 mg by mouth See admin instructions. 2.5 mg Monday, Friday ?5 mg Tuesday,Wednesday,Thursday,Saturday and sunday 08/02/21   Storm Frisk, MD  ?   ? ?Allergies    ?Adhesive [tape]   ? ?Review of Systems   ?Review of Systems  ?Constitutional:  Positive for appetite change. Negative for chills and fever.  ?Respiratory:  Negative for shortness of breath.   ?Cardiovascular:  Negative for chest pain.  ?Gastrointestinal:  Positive for abdominal pain, nausea and vomiting. Negative for diarrhea.  ?Neurological:  Negative for headaches.  ? ?Physical Exam ?Updated Vital Signs ?BP (!) 140/53   Pulse (!) 105   Temp 99.6 ?F (37.6 ?C) (Oral)   Resp (!) 25   Ht 5\' 4"  (1.626 m)   Wt (!) 217.7 kg   SpO2 91%   BMI 82.39 kg/m?  ?Physical Exam ?Vitals and nursing note reviewed.  ?Constitutional:   ?   General: She is not in acute distress. ?   Appearance: She is not ill-appearing.  ?HENT:  ?   Head: Normocephalic and atraumatic.  ?   Nose: No congestion.  ?Eyes:  ?   Conjunctiva/sclera: Conjunctivae normal.  ?Cardiovascular:  ?   Rate and Rhythm: Normal rate and regular rhythm.  ?   Pulses: Normal pulses.  ?   Heart sounds: No murmur heard. ?  No friction rub. No gallop.  ?Pulmonary:  ?   Effort:  No respiratory distress.  ?   Breath sounds: No wheezing, rhonchi or rales.  ?Abdominal:  ?   Palpations: Abdomen is soft.  ?   Tenderness: There is abdominal tenderness. There is no right CVA tenderness or left CVA tenderness.  ?   Comments: Abdomen nondistended normal bowel sounds dull to percussion, has noted right upper quadrant tenderness, no guarding rebound is, peritoneal sign negative Murphy sign or McBurney point.  ?Musculoskeletal:  ?   Right lower leg: No edema.  ?   Left lower leg: No edema.  ?Skin: ?   General: Skin is warm and dry.  ?Neurological:  ?    Mental Status: She is alert.  ?Psychiatric:     ?   Mood and Affect: Mood normal.  ? ? ?ED Results / Procedures / Treatments   ?Labs ?(all labs ordered are listed, but only abnormal results are displayed) ?Labs Reviewed  ?COMPREHENSIVE METABOLIC PANEL - Abnormal; Notable for the following components:  ?    Result Value  ? Sodium 134 (*)   ? Glucose, Bld 107 (*)   ? Calcium 8.4 (*)   ? Total Protein 8.7 (*)   ? Albumin 2.7 (*)   ? All other components within normal limits  ?CBC WITH DIFFERENTIAL/PLATELET - Abnormal; Notable for the following components:  ? WBC 17.6 (*)   ? Hemoglobin 11.0 (*)   ? MCHC 29.3 (*)   ? RDW 15.8 (*)   ? Platelets 531 (*)   ? Neutro Abs 14.4 (*)   ? Abs Immature Granulocytes 1.14 (*)   ? All other components within normal limits  ?URINALYSIS, ROUTINE W REFLEX MICROSCOPIC - Abnormal; Notable for the following components:  ? Color, Urine AMBER (*)   ? APPearance HAZY (*)   ? Hgb urine dipstick LARGE (*)   ? Protein, ur 30 (*)   ? Leukocytes,Ua SMALL (*)   ? RBC / HPF >50 (*)   ? Bacteria, UA RARE (*)   ? All other components within normal limits  ?PROTIME-INR - Abnormal; Notable for the following components:  ? Prothrombin Time 51.3 (*)   ? INR 5.7 (*)   ? All other components within normal limits  ?CULTURE, BLOOD (ROUTINE X 2)  ?CULTURE, BLOOD (ROUTINE X 2)  ?LIPASE, BLOOD  ?POC URINE PREG, ED  ?I-STAT BETA HCG BLOOD, ED ([facility], [facility], [facility] ONLY)  ? ? ?EKG ?None ? ?Radiology ?DG Abdomen 1 View ? ?Result Date: 04/16/2022 ?CLINICAL DATA:  Right upper quadrant pain, kidney stone, nausea. EXAM: ABDOMEN - 1 VIEW COMPARISON:  None. FINDINGS: A distended loop of small bowel is noted in the mid left abdomen measuring up to 5.4 cm in diameter. There is paucity of bowel gas in the right abdomen. No renal calculus is seen bilaterally. Surgical clips are present in the right upper quadrant. IMPRESSION: 1. No evidence of renal calculus. 2. Dilated loops of small bowel in the abdomen measuring up to 5.4 cm,  concerning for small bowel obstruction. CT is recommended for further evaluation. 3. Paucity of bowel gas in the right abdomen. Electronically Signed   By: Thornell SartoriusLaura  Taylor M.D.   On: 04/16/2022 03:34  ? ?US Abdomen Complete ? ?Result Date: 04/16/2022 ?CLINICAL DATA:  Abdominal pain x6 days EXAM: ABDOMEN ULTRASOUND COMPLETE COMPARISON:  04/19/2019 FINDINGS: Gallbladder: Surgically absent. Common bile duct: Diameter: 6 mm Liver: Within normal limits hyperechoic hepatic parenchyma, raising the possibility of hepatic steatosis. Exophytic cyst with layering hemorrhage inferiorly along the liver, measuring 24.7  x 23.7 x 23.6 cm, previously 19 cm on CT. Portal vein is patent on color Doppler imaging with normal direction of blood flow towards the liver. IVC: No abnormality visualized. Pancreas: Poorly visualized due to overlying bowel gas. Spleen: At the upper limits of normal, measuring 13.4 cm. Right Kidney: Length: 14.9 cm. Echogenicity within normal limits. No mass or hydronephrosis visualized. Left Kidney: Length: 15.1 cm. Echogenicity within normal limits. No mass or hydronephrosis visualized. Abdominal aorta: No aneurysm visualized. Other findings: None. IMPRESSION: 24.7 cm complex/hemorrhagic cyst inferiorly along the liver, previously 19 cm. Additional ancillary findings as above. Electronically Signed   By: Charline Bills M.D.   On: 04/16/2022 03:32  ? ?CT ABDOMEN PELVIS W CONTRAST ? ?Result Date: 04/16/2022 ?CLINICAL DATA:  Abdominal pain with nausea vomiting and diarrhea. EXAM: CT ABDOMEN AND PELVIS WITH CONTRAST TECHNIQUE: Multidetector CT imaging of the abdomen and pelvis was performed using the standard protocol following bolus administration of intravenous contrast. RADIATION DOSE REDUCTION: This exam was performed according to the departmental dose-optimization program which includes automated exposure control, adjustment of the mA and/or kV according to patient size and/or use of iterative reconstruction  technique. CONTRAST:  OMNIPAQUE IOHEXOL 300 MG/ML  SOLN COMPARISON:  CT venogram abdomen/pelvis 04/12/2019 FINDINGS: Lower chest: Chest 11 mm right lower lobe pulmonary nodule seen on the previous study is not

## 2022-04-16 NOTE — Telephone Encounter (Signed)
Copied from CRM [id number]. Topic: General - Other ?>> Apr 16, 2022 10:22 AM Aretta Nip wrote: ?Reason for CRM: LUKE, pt mother called and pt is in hospital and will not make visit on 4/27. Will be in touch [phone] ?

## 2022-04-16 NOTE — Progress Notes (Addendum)
? ? ?Referring Physician(s): ?Fondaw,W, PA-C ? ?Supervising Physician: Richarda OverlieHenn, Adam ? ?Patient Status:[facility] ED ? ?Chief Complaint: abdominal pain, back pain, dyspnea ? ? ? ?Subjective: ?Pt known to IR service from bilat PE thrombolysis in 2020. She is a 46 yo female with PMH sig for HTN, GERD, prior cholecystectomy, DVT/PE on coumadin(LD 4/23), anxiety/depression, migraines, morbid obesity, sleep apnea who [facility] receiving tx for [facility]. She now presents with RUQ/back pain, dyspnea, temp elevation, recent N/V. Latest CT A/P  today reveals: ? ?1. No acute findings in the abdomen or pelvis. Specifically, no ?findings to explain the patient's history of abdominal pain and ?vomiting. ?2. Interval progression of a large cystic mass in the right abdomen ?and pelvis, present since at least 02/05/2017. Lesion measures 24.2 ?x 24.3 x 24.6 cm today compared to 19 x 17 x 18 cm previously. It ?does generate substantial mass-effect on the liver parenchyma, ?bowel, and retroperitoneal anatomy/IVC. Patency of the IVC cannot be ?confirmed on this study. ?3. Similar appearance of mild lymphadenopathy in the groin regions ?bilaterally although mildly progressive on the left. ?4. Small volume free fluid in the abdomen and pelvis. ?5. 11 mm right lower lobe pulmonary nodule seen on the previous ?study is not evident today but may not have been included in the ?field of view. ?6. Hepatic steatosis ? ?Current labs include WBC 17.6, HGB 11, CREAT NL, PT 51.3/INR 5.7; will repeat PT/INR; request received for poss aspiration of large abd cystic mass. ? ? ? ?Past Medical History:  ?Diagnosis Date  ? Allergy   ? Anxiety   ? Depression   ? DVT (deep venous thrombosis) (HCC)   ? GERD (gastroesophageal reflux disease)   ? Hepatic cyst   ? Hypertension   ? Lymphedema of both lower extremities   ? Migraines   ? Morbid obesity with BMI of 70 and over, adult [facility](HCC)   ? PE (pulmonary thromboembolism) (HCC) 03/2019  ?  Sleep apnea   ? ?Past Surgical History:  ?Procedure Laterality Date  ? CHOLECYSTECTOMY    ? IR INFUSION THROMBOL ARTERIAL INITIAL (MS)  03/29/2019  ? IR INFUSION THROMBOL ARTERIAL INITIAL (MS)  03/29/2019  ? IR THROMB F/U EVAL ART/VEN FINAL DAY (MS)  03/30/2019  ? IR US GUIDE VASC ACCESS RIGHT  03/29/2019  ? ? ? ?Allergies: ?Adhesive [tape] ? ?Medications: ?Prior to Admission medications   ?Medication Sig Start Date End Date Taking? Authorizing Provider  ?acetaminophen (TYLENOL) 325 MG tablet Take 2 tablets (650 mg total) by mouth every 6 (six) hours as needed for mild pain (or Fever >/= 101). 04/11/22   Elgergawy, Leana Roeawood S, MD  ?amLODipine (NORVASC) 10 MG tablet TAKE 1 TABLET (10 MG TOTAL) BY MOUTH DAILY. PLEASE MAKE AN APPOINTMENT TO BE SEEN BY YOUR PCP. ?Patient taking differently: Take 10 mg by mouth daily. 03/13/22   Storm FriskWright, Patrick E, MD  ?amoxicillin-clavulanate (AUGMENTIN) 875-125 MG tablet Take 1 tablet by mouth 2 (two) times daily. 04/11/22   Elgergawy, Leana Roeawood S, MD  ?warfarin (COUMADIN) 5 MG tablet Take as directed by the Coumadin Clinic. ?Patient taking differently: Take 2.5-5 mg by mouth See admin instructions. 2.5 mg Monday, Friday ?5 mg Tuesday,Wednesday,Thursday,Saturday and sunday 08/02/21   Storm FriskWright, Patrick E, MD  ? ? ? ?Vital Signs: ?BP 116/71   Pulse (!) 110   Temp 99.6 ?F (37.6 ?C) (Oral)   Resp (!) 23   Ht 5\' 4"  (1.626 m)   Wt (!) 480 lb (217.7 kg)  SpO2 95%   BMI 82.39 kg/m?  ? ?Physical Exam : awake/alert; chest- CTA bilat ant; heart- sl tachy but regular; abd- obese, taut, few BS, tender RUQ with rad to back; ext with FROM ? ?Imaging: ?DG Abdomen 1 View ? ?Result Date: 04/16/2022 ?CLINICAL DATA:  Right upper quadrant pain, kidney stone, nausea. EXAM: ABDOMEN - 1 VIEW COMPARISON:  None. FINDINGS: A distended loop of small bowel is noted in the mid left abdomen measuring up to 5.4 cm in diameter. There is paucity of bowel gas in the right abdomen. No renal calculus is seen bilaterally. Surgical  clips are present in the right upper quadrant. IMPRESSION: 1. No evidence of renal calculus. 2. Dilated loops of small bowel in the abdomen measuring up to 5.4 cm, concerning for small bowel obstruction. CT is recommended for further evaluation. 3. Paucity of bowel gas in the right abdomen. Electronically Signed   By: Thornell Sartorius M.D.   On: 04/16/2022 03:34  ? ?US Abdomen Complete ? ?Result Date: 04/16/2022 ?CLINICAL DATA:  Abdominal pain x6 days EXAM: ABDOMEN ULTRASOUND COMPLETE COMPARISON:  04/19/2019 FINDINGS: Gallbladder: Surgically absent. Common bile duct: Diameter: 6 mm Liver: Within normal limits hyperechoic hepatic parenchyma, raising the possibility of hepatic steatosis. Exophytic cyst with layering hemorrhage inferiorly along the liver, measuring 24.7 x 23.7 x 23.6 cm, previously 19 cm on CT. Portal vein is patent on color Doppler imaging with normal direction of blood flow towards the liver. IVC: No abnormality visualized. Pancreas: Poorly visualized due to overlying bowel gas. Spleen: At the upper limits of normal, measuring 13.4 cm. Right Kidney: Length: 14.9 cm. Echogenicity within normal limits. No mass or hydronephrosis visualized. Left Kidney: Length: 15.1 cm. Echogenicity within normal limits. No mass or hydronephrosis visualized. Abdominal aorta: No aneurysm visualized. Other findings: None. IMPRESSION: 24.7 cm complex/hemorrhagic cyst inferiorly along the liver, previously 19 cm. Additional ancillary findings as above. Electronically Signed   By: Charline Bills M.D.   On: 04/16/2022 03:32  ? ?CT ABDOMEN PELVIS W CONTRAST ? ?Result Date: 04/16/2022 ?CLINICAL DATA:  Abdominal pain with nausea vomiting and diarrhea. EXAM: CT ABDOMEN AND PELVIS WITH CONTRAST TECHNIQUE: Multidetector CT imaging of the abdomen and pelvis was performed using the standard protocol following bolus administration of intravenous contrast. RADIATION DOSE REDUCTION: This exam was performed according to the departmental  dose-optimization program which includes automated exposure control, adjustment of the mA and/or kV according to patient size and/or use of iterative reconstruction technique. CONTRAST:  OMNIPAQUE IOHEXOL 300 MG/ML  SOLN COMPARISON:  CT venogram abdomen/pelvis 04/12/2019 FINDINGS: Lower chest: Chest 11 mm right lower lobe pulmonary nodule seen on the previous study is not evident today but may not have been included in the field of view. Hepatobiliary: The liver shows diffusely decreased attenuation suggesting fat deposition. Gallbladder surgically absent. No intrahepatic or extrahepatic biliary dilation. As on prior studies, there is a large cyst in the right abdomen and pelvis, measuring 24.2 x 24.3 x 24.6 cm today. This is been present since at least a CTA of 02/05/2017. While previously described as hepatic origin, the etiology of this cystic lesion is not definable on today's study. It does generates substantial mass-effect on the liver parenchyma, bowel, and retroperitoneal anatomy/IVC. Pancreas: No focal mass lesion. No dilatation of the main duct. No intraparenchymal cyst. No peripancreatic edema. Spleen: No splenomegaly. No focal mass lesion. Adrenals/Urinary Tract: No adrenal nodule or mass. Kidneys unremarkable. No evidence for hydroureter. The urinary bladder appears normal for the degree of distention.  Stomach/Bowel: Stomach is decompressed. No small bowel wall thickening. No small bowel dilatation. Neither the terminal ileum nor the appendix can be discretely identified. Colon is nondilated. Vascular/Lymphatic: No abdominal aortic aneurysm. Substantial mass-effect on the IVC due to the right abdominal cystic mass. Patency of the IVC cannot be confirmed on this study. Upper normal para-aortic lymph nodes are seen in the abdomen with similar mild pelvic sidewall lymphadenopathy in bilateral groin lymphadenopathy. 1.9 cm short axis right groin node on 88/2 today was 2.0 cm short axis previously. 2.0  cm short axis left groin node on 90/2 was 1.3 cm short axis previously. Vascular collateralization noted anterior right abdominal wall. Reproductive: Central pelvis not well seen due to artifact from body habitus.

## 2022-04-16 NOTE — ED Notes (Signed)
Pt will need to be transferred to [facility] for CT scan. RN contacted charge RN at [facility] to discuss. [facility] contacted to transport pt.  ?

## 2022-04-16 NOTE — ED Notes (Signed)
Pt ambulated to restroom to get sample. She reported feeling lightheaded but was able to give sample and ambulate back to room without distress.  ?

## 2022-04-16 NOTE — H&P (Signed)
?History and Physical  ? ? ?Patient: Susan Warren [id number] DOB: January 21, 1976 ?DOA: 04/15/2022 ?DOS: the patient was seen and examined on 04/16/2022 ?PCP: Elsie Stain, MD  ?Patient coming from: Home ? ?Chief Complaint:  ?Chief Complaint  ?Patient presents with  ? Abdominal Pain  ? Nausea  ? Vomiting  ? Diarrhea  ? ?HPI: Susan Warren is a 46 y.o. female with medical history significant of allergies, anxiety, depression, history of DVT, GERD, hepatic cyst, hypertension, stage IV lymphedema both lower extremities, migraines, morbid obesity, with a BMI of 82.39 kg/m?, history of PE, sleep apnea, admitted and discharged twice in the past 8 days who is coming to the emergency department due to nausea, abdominal pain and diarrhea with decreased appetite since Friday then started having emesis yesterday afternoon.  She has only been able to eat small amounts.  No melena or hematochezia.  No flank pain, dysuria, frequency or hematuria.  No fever, chills, sore throat, chest pain, palpitations, but stated she gets easily fatigued and occasionally dyspneic.  No wheezing or hemoptysis. ? ?ED course: Initial vital signs were temperature Initial vital signs were temperature 98.7 1 ?F, pulse 96, respiration 18, blood pressure 160/94 mmHg O2 sat 100% on room air.  The patient received 1000 mL bolus, Zosyn 3.375 g IVPB, ondansetron 4 mg IVP, hydromorphone 1 mg IVP.  I added hydromorphone 2 mg IVP. ? ?Lab work: Urinalysis with large hemoglobinuria, small leukocyte esterase more than 50 RBC on microscopic examination with only 6-10 WBC and rare bacteria.  CBC with a white count 17.6, hemoglobin 11.0 g/dL and platelets 531.  Lipase was normal.  CMP showed a sodium 134 mmol/L, glucose of 107 mg/dL.  Renal function and the rest of the electrolytes were normal when calcium was corrected.  Total protein was 8.7 and albumin 2.7 g/dL. ? ?Imaging: CT abdomen showed no acute findings but there was interval progression of the large  cystic mass in the right abdomen and pelvis present since at least 02/05/2017.  Similar appearance of mild adenopathies in the groin region.  Hepatic asteatosis.  Right lower lobe nodule was not seen.  Please see images and full radiology report for further details. ? ?Review of Systems: As mentioned in the history of present illness. All other systems reviewed and are negative. ?Past Medical History:  ?Diagnosis Date  ? Allergy   ? Anxiety   ? Depression   ? DVT (deep venous thrombosis) ([place])   ? GERD (gastroesophageal reflux disease)   ? Hepatic cyst   ? Hypertension   ? Lymphedema of both lower extremities   ? Migraines   ? Morbid obesity with BMI of 70 and over, adult [facility])   ? PE (pulmonary thromboembolism) ([place]) 03/2019  ? Sleep apnea   ? ?Past Surgical History:  ?Procedure Laterality Date  ? CHOLECYSTECTOMY    ? IR INFUSION THROMBOL ARTERIAL INITIAL (MS)  03/29/2019  ? IR INFUSION THROMBOL ARTERIAL INITIAL (MS)  03/29/2019  ? IR THROMB F/U EVAL ART/VEN FINAL DAY (MS)  03/30/2019  ? IR US GUIDE VASC ACCESS RIGHT  03/29/2019  ? ?Social History:  reports that she has never smoked. She has never used smokeless tobacco. She reports current alcohol use. She reports that she does not use drugs. ? ?Allergies  ?Allergen Reactions  ? Adhesive [Tape] Anaphylaxis and Swelling  ?  *Adhesive Spray*   ? ? ?Family History  ?Problem Relation Age of Onset  ? Hypertension Mother   ? Thyroid disease Mother   ?  Hypertension Maternal Grandmother   ? Diabetes Maternal Grandmother   ? Breast cancer Maternal Grandmother   ? Stroke Maternal Grandmother   ? Heart attack Neg Hx   ? ? ?Prior to Admission medications   ?Medication Sig Start Date End Date Taking? Authorizing Provider  ?acetaminophen (TYLENOL) 325 MG tablet Take 2 tablets (650 mg total) by mouth every 6 (six) hours as needed for mild pain (or Fever >/= 101). 04/11/22  Yes Elgergawy, Silver Huguenin, MD  ?warfarin (COUMADIN) 5 MG tablet Take as directed by the [facility]. ?Patient  taking differently: Take 2.5-5 mg by mouth See admin instructions. Take 2.5mg  by mouth on Monday and Friday, then take 5mg  on the remaining days. 08/02/21  Yes Elsie Stain, MD  ?amLODipine (NORVASC) 10 MG tablet TAKE 1 TABLET (10 MG TOTAL) BY MOUTH DAILY. PLEASE MAKE AN APPOINTMENT TO BE SEEN BY YOUR PCP. ?Patient taking differently: Take 10 mg by mouth daily at 6 PM. 03/13/22   Elsie Stain, MD  ?amoxicillin-clavulanate (AUGMENTIN) 875-125 MG tablet Take 1 tablet by mouth 2 (two) times daily. ?Patient not taking: Reported on 04/16/2022 04/11/22   Elgergawy, Silver Huguenin, MD  ? ? ?Physical Exam: ?Vitals:  ? 04/16/22 0700 04/16/22 1030 04/16/22 1345 04/16/22 1400  ?BP: (!) 140/53 116/71 123/71 109/69  ?Pulse: (!) 105 (!) 110 (!) 101 (!) 103  ?Resp: (!) 25 (!) 23 (!) 24 (!) 23  ?Temp:    98.3 ?F (36.8 ?C)  ?TempSrc:    Oral  ?SpO2: 91% 95% 98% 97%  ?Weight:      ?Height:      ? ?Physical Exam ?Constitutional:   ?   Appearance: She is obese.  ?HENT:  ?   Head: Normocephalic.  ?   Mouth/Throat:  ?   Mouth: Mucous membranes are dry.  ?Eyes:  ?   Pupils: Pupils are equal, round, and reactive to light.  ?Cardiovascular:  ?   Rate and Rhythm: Regular rhythm. Tachycardia present.  ?Pulmonary:  ?   Effort: Pulmonary effort is normal.  ?   Breath sounds: Normal breath sounds. No wheezing, rhonchi or rales.  ?Abdominal:  ?   General: Abdomen is protuberant.  ?   Palpations: Abdomen is soft.  ?   Tenderness: There is abdominal tenderness in the right upper quadrant. There is right CVA tenderness. There is no left CVA tenderness, guarding or rebound.  ?Musculoskeletal:  ?   Cervical back: Neck supple.  ?   Right lower leg: Edema present.  ?   Left lower leg: Edema present.  ?Lymphadenopathy:  ?   Comments: Bilateral stage IV lymphedema with elephantiasis-like changes.  ?Skin: ?   General: Skin is warm and dry.  ?Neurological:  ?   General: No focal deficit present.  ?   Mental Status: She is alert and oriented to person,  place, and time.  ?Psychiatric:     ?   Mood and Affect: Mood normal.     ?   Behavior: Behavior normal.  ? ?Data Reviewed: ? ?There are no new results to review at this time. ? ?Assessment and Plan: ?Principal Problem: ?  Abdominal pain ?With a cystic pelvic mass in female ?Observation/PCU. ?Analgesics as needed. ?Antiemetics as needed. ?Interventional radiology consult appreciated. ?Continue IV antibiotic coverage with Zosyn. ?N.p.o. after midnight. ?Follow-up CBC, CMP, PT/INR. ? ?Active Problems: ?  Supratherapeutic INR ?Hold warfarin. ?Check PT/INR in the morning. ? ?  History of pulmonary embolism ?As above. ?Holding warfarin. ? ?  Hypertension ?Continue amlodipine 10 mg p.o. daily. ? ?   GERD (gastroesophageal reflux disease) ?Continue pantoprazole. ? ?  Right leg DVT ([place]) ?Continue PPI. ? ?  Lymphedema of both lower extremities ?Supportive care. ? ?  Obesity, morbid, BMI 50 or higher ([place]) ?Lifestyle modifications. ?Follow-up with primary care provider. ? ? ?  ? ? Advance Care Planning:   Code Status: Full Code  ? ?Consults: IR  ? ?Family Communication: ? ?Severity of Illness: ?The appropriate patient status for this patient is OBSERVATION. Observation status is judged to be reasonable and necessary in order to provide the required intensity of service to ensure the patient's safety. The patient's presenting symptoms, physical exam findings, and initial radiographic and laboratory data in the context of their medical condition is felt to place them at decreased risk for further clinical deterioration. Furthermore, it is anticipated that the patient will be medically stable for discharge from the hospital within 2 midnights of admission.  ? ?Author: ?Reubin Milan, MD ?04/16/2022 3:23 PM ? ?For on call review [email or web address].  ? ?This document was prepared in [employer] recognition software and may contain some unintended transcription errors. ?

## 2022-04-16 NOTE — Progress Notes (Signed)
?   04/16/22 1539  ?Assess: MEWS Score  ?Temp 98.6 ?F (37 ?C)  ?BP (!) 174/82  ?Pulse Rate (!) 103  ?Resp (!) 24  ?SpO2 95 %  ?O2 Device Room Air  ?Assess: MEWS Score  ?MEWS Temp 0  ?MEWS Systolic 0  ?MEWS Pulse 1  ?MEWS RR 1  ?MEWS LOC 0  ?MEWS Score 2  ?MEWS Score Color Yellow  ?Assess: if the MEWS score is Yellow or Red  ?Were vital signs taken at a resting state? Yes  ?Focused Assessment No change from prior assessment  ?Does the patient meet 2 or more of the SIRS criteria? Yes  ?Does the patient have a confirmed or suspected source of infection? Yes  ?Provider and Rapid Response Notified? No  ?Early Detection of Sepsis Score *See Row Information* Medium  ?MEWS guidelines implemented *See Row Information* Yes  ?Take Vital Signs  ?Increase Vital Sign Frequency  Yellow: Q 2hr X 2 then Q 4hr X 2, if remains yellow, continue Q 4hrs  ?Escalate  ?MEWS: Escalate Yellow: discuss with charge nurse/RN and consider discussing with provider and RRT  ?Notify: Charge Nurse/RN  ?Name of Charge Nurse/RN Notified Shearon Stalls, RN  ?Date Charge Nurse/RN Notified 04/16/22  ?Time Charge Nurse/RN Notified 1600  ?Assess: SIRS CRITERIA  ?SIRS Temperature  0  ?SIRS Pulse 1  ?SIRS Respirations  1  ?SIRS WBC 0  ?SIRS Score Sum  2  ? ? ?

## 2022-04-16 NOTE — ED Notes (Signed)
Per Chrissie Noa PA order- Transportation request sent to [facility]) for CT diagnostic@ [facility] ED. PA, Nurse advised. ENMiles ?

## 2022-04-16 NOTE — ED Notes (Signed)
Pt c/o SOB at this time, saturations between 90-93% RA, 1 liter oxygen administered via [place] at this time.  ?

## 2022-04-16 NOTE — ED Notes (Signed)
Pt ambulated to bathroom x1 assist.  

## 2022-04-17 DIAGNOSIS — K219 Gastro-esophageal reflux disease without esophagitis: Secondary | ICD-10-CM

## 2022-04-17 DIAGNOSIS — Z823 Family history of stroke: Secondary | ICD-10-CM | POA: Diagnosis not present

## 2022-04-17 DIAGNOSIS — Z79899 Other long term (current) drug therapy: Secondary | ICD-10-CM | POA: Diagnosis not present

## 2022-04-17 DIAGNOSIS — Z7901 Long term (current) use of anticoagulants: Secondary | ICD-10-CM | POA: Diagnosis not present

## 2022-04-17 DIAGNOSIS — I82511 Chronic embolism and thrombosis of right femoral vein: Secondary | ICD-10-CM

## 2022-04-17 DIAGNOSIS — K651 Peritoneal abscess: Secondary | ICD-10-CM | POA: Diagnosis present

## 2022-04-17 DIAGNOSIS — E538 Deficiency of other specified B group vitamins: Secondary | ICD-10-CM | POA: Diagnosis present

## 2022-04-17 DIAGNOSIS — Z9989 Dependence on other enabling machines and devices: Secondary | ICD-10-CM | POA: Diagnosis not present

## 2022-04-17 DIAGNOSIS — D638 Anemia in other chronic diseases classified elsewhere: Secondary | ICD-10-CM | POA: Diagnosis present

## 2022-04-17 DIAGNOSIS — E871 Hypo-osmolality and hyponatremia: Secondary | ICD-10-CM | POA: Diagnosis present

## 2022-04-17 DIAGNOSIS — L02211 Cutaneous abscess of abdominal wall: Secondary | ICD-10-CM | POA: Diagnosis present

## 2022-04-17 DIAGNOSIS — R1084 Generalized abdominal pain: Secondary | ICD-10-CM | POA: Diagnosis not present

## 2022-04-17 DIAGNOSIS — Z6841 Body Mass Index (BMI) 40.0 and over, adult: Secondary | ICD-10-CM

## 2022-04-17 DIAGNOSIS — Z833 Family history of diabetes mellitus: Secondary | ICD-10-CM | POA: Diagnosis not present

## 2022-04-17 DIAGNOSIS — K7460 Unspecified cirrhosis of liver: Secondary | ICD-10-CM | POA: Diagnosis present

## 2022-04-17 DIAGNOSIS — Z8249 Family history of ischemic heart disease and other diseases of the circulatory system: Secondary | ICD-10-CM | POA: Diagnosis not present

## 2022-04-17 DIAGNOSIS — Z20822 Contact with and (suspected) exposure to covid-19: Secondary | ICD-10-CM | POA: Diagnosis present

## 2022-04-17 DIAGNOSIS — R1011 Right upper quadrant pain: Secondary | ICD-10-CM | POA: Diagnosis present

## 2022-04-17 DIAGNOSIS — R791 Abnormal coagulation profile: Secondary | ICD-10-CM

## 2022-04-17 DIAGNOSIS — K760 Fatty (change of) liver, not elsewhere classified: Secondary | ICD-10-CM | POA: Diagnosis present

## 2022-04-17 DIAGNOSIS — G4733 Obstructive sleep apnea (adult) (pediatric): Secondary | ICD-10-CM | POA: Diagnosis not present

## 2022-04-17 DIAGNOSIS — Z86711 Personal history of pulmonary embolism: Secondary | ICD-10-CM

## 2022-04-17 DIAGNOSIS — I890 Lymphedema, not elsewhere classified: Secondary | ICD-10-CM | POA: Diagnosis present

## 2022-04-17 DIAGNOSIS — Z86718 Personal history of other venous thrombosis and embolism: Secondary | ICD-10-CM | POA: Diagnosis not present

## 2022-04-17 DIAGNOSIS — R1900 Intra-abdominal and pelvic swelling, mass and lump, unspecified site: Secondary | ICD-10-CM | POA: Diagnosis not present

## 2022-04-17 DIAGNOSIS — Z9049 Acquired absence of other specified parts of digestive tract: Secondary | ICD-10-CM | POA: Diagnosis not present

## 2022-04-17 DIAGNOSIS — Z803 Family history of malignant neoplasm of breast: Secondary | ICD-10-CM | POA: Diagnosis not present

## 2022-04-17 DIAGNOSIS — I10 Essential (primary) hypertension: Secondary | ICD-10-CM | POA: Diagnosis present

## 2022-04-17 DIAGNOSIS — R7881 Bacteremia: Secondary | ICD-10-CM | POA: Diagnosis not present

## 2022-04-17 DIAGNOSIS — B377 Candidal sepsis: Secondary | ICD-10-CM | POA: Diagnosis present

## 2022-04-17 LAB — COMPREHENSIVE METABOLIC PANEL
ALT: 12 U/L (ref 0–44)
AST: 26 U/L (ref 15–41)
Albumin: 2.1 g/dL — ABNORMAL LOW (ref 3.5–5.0)
Alkaline Phosphatase: 90 U/L (ref 38–126)
Anion gap: 10 (ref 5–15)
BUN: 12 mg/dL (ref 6–20)
CO2: 26 mmol/L (ref 22–32)
Calcium: 8.2 mg/dL — ABNORMAL LOW (ref 8.9–10.3)
Chloride: 101 mmol/L (ref 98–111)
Creatinine, Ser: 0.79 mg/dL (ref 0.44–1.00)
GFR, Estimated: >60 mL/min (ref >60)
Glucose, Bld: 103 mg/dL — ABNORMAL HIGH (ref 70–99)
Potassium: 4.1 mmol/L (ref 3.5–5.1)
Sodium: 137 mmol/L (ref 135–145)
Total Bilirubin: 0.8 mg/dL (ref 0.3–1.2)
Total Protein: 7.5 g/dL (ref 6.5–8.1)

## 2022-04-17 LAB — CBC
HCT: 31.3 % — ABNORMAL LOW (ref 36.0–46.0)
Hemoglobin: 9.7 g/dL — ABNORMAL LOW (ref 12.0–15.0)
MCH: 27.3 pg (ref 26.0–34.0)
MCHC: 31 g/dL (ref 30.0–36.0)
MCV: 88.2 fL (ref 80.0–100.0)
Platelets: 514 10*3/uL — ABNORMAL HIGH (ref 150–400)
RBC: 3.55 MIL/uL — ABNORMAL LOW (ref 3.87–5.11)
RDW: 16 % — ABNORMAL HIGH (ref 11.5–15.5)
WBC: 15.2 10*3/uL — ABNORMAL HIGH (ref 4.0–10.5)
nRBC: 0.1 % (ref 0.0–0.2)

## 2022-04-17 LAB — ABO/RH: ABO/RH(D): B POS

## 2022-04-17 LAB — TYPE AND SCREEN
ABO/RH(D): B POS
Antibody Screen: NEGATIVE

## 2022-04-17 LAB — PROTIME-INR
INR: 5.9 (ref 0.8–1.2)
INR: 4.8 (ref 0.8–1.2)
Prothrombin Time: 52.2 seconds — ABNORMAL HIGH (ref 11.4–15.2)
Prothrombin Time: 44.8 seconds — ABNORMAL HIGH (ref 11.4–15.2)

## 2022-04-17 MED ORDER — SIMETHICONE 80 MG PO CHEW
80.0000 mg | CHEWABLE_TABLET | Freq: Four times a day (QID) | ORAL | Status: DC | PRN
  Administered 2022-04-17 – 2022-04-28 (×11): 80 mg via ORAL
  Filled 2022-04-17 (×11): qty 1

## 2022-04-17 MED ORDER — SODIUM CHLORIDE 0.9% IV SOLUTION: Freq: Once | INTRAVENOUS | Status: AC

## 2022-04-17 MED ORDER — LIP MEDEX EX OINT
TOPICAL_OINTMENT | CUTANEOUS | Status: DC | PRN
  Administered 2022-04-17: 75 via TOPICAL
  Filled 2022-04-17: qty 7

## 2022-04-17 MED ORDER — PROSOURCE PLUS PO LIQD
30.0000 mL | Freq: Two times a day (BID) | ORAL | Status: DC
  Administered 2022-04-17 – 2022-04-18 (×3): 30 mL via ORAL
  Filled 2022-04-17 (×6): qty 30

## 2022-04-17 NOTE — Plan of Care (Signed)
  Problem: Elimination: Goal: Will not experience complications related to bowel motility Outcome: Progressing Goal: Will not experience complications related to urinary retention Outcome: Progressing   Problem: Pain Managment: Goal: General experience of comfort will improve Outcome: Progressing   Problem: Safety: Goal: Ability to remain free from injury will improve Outcome: Progressing   

## 2022-04-17 NOTE — Progress Notes (Signed)
Initial Nutrition Assessment ? ?DOCUMENTATION CODES:  ? ?Morbid obesity ? ?INTERVENTION:  ?- will order 30 ml Prosource Plus BID, each supplement provides 100 kcal and 15 grams protein.  ? ? ?NUTRITION DIAGNOSIS:  ? ?Inadequate oral intake related to acute illness, nausea, decreased appetite as evidenced by per patient/family report. ? ?GOAL:  ? ?Patient will meet greater than or equal to 90% of their needs ? ?MONITOR:  ? ?PO intake, Supplement acceptance, Labs, Weight trends, I & O's ? ?REASON FOR ASSESSMENT:  ? ?Malnutrition Screening Tool ? ?ASSESSMENT:  ? ?46 year old female with medical history of DVT/PE on warfarin, morbid obesity, lymphedema, anxiety, and depression. She presented to the ED due to N/V and abdominal pain. In the ED, CT abdomen/pelvis showed interval progression of large cystic mass in the R abdomen and pelvis and hepatic cirrhosis. IR consult and planning for cyst drainage once INR allows. ? ?Patient laying in bed. She reports that she was admitted to [facility] for PNA and discharged on 4/20. Patient reports that after returning home she had a decreased appetite and was experiencing nausea. Nausea has resolved since admission. She reports that she was NPO until around 1000 today in anticipation for cyst drainage, but this has been postponed. She ate 100% at a late breakfast and has not yet ordered lunch but plans to look at the menu soon. ? ?At home, she does the grocery shopping and her son does the cooking. Patient reports that she is a picky eater at baseline and that since having PNA she has been unable to tolerate meat and dark sodas. She denies any chewing or swallowing difficulties.  ? ?She is unsure if she has had any weight loss recently. Weight on 4/24 was documented as 480 lb which appears to possibly be a stated weight. Weight on 4/16 was 484 lb. This indicates 4 lb weight loss (1% body weight) in the past 8 days. ? ? ?Labs reviewed; Ca: 8.2 mg/dl. ?Medications reviewed; 40 mg IV  protonix/day. ?  ? ?NUTRITION - FOCUSED PHYSICAL EXAM: ? ?Flowsheet Row Most Recent Value  ?Orbital Region No depletion  ?Upper Arm Region No depletion  ?Thoracic and Lumbar Region Unable to assess  ?Buccal Region No depletion  ?Temple Region No depletion  ?Clavicle Bone Region No depletion  ?Clavicle and Acromion Bone Region No depletion  ?Scapular Bone Region No depletion  ?Dorsal Hand No depletion  ?Patellar Region No depletion  ?Anterior Thigh Region No depletion  ?Posterior Calf Region No depletion  ?Edema (RD Assessment) --  [lymphedema to BLE (chronic)]  ?Hair Reviewed  ?Eyes Reviewed  ?Mouth Reviewed  ?Skin Reviewed  ?Nails Reviewed  ? ?  ? ? ?Diet Order:   ?Diet Order   ? ?       ?  Diet NPO time specified  Diet effective midnight       ?  ?  Diet regular Room service appropriate? Yes; Fluid consistency: Thin  Diet effective now       ?  ? ?  ?  ? ?  ? ? ?EDUCATION NEEDS:  ? ?Education needs have been addressed ? ?Skin:  Skin Assessment: Reviewed RN Assessment ? ?Last BM:  PTA/unknown ? ?Height:  ? ?Ht Readings from Last 1 Encounters:  ?04/15/22 5\' 4"  (1.626 m)  ? ? ?Weight:  ? ?Wt Readings from Last 1 Encounters:  ?04/15/22 (!) 217.7 kg  ? ? ? ?BMI:  Body mass index is 82.39 kg/m?. ? ?Estimated Nutritional Needs:  ?Kcal:  2200-2500 kcal ?Protein:  115-130 grams ?Fluid:  >/= 1.8 L/day ? ? ? ? ?Trenton Gammon, MS, RD, LDN ?Registered Dietitian II ?Inpatient Clinical Nutrition ?RD pager # and on-call/weekend pager # available in AMION  ? ?

## 2022-04-17 NOTE — Progress Notes (Signed)
?PROGRESS NOTE ? ?Susan Warren TTS:[id number] DOB: 09/27/1976  ? ?PCP: Storm Frisk, MD ? ?Patient is from: Home.  Lives with son.  Independently ambulates at baseline ? ?DOA: 04/15/2022 LOS: 0 ? ?Chief complaints ?Chief Complaint  ?Patient presents with  ? Abdominal Pain  ? Nausea  ? Vomiting  ? Diarrhea  ?  ? ?Brief Narrative / Interim history: ?46 year old F with PMH of DVT/PE on warfarin, morbid obesity, lymphedema, anxiety and depression presenting with nausea, vomiting and abdominal pain, and admitted for abdominal pain.  CT abdomen and pelvis showed interval progression of the large cystic mass in the right abdomen and pelvis measuring about 24 x 24 x 24 cm and hepatic cirrhosis but no acute finding.  She had leukocytosis to 17.6.  INR was supratherapeutic.  She was started on IV Zosyn.  IR consulted and planning drainage once INR allows.  ? ?Subjective: ?Seen and examined earlier this morning.  No major events overnight of this morning.  Feels hungry.  No other complaints.  She denies further nausea or vomiting.  Abdominal pain improved but she feels tender when she she reports on her belly.  Denies UTI symptoms or vaginal discharge ? ?Objective: ?Vitals:  ? 04/16/22 2138 04/17/22 0020 04/17/22 0428 04/17/22 1233  ?BP:  133/72 125/68 129/66  ?Pulse:  (!) 105 (!) 106 (!) 110  ?Resp:  15 16 20   ?Temp: 99.5 ?F (37.5 ?C) 100 ?F (37.8 ?C) 99.1 ?F (37.3 ?C) 99.8 ?F (37.7 ?C)  ?TempSrc: Oral Oral Oral Oral  ?SpO2:  95% 94% 92%  ?Weight:      ?Height:      ? ? ?Examination: ? ?GENERAL: No apparent distress.  Nontoxic. ?HEENT: MMM.  Vision and hearing grossly intact.  ?NECK: Supple.  No apparent JVD.  ?RESP:  No IWOB.  Fair aeration bilaterally. ?CVS:  RRR. Heart sounds normal.  ?ABD/GI/GU: BS+.  Tenderness over RLQ.  No rebound or guarding. ?MSK/EXT:  Moves extremities.  Significant lymphedema in BLE ?SKIN: no apparent skin lesion or wound ?NEURO: Awake, alert and oriented appropriately.  No apparent focal  neuro deficit. ?PSYCH: Calm. Normal affect.  ? ?Procedures:  ?None ? ?Microbiology summarized: ?Blood cultures NGTD. ? ?Assessment and Plan: ?* Abdominal pain ?Likely due to progressive intra-abdominal and pelvic large cystic mass as noted on CT abdomen and pelvis.  She has significant leukocytosis as well.  Nausea and vomiting seems to have resolved. ?-Appreciate input by IR-plan for drainage once INR is within acceptable range. ?-FFP to reverse INR today ?-Continue IV Zosyn for now ? ?Supratherapeutic INR ?INR 5.9 ?-FFP x1 ?-Continue holding warfarin ? ? ?Pelvic mass in female ?CT abdomen and pelvis showed a large cystic mass in the right abdomen and pelvis measuring 24 x 24 x 24 cm. ?-See abdominal pain ? ?Morbid obesity with body mass index of 70 and over in adult [facility]) ?Body mass index is 82.39 kg/m?[facility] Difficult situation. ?-Encourage lifestyle change to lose weight ? ?Right leg DVT ([facility]) ?Reversing INR for procedure ? ?History of pulmonary embolism ?On warfarin at home.  INR supratherapeutic. ?-Reversing INR for procedure ? ?GERD (gastroesophageal reflux disease) ?Continue PPI ? ?Hypertension ?Normotensive. ?-Continue home meds ? ? ?DVT prophylaxis:  ?Supratherapeutic INR ? ?Code Status: Full code ?Family Communication: Patient and/or RN. Available if any question.  ?Level of care: Progressive ?Status is: Observation ?The patient will require care spanning > 2 midnights and should be moved to inpatient because: Abdominal pain requiring IV antibiotics and further evaluation by  IR and supratherapeutic INR  ? ? ?Final disposition: Interventional radiology ? ?Consultants:  ?TBD ? ?Sch Meds:  ?Scheduled Meds: ? amLODipine  10 mg Oral q1800  ? pantoprazole (PROTONIX) IV  40 mg Intravenous Q24H  ? ?Continuous Infusions: ? ondansetron (ZOFRAN) IV    ? piperacillin-tazobactam (ZOSYN)  IV 3.375 g (04/17/22 0600)  ? ?PRN Meds:.acetaminophen **OR** acetaminophen, HYDROmorphone (DILAUDID) injection, ondansetron (ZOFRAN)  IV ? ?Antimicrobials: ?Anti-infectives (From admission, onward)  ? ? Start     Dose/Rate Route Frequency Ordered Stop  ? 04/16/22 1200  piperacillin-tazobactam (ZOSYN) IVPB 3.375 g  Status:  Discontinued       ?Note to Pharmacy: High volume of distribution. Please adjust dosage as needed.TY.  ? 3.375 g ?100 mL/hr over 30 Minutes Intravenous Every 6 hours 04/16/22 0859 04/16/22 0902  ? 04/16/22 0915  piperacillin-tazobactam (ZOSYN) IVPB 3.375 g       ? 3.375 g ?12.5 mL/hr over 240 Minutes Intravenous Every 8 hours 04/16/22 0902    ? 04/16/22 0430  piperacillin-tazobactam (ZOSYN) IVPB 3.375 g       ? 3.375 g ?100 mL/hr over 30 Minutes Intravenous  Once 04/16/22 0427 04/16/22 0530  ? ?  ? ? ? ?I have personally reviewed the following labs and images: ?CBC: ?Recent Labs  ?Lab 04/11/22 ?0113 04/15/22 ?1950 04/17/22 ?0501  ?WBC 9.6 17.6* 15.2*  ?NEUTROABS 7.2 14.4*  --   ?HGB 9.5* 11.0* 9.7*  ?HCT 31.0* 37.6 31.3*  ?MCV 87.3 88.9 88.2  ?PLT 344 531* 514*  ? ?BMP &GFR ?Recent Labs  ?Lab 04/11/22 ?0113 04/15/22 ?1950 04/17/22 ?0501  ?NA 133* 134* 137  ?K 3.8 3.7 4.1  ?CL 101 98 101  ?CO2 23 25 26   ?GLUCOSE 135* 107* 103*  ?BUN 6 9 12   ?CREATININE 0.71 0.64 0.79  ?CALCIUM 8.2* 8.4* 8.2*  ?MG 1.7  --   --   ? ?Estimated Creatinine Clearance: 162.8 mL/min (by C-G formula based on SCr of 0.79 mg/dL). ?Liver & Pancreas: ?Recent Labs  ?Lab 04/11/22 ?0113 04/15/22 ?1950 04/17/22 ?0501  ?AST 12* 27 26  ?ALT 10 14 12   ?ALKPHOS 75 96 90  ?BILITOT 0.6 0.8 0.8  ?PROT 7.3 8.7* 7.5  ?ALBUMIN 2.1* 2.7* 2.1*  ? ?Recent Labs  ?Lab 04/15/22 ?1950  ?LIPASE 36  ? ?No results for input(s): AMMONIA in the last 168 hours. ?Diabetic: ?No results for input(s): HGBA1C in the last 72 hours. ?Recent Labs  ?Lab 04/10/22 ?1549 04/10/22 ?2041 04/11/22 ?[id number]  ?GLUCAP 115* 136* 120*  ? ?Cardiac Enzymes: ?No results for input(s): CKTOTAL, CKMB, CKMBINDEX, TROPONINI in the last 168 hours. ?No results for input(s): PROBNP in the last 8760 hours. ?Coagulation  Profile: ?Recent Labs  ?Lab 04/11/22 ?0113 04/16/22 ?[id number] 04/16/22 ?1203 04/17/22 ?0501  ?INR 2.8* 5.7* 5.7* 5.9*  ? ?Thyroid Function Tests: ?No results for input(s): TSH, T4TOTAL, FREET4, T3FREE, THYROIDAB in the last 72 hours. ?Lipid Profile: ?No results for input(s): CHOL, HDL, LDLCALC, TRIG, CHOLHDL, LDLDIRECT in the last 72 hours. ?Anemia Panel: ?No results for input(s): VITAMINB12, FOLATE, FERRITIN, TIBC, IRON, RETICCTPCT in the last 72 hours. ?Urine analysis: ?   ?Component Value Date/Time  ? COLORURINE AMBER (A) 04/16/2022 0003  ? APPEARANCEUR HAZY (A) 04/16/2022 0003  ? LABSPEC 1.015 04/16/2022 0003  ? PHURINE 5.0 04/16/2022 0003  ? GLUCOSEU NEGATIVE 04/16/2022 0003  ? GLUCOSEU NEGATIVE 01/25/2014 1049  ? HGBUR LARGE (A) 04/16/2022 0003  ? BILIRUBINUR NEGATIVE 04/16/2022 0003  ? KETONESUR NEGATIVE 04/16/2022 0003  ?  PROTEINUR 30 (A) 04/16/2022 0003  ? UROBILINOGEN 0.2 01/25/2014 1049  ? NITRITE NEGATIVE 04/16/2022 0003  ? LEUKOCYTESUR SMALL (A) 04/16/2022 0003  ? ?Sepsis Labs: ?Invalid input(s): PROCALCITONIN, LACTICIDVEN ? ?Microbiology: ?Recent Results (from the past 240 hour(s))  ?Resp Panel by RT-PCR (Flu A&B, Covid) Nasopharyngeal Swab     Status: None  ? Collection Time: 04/08/22 12:29 AM  ? Specimen: Nasopharyngeal Swab; Nasopharyngeal(NP) swabs in vial transport medium  ?Result Value Ref Range Status  ? SARS Coronavirus 2 by RT PCR NEGATIVE NEGATIVE Final  ?  Comment: (NOTE) ?SARS-CoV-2 target nucleic acids are NOT DETECTED. ? ?The SARS-CoV-2 RNA is generally detectable in upper respiratory ?specimens during the acute phase of infection. The lowest ?concentration of SARS-CoV-2 viral copies this assay can detect is ?138 copies/mL. A negative result does not preclude SARS-Cov-2 ?infection and should not be used as the sole basis for treatment or ?other patient management decisions. A negative result may occur with  ?improper specimen collection/handling, submission of specimen other ?than  nasopharyngeal swab, presence of viral mutation(s) within the ?areas targeted by this assay, and inadequate number of viral ?copies(<138 copies/mL). A negative result must be combined with ?clinical observations, patie

## 2022-04-17 NOTE — Progress Notes (Signed)
?  Transition of Care (TOC) Screening Note ? ? ?Patient Details  ?Name: Susan Warren ?Date of Birth: 1976/03/03 ? ? ?Transition of Care [facility]) CM/SW Contact:    ?Dessa Phi, RN ?Phone Number: ?04/17/2022, 10:48 AM ? ? ? ?Transition of Care Department [facility]) has reviewed patient and no TOC needs have been identified at this time. We will continue to monitor patient advancement through interdisciplinary progression rounds. If new patient transition needs arise, please place a TOC consult. ?  ?

## 2022-04-17 NOTE — Assessment & Plan Note (Signed)
Normotensive. ?-Continue home meds ?

## 2022-04-17 NOTE — Assessment & Plan Note (Signed)
Continue PPI ?

## 2022-04-17 NOTE — Assessment & Plan Note (Deleted)
CT a/p showed a large cystic mass in the right abdomen and pelvis measuring 24 x 24 x 24 cm.  Unclear source of this cyst.  Previously seen by general surgery but seems to be lost to follow-up.  CA125 elevated to 65.5.  Per gynecology, difficult to interpret this mild elevation in the setting of possible acute infection. ?-See abdominal pain above for further management ?

## 2022-04-17 NOTE — Assessment & Plan Note (Signed)
Body mass index is 82.39 kg/m?Marland Kitchen Difficult situation. ?-Encourage lifestyle change to lose weight ?

## 2022-04-17 NOTE — Progress Notes (Signed)
OT Cancellation Note ? ?Patient Details ?Name: ZELMA SNEAD ?MRN: [id number] ?DOB: December 06, 1976 ? ? ?Cancelled Treatment:    Reason Eval/Treat Not Completed: OT screened, no needs identified, will sign off. Patient reports no needs and being at her baseline. Reports limited only by lines/leads. Reports getting up yesterday and performing toileting at her baseline. ? ?Kelli Churn ?04/17/2022, 2:09 PM ?

## 2022-04-17 NOTE — Assessment & Plan Note (Addendum)
INR 5.9>> 1.3 after FFP x2 and IV vitamin K 5 mg. ?-Resume warfarin with heparin bridge. ? ?

## 2022-04-17 NOTE — Progress Notes (Signed)
PT Cancellation Note ? ?Patient Details ?Name: Susan Warren ?MRN: [id number] ?DOB: [birth year] ? ? ?Cancelled Treatment:    Reason Eval/Treat Not Completed: PT screened, no needs identified, will sign off (pt reports she's mobilizing independently without difficulty. No PT indicated, will sign off.) ? ? ?Ralene Bathe Kistler PT 04/17/2022  ?Acute Rehabilitation Services ?Pager [phone] ?Office [phone] ? ?

## 2022-04-17 NOTE — Assessment & Plan Note (Addendum)
On warfarin at home.  ?-Holding home warfarin and bridging with subcu Lovenox ?

## 2022-04-17 NOTE — Consult Note (Signed)
WOC Nurse Consult Note: ?Reason for Consult: erythema intertrigo resulting from ultrasound gel being trapped in skin fold for several days. ? ?ICD-10 CM Codes for Irritant Dermatitis ?L30.4  - Erythema intertrigo. Also used for abrasion of the hand, chafing of the skin, dermatitis due to sweating and friction, friction dermatitis, friction eczema, and genital/thigh intertrigo.  ? ?Wound type: irritant contact dermatitis, erythema intertrigo ?Pressure Injury POA: N/A ?Measurement:3cm x 12cm x 0.1cm ?Wound [id number], moist ?Drainage (amount, consistency, odor) scant serous ?Periwound:erythematous ?Dressing procedure/placement/frequency: I will provide patient with antimicrobial moisture wicking textile (InterDry) and have instructed her on its use. ?Patient with no other expressed skin care needs at this time. ? ?[place] nursing team will not follow, but will remain available to this patient, the nursing and medical teams.  Please re-consult if needed. ?Thanks, ?Maudie Flakes, MSN, RN, [place], [place], CWON-AP, [place]  ?Pager# [phone]  ? ? ? ?  ?

## 2022-04-17 NOTE — Hospital Course (Addendum)
46 year old F with PMH of DVT/PE on warfarin, morbid obesity, lymphedema, anxiety and depression presenting with nausea, vomiting and abdominal pain, and admitted for abdominal pain.  CT abdomen and pelvis showed interval progression of the large cystic mass in the right abdomen and pelvis measuring about 24 x 24 x 24 cm and hepatic cirrhosis but no acute finding.  She had leukocytosis to 17.6.  INR was supratherapeutic.  She was started on IV Zosyn.  IR consulted and planning drainage once INR allows. ?Blood culture with Candida parapsilosis in 1 out of 2 bottles.  ID consulted and started micafungin in addition to IV Zosyn.  TTE without significant finding of vegetation.  ID recommending TEE.  Repeat blood culture on 4/28 NGTD. ?INR reversed with FFP and vitamin K.  She had US guided aspiration of large abdominal cystic mass that yielded 7.2 L brown debris-filled cystic fluid on 5/1.TEE on 5/2 without vegetation. ?Fluid gram stain negative culture no growth so far cytology/pathology- no malignant cells but numerous acute inflammatory cells and necrotic debris compatible with abscess. Marland KitchenRestarted warfarin with Lovenox bridge and awaiting INR to be therapeutic-at this time patient feels comfortable administering Lovenox which she has been doing since Friday, PCP has agreed to see her this week to monitor INR, advised to monitor at least every 48 hours/s, the Lovenox-patient was explained in detail the risk of being on Lovenox if INR is above therapeutic/supratherapeutic-so INR needs to be followed very closely discussed with pharmacy we will dose her with Coumadin 5 mg daily moving forward and PCP will adjust her dose further ?I talked with ophthalmology Dr. Dione Booze who will arrange for follow-up for ophthalmology evaluation  04/30/22- he was provided with patient's details and phone number. ?

## 2022-04-17 NOTE — Assessment & Plan Note (Addendum)
See PE

## 2022-04-17 NOTE — Assessment & Plan Note (Addendum)
Likely due to progressive intra-abdominal and pelvic large cystic mass as noted on CT abdomen and pelvis.  Unclear origin of this mass.  BCID and 1 out of 2 blood culture with Candidia Parapsilosis.  Repeat blood culture NGTD.  CA125 slightly elevated to 65.5 but difficult to interpret in the setting of possible acute infection per gyn/onc.  Nausea and vomiting seems to have resolved.  Leukocytosis and abdominal pain improved after drain. ?-S/p US guided drainage with removal of 7.2 L of  brown debris-filled cystic fluid on 5/1.  ?-Follow fluid culture and cytology. ?-Continue IV Zosyn and micafungin per ID ?-Tylenol, oxycodone and IV Dilaudid as needed for pain based on severity ?-Schedule Senokot-S daily for bowel regimen ?-General surgery recommends outpatient follow-up. ?

## 2022-04-17 NOTE — Telephone Encounter (Signed)
Talked with pt's mother this morning. Will reschedule with me and with her PCP for a hospital f/u once her disposition is known.  ?

## 2022-04-18 ENCOUNTER — Ambulatory Visit: Payer: 59 | Admitting: Pharmacist

## 2022-04-18 DIAGNOSIS — B377 Candidal sepsis: Principal | ICD-10-CM

## 2022-04-18 DIAGNOSIS — Z86711 Personal history of pulmonary embolism: Secondary | ICD-10-CM | POA: Diagnosis not present

## 2022-04-18 DIAGNOSIS — E871 Hypo-osmolality and hyponatremia: Secondary | ICD-10-CM

## 2022-04-18 DIAGNOSIS — K219 Gastro-esophageal reflux disease without esophagitis: Secondary | ICD-10-CM | POA: Diagnosis not present

## 2022-04-18 DIAGNOSIS — I10 Essential (primary) hypertension: Secondary | ICD-10-CM | POA: Diagnosis not present

## 2022-04-18 DIAGNOSIS — R1084 Generalized abdominal pain: Secondary | ICD-10-CM | POA: Diagnosis not present

## 2022-04-18 DIAGNOSIS — D649 Anemia, unspecified: Secondary | ICD-10-CM

## 2022-04-18 DIAGNOSIS — D72829 Elevated white blood cell count, unspecified: Secondary | ICD-10-CM

## 2022-04-18 HISTORY — DX: Candidal sepsis: B37.7

## 2022-04-18 LAB — BLOOD CULTURE ID PANEL (REFLEXED) - BCID2
A.calcoaceticus-baumannii: NOT DETECTED
Bacteroides fragilis: NOT DETECTED
Candida albicans: NOT DETECTED
Candida auris: NOT DETECTED
Candida glabrata: NOT DETECTED
Candida krusei: NOT DETECTED
Candida parapsilosis: DETECTED — AB
Candida tropicalis: NOT DETECTED
Cryptococcus neoformans/gattii: NOT DETECTED
Enterobacter cloacae complex: NOT DETECTED
Enterobacterales: NOT DETECTED
Enterococcus Faecium: NOT DETECTED
Enterococcus faecalis: NOT DETECTED
Escherichia coli: NOT DETECTED
Haemophilus influenzae: NOT DETECTED
Klebsiella aerogenes: NOT DETECTED
Klebsiella oxytoca: NOT DETECTED
Klebsiella pneumoniae: NOT DETECTED
Listeria monocytogenes: NOT DETECTED
Neisseria meningitidis: NOT DETECTED
Proteus species: NOT DETECTED
Pseudomonas aeruginosa: NOT DETECTED
Salmonella species: NOT DETECTED
Serratia marcescens: NOT DETECTED
Staphylococcus aureus (BCID): NOT DETECTED
Staphylococcus epidermidis: NOT DETECTED
Staphylococcus lugdunensis: NOT DETECTED
Staphylococcus species: NOT DETECTED
Stenotrophomonas maltophilia: NOT DETECTED
Streptococcus agalactiae: NOT DETECTED
Streptococcus pneumoniae: NOT DETECTED
Streptococcus pyogenes: NOT DETECTED
Streptococcus species: NOT DETECTED

## 2022-04-18 LAB — CBC
HCT: 31 % — ABNORMAL LOW (ref 36.0–46.0)
Hemoglobin: 9.2 g/dL — ABNORMAL LOW (ref 12.0–15.0)
MCH: 26.3 pg (ref 26.0–34.0)
MCHC: 29.7 g/dL — ABNORMAL LOW (ref 30.0–36.0)
MCV: 88.6 fL (ref 80.0–100.0)
Platelets: 503 10*3/uL — ABNORMAL HIGH (ref 150–400)
RBC: 3.5 MIL/uL — ABNORMAL LOW (ref 3.87–5.11)
RDW: 16.2 % — ABNORMAL HIGH (ref 11.5–15.5)
WBC: 16.7 10*3/uL — ABNORMAL HIGH (ref 4.0–10.5)
nRBC: 0.2 % (ref 0.0–0.2)

## 2022-04-18 LAB — PREPARE FRESH FROZEN PLASMA: Unit division: 0

## 2022-04-18 LAB — FOLATE: Folate: 5.8 ng/mL — ABNORMAL LOW (ref >5.9)

## 2022-04-18 LAB — MAGNESIUM: Magnesium: 1.9 mg/dL (ref 1.7–2.4)

## 2022-04-18 LAB — IRON AND TIBC
Iron: 25 ug/dL — ABNORMAL LOW (ref 28–170)
Saturation Ratios: 14 % (ref 10.4–31.8)
TIBC: 182 ug/dL — ABNORMAL LOW (ref 250–450)
UIBC: 157 ug/dL

## 2022-04-18 LAB — RENAL FUNCTION PANEL
Albumin: 2.1 g/dL — ABNORMAL LOW (ref 3.5–5.0)
Anion gap: 6 (ref 5–15)
BUN: 10 mg/dL (ref 6–20)
CO2: 26 mmol/L (ref 22–32)
Calcium: 8 mg/dL — ABNORMAL LOW (ref 8.9–10.3)
Chloride: 101 mmol/L (ref 98–111)
Creatinine, Ser: 0.67 mg/dL (ref 0.44–1.00)
GFR, Estimated: >60 mL/min (ref >60)
Glucose, Bld: 122 mg/dL — ABNORMAL HIGH (ref 70–99)
Phosphorus: 3.7 mg/dL (ref 2.5–4.6)
Potassium: 4.1 mmol/L (ref 3.5–5.1)
Sodium: 133 mmol/L — ABNORMAL LOW (ref 135–145)

## 2022-04-18 LAB — PROTIME-INR
INR: 4.3 (ref 0.8–1.2)
INR: 3.9 — ABNORMAL HIGH (ref 0.8–1.2)
Prothrombin Time: 41.3 seconds — ABNORMAL HIGH (ref 11.4–15.2)
Prothrombin Time: 37.8 seconds — ABNORMAL HIGH (ref 11.4–15.2)

## 2022-04-18 LAB — BPAM FFP
Blood Product Expiration Date: 202305012359
ISSUE DATE / TIME: 202304261429
Unit Type and Rh: 7300

## 2022-04-18 LAB — RETICULOCYTES
Immature Retic Fract: 34.6 % — ABNORMAL HIGH (ref 2.3–15.9)
RBC.: 3.35 MIL/uL — ABNORMAL LOW (ref 3.87–5.11)
Retic Count, Absolute: 43.2 10*3/uL (ref 19.0–186.0)
Retic Ct Pct: 1.3 % (ref 0.4–3.1)

## 2022-04-18 LAB — VITAMIN B12: Vitamin B-12: 180 pg/mL (ref 180–914)

## 2022-04-18 LAB — TSH: TSH: 2.743 u[IU]/mL (ref 0.350–4.500)

## 2022-04-18 LAB — FERRITIN: Ferritin: 294 ng/mL (ref 11–307)

## 2022-04-18 MED ORDER — SODIUM CHLORIDE 0.9 % IV SOLN
200.0000 mg | Freq: Every day | INTRAVENOUS | Status: DC
  Administered 2022-04-18 – 2022-04-24 (×7): 200 mg via INTRAVENOUS
  Filled 2022-04-18 (×6): qty 10
  Filled 2022-04-18: qty 20

## 2022-04-18 MED ORDER — SODIUM CHLORIDE 0.9% IV SOLUTION: Freq: Once | INTRAVENOUS | Status: AC

## 2022-04-18 NOTE — Progress Notes (Signed)
PHARMACY - PHYSICIAN COMMUNICATION ?CRITICAL VALUE ALERT - BLOOD CULTURE IDENTIFICATION (BCID) ? ?Susan Warren is an 46 y.o. female who presented to [facility] on 04/15/2022 with a chief complaint of abdominal pain, found to have progression of large cystic mass in the R abdomen/pelvis ? ?Assessment:  1/2 bottles with Yeast. BCID reporting Candida parapsilosis ? ?Name of physician (or Provider) Contacted: Dr. Cyndia Skeeters, auto ID consult ? ?Current antibiotics: Zosyn 3.375g IV q8 hours, micafungin 200mg  IV qd ? ?Changes to prescribed antibiotics recommended:  ?No changes to current regimen.  ? ?Results for orders placed or performed during the hospital encounter of 04/15/22  ?Blood Culture ID Panel (Reflexed) (Collected: 04/16/2022  3:57 PM)  ?Result Value Ref Range  ? Enterococcus faecalis NOT DETECTED NOT DETECTED  ? Enterococcus Faecium NOT DETECTED NOT DETECTED  ? Listeria monocytogenes NOT DETECTED NOT DETECTED  ? Staphylococcus species NOT DETECTED NOT DETECTED  ? Staphylococcus aureus (BCID) NOT DETECTED NOT DETECTED  ? Staphylococcus epidermidis NOT DETECTED NOT DETECTED  ? Staphylococcus lugdunensis NOT DETECTED NOT DETECTED  ? Streptococcus species NOT DETECTED NOT DETECTED  ? Streptococcus agalactiae NOT DETECTED NOT DETECTED  ? Streptococcus pneumoniae NOT DETECTED NOT DETECTED  ? Streptococcus pyogenes NOT DETECTED NOT DETECTED  ? A.calcoaceticus-baumannii NOT DETECTED NOT DETECTED  ? Bacteroides fragilis NOT DETECTED NOT DETECTED  ? Enterobacterales NOT DETECTED NOT DETECTED  ? Enterobacter cloacae complex NOT DETECTED NOT DETECTED  ? Escherichia coli NOT DETECTED NOT DETECTED  ? Klebsiella aerogenes NOT DETECTED NOT DETECTED  ? Klebsiella oxytoca NOT DETECTED NOT DETECTED  ? Klebsiella pneumoniae NOT DETECTED NOT DETECTED  ? Proteus species NOT DETECTED NOT DETECTED  ? Salmonella species NOT DETECTED NOT DETECTED  ? Serratia marcescens NOT DETECTED NOT DETECTED  ? Haemophilus influenzae NOT DETECTED NOT  DETECTED  ? Neisseria meningitidis NOT DETECTED NOT DETECTED  ? Pseudomonas aeruginosa NOT DETECTED NOT DETECTED  ? Stenotrophomonas maltophilia NOT DETECTED NOT DETECTED  ? Candida albicans NOT DETECTED NOT DETECTED  ? Candida auris NOT DETECTED NOT DETECTED  ? Candida glabrata NOT DETECTED NOT DETECTED  ? Candida krusei NOT DETECTED NOT DETECTED  ? Candida parapsilosis DETECTED (A) NOT DETECTED  ? Candida tropicalis NOT DETECTED NOT DETECTED  ? Cryptococcus neoformans/gattii NOT DETECTED NOT DETECTED  ? ? ?Dimple Nanas ?04/18/2022  1:29 PM ? ?

## 2022-04-18 NOTE — Assessment & Plan Note (Addendum)
Recent Labs  ?  04/09/22 ?0133 04/10/22 ?0105 04/11/22 ?0113 04/15/22 ?1950 04/17/22 ?0501 04/18/22 ?[id number] 04/19/22 ?[id number] 04/21/22 ?[id number] 04/22/22 ?0025 04/23/22 ?[id number]  ?HGB 9.7* 10.6* 9.5* 11.0* 9.7* 9.2* 10.0* 10.5* 10.1* 10.3*  ?Stable.  Anemia panel consistent with anemia of chronic disease and vitamin B12 deficiency ?-Vitamin B12 1000 mcg IM x2 followed by p.o. ?-Monitor H&H ? ?

## 2022-04-18 NOTE — Consult Note (Signed)
?   ? ? ? ? ?[facility]   ? ?Date of Admission:  04/15/2022    ? ?Total days of antibiotics 1  ? Micafungin  ? Zosyn  ?        ? ?Reason for Consult: Candidemia     ?Referring Provider: autoconsultation  ?Primary Care Provider: Storm Frisk, MD  ? ? ?Assessment: ?Susan Warren is a 46 y.o. female admitted with worsening abdominal pain, chills, malaise and poor appetite.  CT scan of the abdomen revealed a large cyst in the right abdomen and pelvis measuring 24.2 x 24.3 x 24.6 cm - INR currently prohibiting safe drainage but IR following for timing. In the interim her blood cultures have returned with yeast growing and candida parapsilosis on the BCID. Will start micafungin load now. She has not had any discernable changes to her vision and no ophthalmic complaints. Would ensure we check a TTE for her as well. Source of bacteremia most likely related to the very large intra-abdominal fluid collection.  ?Would continue zosyn for presumed polymicrobial infection.  ? ? ?Plan: ?Continue zosyn ?Follow for IR intervention  ?Repeat blood cultures tomorrow AM ?TTE ?Continue micafungin  ?  ? ?Principal Problem: ?  Abdominal pain ?Active Problems: ?  Hypertension ?  GERD (gastroesophageal reflux disease) ?  History of pulmonary embolism ?  Right leg DVT (HCC) ?  Lymphedema of both lower extremities ?  Morbid obesity with body mass index of 70 and over in adult [facility]) ?  Pelvic mass in female ?  Supratherapeutic INR ? ? ? (feeding supplement) PROSource Plus  30 mL Oral BID BM  ? amLODipine  10 mg Oral q1800  ? pantoprazole (PROTONIX) IV  40 mg Intravenous Q24H  ? ? ?HPI: Susan Warren is a 46 y.o. female admitted from home with complaints of abdominal pain, nausea, vomiting and diarrhea.  ? ?PMx DVT/PE, GERD, Hepatic cyst, HTN, Stage IV lymphedema b/l LEs, morbid obesity (BMI 82.39), PE, OSA.  ? ?Recently admitted at [facility] for Pneumonia 4/17 - 4/20 and D/C'd on augmentin PO to finish out 5d  course. She says she never felt good after they discharged her home and continued with malaise, poor appetite and abdominal pain. She came back to ER to investigate the same and CT scan of the abdomen revealed a large cyst in the right abdomen and pelvis measuring 24.2 x 24.3 x 24.6 cm. Blood cultures now growing yeast with ID auto-consulted for assistance.  ?IR has been consulted for drainage however INRs have been prohibiting for procedure. She feels quite uncomfortable and just blah.  ?No fevers or chills since hospital stay and does feel slightly better.  ? ? ?Review of Systems: ?Review of Systems  ?Constitutional:  Positive for diaphoresis and malaise/fatigue. Negative for chills and fever.  ?Respiratory:  Negative for cough and shortness of breath.   ?Cardiovascular:  Positive for leg swelling (chronic). Negative for chest pain.  ?Gastrointestinal:  Positive for abdominal pain and nausea. Negative for constipation and diarrhea.  ?Genitourinary: Negative.   ?Skin:  Negative for rash.  ?Neurological:  Negative for headaches.  ? ?Past Medical History:  ?Diagnosis Date  ? Allergy   ? Anxiety   ? Depression   ? DVT (deep venous thrombosis) (HCC)   ? GERD (gastroesophageal reflux disease)   ? Hepatic cyst   ? Hypertension   ? Lymphedema of both lower extremities   ? Migraines   ? Morbid obesity with BMI  of 70 and over, adult [facility])   ? PE (pulmonary thromboembolism) (HCC) 03/2019  ? Sleep apnea   ? ? ?Social History  ? ?Tobacco Use  ? Smoking status: Never  ? Smokeless tobacco: Never  ?Vaping Use  ? Vaping Use: Never used  ?Substance Use Topics  ? Alcohol use: Yes  ?  Comment: occ  ? Drug use: No  ? ? ?Family History  ?Problem Relation Age of Onset  ? Hypertension Mother   ? Thyroid disease Mother   ? Hypertension Maternal Grandmother   ? Diabetes Maternal Grandmother   ? Breast cancer Maternal Grandmother   ? Stroke Maternal Grandmother   ? Heart attack Neg Hx   ? ?Allergies  ?Allergen Reactions  ? Adhesive [Tape]  Anaphylaxis and Swelling  ?  *Adhesive Spray*   ? ? ?OBJECTIVE: ?Blood pressure (!) 143/63, pulse 99, temperature 98.5 ?F (36.9 ?C), temperature source Oral, resp. rate 18, height 5\' 4"  (1.626 m), weight (!) 217.7 kg, SpO2 92 %. ? ?Physical Exam ?Constitutional:   ?   Appearance: She is obese. She is not ill-appearing.  ?   Comments: Resting comfortably in bed in no distress.   ?Cardiovascular:  ?   Rate and Rhythm: Normal rate and regular rhythm.  ?Abdominal:  ?   Tenderness: There is generalized abdominal tenderness.  ?Skin: ?   General: Skin is warm and dry.  ?Neurological:  ?   Mental Status: She is alert and oriented to person, place, and time.  ? ? ?Lab Results ?Lab Results  ?Component Value Date  ? WBC 16.7 (H) 04/18/2022  ? HGB 9.2 (L) 04/18/2022  ? HCT 31.0 (L) 04/18/2022  ? MCV 88.6 04/18/2022  ? PLT 503 (H) 04/18/2022  ?  ?Lab Results  ?Component Value Date  ? CREATININE 0.67 04/18/2022  ? BUN 10 04/18/2022  ? NA 133 (L) 04/18/2022  ? K 4.1 04/18/2022  ? CL 101 04/18/2022  ? CO2 26 04/18/2022  ?  ?Lab Results  ?Component Value Date  ? ALT 12 04/17/2022  ? AST 26 04/17/2022  ? ALKPHOS 90 04/17/2022  ? BILITOT 0.8 04/17/2022  ?  ? ?Microbiology: ?Recent Results (from the past 240 hour(s))  ?Expectorated Sputum Assessment w Gram Stain, Rflx to Resp Cult     Status: None  ? Collection Time: 04/10/22  4:02 PM  ? Specimen: Expectorated Sputum  ?Result Value Ref Range Status  ? Specimen Description EXPECTORATED SPUTUM  Final  ? Special Requests NONE  Final  ? Sputum evaluation   Final  ?  Sputum specimen not acceptable for testing.  Please recollect.   ?Gram Stain Report Called to,Read Back By and Verified With:  RN T. PENNINGTON 04/12/22 @1715  FH ?Performed at [facility], [address] [place], [address] [place] ?  ? Report Status 04/10/2022 FINAL  Final  ?Blood culture (routine x 2)     Status: Abnormal (Preliminary result)  ? Collection Time: 04/16/22  3:57 PM  ? Specimen: BLOOD  ?Result Value Ref  Range Status  ? Specimen Description   Final  ?  BLOOD BLOOD RIGHT HAND ?Performed at [facility], [address] [place], [place] ?  ? Special Requests   Final  ?  BOTTLES DRAWN AEROBIC ONLY Blood Culture adequate volume ?Performed at [facility], [address] [place], [place] ?  ? Culture  Setup Time (A)  Final  ?  YEAST ?AEROBIC BOTTLE ONLY ?Organism  ID to follow ?Performed at [facility], [address] [place], [place] [address] ?  ? Culture YEAST  Final  ? Report Status PENDING  Incomplete  ?Blood culture (routine x 2)     Status: None (Preliminary result)  ? Collection Time: 04/16/22  4:14 PM  ? Specimen: BLOOD  ?Result Value Ref Range Status  ? Specimen Description   Final  ?  BLOOD RIGHT ANTECUBITAL ?Performed at [facility], [address] [place], [place] [address] ?  ? Special Requests   Final  ?  BOTTLES DRAWN AEROBIC ONLY Blood Culture adequate volume ?Performed at [facility], [address] [place], [place] [address] ?  ? Culture   Final  ?  NO GROWTH 2 DAYS ?Performed at [facility], [address] [place], [place] [address] ?  ? Report Status PENDING  Incomplete  ? ? ? ?Rexene AlbertsStephanie Glorimar Stroope, MSN, NP-C ?[facility] ?[facility]  ?[email or web address] ?Pager: [phone] ?Office: [phone] ?[facility] Main Line: [phone] ?*Secure Chat Communication Welcome  ? ?

## 2022-04-18 NOTE — Assessment & Plan Note (Addendum)
Resolved

## 2022-04-18 NOTE — Progress Notes (Signed)
?PROGRESS NOTE ? ?Susan Warren HBZ:[id number] DOB: 02-Jul-1976  ? ?PCP: Storm Frisk, MD ? ?Patient is from: Home.  Lives with son.  Independently ambulates at baseline ? ?DOA: 04/15/2022 LOS: 1 ? ?Chief complaints ?Chief Complaint  ?Patient presents with  ? Abdominal Pain  ? Nausea  ? Vomiting  ? Diarrhea  ?  ? ?Brief Narrative / Interim history: ?46 year old F with PMH of DVT/PE on warfarin, morbid obesity, lymphedema, anxiety and depression presenting with nausea, vomiting and abdominal pain, and admitted for abdominal pain.  CT abdomen and pelvis showed interval progression of the large cystic mass in the right abdomen and pelvis measuring about 24 x 24 x 24 cm and hepatic cirrhosis but no acute finding.  She had leukocytosis to 17.6.  INR was supratherapeutic.  She was started on IV Zosyn.  IR consulted and planning drainage once INR allows.  Reversing INR with FFP  ? ?Subjective: ?Seen and examined earlier this morning.  No major events overnight or this morning.  Reports improvement in abdominal pain.  She says she no longer have the sharp pain but feels sore.  She denies nausea or vomiting.  She denies UTI symptoms or vaginal discharge. ? ?Objective: ?Vitals:  ? 04/18/22 1003 04/18/22 1035 04/18/22 1211 04/18/22 1303  ?BP: 139/67 (!) 154/76 (!) 143/63 133/68  ?Pulse: (!) 101 100 99 98  ?Resp: 18 18 18  (!) 22  ?Temp: 98.8 ?F (37.1 ?C) 98.9 ?F (37.2 ?C) 98.5 ?F (36.9 ?C) 98.7 ?F (37.1 ?C)  ?TempSrc: Oral Oral Oral Oral  ?SpO2: 90% 94% 92% 94%  ?Weight:      ?Height:      ? ? ?Examination: ? ?GENERAL: No apparent distress.  Nontoxic. ?HEENT: MMM.  Vision and hearing grossly intact.  ?NECK: Supple.  No apparent JVD.  ?RESP:  No IWOB.  Fair aeration bilaterally. ?CVS:  RRR. Heart sounds normal.  ?ABD/GI/GU: BS+. Abd soft.  Mild tenderness in RLQ.  No rebound or guarding. ?MSK/EXT: Moves extremities.  Significant lymphedema in BLE. ?SKIN: no apparent skin lesion or wound ?NEURO: Awake and alert. Oriented  appropriately.  No apparent focal neuro deficit. ?PSYCH: Calm. Normal affect.  ? ?Procedures:  ?None ? ?Microbiology summarized: ?Blood cultures NGTD. ? ?Assessment and Plan: ?* Abdominal pain ?Likely due to progressive intra-abdominal and pelvic large cystic mass as noted on CT abdomen and pelvis. BCID with Candidia Parapsilosis.  Still with significant leukocytosis. Abdominal pain improved.  Nausea and vomiting seems to have resolved. ?-Appreciate input by IR-plan for drainage once INR is within acceptable range.  INR 4.3 today ?-Additional FFP to reverse INR today ?-Continue IV Zosyn for now ?-ID involved due to possible candidemia ? ?Candidemia (HCC) ?BCID with Candida parpasilosis ?-ID autoconsulted ? ? ?Supratherapeutic INR ?INR 5.9>> 4.3 ?-Additional FFP x1 today ?-Continue holding warfarin ? ? ?Pelvic mass in female ?CT abdomen and pelvis showed a large cystic mass in the right abdomen and pelvis measuring 24 x 24 x 24 cm. ?-See abdominal pain ? ?Leukocytosis ?Intra-abdominal infection?  ?-Continue antibiotic as above ?-Continue monitoring ? ?Normocytic anemia ?Recent Labs  ?  04/07/22 ?2341 04/09/22 ?0133 04/10/22 ?0105 04/11/22 ?0113 04/15/22 ?1950 04/17/22 ?0501 04/18/22 ?04/20/22  ?HGB 10.7* 9.7* 10.6* 9.5* 11.0* 9.7* 9.2*  ?Relatively stable. ?-Check anemia panel ?-Continue monitoring ? ? ?Hyponatremia ?Mild. Monitor ? ?Morbid obesity with body mass index of 70 and over in adult [facility]) ?Body mass index is 82.39 kg/m?[facility] Difficult situation. ?-Encourage lifestyle change to lose weight ? ?Right leg  DVT (HCC) ?Reversing INR for procedure ? ?History of pulmonary embolism ?On warfarin at home.  INR supratherapeutic. ?-Reversing INR for procedure ? ?GERD (gastroesophageal reflux disease) ?Continue PPI ? ?Hypertension ?Normotensive. ?-Continue home meds ? ? ?DVT prophylaxis:  ?Supratherapeutic INR ? ?Code Status: Full code ?Family Communication: Patient and/or RN. Available if any question.  ?Level of care:  Telemetry ?Status is: Inpatient ?The patient will remain inpatient because: Abdominal pain requiring IV antibiotics and further evaluation by IR and supratherapeutic INR, and possible candidemia. ? ? ?Final disposition: TBD ? ?Consultants:  ?Interventional radiology ?Infectious disease ? ?Sch Meds:  ?Scheduled Meds: ? (feeding supplement) PROSource Plus  30 mL Oral BID BM  ? amLODipine  10 mg Oral q1800  ? pantoprazole (PROTONIX) IV  40 mg Intravenous Q24H  ? ?Continuous Infusions: ? micafungin [facility](MYCAMINE) IV 200 mg (04/18/22 1320)  ? ondansetron (ZOFRAN) IV    ? piperacillin-tazobactam (ZOSYN)  IV 3.375 g (04/18/22 0507)  ? ?PRN Meds:.acetaminophen **OR** acetaminophen, HYDROmorphone (DILAUDID) injection, lip balm, ondansetron (ZOFRAN) IV, simethicone ? ?Antimicrobials: ?Anti-infectives (From admission, onward)  ? ? Start     Dose/Rate Route Frequency Ordered Stop  ? 04/18/22 1245  micafungin (MYCAMINE) 200 mg in sodium chloride 0.9 % 100 mL IVPB       ? 200 mg ?120 mL/hr over 1 Hours Intravenous Daily 04/18/22 1152    ? 04/16/22 1200  piperacillin-tazobactam (ZOSYN) IVPB 3.375 g  Status:  Discontinued       ?Note to Pharmacy: High volume of distribution. Please adjust dosage as needed.TY.  ? 3.375 g ?100 mL/hr over 30 Minutes Intravenous Every 6 hours 04/16/22 0859 04/16/22 0902  ? 04/16/22 0915  piperacillin-tazobactam (ZOSYN) IVPB 3.375 g       ? 3.375 g ?12.5 mL/hr over 240 Minutes Intravenous Every 8 hours 04/16/22 0902    ? 04/16/22 0430  piperacillin-tazobactam (ZOSYN) IVPB 3.375 g       ? 3.375 g ?100 mL/hr over 30 Minutes Intravenous  Once 04/16/22 0427 04/16/22 0530  ? ?  ? ? ? ?I have personally reviewed the following labs and images: ?CBC: ?Recent Labs  ?Lab 04/15/22 ?1950 04/17/22 ?0501 04/18/22 ?0414  ?WBC 17.6* 15.2* 16.7*  ?NEUTROABS 14.4*  --   --   ?HGB 11.0* 9.7* 9.2*  ?HCT 37.6 31.3* 31.0*  ?MCV 88.9 88.2 88.6  ?PLT 531* 514* 503*  ? ?BMP &GFR ?Recent Labs  ?Lab 04/15/22 ?1950 04/17/22 ?0501  04/18/22 ?0414  ?NA 134* 137 133*  ?K 3.7 4.1 4.1  ?CL 98 101 101  ?CO2 25 26 26   ?GLUCOSE 107* 103* 122*  ?BUN 9 12 10   ?CREATININE 0.64 0.79 0.67  ?CALCIUM 8.4* 8.2* 8.0*  ?MG  --   --  1.9  ?PHOS  --   --  3.7  ? ?Estimated Creatinine Clearance: 162.8 mL/min (by C-G formula based on SCr of 0.67 mg/dL). ?Liver & Pancreas: ?Recent Labs  ?Lab 04/15/22 ?1950 04/17/22 ?0501 04/18/22 ?0414  ?AST 27 26  --   ?ALT 14 12  --   ?ALKPHOS 96 90  --   ?BILITOT 0.8 0.8  --   ?PROT 8.7* 7.5  --   ?ALBUMIN 2.7* 2.1* 2.1*  ? ?Recent Labs  ?Lab 04/15/22 ?1950  ?LIPASE 36  ? ?No results for input(s): AMMONIA in the last 168 hours. ?Diabetic: ?No results for input(s): HGBA1C in the last 72 hours. ?No results for input(s): GLUCAP in the last 168 hours. ? ?Cardiac Enzymes: ?No results for input(s):  CKTOTAL, CKMB, CKMBINDEX, TROPONINI in the last 168 hours. ?No results for input(s): PROBNP in the last 8760 hours. ?Coagulation Profile: ?Recent Labs  ?Lab 04/16/22 ?9604 04/16/22 ?1203 04/17/22 ?0501 04/17/22 ?1809 04/18/22 ?0414  ?INR 5.7* 5.7* 5.9* 4.8* 4.3*  ? ?Thyroid Function Tests: ?Recent Labs  ?  04/18/22 ?0414  ?TSH 2.743  ? ?Lipid Profile: ?No results for input(s): CHOL, HDL, LDLCALC, TRIG, CHOLHDL, LDLDIRECT in the last 72 hours. ?Anemia Panel: ?No results for input(s): VITAMINB12, FOLATE, FERRITIN, TIBC, IRON, RETICCTPCT in the last 72 hours. ?Urine analysis: ?   ?Component Value Date/Time  ? COLORURINE AMBER (A) 04/16/2022 0003  ? APPEARANCEUR HAZY (A) 04/16/2022 0003  ? LABSPEC 1.015 04/16/2022 0003  ? PHURINE 5.0 04/16/2022 0003  ? GLUCOSEU NEGATIVE 04/16/2022 0003  ? GLUCOSEU NEGATIVE 01/25/2014 1049  ? HGBUR LARGE (A) 04/16/2022 0003  ? BILIRUBINUR NEGATIVE 04/16/2022 0003  ? KETONESUR NEGATIVE 04/16/2022 0003  ? PROTEINUR 30 (A) 04/16/2022 0003  ? UROBILINOGEN 0.2 01/25/2014 1049  ? NITRITE NEGATIVE 04/16/2022 0003  ? LEUKOCYTESUR SMALL (A) 04/16/2022 0003  ? ?Sepsis Labs: ?Invalid input(s): PROCALCITONIN,  LACTICIDVEN ? ?Microbiology: ?Recent Results (from the past 240 hour(s))  ?Expectorated Sputum Assessment w Gram Stain, Rflx to Resp Cult     Status: None  ? Collection Time: 04/10/22  4:02 PM  ? Specimen: Expectorated Sputum  ?Resu

## 2022-04-18 NOTE — Assessment & Plan Note (Addendum)
Improved after draining abdominal cyst. ?-Continue antibiotic as above ?-Continue monitoring ?

## 2022-04-18 NOTE — Assessment & Plan Note (Addendum)
BCID and blood culture with Candida parpasilosis in 1 out of 2 bottles.  She has no visual change.  Repeat blood cultures NGTD.  TTE and TEE without vegetation. ?-Continue micafungin. ?-Follow fluid culture. ? ?

## 2022-04-19 ENCOUNTER — Inpatient Hospital Stay (HOSPITAL_COMMUNITY): Payer: 59

## 2022-04-19 ENCOUNTER — Telehealth: Payer: Self-pay | Admitting: Critical Care Medicine

## 2022-04-19 DIAGNOSIS — R7881 Bacteremia: Secondary | ICD-10-CM

## 2022-04-19 DIAGNOSIS — E538 Deficiency of other specified B group vitamins: Secondary | ICD-10-CM

## 2022-04-19 DIAGNOSIS — Z86711 Personal history of pulmonary embolism: Secondary | ICD-10-CM | POA: Diagnosis not present

## 2022-04-19 DIAGNOSIS — K219 Gastro-esophageal reflux disease without esophagitis: Secondary | ICD-10-CM | POA: Diagnosis not present

## 2022-04-19 DIAGNOSIS — D72825 Bandemia: Secondary | ICD-10-CM

## 2022-04-19 DIAGNOSIS — E871 Hypo-osmolality and hyponatremia: Secondary | ICD-10-CM

## 2022-04-19 DIAGNOSIS — I10 Essential (primary) hypertension: Secondary | ICD-10-CM | POA: Diagnosis not present

## 2022-04-19 DIAGNOSIS — R1084 Generalized abdominal pain: Secondary | ICD-10-CM | POA: Diagnosis not present

## 2022-04-19 LAB — COMPREHENSIVE METABOLIC PANEL
ALT: 14 U/L (ref 0–44)
AST: 26 U/L (ref 15–41)
Albumin: 2 g/dL — ABNORMAL LOW (ref 3.5–5.0)
Alkaline Phosphatase: 90 U/L (ref 38–126)
Anion gap: 11 (ref 5–15)
BUN: 10 mg/dL (ref 6–20)
CO2: 25 mmol/L (ref 22–32)
Calcium: 8 mg/dL — ABNORMAL LOW (ref 8.9–10.3)
Chloride: 97 mmol/L — ABNORMAL LOW (ref 98–111)
Creatinine, Ser: 0.58 mg/dL (ref 0.44–1.00)
GFR, Estimated: >60 mL/min (ref >60)
Glucose, Bld: 144 mg/dL — ABNORMAL HIGH (ref 70–99)
Potassium: 4.1 mmol/L (ref 3.5–5.1)
Sodium: 133 mmol/L — ABNORMAL LOW (ref 135–145)
Total Bilirubin: 0.6 mg/dL (ref 0.3–1.2)
Total Protein: 7.6 g/dL (ref 6.5–8.1)

## 2022-04-19 LAB — ECHOCARDIOGRAM COMPLETE
AR max vel: 2.61 cm2
AV Peak grad: 14.4 mmHg
Ao pk vel: 1.9 m/s
Area-P 1/2: 5.27 cm2
Height: 64 in
S' Lateral: 2.9 cm
Weight: 7680 oz

## 2022-04-19 LAB — PROTIME-INR
INR: 3.3 — ABNORMAL HIGH (ref 0.8–1.2)
INR: 3.1 — ABNORMAL HIGH (ref 0.8–1.2)
INR: 2.4 — ABNORMAL HIGH (ref 0.8–1.2)
Prothrombin Time: 32.9 seconds — ABNORMAL HIGH (ref 11.4–15.2)
Prothrombin Time: 31.9 seconds — ABNORMAL HIGH (ref 11.4–15.2)
Prothrombin Time: 26.3 seconds — ABNORMAL HIGH (ref 11.4–15.2)

## 2022-04-19 LAB — CBC WITH DIFFERENTIAL/PLATELET
Abs Immature Granulocytes: 1.26 10*3/uL — ABNORMAL HIGH (ref 0.00–0.07)
Basophils Absolute: 0.1 10*3/uL (ref 0.0–0.1)
Basophils Relative: 0 %
Eosinophils Absolute: 0.3 10*3/uL (ref 0.0–0.5)
Eosinophils Relative: 2 %
HCT: 33.5 % — ABNORMAL LOW (ref 36.0–46.0)
Hemoglobin: 10 g/dL — ABNORMAL LOW (ref 12.0–15.0)
Immature Granulocytes: 8 %
Lymphocytes Relative: 8 %
Lymphs Abs: 1.2 10*3/uL (ref 0.7–4.0)
MCH: 26.7 pg (ref 26.0–34.0)
MCHC: 29.9 g/dL — ABNORMAL LOW (ref 30.0–36.0)
MCV: 89.3 fL (ref 80.0–100.0)
Monocytes Absolute: 1 10*3/uL (ref 0.1–1.0)
Monocytes Relative: 6 %
Neutro Abs: 12 10*3/uL — ABNORMAL HIGH (ref 1.7–7.7)
Neutrophils Relative %: 76 %
Platelets: 512 10*3/uL — ABNORMAL HIGH (ref 150–400)
RBC: 3.75 MIL/uL — ABNORMAL LOW (ref 3.87–5.11)
RDW: 16 % — ABNORMAL HIGH (ref 11.5–15.5)
WBC: 15.8 10*3/uL — ABNORMAL HIGH (ref 4.0–10.5)
nRBC: 0.3 % — ABNORMAL HIGH (ref 0.0–0.2)

## 2022-04-19 LAB — BPAM FFP
Blood Product Expiration Date: 202305022359
ISSUE DATE / TIME: 202304271015
Unit Type and Rh: 7300

## 2022-04-19 LAB — PHOSPHORUS: Phosphorus: 3.2 mg/dL (ref 2.5–4.6)

## 2022-04-19 LAB — PREPARE FRESH FROZEN PLASMA: Unit division: 0

## 2022-04-19 LAB — MAGNESIUM: Magnesium: 1.9 mg/dL (ref 1.7–2.4)

## 2022-04-19 MED ORDER — CYANOCOBALAMIN 1000 MCG/ML IJ SOLN
1000.0000 ug | Freq: Every day | INTRAMUSCULAR | Status: AC
  Administered 2022-04-19 – 2022-04-20 (×2): 1000 ug via INTRAMUSCULAR
  Filled 2022-04-19 (×2): qty 1

## 2022-04-19 MED ORDER — HYDROMORPHONE HCL 1 MG/ML IJ SOLN
0.5000 mg | INTRAMUSCULAR | Status: DC | PRN
  Administered 2022-04-19 – 2022-04-26 (×41): 0.5 mg via INTRAVENOUS
  Filled 2022-04-19 (×41): qty 0.5

## 2022-04-19 MED ORDER — PHYTONADIONE 5 MG PO TABS
5.0000 mg | ORAL_TABLET | Freq: Once | ORAL | Status: DC
  Filled 2022-04-19: qty 1

## 2022-04-19 MED ORDER — VITAMIN B-12 1000 MCG PO TABS
1000.0000 ug | ORAL_TABLET | Freq: Every day | ORAL | Status: DC
  Administered 2022-04-21 – 2022-04-29 (×7): 1000 ug via ORAL
  Filled 2022-04-19 (×8): qty 1

## 2022-04-19 MED ORDER — VITAMIN K1 10 MG/ML IJ SOLN
5.0000 mg | Freq: Once | INTRAVENOUS | Status: AC
  Administered 2022-04-19: 5 mg via INTRAVENOUS
  Filled 2022-04-19 (×2): qty 0.5

## 2022-04-19 MED ORDER — SODIUM CHLORIDE 0.9 % IV SOLN
8.0000 mg | Freq: Four times a day (QID) | INTRAVENOUS | Status: DC | PRN
  Administered 2022-04-24: 8 mg via INTRAVENOUS
  Filled 2022-04-19 (×2): qty 4

## 2022-04-19 NOTE — Telephone Encounter (Signed)
Returned call. Pt's mom wanted to inform me that the pt's INR is decreasing in the hospital. I explained to her that this is desirable so they can proceed with a procedure to address her intraabdominal cyst. She verbalized understanding and thanked me for the call.  ?

## 2022-04-19 NOTE — Progress Notes (Signed)
?PROGRESS NOTE ? ?Susan Warren [id number] DOB: 29-Mar-1976  ? ?PCP: Elsie Stain, MD ? ?Patient is from: Home.  Lives with son.  Independently ambulates at baseline ? ?DOA: 04/15/2022 LOS: 2 ? ?Chief complaints ?Chief Complaint  ?Patient presents with  ? Abdominal Pain  ? Nausea  ? Vomiting  ? Diarrhea  ?  ? ?Brief Narrative / Interim history: ?46 year old F with PMH of DVT/PE on warfarin, morbid obesity, lymphedema, anxiety and depression presenting with nausea, vomiting and abdominal pain, and admitted for abdominal pain.  CT abdomen and pelvis showed interval progression of the large cystic mass in the right abdomen and pelvis measuring about 24 x 24 x 24 cm and hepatic cirrhosis but no acute finding.  She had leukocytosis to 17.6.  INR was supratherapeutic.  She was started on IV Zosyn.  IR consulted and planning drainage once INR allows.  Reversing INR with FFP and vit K.  ? ?BC ID with Candida parapsilosis.  ID consulted and started micafungin. ?  ? ?Subjective: ?Seen and examined earlier this morning.  No major events overnight of this morning.  Still with some abdominal pain but improved.  She describes the pain as pressure-like.  No longer sharp.  She denies nausea or vomiting.  She is anxious about vitamin K but reassured after further discussion about the need of drainage as soon as possible for better yield. ? ?Objective: ?Vitals:  ? 04/18/22 1303 04/18/22 1959 04/19/22 0612 04/19/22 1111  ?BP: 133/68 137/69 (!) 142/88 135/73  ?Pulse: 98 99 (!) 104 95  ?Resp: (!) 22 18 20 18   ?Temp: 98.7 ?F (37.1 ?C) 98.4 ?F (36.9 ?C) 99.6 ?F (37.6 ?C) 98.6 ?F (37 ?C)  ?TempSrc: Oral Oral Oral Oral  ?SpO2: 94% 94% 94% 92%  ?Weight:      ?Height:      ? ? ?Examination: ? ?GENERAL: No apparent distress.  Nontoxic. ?HEENT: MMM.  Vision and hearing grossly intact.  ?NECK: Supple.  No apparent JVD.  ?RESP:  No IWOB.  Fair aeration bilaterally. ?CVS:  RRR. Heart sounds normal.  ?ABD/GI/GU: BS+. Abd soft.  Mild RLQ  tenderness.  No rebound or guarding. ?MSK/EXT:  Moves extremities.  Significant BLE lymphedema.  ?SKIN: no apparent skin lesion or wound ?NEURO: Awake and alert. Oriented appropriately.  No apparent focal neuro deficit. ?PSYCH: Calm. Normal affect.  ? ?Procedures:  ?None ? ?Microbiology summarized: ?BC ID and blood culture with Candida Parapsilosis in 1 out of 2 bottles. ? ?Assessment and Plan: ?* Abdominal pain ?Likely due to progressive intra-abdominal and pelvic large cystic mass as noted on CT abdomen and pelvis. BCID with Candidia Parapsilosis.  Still with significant leukocytosis. Abdominal pain improved.  Nausea and vomiting seems to have resolved. ?-Appreciate input by IR-plan for drainage once INR is within acceptable range.  ?-Received FFP 1/26 and 40x1.  We will order 3.3. ?-IV vitamin K 5 mg x 1 to reverse INR quicker so IR can proceed with drainage hopefully today ?-Continue IV Zosyn and micafungin per ID ? ?Candidemia ([place]) ?BCID and blood culture with Candida parpasilosis in 1 out of 2 bottles.  She has no visual change. ?-ID autoconsulted and started micafungin on 4/27 ?-Repeat blood culture? ? ? ?Supratherapeutic INR ?INR 5.9>> 3.3 after FFP x2 ?-IV vitamin K 5 mg x 1 as above ?-Continue holding warfarin ?-Recheck INR this afternoon ? ? ?Pelvic mass in female ?CT abdomen and pelvis showed a large cystic mass in the right abdomen and pelvis measuring 24  x 24 x 24 cm. ?-See abdominal pain ? ?Leukocytosis ?Intra-abdominal infection?  ?-Continue antibiotic as above ?-Continue monitoring ? ?Normocytic anemia ?Recent Labs  ?  04/07/22 ?2341 04/09/22 ?0133 04/10/22 ?0105 04/11/22 ?0113 04/15/22 ?1950 04/17/22 ?0501 04/18/22 ?[id number] 04/19/22 ?[id number]  ?HGB 10.7* 9.7* 10.6* 9.5* 11.0* 9.7* 9.2* 10.0*  ?Stable.  Anemia panel consistent with anemia of chronic disease and vitamin B12 deficiency ?-Vitamin B12 1000 mcg IM x2 followed by p.o. ?-Monitor H&H ? ? ?Hyponatremia ?Mild. Monitor ? ?Morbid obesity with body  mass index of 70 and over in adult [facility]) ?Body mass index is 82.39 kg/m?Marland Kitchen Difficult situation. ?-Encourage lifestyle change to lose weight ? ?Right leg DVT ([place]) ?Reversing INR for procedure ? ?History of pulmonary embolism ?On warfarin at home.  INR supratherapeutic. ?-Reversing INR for procedure ? ?GERD (gastroesophageal reflux disease) ?Continue PPI ? ?Hypertension ?Normotensive. ?-Continue home meds ? ? ?DVT prophylaxis:  ?Supratherapeutic INR ? ?Code Status: Full code ?Family Communication: Patient and/or RN. Available if any question.  ?Level of care: Telemetry ?Status is: Inpatient ?The patient will remain inpatient because: Abdominal pain requiring IV antibiotics and further evaluation by IR and supratherapeutic INR, and possible candidemia. ? ? ?Final disposition: TBD ? ?Consultants:  ?Interventional radiology ?Infectious disease ? ?Sch Meds:  ?Scheduled Meds: ? (feeding supplement) PROSource Plus  30 mL Oral BID BM  ? amLODipine  10 mg Oral q1800  ? cyanocobalamin  1,000 mcg Intramuscular Daily  ? Followed by  ? [START ON 04/21/2022] vitamin B-12  1,000 mcg Oral Daily  ? pantoprazole (PROTONIX) IV  40 mg Intravenous Q24H  ? ?Continuous Infusions: ? micafungin [facility]) IV 200 mg (04/19/22 [id number])  ? ondansetron (ZOFRAN) IV    ? piperacillin-tazobactam (ZOSYN)  IV 3.375 g (04/19/22 0505)  ? ?PRN Meds:.acetaminophen **OR** acetaminophen, HYDROmorphone (DILAUDID) injection, lip balm, ondansetron (ZOFRAN) IV, simethicone ? ?Antimicrobials: ?Anti-infectives (From admission, onward)  ? ? Start     Dose/Rate Route Frequency Ordered Stop  ? 04/18/22 1245  micafungin (MYCAMINE) 200 mg in sodium chloride 0.9 % 100 mL IVPB       ? 200 mg ?120 mL/hr over 1 Hours Intravenous Daily 04/18/22 1152    ? 04/16/22 1200  piperacillin-tazobactam (ZOSYN) IVPB 3.375 g  Status:  Discontinued       ?Note to Pharmacy: High volume of distribution. Please adjust dosage as needed.TY.  ? 3.375 g ?100 mL/hr over 30 Minutes Intravenous  Every 6 hours 04/16/22 0859 04/16/22 0902  ? 04/16/22 0915  piperacillin-tazobactam (ZOSYN) IVPB 3.375 g       ? 3.375 g ?12.5 mL/hr over 240 Minutes Intravenous Every 8 hours 04/16/22 0902    ? 04/16/22 0430  piperacillin-tazobactam (ZOSYN) IVPB 3.375 g       ? 3.375 g ?100 mL/hr over 30 Minutes Intravenous  Once 04/16/22 0427 04/16/22 0530  ? ?  ? ? ? ?I have personally reviewed the following labs and images: ?CBC: ?Recent Labs  ?Lab 04/15/22 ?1950 04/17/22 ?0501 04/18/22 ?[id number] 04/19/22 ?0439  ?WBC 17.6* 15.2* 16.7* 15.8*  ?NEUTROABS 14.4*  --   --  12.0*  ?HGB 11.0* 9.7* 9.2* 10.0*  ?HCT 37.6 31.3* 31.0* 33.5*  ?MCV 88.9 88.2 88.6 89.3  ?PLT 531* 514* 503* 512*  ? ?BMP &GFR ?Recent Labs  ?Lab 04/15/22 ?1950 04/17/22 ?0501 04/18/22 ?[id number] 04/19/22 ?0439  ?NA 134* 137 133* 133*  ?K 3.7 4.1 4.1 4.1  ?CL 98 101 101 97*  ?CO2 25 26 26 25   ?GLUCOSE 107* 103* 122*  144*  ?BUN 9 12 10 10   ?CREATININE 0.64 0.79 0.67 0.58  ?CALCIUM 8.4* 8.2* 8.0* 8.0*  ?MG  --   --  1.9 1.9  ?PHOS  --   --  3.7 3.2  ? ?Estimated Creatinine Clearance: 162.8 mL/min (by C-G formula based on SCr of 0.58 mg/dL). ?Liver & Pancreas: ?Recent Labs  ?Lab 04/15/22 ?1950 04/17/22 ?0501 04/18/22 ?[id number] 04/19/22 ?0439  ?AST 27 26  --  26  ?ALT 14 12  --  14  ?ALKPHOS 96 90  --  90  ?BILITOT 0.8 0.8  --  0.6  ?PROT 8.7* 7.5  --  7.6  ?ALBUMIN 2.7* 2.1* 2.1* 2.0*  ? ?Recent Labs  ?Lab 04/15/22 ?1950  ?LIPASE 36  ? ?No results for input(s): AMMONIA in the last 168 hours. ?Diabetic: ?No results for input(s): HGBA1C in the last 72 hours. ?No results for input(s): GLUCAP in the last 168 hours. ? ?Cardiac Enzymes: ?No results for input(s): CKTOTAL, CKMB, CKMBINDEX, TROPONINI in the last 168 hours. ?No results for input(s): PROBNP in the last 8760 hours. ?Coagulation Profile: ?Recent Labs  ?Lab 04/17/22 ?1809 04/18/22 ?0414 04/18/22 ?1427 04/19/22 ?0439 04/19/22 ?1228  ?INR 4.8* 4.3* 3.9* 3.3* 3.1*  ? ?Thyroid Function Tests: ?Recent Labs  ?  04/18/22 ?0414  ?TSH  2.743  ? ?Lipid Profile: ?No results for input(s): CHOL, HDL, LDLCALC, TRIG, CHOLHDL, LDLDIRECT in the last 72 hours. ?Anemia Panel: ?Recent Labs  ?  04/18/22 ?1427  ?VITAMINB12 180  ?FOLATE 5.8*  ?FERRITIN 29

## 2022-04-19 NOTE — Progress Notes (Signed)
?   ? ? ? ? ?[facility] for Infectious Disease ? ?Date of Admission:  04/15/2022    ? ? ?Total days of antibiotics 2  ? Zosyn 4/24 >> current  ? Micafungin 4/27 >> current  ?        ? ?ASSESSMENT: ?Susan Warren is a 46 y.o. female with candidemia in the setting of an enlarging cystic appearing mass in the right abdomen/pelvis measuring 24 x 24 x 24 cm. ?if this if infected given her fevers, leukocytosis and bacteremia. Getting IV vitamin K to see if we can lower INR enough to safely sample the fluid. Would send for cell count/routine cultures.  ?Repeat peripheral blood cultures today now that she has had 2 doses of micafungin. Can continue the zosyn on presumption this is infected cyst.  ?Follow IR for cultures.  ?TTE ordered as well  ? ? ?PLAN: ?Repeat blood cultures now ?Continue mica /zosyn pending IR findings and other micro   ?TTE ordered  ? ? ?Principal Problem: ?  Abdominal pain ?Active Problems: ?  Hypertension ?  GERD (gastroesophageal reflux disease) ?  History of pulmonary embolism ?  Right leg DVT ([place]) ?  Lymphedema of both lower extremities ?  Morbid obesity with body mass index of 70 and over in adult [facility]) ?  Pelvic mass in female ?  Supratherapeutic INR ?  Hyponatremia ?  Normocytic anemia ?  Leukocytosis ?  Candidemia ([place]) ?  Vitamin B12 deficiency ? ? ? (feeding supplement) PROSource Plus  30 mL Oral BID BM  ? amLODipine  10 mg Oral q1800  ? cyanocobalamin  1,000 mcg Intramuscular Daily  ? Followed by  ? [START ON 04/21/2022] vitamin B-12  1,000 mcg Oral Daily  ? pantoprazole (PROTONIX) IV  40 mg Intravenous Q24H  ? ? ?SUBJECTIVE: ?Feeling maybe a little bit better today but still "feels like shit."  ?She has noticed her pain medication (IV) does not last long enough now and hopeful for something that holds her over longer.  ?She is still having some temperature dysregulations/hot flashes but no formal fevers.  ? ? ?Review of Systems: ?Review of Systems  ?Constitutional:  Positive for  diaphoresis. Negative for chills and fever.  ?Eyes:  Negative for blurred vision, double vision and photophobia.  ?Cardiovascular:  Positive for leg swelling. Negative for chest pain.  ?Gastrointestinal:  Positive for abdominal pain. Negative for nausea and vomiting.  ?Skin:  Negative for rash.  ? ? ?Allergies  ?Allergen Reactions  ? Adhesive [Tape] Anaphylaxis and Swelling  ?  *Adhesive Spray*   ? ? ?OBJECTIVE: ?Vitals:  ? 04/18/22 1303 04/18/22 1959 04/19/22 0612 04/19/22 1111  ?BP: 133/68 137/69 (!) 142/88 135/73  ?Pulse: 98 99 (!) 104 95  ?Resp: (!) 22 18 20 18   ?Temp: 98.7 ?F (37.1 ?C) 98.4 ?F (36.9 ?C) 99.6 ?F (37.6 ?C) 98.6 ?F (37 ?C)  ?TempSrc: Oral Oral Oral Oral  ?SpO2: 94% 94% 94% 92%  ?Weight:      ?Height:      ? ?Body mass index is 82.39 kg/m?. ? ? ?Physical Exam ?Constitutional:   ?   Appearance: She is well-developed. She is obese. She is not ill-appearing.  ?Cardiovascular:  ?   Rate and Rhythm: Normal rate and regular rhythm.  ?Abdominal:  ?   General: There is distension.  ?   Tenderness: There is abdominal tenderness in the right upper quadrant and right lower quadrant.  ?Skin: ?   General: Skin is warm and  dry.  ?Neurological:  ?   Mental Status: She is alert and oriented to person, place, and time.  ? ? ?Lab Results ?Lab Results  ?Component Value Date  ? WBC 15.8 (H) 04/19/2022  ? HGB 10.0 (L) 04/19/2022  ? HCT 33.5 (L) 04/19/2022  ? MCV 89.3 04/19/2022  ? PLT 512 (H) 04/19/2022  ?  ?Lab Results  ?Component Value Date  ? CREATININE 0.58 04/19/2022  ? BUN 10 04/19/2022  ? NA 133 (L) 04/19/2022  ? K 4.1 04/19/2022  ? CL 97 (L) 04/19/2022  ? CO2 25 04/19/2022  ?  ?Lab Results  ?Component Value Date  ? ALT 14 04/19/2022  ? AST 26 04/19/2022  ? ALKPHOS 90 04/19/2022  ? BILITOT 0.6 04/19/2022  ?  ? ?Microbiology: ?Recent Results (from the past 240 hour(s))  ?Expectorated Sputum Assessment w Gram Stain, Rflx to Resp Cult     Status: None  ? Collection Time: 04/10/22  4:02 PM  ? Specimen: Expectorated  Sputum  ?Result Value Ref Range Status  ? Specimen Description EXPECTORATED SPUTUM  Final  ? Special Requests NONE  Final  ? Sputum evaluation   Final  ?  Sputum specimen not acceptable for testing.  Please recollect.   ?Gram Stain Report Called to,Read Back By and Verified With:  RN T. PENNINGTON [id number] @1715  FH ?Performed at [facility], [place] [address] [place], [place] [address] ?  ? Report Status 04/10/2022 FINAL  Final  ?Blood culture (routine x 2)     Status: Abnormal (Preliminary result)  ? Collection Time: 04/16/22  3:57 PM  ? Specimen: BLOOD  ?Result Value Ref Range Status  ? Specimen Description   Final  ?  BLOOD BLOOD RIGHT HAND ?Performed at [facility], [place] [address] [place], [place] [address] ?  ? Special Requests   Final  ?  BOTTLES DRAWN AEROBIC ONLY Blood Culture adequate volume ?Performed at [facility], [place] [address], [place], [place] [address] ?  ? Culture  Setup Time (A)  Final  ?  YEAST ?AEROBIC BOTTLE ONLY ?CRITICAL RESULT CALLED TO, READ BACK BY AND VERIFIED WITH: PHARMD M BELL [id number] AT 1326 BY CM ?  ? Culture (A)  Final  ?  CANDIDA PARAPSILOSIS ?CULTURE REINCUBATED FOR BETTER GROWTH ?Performed at [facility], [place] [address], [place], [place] [address] ?  ? Report Status PENDING  Incomplete  ?Blood Culture ID Panel (Reflexed)     Status: Abnormal  ? Collection Time: 04/16/22  3:57 PM  ?Result Value Ref Range Status  ? Enterococcus faecalis NOT DETECTED NOT DETECTED Final  ? Enterococcus Faecium NOT DETECTED NOT DETECTED Final  ? Listeria monocytogenes NOT DETECTED NOT DETECTED Final  ? Staphylococcus species NOT DETECTED NOT DETECTED Final  ? Staphylococcus aureus (BCID) NOT DETECTED NOT DETECTED Final  ? Staphylococcus epidermidis NOT DETECTED NOT DETECTED Final  ? Staphylococcus lugdunensis NOT DETECTED NOT DETECTED Final  ? Streptococcus species NOT DETECTED NOT DETECTED Final  ? Streptococcus agalactiae NOT DETECTED NOT DETECTED  Final  ? Streptococcus pneumoniae NOT DETECTED NOT DETECTED Final  ? Streptococcus pyogenes NOT DETECTED NOT DETECTED Final  ? A.calcoaceticus-baumannii NOT DETECTED NOT DETECTED Final  ? Bacteroides fragilis NOT DETECTED NOT DETECTED Final  ? Enterobacterales NOT DETECTED NOT DETECTED Final  ? Enterobacter cloacae complex NOT DETECTED NOT DETECTED Final  ? Escherichia coli NOT DETECTED NOT DETECTED Final  ? Klebsiella aerogenes NOT DETECTED NOT DETECTED Final  ? Klebsiella  oxytoca NOT DETECTED NOT DETECTED Final  ? Klebsiella pneumoniae NOT DETECTED NOT DETECTED Final  ? Proteus species NOT DETECTED NOT DETECTED Final  ? Salmonella species NOT DETECTED NOT DETECTED Final  ? Serratia marcescens NOT DETECTED NOT DETECTED Final  ? Haemophilus influenzae NOT DETECTED NOT DETECTED Final  ? Neisseria meningitidis NOT DETECTED NOT DETECTED Final  ? Pseudomonas aeruginosa NOT DETECTED NOT DETECTED Final  ? Stenotrophomonas maltophilia NOT DETECTED NOT DETECTED Final  ? Candida albicans NOT DETECTED NOT DETECTED Final  ? Candida auris NOT DETECTED NOT DETECTED Final  ? Candida glabrata NOT DETECTED NOT DETECTED Final  ? Candida krusei NOT DETECTED NOT DETECTED Final  ? Candida parapsilosis DETECTED (A) NOT DETECTED Final  ?  Comment: CRITICAL RESULT CALLED TO, READ BACK BY AND VERIFIED WITH: ?PHARMD M BELL [id number] AT 1326 BY CM ?  ? Candida tropicalis NOT DETECTED NOT DETECTED Final  ? Cryptococcus neoformans/gattii NOT DETECTED NOT DETECTED Final  ?  Comment: Performed at [facility], [address], [place], [place] [address]  ?Blood culture (routine x 2)     Status: None (Preliminary result)  ? Collection Time: 04/16/22  4:14 PM  ? Specimen: BLOOD  ?Result Value Ref Range Status  ? Specimen Description   Final  ?  BLOOD RIGHT ANTECUBITAL ?Performed at [facility], [place] [address], [place], [place] [address] ?  ? Special Requests   Final  ?  BOTTLES DRAWN AEROBIC ONLY Blood Culture adequate  volume ?Performed at [facility], [place] [address] [place], [place] [address] ?  ? Culture   Final  ?  NO GROWTH 3 DAYS ?Performed at [facility], [place] [address] [place], [place]

## 2022-04-19 NOTE — Telephone Encounter (Signed)
Copied from CRM [id number]. Topic: General - Other ?>> Apr 19, 2022  8:26 AM Jaquita Rector A wrote: ?Reason for CRM: Patient mom Jasmine December called in asking to speak to Franky Macho say that its very important to speak to him. Would like a call back today at Ph# [phone] ?

## 2022-04-20 DIAGNOSIS — I10 Essential (primary) hypertension: Secondary | ICD-10-CM | POA: Diagnosis not present

## 2022-04-20 DIAGNOSIS — Z86711 Personal history of pulmonary embolism: Secondary | ICD-10-CM | POA: Diagnosis not present

## 2022-04-20 DIAGNOSIS — K219 Gastro-esophageal reflux disease without esophagitis: Secondary | ICD-10-CM | POA: Diagnosis not present

## 2022-04-20 DIAGNOSIS — R1084 Generalized abdominal pain: Secondary | ICD-10-CM | POA: Diagnosis not present

## 2022-04-20 LAB — PROTIME-INR
INR: 1.5 — ABNORMAL HIGH (ref 0.8–1.2)
Prothrombin Time: 17.6 seconds — ABNORMAL HIGH (ref 11.4–15.2)

## 2022-04-20 LAB — HEPARIN LEVEL (UNFRACTIONATED): Heparin Unfractionated: <0.1 IU/mL — ABNORMAL LOW (ref 0.30–0.70)

## 2022-04-20 MED ORDER — HEPARIN BOLUS VIA INFUSION
4000.0000 [IU] | Freq: Once | INTRAVENOUS | Status: AC
  Administered 2022-04-20: 4000 [IU] via INTRAVENOUS
  Filled 2022-04-20: qty 4000

## 2022-04-20 MED ORDER — OXYCODONE HCL 5 MG PO TABS
5.0000 mg | ORAL_TABLET | Freq: Four times a day (QID) | ORAL | Status: DC | PRN
  Administered 2022-04-20: 5 mg via ORAL
  Filled 2022-04-20 (×2): qty 1

## 2022-04-20 MED ORDER — VALACYCLOVIR HCL 500 MG PO TABS
2000.0000 mg | ORAL_TABLET | Freq: Two times a day (BID) | ORAL | Status: AC
  Administered 2022-04-20 – 2022-04-21 (×2): 2000 mg via ORAL
  Filled 2022-04-20 (×2): qty 4

## 2022-04-20 MED ORDER — HEPARIN BOLUS VIA INFUSION
7500.0000 [IU] | Freq: Once | INTRAVENOUS | Status: AC
  Administered 2022-04-20: 7500 [IU] via INTRAVENOUS
  Filled 2022-04-20: qty 7500

## 2022-04-20 MED ORDER — PANTOPRAZOLE SODIUM 40 MG PO TBEC
40.0000 mg | DELAYED_RELEASE_TABLET | Freq: Every day | ORAL | Status: DC
  Administered 2022-04-21 – 2022-04-29 (×8): 40 mg via ORAL
  Filled 2022-04-20 (×9): qty 1

## 2022-04-20 MED ORDER — HEPARIN (PORCINE) 25000 UT/250ML-% IV SOLN
4500.0000 [IU]/h | INTRAVENOUS | Status: DC
  Administered 2022-04-20: 2050 [IU]/h via INTRAVENOUS
  Administered 2022-04-20: 2450 [IU]/h via INTRAVENOUS
  Administered 2022-04-21: 3900 [IU]/h via INTRAVENOUS
  Administered 2022-04-21: 3100 [IU]/h via INTRAVENOUS
  Administered 2022-04-21: 3900 [IU]/h via INTRAVENOUS
  Administered 2022-04-21: 2750 [IU]/h via INTRAVENOUS
  Filled 2022-04-20 (×6): qty 250

## 2022-04-20 NOTE — Progress Notes (Signed)
?PROGRESS NOTE ? ?Susan Warren GBT:[id number] DOB: 19-Jan-1976  ? ?PCP: Storm Frisk, MD ? ?Patient is from: Home.  Lives with son.  Independently ambulates at baseline ? ?DOA: 04/15/2022 LOS: 3 ? ?Chief complaints ?Chief Complaint  ?Patient presents with  ? Abdominal Pain  ? Nausea  ? Vomiting  ? Diarrhea  ?  ? ?Brief Narrative / Interim history: ?46 year old F with PMH of DVT/PE on warfarin, morbid obesity, lymphedema, anxiety and depression presenting with nausea, vomiting and abdominal pain, and admitted for abdominal pain.  CT abdomen and pelvis showed interval progression of the large cystic mass in the right abdomen and pelvis measuring about 24 x 24 x 24 cm and hepatic cirrhosis but no acute finding.  She had leukocytosis to 17.6.  INR was supratherapeutic.  She was started on IV Zosyn.  IR consulted and planning drainage once INR allows. ? ?Blood culture with Candida parapsilosis in 1 out of 2 bottles.  ID consulted and started micafungin in addition to IV Zosyn.  TTE without significant finding of vegetation.  ID recommending TEE.  Repeat blood culture on 4/28 NGTD. ? ?INR reversed with FFP and vitamin K.  IR planning to drain on 5/1 (Monday). ?  ? ?Subjective: ?Seen and examined earlier this morning.  No major events overnight of this morning.  Continues to endorse significant abdominal pain.  She rates her pain 9/10 although she does not appear to be in that much distress.  She says IV Dilaudid does not last long.  She has bowel movements.  She denies nausea or vomiting. ? ?Objective: ?Vitals:  ? 04/19/22 1111 04/19/22 1935 04/20/22 0427 04/20/22 1238  ?BP: 135/73 (!) 146/72 134/80 (!) 145/65  ?Pulse: 95 98 95 (!) 102  ?Resp: 18 18 19 18   ?Temp: 98.6 ?F (37 ?C) 98.7 ?F (37.1 ?C) 98.4 ?F (36.9 ?C) 98.1 ?F (36.7 ?C)  ?TempSrc: Oral Oral Oral Oral  ?SpO2: 92% 96% 97% 92%  ?Weight:      ?Height:      ? ? ?Examination: ? ?GENERAL: No apparent distress.  Nontoxic. ?HEENT: MMM.  Vision and hearing grossly  intact.  ?NECK: Supple.  No apparent JVD.  ?RESP:  No IWOB.  Fair aeration bilaterally. ?CVS:  RRR. Heart sounds normal.  ?ABD/GI/GU: BS+. Abd slightly firm.  Mild diffuse tenderness.  No rebound or guarding. ?MSK/EXT:  Moves extremities.  Significant lymphedema. ?SKIN: no apparent skin lesion or wound ?NEURO: Awake and alert. Oriented appropriately.  No apparent focal neuro deficit. ?PSYCH: Calm. Normal affect.  ? ?Procedures:  ?None ? ?Microbiology summarized: ?4/25-BC ID and blood culture with Candida Parapsilosis in 1 out of 2 bottles. ?4/28-repeat blood cultures NGTD. ? ? ?Assessment and Plan: ?* Abdominal pain ?Likely due to progressive intra-abdominal and pelvic large cystic mass as noted on CT abdomen and pelvis. BCID with Candidia Parapsilosis.  Still with significant leukocytosis and abdominal pain.  Nausea and vomiting seems to have resolved. ?-INR reversed with FFP and vitamin K. ?-IR to drain abdominal fluid on 5/1.  N.p.o. after midnight on 4/30. ?-Continue IV Zosyn and micafungin per ID ?-Add oxycodone for pain control ?-Continue IV Dilaudid for severe pain. ? ?Candidemia (HCC) ?BCID and blood culture with Candida parpasilosis in 1 out of 2 bottles.  She has no visual change.  Repeat blood cultures NGTD.  TTE without vegetation. ?-ID recommends TEE ?-We will consult cardiology for TEE. ? ? ?Right abdominal and pelvic cystic mass ?CT abdomen and pelvis showed a large cystic mass  in the right abdomen and pelvis measuring 24 x 24 x 24 cm. ?-See abdominal pain ? ?Supratherapeutic INR ?INR 5.9>> 1.5 after FFP x2 and IV vitamin K 5 mg. ?-Continue holding warfarin ?-IV heparin for PE. ? ? ?Leukocytosis ?Intra-abdominal infection?  ?-Continue antibiotic as above ?-Continue monitoring ? ?Normocytic anemia ?Recent Labs  ?  04/07/22 ?2341 04/09/22 ?0133 04/10/22 ?0105 04/11/22 ?0113 04/15/22 ?1950 04/17/22 ?0501 04/18/22 ?[id number] 04/19/22 ?[id number]  ?HGB 10.7* 9.7* 10.6* 9.5* 11.0* 9.7* 9.2* 10.0*  ?Stable.  Anemia  panel consistent with anemia of chronic disease and vitamin B12 deficiency ?-Vitamin B12 1000 mcg IM x2 followed by p.o. ?-Monitor H&H ? ? ?Hyponatremia ?Mild. Monitor ? ?Morbid obesity with body mass index of 70 and over in adult [facility](HCC) ?Body mass index is 82.39 kg/m?Marland Kitchen. Difficult situation. ?-Encourage lifestyle change to lose weight ? ?Right leg DVT (HCC) ?See PE. ? ?History of pulmonary embolism ?On warfarin at home.  ?-Holding home warfarin and bridging with IV heparin pending IR procedure ? ?GERD (gastroesophageal reflux disease) ?Continue PPI ? ?Hypertension ?Normotensive. ?-Continue home meds ? ? ?DVT prophylaxis:  ?Supratherapeutic INR ? ?Code Status: Full code ?Family Communication: Patient and/or RN. Available if any question.  ?Level of care: Med-Surg ?Status is: Inpatient ?The patient will remain inpatient because: Abdominal pain, right abdominal/pelvic cystic mass and candidemia requiring further evaluation and IV antibiotics ? ? ?Final disposition: TBD ? ?Consultants:  ?Interventional radiology ?Infectious disease ? ?Sch Meds:  ?Scheduled Meds: ? (feeding supplement) PROSource Plus  30 mL Oral BID BM  ? amLODipine  10 mg Oral q1800  ? pantoprazole  40 mg Oral Daily  ? [START ON 04/21/2022] vitamin B-12  1,000 mcg Oral Daily  ? ?Continuous Infusions: ? heparin 2,050 Units/hr (04/20/22 1057)  ? micafungin [facility](MYCAMINE) IV 200 mg (04/20/22 1059)  ? ondansetron (ZOFRAN) IV    ? piperacillin-tazobactam (ZOSYN)  IV 3.375 g (04/20/22 [id number])  ? ?PRN Meds:.acetaminophen **OR** acetaminophen, HYDROmorphone (DILAUDID) injection, lip balm, ondansetron (ZOFRAN) IV, oxyCODONE, simethicone ? ?Antimicrobials: ?Anti-infectives (From admission, onward)  ? ? Start     Dose/Rate Route Frequency Ordered Stop  ? 04/18/22 1245  micafungin (MYCAMINE) 200 mg in sodium chloride 0.9 % 100 mL IVPB       ? 200 mg ?110 mL/hr over 1 Hours Intravenous Daily 04/18/22 1152    ? 04/16/22 1200  piperacillin-tazobactam (ZOSYN) IVPB 3.375 g   Status:  Discontinued       ?Note to Pharmacy: High volume of distribution. Please adjust dosage as needed.TY.  ? 3.375 g ?100 mL/hr over 30 Minutes Intravenous Every 6 hours 04/16/22 0859 04/16/22 0902  ? 04/16/22 0915  piperacillin-tazobactam (ZOSYN) IVPB 3.375 g       ? 3.375 g ?12.5 mL/hr over 240 Minutes Intravenous Every 8 hours 04/16/22 0902    ? 04/16/22 0430  piperacillin-tazobactam (ZOSYN) IVPB 3.375 g       ? 3.375 g ?100 mL/hr over 30 Minutes Intravenous  Once 04/16/22 0427 04/16/22 0530  ? ?  ? ? ? ?I have personally reviewed the following labs and images: ?CBC: ?Recent Labs  ?Lab 04/15/22 ?1950 04/17/22 ?0501 04/18/22 ?0414 04/19/22 ?0439  ?WBC 17.6* 15.2* 16.7* 15.8*  ?NEUTROABS 14.4*  --   --  12.0*  ?HGB 11.0* 9.7* 9.2* 10.0*  ?HCT 37.6 31.3* 31.0* 33.5*  ?MCV 88.9 88.2 88.6 89.3  ?PLT 531* 514* 503* 512*  ? ?BMP &GFR ?Recent Labs  ?Lab 04/15/22 ?1950 04/17/22 ?0501 04/18/22 ?0414 04/19/22 ?0439  ?NA 134*  137 133* 133*  ?K 3.7 4.1 4.1 4.1  ?CL 98 101 101 97*  ?CO2 25 26 26 25   ?GLUCOSE 107* 103* 122* 144*  ?BUN 9 12 10 10   ?CREATININE 0.64 0.79 0.67 0.58  ?CALCIUM 8.4* 8.2* 8.0* 8.0*  ?MG  --   --  1.9 1.9  ?PHOS  --   --  3.7 3.2  ? ?Estimated Creatinine Clearance: 162.8 mL/min (by C-G formula based on SCr of 0.58 mg/dL). ?Liver & Pancreas: ?Recent Labs  ?Lab 04/15/22 ?1950 04/17/22 ?0501 04/18/22 ?0414 04/19/22 ?0439  ?AST 27 26  --  26  ?ALT 14 12  --  14  ?ALKPHOS 96 90  --  90  ?BILITOT 0.8 0.8  --  0.6  ?PROT 8.7* 7.5  --  7.6  ?ALBUMIN 2.7* 2.1* 2.1* 2.0*  ? ?Recent Labs  ?Lab 04/15/22 ?1950  ?LIPASE 36  ? ?No results for input(s): AMMONIA in the last 168 hours. ?Diabetic: ?No results for input(s): HGBA1C in the last 72 hours. ?No results for input(s): GLUCAP in the last 168 hours. ? ?Cardiac Enzymes: ?No results for input(s): CKTOTAL, CKMB, CKMBINDEX, TROPONINI in the last 168 hours. ?No results for input(s): PROBNP in the last 8760 hours. ?Coagulation Profile: ?Recent Labs  ?Lab  04/18/22 ?1427 04/19/22 ?0439 04/19/22 ?1228 04/19/22 ?1548 04/20/22 ?0420  ?INR 3.9* 3.3* 3.1* 2.4* 1.5*  ? ?Thyroid Function Tests: ?Recent Labs  ?  04/18/22 ?0414  ?TSH 2.743  ? ?Lipid Profile: ?No results for input(

## 2022-04-20 NOTE — Plan of Care (Signed)
  Problem: Activity: Goal: Risk for activity intolerance will decrease Outcome: Progressing   Problem: Nutrition: Goal: Adequate nutrition will be maintained Outcome: Progressing   Problem: Elimination: Goal: Will not experience complications related to bowel motility Outcome: Progressing Goal: Will not experience complications related to urinary retention Outcome: Progressing   Problem: Pain Managment: Goal: General experience of comfort will improve Outcome: Progressing   

## 2022-04-20 NOTE — Progress Notes (Signed)
ANTICOAGULATION CONSULT NOTE - Follow Up Consult ? ?Pharmacy Consult for Heparin ?Indication: h/o PE ? ?Allergies  ?Allergen Reactions  ? Adhesive [Tape] Anaphylaxis and Swelling  ?  *Adhesive Spray*   ? ? ?Patient Measurements: ?Height: 5\' 4"  (162.6 cm) ?Weight: (!) 217.7 kg (480 lb) ?IBW/kg (Calculated) : 54.7 ?Heparin Dosing Weight:  ? ?Vital Signs: ?Temp: 98.1 ?F (36.7 ?C) (04/29 1238) ?Temp Source: Oral (04/29 1238) ?BP: 145/65 (04/29 1238) ?Pulse Rate: 102 (04/29 1238) ? ?Labs: ?Recent Labs  ?  04/18/22 ?0414 04/18/22 ?1427 04/19/22 ?0439 04/19/22 ?1228 04/19/22 ?1548 04/20/22 ?0420 04/20/22 ?1649  ?HGB 9.2*  --  10.0*  --   --   --   --   ?HCT 31.0*  --  33.5*  --   --   --   --   ?PLT 503*  --  512*  --   --   --   --   ?LABPROT 41.3*   < > 32.9* 31.9* 26.3* 17.6*  --   ?INR 4.3*   < > 3.3* 3.1* 2.4* 1.5*  --   ?HEPARINUNFRC  --   --   --   --   --   --  <0.10*  ?CREATININE 0.67  --  0.58  --   --   --   --   ? < > = values in this interval not displayed.  ? ? ?Estimated Creatinine Clearance: 162.8 mL/min (by C-G formula based on SCr of 0.58 mg/dL). ? ?Assessment: ?AC/Heme: PTA warfarin for hx PE on hold for high INR (5.7 on admit) and possible cyst aspiration (INR must be <2.5)  ?- IV Vit K 5mg  given 4/28 ?- INR down to 1.5, start IV heparin 4/29 ?- Initial Heparin level <0.1. ? ?Goal of Therapy:  ?Heparin level 0.3-0.7 units/ml ?Monitor platelets by anticoagulation protocol: Yes ?  ?Plan:  ?Rebolus heparin 4000 units ?Increase IV heparin to 2450 units/hr ?Recheck heparin level in 6 hrs. ? ? ?Jaiyla Granados S. 5/28, PharmD, BCPS ?Clinical Staff Pharmacist [email or web address] ?5/29, Merilynn Finland ?04/20/2022,5:22 PM ? ? ?

## 2022-04-20 NOTE — Progress Notes (Signed)
ANTICOAGULATION CONSULT NOTE - Initial Consult ? ?Pharmacy Consult for Heparin ?Indication: h/o pulmonary embolus ? ?Allergies  ?Allergen Reactions  ? Adhesive [Tape] Anaphylaxis and Swelling  ?  *Adhesive Spray*   ? ? ?Patient Measurements: ?Height: 5\' 4"  (162.6 cm) ?Weight: (!) 217.7 kg (480 lb) ?IBW/kg (Calculated) : 54.7 ?Heparin Dosing Weight:  113 kg ? ?Vital Signs: ?Temp: 98.4 ?F (36.9 ?C) (04/29 0427) ?Temp Source: Oral (04/29 0427) ?BP: 134/80 (04/29 0427) ?Pulse Rate: 95 (04/29 0427) ? ?Labs: ?Recent Labs  ?  04/18/22 ?0414 04/18/22 ?1427 04/19/22 ?0439 04/19/22 ?1228 04/19/22 ?1548 04/20/22 ?0420  ?HGB 9.2*  --  10.0*  --   --   --   ?HCT 31.0*  --  33.5*  --   --   --   ?PLT 503*  --  512*  --   --   --   ?LABPROT 41.3*   < > 32.9* 31.9* 26.3* 17.6*  ?INR 4.3*   < > 3.3* 3.1* 2.4* 1.5*  ?CREATININE 0.67  --  0.58  --   --   --   ? < > = values in this interval not displayed.  ? ? ?Estimated Creatinine Clearance: 162.8 mL/min (by C-G formula based on SCr of 0.58 mg/dL). ? ? ?Medical History: ?Past Medical History:  ?Diagnosis Date  ? Allergy   ? Anxiety   ? Depression   ? DVT (deep venous thrombosis) (HCC)   ? GERD (gastroesophageal reflux disease)   ? Hepatic cyst   ? Hypertension   ? Lymphedema of both lower extremities   ? Migraines   ? Morbid obesity with BMI of 70 and over, adult [facility])   ? PE (pulmonary thromboembolism) (HCC) 03/2019  ? Sleep apnea   ? ? ?Assessment: ? ?AC/Heme: PTA warfarin for hx PE on hold for high INR (5.7 on admit) and possible cyst aspiration (INR must be <2.5)  ?- IV Vit K 5mg  given 4/28 ?- INR down to 1.5, start IV heparin 4/29 ? ?- PTA warfarin dose: 2.5mg  MF and 5mg  all other days ? ?Goal of Therapy:  ?Heparin level 0.3-0.7 units/ml ?Monitor platelets by anticoagulation protocol: Yes ?  ?Plan:  ?IV heparin 7500 unit IV bolus (35 units/kg) ?Start IV heparin at 2050 units/hr ?Check heparin level in 6 hrs ?Daily HL and CBC ? ? ?Kharma Sampsel S. 5/28, PharmD, BCPS ?Clinical  Staff Pharmacist [email or web address] ? ?5/29, ?04/20/2022,9:18 AM ? ? ?

## 2022-04-20 NOTE — Plan of Care (Signed)

## 2022-04-21 DIAGNOSIS — K219 Gastro-esophageal reflux disease without esophagitis: Secondary | ICD-10-CM | POA: Diagnosis not present

## 2022-04-21 DIAGNOSIS — R1084 Generalized abdominal pain: Secondary | ICD-10-CM | POA: Diagnosis not present

## 2022-04-21 DIAGNOSIS — Z86711 Personal history of pulmonary embolism: Secondary | ICD-10-CM | POA: Diagnosis not present

## 2022-04-21 DIAGNOSIS — B001 Herpesviral vesicular dermatitis: Secondary | ICD-10-CM

## 2022-04-21 DIAGNOSIS — I10 Essential (primary) hypertension: Secondary | ICD-10-CM | POA: Diagnosis not present

## 2022-04-21 LAB — RENAL FUNCTION PANEL
Albumin: 2 g/dL — ABNORMAL LOW (ref 3.5–5.0)
Anion gap: 9 (ref 5–15)
BUN: 8 mg/dL (ref 6–20)
CO2: 29 mmol/L (ref 22–32)
Calcium: 8.2 mg/dL — ABNORMAL LOW (ref 8.9–10.3)
Chloride: 97 mmol/L — ABNORMAL LOW (ref 98–111)
Creatinine, Ser: 0.64 mg/dL (ref 0.44–1.00)
GFR, Estimated: >60 mL/min (ref >60)
Glucose, Bld: 133 mg/dL — ABNORMAL HIGH (ref 70–99)
Phosphorus: 3.4 mg/dL (ref 2.5–4.6)
Potassium: 3.9 mmol/L (ref 3.5–5.1)
Sodium: 135 mmol/L (ref 135–145)

## 2022-04-21 LAB — CULTURE, BLOOD (ROUTINE X 2)
Culture: NO GROWTH
Special Requests: ADEQUATE

## 2022-04-21 LAB — CBC
HCT: 35.5 % — ABNORMAL LOW (ref 36.0–46.0)
Hemoglobin: 10.5 g/dL — ABNORMAL LOW (ref 12.0–15.0)
MCH: 26.7 pg (ref 26.0–34.0)
MCHC: 29.6 g/dL — ABNORMAL LOW (ref 30.0–36.0)
MCV: 90.3 fL (ref 80.0–100.0)
Platelets: 538 10*3/uL — ABNORMAL HIGH (ref 150–400)
RBC: 3.93 MIL/uL (ref 3.87–5.11)
RDW: 15.8 % — ABNORMAL HIGH (ref 11.5–15.5)
WBC: 19.9 10*3/uL — ABNORMAL HIGH (ref 4.0–10.5)
nRBC: 0.5 % — ABNORMAL HIGH (ref 0.0–0.2)

## 2022-04-21 LAB — PROTIME-INR
INR: 1.3 — ABNORMAL HIGH (ref 0.8–1.2)
Prothrombin Time: 16.2 seconds — ABNORMAL HIGH (ref 11.4–15.2)

## 2022-04-21 LAB — HEPARIN LEVEL (UNFRACTIONATED)
Heparin Unfractionated: <0.1 IU/mL — ABNORMAL LOW (ref 0.30–0.70)
Heparin Unfractionated: <0.1 IU/mL — ABNORMAL LOW (ref 0.30–0.70)
Heparin Unfractionated: <0.1 IU/mL — ABNORMAL LOW (ref 0.30–0.70)

## 2022-04-21 LAB — MAGNESIUM: Magnesium: 1.6 mg/dL — ABNORMAL LOW (ref 1.7–2.4)

## 2022-04-21 MED ORDER — HEPARIN BOLUS VIA INFUSION
3500.0000 [IU] | Freq: Once | INTRAVENOUS | Status: AC
  Administered 2022-04-21: 3500 [IU] via INTRAVENOUS
  Filled 2022-04-21: qty 3500

## 2022-04-21 MED ORDER — MAGNESIUM SULFATE 2 GM/50ML IV SOLN
2.0000 g | Freq: Once | INTRAVENOUS | Status: AC
  Administered 2022-04-21: 2 g via INTRAVENOUS
  Filled 2022-04-21: qty 50

## 2022-04-21 MED ORDER — HEPARIN BOLUS VIA INFUSION
4000.0000 [IU] | Freq: Once | INTRAVENOUS | Status: AC
  Administered 2022-04-21: 4000 [IU] via INTRAVENOUS
  Filled 2022-04-21: qty 4000

## 2022-04-21 MED ORDER — HEPARIN BOLUS VIA INFUSION
6500.0000 [IU] | Freq: Once | INTRAVENOUS | Status: AC
  Administered 2022-04-21: 6500 [IU] via INTRAVENOUS
  Filled 2022-04-21: qty 6500

## 2022-04-21 MED ORDER — SENNOSIDES-DOCUSATE SODIUM 8.6-50 MG PO TABS
1.0000 | ORAL_TABLET | Freq: Every day | ORAL | Status: DC
  Administered 2022-04-21 – 2022-04-29 (×7): 1 via ORAL
  Filled 2022-04-21 (×7): qty 1

## 2022-04-21 NOTE — Progress Notes (Signed)
?   04/21/22 0415  ?Assess: MEWS Score  ?Temp 99.4 ?F (37.4 ?C)  ?BP 131/70  ?Pulse Rate (!) 106  ?Resp (!) 24  ?SpO2 91 %  ?Assess: MEWS Score  ?MEWS Temp 0  ?MEWS Systolic 0  ?MEWS Pulse 1  ?MEWS RR 1  ?MEWS LOC 0  ?MEWS Score 2  ?MEWS Score Color Yellow  ?Assess: if the MEWS score is Yellow or Red  ?Were vital signs taken at a resting state? Yes  ?Focused Assessment No change from prior assessment  ?Does the patient meet 2 or more of the SIRS criteria? Yes  ?Does the patient have a confirmed or suspected source of infection? Yes  ?Provider and Rapid Response Notified? No  ?Early Detection of Sepsis Score *See Row Information* Medium  ?MEWS guidelines implemented *See Row Information* Yes  ?Treat  ?MEWS Interventions Escalated (See documentation below)  ?Pain Scale 0-10  ?Pain Score 7  ?Pain Type Acute pain  ?Pain Location Abdomen  ?Pain Orientation Right  ?Take Vital Signs  ?Increase Vital Sign Frequency  Yellow: Q 2hr X 2 then Q 4hr X 2, if remains yellow, continue Q 4hrs  ?Escalate  ?MEWS: Escalate Yellow: discuss with charge nurse/RN and consider discussing with provider and RRT  ?Notify: Charge Nurse/RN  ?Name of Charge Nurse/RN Notified Kristine, RN  ?Date Charge Nurse/RN Notified 04/21/22  ?Time Charge Nurse/RN Notified 0430  ?Notify: Provider  ?Provider Name/Title Lennox Grumbles, NP  ?Date Provider Notified 04/21/22  ?Time Provider Notified [phone]  ?Notification Type  ?(Secure chat)  ?Notification Reason Other (Comment) ?(Yellow mews due to RR 24 and HR of 106.  Her temp was 99.4. turned the room temp down and removed some blankets.)  ?Provider response No new orders  ?Date of Provider Response 04/21/22  ?Time of Provider Response 0500  ?Document  ?Patient Outcome Stabilized after interventions  ?Progress note created (see row info) Yes  ?Assess: SIRS CRITERIA  ?SIRS Temperature  0  ?SIRS Pulse 1  ?SIRS Respirations  1  ?SIRS WBC 0  ?SIRS Score Sum  2  ? ? ?

## 2022-04-21 NOTE — Progress Notes (Signed)
? ? ?  [facility] has been requested to perform a transesophageal echocardiogram on 04/22/2022 for bacteremia she.  After careful review of history and examination, the risks and benefits of transesophageal echocardiogram have been explained including risks of esophageal damage, perforation (1:10,000 risk), bleeding, pharyngeal hematoma as well as other potential complications associated with conscious sedation including aspiration, arrhythmia, respiratory failure and death. Alternatives to treatment were discussed, questions were answered. Patient is willing to proceed.  ? ?TEE scheduled for 05/12/2022 with Dr. Mayford Knife, timing TBD. ? ?Judy Pimple, PA-C ?04/21/2022 12:48 PM  ? ?

## 2022-04-21 NOTE — Progress Notes (Signed)
?PROGRESS NOTE ? ?Susan Warren [id number] DOB: 1976-08-13  ? ?PCP: Elsie Stain, MD ? ?Patient is from: Home.  Lives with son.  Independently ambulates at baseline ? ?DOA: 04/15/2022 LOS: 4 ? ?Chief complaints ?Chief Complaint  ?Patient presents with  ? Abdominal Pain  ? Nausea  ? Vomiting  ? Diarrhea  ?  ? ?Brief Narrative / Interim history: ?46 year old F with PMH of DVT/PE on warfarin, morbid obesity, lymphedema, anxiety and depression presenting with nausea, vomiting and abdominal pain, and admitted for abdominal pain.  CT abdomen and pelvis showed interval progression of the large cystic mass in the right abdomen and pelvis measuring about 24 x 24 x 24 cm and hepatic cirrhosis but no acute finding.  She had leukocytosis to 17.6.  INR was supratherapeutic.  She was started on IV Zosyn.  IR consulted and planning drainage once INR allows. ? ?Blood culture with Candida parapsilosis in 1 out of 2 bottles.  ID consulted and started micafungin in addition to IV Zosyn.  TTE without significant finding of vegetation.  ID recommending TEE.  Repeat blood culture on 4/28 NGTD. ? ?INR reversed with FFP and vitamin K.  IR planning to drain on 5/1 (Monday). ?  ? ?Subjective: ?Seen and examined earlier this morning.  No major events overnight of this morning.  Cold sore improved.  Still with significant abdominal pressure.  She denies nausea or vomiting. ? ?Objective: ?Vitals:  ? 04/21/22 0415 04/21/22 0600 04/21/22 0813 04/21/22 1239  ?BP: 131/70 (!) 150/72 (!) 142/71 136/69  ?Pulse: (!) 106 (!) 108 (!) 102 100  ?Resp: (!) 24 (!) 22 20 (!) 22  ?Temp: 99.4 ?F (37.4 ?C) 98 ?F (36.7 ?C) 99.5 ?F (37.5 ?C) 98.3 ?F (36.8 ?C)  ?TempSrc: Oral Oral Oral Oral  ?SpO2: 91% 92% 95% 90%  ?Weight:      ?Height:      ? ? ?Examination: ? ?GENERAL: No apparent distress.  Nontoxic. ?HEENT: MMM.  Vision and hearing grossly intact.  Small blister on the right upper lip laterally ?NECK: Supple.  No apparent JVD.  ?RESP:  No IWOB.  Fair  aeration bilaterally. ?CVS:  RRR. Heart sounds normal.  ?ABD/GI/GU: BS+.  Abdomen is slightly firm.  Mild diffuse tenderness.  No rebound or guarding.  ?MSK/EXT:  Moves extremities.  Significant lymphedema in both legs. ?SKIN: Chronic skin changes in lower extremity due to lymphedema ?NEURO: Awake and alert. Oriented appropriately.  No apparent focal neuro deficit. ?PSYCH: Calm. Normal affect.  ? ?Procedures:  ?None ? ?Microbiology summarized: ?4/25-BC ID and blood culture with Candida Parapsilosis in 1 out of 2 bottles. ?4/28-repeat blood cultures NGTD. ? ? ?Assessment and Plan: ?* Abdominal pain ?Likely due to progressive intra-abdominal and pelvic large cystic mass as noted on CT abdomen and pelvis. BCID with Candidia Parapsilosis.  Still with significant leukocytosis and abdominal pain.  Nausea and vomiting seems to have resolved. ?-INR reversed with FFP and vitamin K. ?-IR to drain abdominal fluid on 5/1.  N.p.o. after midnight ?-Continue IV Zosyn and micafungin per ID ?-Tylenol, oxycodone and IV Dilaudid as needed for pain based on severity ?-Schedule Senokot-S daily for bowel regimen ? ?Candidemia ([place]) ?BCID and blood culture with Candida parpasilosis in 1 out of 2 bottles.  She has no visual change.  Repeat blood cultures NGTD.  TTE without vegetation. ?-ID recommends TEE ?-Cardiology consulted for TEE ? ? ?Right abdominal and pelvic cystic mass ?CT a/p showed a large cystic mass in the right abdomen and  pelvis measuring 24 x 24 x 24 cm. ?-Check CA 125 ?-See abdominal pain above for further management ? ?Supratherapeutic INR ?INR 5.9>> 1.3 after FFP x2 and IV vitamin K 5 mg. ?-Continue holding warfarin ?-Bridging with IV heparin pending procedures ? ? ?Leukocytosis ?Intra-abdominal infection?  Seems to be worse today ?-Continue antibiotic as above ?-Continue monitoring ? ?Normocytic anemia ?Recent Labs  ?  04/07/22 ?2341 04/09/22 ?0133 04/10/22 ?0105 04/11/22 ?0113 04/15/22 ?1950 04/17/22 ?0501  04/18/22 ?7824 04/19/22 ?2353 04/21/22 ?6144  ?HGB 10.7* 9.7* 10.6* 9.5* 11.0* 9.7* 9.2* 10.0* 10.5*  ?Stable.  Anemia panel consistent with anemia of chronic disease and vitamin B12 deficiency ?-Vitamin B12 1000 mcg IM x2 followed by p.o. ?-Monitor H&H ? ? ?Hyponatremia ?Improved.  Monitor. ? ?Morbid obesity with body mass index of 70 and over in adult [facility]) ?Body mass index is 82.39 kg/m?Marland Kitchen Difficult situation. ?-Encourage lifestyle change to lose weight ? ?Right leg DVT (HCC) ?See PE. ? ?History of pulmonary embolism ?On warfarin at home.  ?-Holding home warfarin and bridging with IV heparin pending IR procedure ? ?Hypomagnesemia ?Mg 1.6. ?-IV magnesium sulfate 2 g x 1 ? ?Herpes simplex labialis ?Blisters right upper lip.  Reports history of cold sore. ?-Received p.o. Valtrex 2 g x 2 ? ?GERD (gastroesophageal reflux disease) ?Continue PPI ? ?Hypertension ?Normotensive. ?-Continue home meds ? ? ?DVT prophylaxis:  ?Supratherapeutic INR ? ?Code Status: Full code ?Family Communication: Patient and/or RN. Available if any question.  ?Level of care: Med-Surg ?Status is: Inpatient ?The patient will remain inpatient because: Abdominal pain, right abdominal/pelvic cystic mass and candidemia requiring further evaluation and IV antibiotics ? ? ?Final disposition: TBD ? ?Consultants:  ?Interventional radiology ?Infectious disease ?Cardiology ? ?Sch Meds:  ?Scheduled Meds: ? (feeding supplement) PROSource Plus  30 mL Oral BID BM  ? amLODipine  10 mg Oral q1800  ? pantoprazole  40 mg Oral Daily  ? senna-docusate  1 tablet Oral Daily  ? vitamin B-12  1,000 mcg Oral Daily  ? ?Continuous Infusions: ? heparin 3,100 Units/hr (04/21/22 1101)  ? micafungin [facility]) IV 200 mg (04/21/22 1035)  ? ondansetron (ZOFRAN) IV    ? piperacillin-tazobactam (ZOSYN)  IV 3.375 g (04/21/22 0603)  ? ?PRN Meds:.acetaminophen **OR** acetaminophen, HYDROmorphone (DILAUDID) injection, lip balm, ondansetron (ZOFRAN) IV, oxyCODONE,  simethicone ? ?Antimicrobials: ?Anti-infectives (From admission, onward)  ? ? Start     Dose/Rate Route Frequency Ordered Stop  ? 04/20/22 1830  valACYclovir (VALTREX) tablet 2,000 mg       ? 2,000 mg Oral 2 times daily 04/20/22 1741 04/21/22 0909  ? 04/18/22 1245  micafungin (MYCAMINE) 200 mg in sodium chloride 0.9 % 100 mL IVPB       ? 200 mg ?110 mL/hr over 1 Hours Intravenous Daily 04/18/22 1152    ? 04/16/22 1200  piperacillin-tazobactam (ZOSYN) IVPB 3.375 g  Status:  Discontinued       ?Note to Pharmacy: High volume of distribution. Please adjust dosage as needed.TY.  ? 3.375 g ?100 mL/hr over 30 Minutes Intravenous Every 6 hours 04/16/22 0859 04/16/22 0902  ? 04/16/22 0915  piperacillin-tazobactam (ZOSYN) IVPB 3.375 g       ? 3.375 g ?12.5 mL/hr over 240 Minutes Intravenous Every 8 hours 04/16/22 0902    ? 04/16/22 0430  piperacillin-tazobactam (ZOSYN) IVPB 3.375 g       ? 3.375 g ?100 mL/hr over 30 Minutes Intravenous  Once 04/16/22 0427 04/16/22 0530  ? ?  ? ? ? ?I have personally  reviewed the following labs and images: ?CBC: ?Recent Labs  ?Lab 04/15/22 ?1950 04/17/22 ?0501 04/18/22 ?[id number] 04/19/22 ?[id number] 04/21/22 ?[id number]  ?WBC 17.6* 15.2* 16.7* 15.8* 19.9*  ?NEUTROABS 14.4*  --   --  12.0*  --   ?HGB 11.0* 9.7* 9.2* 10.0* 10.5*  ?HCT 37.6 31.3* 31.0* 33.5* 35.5*  ?MCV 88.9 88.2 88.6 89.3 90.3  ?PLT 531* 514* 503* 512* 538*  ? ?BMP &GFR ?Recent Labs  ?Lab 04/15/22 ?1950 04/17/22 ?0501 04/18/22 ?[id number] 04/19/22 ?[id number] 04/21/22 ?[id number]  ?NA 134* 137 133* 133* 135  ?K 3.7 4.1 4.1 4.1 3.9  ?CL 98 101 101 97* 97*  ?CO2 25 26 26 25 29   ?GLUCOSE 107* 103* 122* 144* 133*  ?BUN 9 12 10 10 8   ?CREATININE 0.64 0.79 0.67 0.58 0.64  ?CALCIUM 8.4* 8.2* 8.0* 8.0* 8.2*  ?MG  --   --  1.9 1.9 1.6*  ?PHOS  --   --  3.7 3.2 3.4  ? ?Estimated Creatinine Clearance: 162.8 mL/min (by C-G formula based on SCr of 0.64 mg/dL). ?Liver & Pancreas: ?Recent Labs  ?Lab 04/15/22 ?1950 04/17/22 ?0501 04/18/22 ?[id number] 04/19/22 ?[id number] 04/21/22 ?[id number]   ?AST 27 26  --  26  --   ?ALT 14 12  --  14  --   ?ALKPHOS 96 90  --  90  --   ?BILITOT 0.8 0.8  --  0.6  --   ?PROT 8.7* 7.5  --  7.6  --   ?ALBUMIN 2.7* 2.1* 2.1* 2.0* 2.0*  ? ?Recent Labs  ?Lab 04/15/22 ?1950  ?LIPASE 36  ? ?No result

## 2022-04-21 NOTE — Plan of Care (Signed)

## 2022-04-21 NOTE — Assessment & Plan Note (Addendum)
Mg 1.7. ?-IV magnesium sulfate 2 g x 1 ?

## 2022-04-21 NOTE — Progress Notes (Signed)
ANTICOAGULATION CONSULT NOTE - Follow Up Consult ? ?Pharmacy Consult for Heparin ?Indication: h/o PE ? ?Allergies  ?Allergen Reactions  ? Adhesive [Tape] Anaphylaxis and Swelling  ?  *Adhesive Spray*   ? ? ?Patient Measurements: ?Height: 5\' 4"  (162.6 cm) ?Weight: (!) 217.7 kg (480 lb) ?IBW/kg (Calculated) : 54.7 ?Heparin Dosing Weight: 113 kg ? ?Vital Signs: ?Temp: 98.6 ?F (37 ?C) (04/29 1953) ?Temp Source: Oral (04/29 1953) ?BP: 131/78 (04/29 1953) ?Pulse Rate: 103 (04/29 1953) ? ?Labs: ?Recent Labs  ?  04/18/22 ?0414 04/18/22 ?1427 04/19/22 ?0439 04/19/22 ?1228 04/19/22 ?1548 04/20/22 ?0420 04/20/22 ?1649 04/20/22 ?2337  ?HGB 9.2*  --  10.0*  --   --   --   --   --   ?HCT 31.0*  --  33.5*  --   --   --   --   --   ?PLT 503*  --  512*  --   --   --   --   --   ?LABPROT 41.3*   < > 32.9* 31.9* 26.3* 17.6*  --   --   ?INR 4.3*   < > 3.3* 3.1* 2.4* 1.5*  --   --   ?HEPARINUNFRC  --   --   --   --   --   --  <0.10* <0.10*  ?CREATININE 0.67  --  0.58  --   --   --   --   --   ? < > = values in this interval not displayed.  ? ? ? ?Estimated Creatinine Clearance: 162.8 mL/min (by C-G formula based on SCr of 0.58 mg/dL). ? ?Assessment: ?AC/Heme: PTA warfarin for hx PE on hold for high INR (5.7 on admit) and possible cyst aspiration (INR must be <2.5)  ?- IV Vit K 5mg  given 4/28 ?- INR down to 1.5, start IV heparin 4/29 ? ?04/21/22 ?Heparin level < 0.1 despite rebolus and increased heparin rate to 2450 units/hr ?RN confirmed no interruption in therapy and no line issues ? ?Goal of Therapy:  ?Heparin level 0.3-0.7 units/ml ?Monitor platelets by anticoagulation protocol: Yes ?  ?Plan:  ?Rebolus heparin 3500 units ?Increase IV heparin to 2750 units/hr ?Recheck heparin level in 6 hrs. ?Daily CBC and heparin level ? ?5/29, PharmD ?04/21/2022,12:33 AM ? ? ?

## 2022-04-21 NOTE — Progress Notes (Signed)
ANTICOAGULATION CONSULT NOTE - Follow Up Consult ? ?Pharmacy Consult for Heparin ?Indication: h/o PE ? ?Allergies  ?Allergen Reactions  ? Adhesive [Tape] Anaphylaxis and Swelling  ?  *Adhesive Spray*   ? ? ?Patient Measurements: ?Height: 5\' 4"  (162.6 cm) ?Weight: (!) 217.7 kg (480 lb) ?IBW/kg (Calculated) : 54.7 ?Heparin Dosing Weight: 113 kg ? ?Vital Signs: ?Temp: 98.3 ?F (36.8 ?C) (04/30 1617) ?Temp Source: Oral (04/30 1617) ?BP: 144/72 (04/30 1617) ?Pulse Rate: 99 (04/30 1617) ? ?Labs: ?Recent Labs  ?  04/19/22 ?0439 04/19/22 ?1228 04/19/22 ?1548 04/20/22 ?0420 04/20/22 ?1649 04/20/22 ?2337 04/21/22 ?04/23/22 04/21/22 ?1559  ?HGB 10.0*  --   --   --   --   --  10.5*  --   ?HCT 33.5*  --   --   --   --   --  35.5*  --   ?PLT 512*  --   --   --   --   --  538*  --   ?LABPROT 32.9*   < > 26.3* 17.6*  --   --  16.2*  --   ?INR 3.3*   < > 2.4* 1.5*  --   --  1.3*  --   ?HEPARINUNFRC  --   --   --   --    < > <0.10* <0.10* <0.10*  ?CREATININE 0.58  --   --   --   --   --  0.64  --   ? < > = values in this interval not displayed.  ? ? ? ?Estimated Creatinine Clearance: 162.8 mL/min (by C-G formula based on SCr of 0.64 mg/dL). ? ?Assessment: ?AC/Heme: PTA warfarin for hx PE on hold for high INR (5.7 on admit) and possible cyst aspiration (INR must be <2.5)  ?- IV Vit K 5mg  given 4/28 ?- Start IV heparin 4/29 ?- Heparin level remains  <0.1. INR 1.3, Hgb 10.5 stable. Plts 500s ? ?Goal of Therapy:  ?Heparin level 0.3-0.7 units/ml ?Monitor platelets by anticoagulation protocol: Yes ?  ?Plan:  ?Rebolus heparin 6500 units (30 units/kg TBW) ?Increase IV heparin to 3900 units/hr (18 units/kg/hr TBW) ?Will recheck in 6 hrs. IF we don't get some kind of heparin level movement after this increase in dose, may need to consider Lovenox and checking LMWH levels. ?Daily HL and CBC ?TEE 5/1 ? ? ?Susan Yohn S. 5/28, PharmD, BCPS ?Clinical Staff Pharmacist ?[email or web address] ?5/29, Susan Warren ?04/21/2022,5:03 PM ? ? ?

## 2022-04-21 NOTE — Progress Notes (Signed)
ANTICOAGULATION CONSULT NOTE - Follow Up Consult ? ?Pharmacy Consult for Heparin ?Indication: h/o PE ? ?Allergies  ?Allergen Reactions  ? Adhesive [Tape] Anaphylaxis and Swelling  ?  *Adhesive Spray*   ? ? ?Patient Measurements: ?Height: 5\' 4"  (162.6 cm) ?Weight: (!) 217.7 kg (480 lb) ?IBW/kg (Calculated) : 54.7 ?Heparin Dosing Weight: 113 kg ? ?Vital Signs: ?Temp: 98 ?F (36.7 ?C) (04/30 0600) ?Temp Source: Oral (04/30 0600) ?BP: 150/72 (04/30 0600) ?Pulse Rate: 108 (04/30 0600) ? ?Labs: ?Recent Labs  ?  04/19/22 ?0439 04/19/22 ?1228 04/19/22 ?1548 04/20/22 ?0420 04/20/22 ?1649 04/20/22 ?2337 04/21/22 ?04/23/22  ?HGB 10.0*  --   --   --   --   --  10.5*  ?HCT 33.5*  --   --   --   --   --  35.5*  ?PLT 512*  --   --   --   --   --  538*  ?LABPROT 32.9*   < > 26.3* 17.6*  --   --  16.2*  ?INR 3.3*   < > 2.4* 1.5*  --   --  1.3*  ?HEPARINUNFRC  --   --   --   --  <0.10* <0.10* <0.10*  ?CREATININE 0.58  --   --   --   --   --   --   ? < > = values in this interval not displayed.  ? ? ? ?Estimated Creatinine Clearance: 162.8 mL/min (by C-G formula based on SCr of 0.58 mg/dL). ? ?Assessment: ?AC/Heme: PTA warfarin for hx PE on hold for high INR (5.7 on admit) and possible cyst aspiration (INR must be <2.5)  ?- IV Vit K 5mg  given 4/28 ?- Start IV heparin 4/29 ?- Heparin level remains  <0.1. INR 1.3, Hgb 10.5 stable. Plts 500s ? ?Goal of Therapy:  ?Heparin level 0.3-0.7 units/ml ?Monitor platelets by anticoagulation protocol: Yes ?  ?Plan:  ?Rebolus heparin 4000 units ?Increase IV heparin to 3100 units/hr ?Will recheck in 6 hrs. ?Daily HL and CBC ? ? ?Susan Lafon S. 5/28, PharmD, BCPS ?Clinical Staff Pharmacist ?[email or web address] ?5/29, Merilynn Finland ?04/21/2022,8:11 AM ? ? ?

## 2022-04-21 NOTE — Assessment & Plan Note (Addendum)
Blisters right upper lip.  Reports history of cold sore. ?-Received p.o. Valtrex 2 g x 2 ?

## 2022-04-22 ENCOUNTER — Inpatient Hospital Stay (HOSPITAL_COMMUNITY): Payer: 59

## 2022-04-22 DIAGNOSIS — R1084 Generalized abdominal pain: Secondary | ICD-10-CM | POA: Diagnosis not present

## 2022-04-22 DIAGNOSIS — K219 Gastro-esophageal reflux disease without esophagitis: Secondary | ICD-10-CM | POA: Diagnosis not present

## 2022-04-22 DIAGNOSIS — Z86711 Personal history of pulmonary embolism: Secondary | ICD-10-CM | POA: Diagnosis not present

## 2022-04-22 DIAGNOSIS — I10 Essential (primary) hypertension: Secondary | ICD-10-CM | POA: Diagnosis not present

## 2022-04-22 LAB — CBC
HCT: 33.9 % — ABNORMAL LOW (ref 36.0–46.0)
Hemoglobin: 10.1 g/dL — ABNORMAL LOW (ref 12.0–15.0)
MCH: 26.5 pg (ref 26.0–34.0)
MCHC: 29.8 g/dL — ABNORMAL LOW (ref 30.0–36.0)
MCV: 89 fL (ref 80.0–100.0)
Platelets: 483 10*3/uL — ABNORMAL HIGH (ref 150–400)
RBC: 3.81 MIL/uL — ABNORMAL LOW (ref 3.87–5.11)
RDW: 15.9 % — ABNORMAL HIGH (ref 11.5–15.5)
WBC: 19 10*3/uL — ABNORMAL HIGH (ref 4.0–10.5)
nRBC: 0.2 % (ref 0.0–0.2)

## 2022-04-22 LAB — COMPREHENSIVE METABOLIC PANEL
ALT: 9 U/L (ref 0–44)
AST: 15 U/L (ref 15–41)
Albumin: 1.9 g/dL — ABNORMAL LOW (ref 3.5–5.0)
Alkaline Phosphatase: 90 U/L (ref 38–126)
Anion gap: 6 (ref 5–15)
BUN: 8 mg/dL (ref 6–20)
CO2: 30 mmol/L (ref 22–32)
Calcium: 8.2 mg/dL — ABNORMAL LOW (ref 8.9–10.3)
Chloride: 98 mmol/L (ref 98–111)
Creatinine, Ser: 0.62 mg/dL (ref 0.44–1.00)
GFR, Estimated: >60 mL/min (ref >60)
Glucose, Bld: 155 mg/dL — ABNORMAL HIGH (ref 70–99)
Potassium: 4.3 mmol/L (ref 3.5–5.1)
Sodium: 134 mmol/L — ABNORMAL LOW (ref 135–145)
Total Bilirubin: 0.8 mg/dL (ref 0.3–1.2)
Total Protein: 7.4 g/dL (ref 6.5–8.1)

## 2022-04-22 LAB — MAGNESIUM: Magnesium: 1.8 mg/dL (ref 1.7–2.4)

## 2022-04-22 LAB — CA 125: Cancer Antigen (CA) 125: 65.5 U/mL — ABNORMAL HIGH (ref 0.0–38.1)

## 2022-04-22 LAB — HEPARIN LEVEL (UNFRACTIONATED): Heparin Unfractionated: 0.14 IU/mL — ABNORMAL LOW (ref 0.30–0.70)

## 2022-04-22 LAB — PROTIME-INR
INR: 1.4 — ABNORMAL HIGH (ref 0.8–1.2)
Prothrombin Time: 17.1 seconds — ABNORMAL HIGH (ref 11.4–15.2)

## 2022-04-22 LAB — PHOSPHORUS: Phosphorus: 3.3 mg/dL (ref 2.5–4.6)

## 2022-04-22 MED ORDER — CHLORHEXIDINE GLUCONATE 0.12 % MT SOLN
15.0000 mL | Freq: Two times a day (BID) | OROMUCOSAL | Status: DC
  Administered 2022-04-22 – 2022-04-29 (×15): 15 mL via OROMUCOSAL
  Filled 2022-04-22 (×15): qty 15

## 2022-04-22 MED ORDER — LIDOCAINE HCL 1 % IJ SOLN
INTRAMUSCULAR | Status: AC
  Administered 2022-04-22: 10 mL
  Filled 2022-04-22: qty 20

## 2022-04-22 MED ORDER — HEPARIN BOLUS VIA INFUSION
4000.0000 [IU] | Freq: Once | INTRAVENOUS | Status: AC
  Administered 2022-04-22: 4000 [IU] via INTRAVENOUS
  Filled 2022-04-22: qty 4000

## 2022-04-22 MED ORDER — FENTANYL CITRATE (PF) 100 MCG/2ML IJ SOLN
INTRAMUSCULAR | Status: AC
  Filled 2022-04-22: qty 2

## 2022-04-22 MED ORDER — SODIUM CHLORIDE 0.9 % IV SOLN: INTRAVENOUS | Status: DC

## 2022-04-22 MED ORDER — MIDAZOLAM HCL 2 MG/2ML IJ SOLN
INTRAMUSCULAR | Status: AC
  Filled 2022-04-22: qty 2

## 2022-04-22 MED ORDER — FENTANYL CITRATE (PF) 100 MCG/2ML IJ SOLN
INTRAMUSCULAR | Status: AC | PRN
  Administered 2022-04-22 (×2): 50 ug via INTRAVENOUS

## 2022-04-22 MED ORDER — MIDAZOLAM HCL 2 MG/2ML IJ SOLN
INTRAMUSCULAR | Status: AC | PRN
Start: 2022-04-22 — End: 2022-04-22
  Administered 2022-04-22 (×2): 1 mg via INTRAVENOUS

## 2022-04-22 MED ORDER — ENOXAPARIN SODIUM 300 MG/3ML IJ SOLN
220.0000 mg | Freq: Two times a day (BID) | INTRAMUSCULAR | Status: DC
  Administered 2022-04-22 – 2022-04-29 (×15): 220 mg via SUBCUTANEOUS
  Filled 2022-04-22 (×19): qty 2.2

## 2022-04-22 MED ORDER — ORAL CARE MOUTH RINSE
15.0000 mL | Freq: Two times a day (BID) | OROMUCOSAL | Status: DC
  Administered 2022-04-22 – 2022-04-28 (×7): 15 mL via OROMUCOSAL

## 2022-04-22 NOTE — Progress Notes (Signed)
Patient returned to room after procedure in IR.  Denies N/V, c/o pain 6/10.  Pain medication administered.  Q4 VS checks initiated. ? ?Per MD order, patient's diet advanced to heart healthy, then NPO beginning at midnight 5/2 prior to TEE tomorrow. ? ?Bradd Burner, RN  ?

## 2022-04-22 NOTE — H&P (View-Only) (Signed)
?  [facility]   ? ?Date of Admission:  04/15/2022   Total days of antibiotics 8 ?       Day 5 micafungin ?        ? ?ID: Susan Warren is a 46 y.o. female with  fevers, leukocytosis from large abdominal/pelvic abscess complicated by candidemia.  ?Principal Problem: ?  Abdominal pain due to right abdominal and pelvic cystic mass ?Active Problems: ?  Hypertension ?  GERD (gastroesophageal reflux disease) ?  History of pulmonary embolism ?  Right leg DVT (HCC) ?  Lymphedema of both lower extremities ?  Morbid obesity with body mass index of 70 and over in adult (HCC) ?  Right abdominal and pelvic cystic mass ?  Supratherapeutic INR ?  Hyponatremia ?  Normocytic anemia ?  Leukocytosis ?  Candidemia (HCC) ?  Vitamin B12 deficiency ?  Herpes simplex labialis ?  Hypomagnesemia ? ? ? ?Subjective: ?Chills at night but today had 7.2L of intra-abdominal fluid aspirated by IR- ultrasound. Suction aspirationuilded 7.2L of brown debris-filled cystic fluid. She feels like she has less abdominal pressure ? ?Medications:  ? (feeding supplement) PROSource Plus  30 mL Oral BID BM  ? amLODipine  10 mg Oral q1800  ? chlorhexidine  15 mL Mouth Rinse BID  ? enoxaparin (LOVENOX) injection  220 mg Subcutaneous Q12H  ? mouth rinse  15 mL Mouth Rinse q12n4p  ? pantoprazole  40 mg Oral Daily  ? senna-docusate  1 tablet Oral Daily  ? vitamin B-12  1,000 mcg Oral Daily  ? ? ?Objective: ?Vital signs in last 24 hours: ?Temp:  [98.4 ?F (36.9 ?C)-99.5 ?F (37.5 ?C)] 98.4 ?F (36.9 ?C) (05/01 1439) ?Pulse Rate:  [93-101] 93 (05/01 1439) ?Resp:  [16-24] 16 (05/01 1439) ?BP: (118-158)/(60-76) 144/67 (05/01 1439) ?SpO2:  [87 %-96 %] 95 % (05/01 1525) ? ?Physical Exam  ?Constitutional:  oriented to person, place, and time. appears well-developed and well-nourished. No distress.  ?HENT: [place]/AT, PERRLA, no scleral icterus ?Mouth/Throat: Oropharynx is clear and moist. No oropharyngeal exudate.  ?Cardiovascular: Normal rate, regular  rhythm and normal heart sounds. Exam reveals no gallop and no friction rub.  ?No murmur heard.  ?Pulmonary/Chest: Effort normal and breath sounds normal. No respiratory distress.  has no wheezes.  ?Neck = supple, no nuchal rigidity ?Abdominal: Soft. Bowel sounds are normal.  exhibits no distension. There is no tenderness. Large pannus ?Lymphadenopathy: no cervical adenopathy. No axillary adenopathy ?Neurological: alert and oriented to person, place, and time.  ?Skin: Skin is warm and dry. No rash noted. No erythema.  ?Ext: lymphadema changes ?Psychiatric: a normal mood and affect.  behavior is normal.  ? ? ?Lab Results ?Recent Labs  ?  04/21/22 ?0718 04/22/22 ?0025  ?WBC 19.9* 19.0*  ?HGB 10.5* 10.1*  ?HCT 35.5* 33.9*  ?NA 135 134*  ?K 3.9 4.3  ?CL 97* 98  ?CO2 29 30  ?BUN 8 8  ?CREATININE 0.64 0.62  ? ?Liver Panel ?Recent Labs  ?  04/21/22 ?0718 04/22/22 ?0025  ?PROT  --  7.4  ?ALBUMIN 2.0* 1.9*  ?AST  --  15  ?ALT  --  9  ?ALKPHOS  --  90  ?BILITOT  --  0.8  ? ?Sedimentation Rate ?No results for input(s): ESRSEDRATE in the last 72 hours. ?C-Reactive Protein ?No results for input(s): CRP in the last 72 hours. ? ?Microbiology: ?reviewed ?Studies/Results: ?US Abscess Drain ? ?Result Date: 04/22/2022 ?INDICATION: Chronic very large cystic abdominal mass   presumed to be a exophytic liver cyst EXAM: ULTRASOUND ASPIRATION OF THE LARGE ABDOMINAL CYSTIC MASS MEDICATIONS: 1% lidocaine local ANESTHESIA/SEDATION: Moderate (conscious) sedation was employed during this procedure. A total of Versed 2.0 mg and Fentanyl 100 mcg was administered intravenously by the radiology nurse. Total intra-service moderate Sedation Time: 44 minutes. The patient's level of consciousness and vital signs were monitored continuously by radiology nursing throughout the procedure under my direct supervision. COMPLICATIONS: None immediate. PROCEDURE: Informed written consent was obtained from the patient after a thorough discussion of the procedural  risks, benefits and alternatives. All questions were addressed. Maximal Sterile Barrier Technique was utilized including caps, mask, sterile gowns, sterile gloves, sterile drape, hand hygiene and skin antiseptic. A timeout was performed prior to the initiation of the procedure. Previous imaging reviewed. Preliminary ultrasound performed. The large abdominal cystic mass was localized and marked for percutaneous needle access. Under sterile conditions and local anesthesia, the cystic lesion was accessed with a Safe-T-Centesis needle catheter. Position confirmed with ultrasound. Images obtained for documentation. Suction aspiration yielded 7.2 L of brown debris-filled cystic fluid. Sample sent for laboratory analysis/cytology. Postprocedure imaging demonstrates near complete collapse of the cystic lesion. No immediate complication. Patient tolerated the procedure well. IMPRESSION: Successful ultrasound aspiration of the large abdominal cystic mass yielding 7.2 L of fluid. Electronically Signed   By: M.  Shick M.D.   On: 04/22/2022 14:08   ? ? ?Assessment/Plan: ?Candidemia = suspect from abdominal abscess. Continue on micafungin. Has TEE tomorrow. No visual changes ? ?Abdominal abscess= source control with 7.2L aspiration. Continue on piptazo until culture results return. Unsure if we can will yield anything since has been on abtx for awhile. ? ?Kerolos Nehme ?[facility] ?Pager: [phone] ? ?04/22/2022, 4:21 PM ? ? ? ? ? ?

## 2022-04-22 NOTE — Progress Notes (Signed)
?PROGRESS NOTE ? ?Susan Warren [id number] DOB: 08/15/1976  ? ?PCP: Elsie Stain, MD ? ?Patient is from: Home.  Lives with son.  Independently ambulates at baseline ? ?DOA: 04/15/2022 LOS: 5 ? ?Chief complaints ?Chief Complaint  ?Patient presents with  ? Abdominal Pain  ? Nausea  ? Vomiting  ? Diarrhea  ?  ? ?Brief Narrative / Interim history: ?46 year old F with PMH of DVT/PE on warfarin, morbid obesity, lymphedema, anxiety and depression presenting with nausea, vomiting and abdominal pain, and admitted for abdominal pain.  CT abdomen and pelvis showed interval progression of the large cystic mass in the right abdomen and pelvis measuring about 24 x 24 x 24 cm and hepatic cirrhosis but no acute finding.  She had leukocytosis to 17.6.  INR was supratherapeutic.  She was started on IV Zosyn.  IR consulted and planning drainage once INR allows. ? ?Blood culture with Candida parapsilosis in 1 out of 2 bottles.  ID consulted and started micafungin in addition to IV Zosyn.  TTE without significant finding of vegetation.  ID recommending TEE.  Repeat blood culture on 4/28 NGTD. ? ?INR reversed with FFP and vitamin K.  She had US guided aspiration of large abdominal cystic mass that yielded 7.2 L brown debris-filled cystic fluid on 5/1.  Fluid study pending.  Plan for TEE on 5/2. ?  ? ?Subjective: ?Seen and examined earlier this morning before she went down for IR drainage.  No major events overnight of this morning.  Still with abdominal pressure pain.  No other complaints. ? ?Objective: ?Vitals:  ? 04/22/22 1259 04/22/22 1310 04/22/22 1315 04/22/22 1327  ?BP: 131/62 140/66 (!) 147/67 (!) 144/76  ?Pulse: 94 95 93 94  ?Resp: (!) 22 (!) 22 (!) 22 20  ?Temp:      ?TempSrc:      ?SpO2: 94% 94% 95% 96%  ?Weight:      ?Height:      ? ? ?Examination: ? ?GENERAL: No apparent distress.  Nontoxic. ?HEENT: MMM.  Vision and hearing grossly intact.  ?NECK: Supple.  No apparent JVD.  ?RESP:  No IWOB.  Fair aeration  bilaterally. ?CVS:  RRR. Heart sounds normal.  ?ABD/GI/GU: BS+.  Abdomen is slightly firm.  Mild diffuse tenderness.  No rebound or guarding. ?MSK/EXT:  Moves extremities.  Significant lymphedema in both legs. ?SKIN: Chronic skin changes related to chronic lymphedema ?NEURO: Awake and alert. Oriented appropriately.  No apparent focal neuro deficit. ?PSYCH: Calm. Normal affect.  ? ?Procedures:  ?5/1-US guided aspiration of large abdominal cystic mass that yielded 7.2 L brown debris-filled cystic fluid ? ?Microbiology summarized: ?4/25-BC ID and blood culture with Candida Parapsilosis in 1 out of 2 bottles. ?4/28-repeat blood cultures NGTD. ?5/1-cystic fluid culture pending. ? ? ?Assessment and Plan: ?* Abdominal pain due to right abdominal and pelvic cystic mass ?Likely due to progressive intra-abdominal and pelvic large cystic mass as noted on CT abdomen and pelvis.  Unclear origin of this mass.  BCID and 1 out of 2 blood culture with Candidia Parapsilosis.  Repeat blood culture NGTD.  CA125 slightly elevated to 65.5 but difficult to interpret in the setting of possible acute infection per gyn/onc.  Still with significant leukocytosis and abdominal pain.  Nausea and vomiting seems to have resolved. ?-S/p US guided drainage with removal of 7.2 L of  brown debris-filled cystic fluid on 5/1.  ?-Follow Gram stain, culture and cytology ordered. ?-Continue IV Zosyn and micafungin per ID ?-Tylenol, oxycodone and IV Dilaudid as  needed for pain based on severity ?-Schedule Senokot-S daily for bowel regimen ?-Will run by general surgery ? ?Candidemia ([place]) ?BCID and blood culture with Candida parpasilosis in 1 out of 2 bottles.  She has no visual change.  Repeat blood cultures NGTD.  TTE without vegetation. ?-ID recommends TEE, which is scheduled for 5/2 ?-N.p.o. after midnight ? ? ?Supratherapeutic INR ?INR 5.9>> 1.3 after FFP x2 and IV vitamin K 5 mg. ?-Continue holding warfarin ?-Bridging with  Lovenox ? ? ?Leukocytosis ?Intra-abdominal infection?  Seems to be worse today ?-Continue antibiotic as above ?-Continue monitoring ? ?Normocytic anemia ?Recent Labs  ?  04/07/22 ?2341 04/09/22 ?0133 04/10/22 ?0105 04/11/22 ?0113 04/15/22 ?1950 04/17/22 ?0501 04/18/22 ?[id number] 04/19/22 ?[id number] 04/21/22 ?[id number]  ?HGB 10.7* 9.7* 10.6* 9.5* 11.0* 9.7* 9.2* 10.0* 10.5*  ?Stable.  Anemia panel consistent with anemia of chronic disease and vitamin B12 deficiency ?-Vitamin B12 1000 mcg IM x2 followed by p.o. ?-Monitor H&H ? ? ?Hyponatremia ?Improved.  Monitor. ? ?Morbid obesity with body mass index of 70 and over in adult [facility]) ?Body mass index is 82.39 kg/m?Marland Kitchen Difficult situation. ?-Encourage lifestyle change to lose weight ? ?Right leg DVT ([place]) ?See PE. ? ?History of pulmonary embolism ?On warfarin at home.  ?-Holding home warfarin and bridging with subcu Lovenox ? ?Hypomagnesemia ?Mg 1.6. ?-IV magnesium sulfate 2 g x 1 ? ?Herpes simplex labialis ?Blisters right upper lip.  Reports history of cold sore. ?-Received p.o. Valtrex 2 g x 2 ? ?GERD (gastroesophageal reflux disease) ?Continue PPI ? ?Hypertension ?Normotensive. ?-Continue home meds ? ? ?DVT prophylaxis:  ?Supratherapeutic INR ? ?Code Status: Full code ?Family Communication: Patient and/or RN. Available if any question.  ?Level of care: Med-Surg ?Status is: Inpatient ?The patient will remain inpatient because: Abdominal pain, right abdominal/pelvic cystic mass and candidemia requiring further evaluation and IV antibiotics ? ? ?Final disposition: TBD ? ?Consultants:  ?Interventional radiology ?Infectious disease ?Cardiology ?GYN oncology over the phone ?General surgery  ? ?Sch Meds:  ?Scheduled Meds: ? (feeding supplement) PROSource Plus  30 mL Oral BID BM  ? amLODipine  10 mg Oral q1800  ? chlorhexidine  15 mL Mouth Rinse BID  ? enoxaparin (LOVENOX) injection  220 mg Subcutaneous Q12H  ? mouth rinse  15 mL Mouth Rinse q12n4p  ? pantoprazole  40 mg Oral Daily  ?  senna-docusate  1 tablet Oral Daily  ? vitamin B-12  1,000 mcg Oral Daily  ? ?Continuous Infusions: ? sodium chloride    ? micafungin [facility]) IV 200 mg (04/22/22 1008)  ? ondansetron (ZOFRAN) IV    ? piperacillin-tazobactam (ZOSYN)  IV 3.375 g (04/22/22 0528)  ? ?PRN Meds:.acetaminophen **OR** acetaminophen, HYDROmorphone (DILAUDID) injection, lip balm, ondansetron (ZOFRAN) IV, oxyCODONE, simethicone ? ?Antimicrobials: ?Anti-infectives (From admission, onward)  ? ? Start     Dose/Rate Route Frequency Ordered Stop  ? 04/20/22 1830  valACYclovir (VALTREX) tablet 2,000 mg       ? 2,000 mg Oral 2 times daily 04/20/22 1741 04/21/22 0909  ? 04/18/22 1245  micafungin (MYCAMINE) 200 mg in sodium chloride 0.9 % 100 mL IVPB       ? 200 mg ?110 mL/hr over 1 Hours Intravenous Daily 04/18/22 1152    ? 04/16/22 1200  piperacillin-tazobactam (ZOSYN) IVPB 3.375 g  Status:  Discontinued       ?Note to Pharmacy: High volume of distribution. Please adjust dosage as needed.TY.  ? 3.375 g ?100 mL/hr over 30 Minutes Intravenous Every 6 hours 04/16/22 0859 04/16/22 0902  ?  04/16/22 0915  piperacillin-tazobactam (ZOSYN) IVPB 3.375 g       ? 3.375 g ?12.5 mL/hr over 240 Minutes Intravenous Every 8 hours 04/16/22 0902    ? 04/16/22 0430  piperacillin-tazobactam (ZOSYN) IVPB 3.375 g       ? 3.375 g ?100 mL/hr over 30 Minutes Intravenous  Once 04/16/22 0427 04/16/22 0530  ? ?  ? ? ? ?I have personally reviewed the following labs and images: ?CBC: ?Recent Labs  ?Lab 04/15/22 ?1950 04/17/22 ?0501 04/18/22 ?[id number] 04/19/22 ?[id number] 04/21/22 ?[id number] 04/22/22 ?0025  ?WBC 17.6* 15.2* 16.7* 15.8* 19.9* 19.0*  ?NEUTROABS 14.4*  --   --  12.0*  --   --   ?HGB 11.0* 9.7* 9.2* 10.0* 10.5* 10.1*  ?HCT 37.6 31.3* 31.0* 33.5* 35.5* 33.9*  ?MCV 88.9 88.2 88.6 89.3 90.3 89.0  ?PLT 531* 514* 503* 512* 538* 483*  ? ?BMP &GFR ?Recent Labs  ?Lab 04/17/22 ?0501 04/18/22 ?0414 04/19/22 ?[id number] 04/21/22 ?[id number] 04/22/22 ?0025  ?NA 137 133* 133* 135 134*  ?K 4.1 4.1 4.1 3.9  4.3  ?CL 101 101 97* 97* 98  ?CO2 26 26 25 29 30   ?GLUCOSE 103* 122* 144* 133* 155*  ?BUN 12 10 10 8 8   ?CREATININE 0.79 0.67 0.58 0.64 0.62  ?CALCIUM 8.2* 8.0* 8.0* 8.2* 8.2*  ?MG  --  1.9 1.9 1.6* 1.8  ?PHOS  --  3.7 3.2 3.4 3.3  ? ?Estimated Crea

## 2022-04-22 NOTE — Progress Notes (Signed)
?  [facility] for Infectious Disease   ? ?Date of Admission:  04/15/2022   Total days of antibiotics 8 ?       Day 5 micafungin ?        ? ?ID: Susan Warren is a 46 y.o. female with  fevers, leukocytosis from large abdominal/pelvic abscess complicated by candidemia.  ?Principal Problem: ?  Abdominal pain due to right abdominal and pelvic cystic mass ?Active Problems: ?  Hypertension ?  GERD (gastroesophageal reflux disease) ?  History of pulmonary embolism ?  Right leg DVT ([place]) ?  Lymphedema of both lower extremities ?  Morbid obesity with body mass index of 70 and over in adult [facility]) ?  Right abdominal and pelvic cystic mass ?  Supratherapeutic INR ?  Hyponatremia ?  Normocytic anemia ?  Leukocytosis ?  Candidemia ([place]) ?  Vitamin B12 deficiency ?  Herpes simplex labialis ?  Hypomagnesemia ? ? ? ?Subjective: ?Chills at night but today had 7.2L of intra-abdominal fluid aspirated by IR- ultrasound. Suction aspirationuilded 7.2L of brown debris-filled cystic fluid. She feels like she has less abdominal pressure ? ?Medications:  ? (feeding supplement) PROSource Plus  30 mL Oral BID BM  ? amLODipine  10 mg Oral q1800  ? chlorhexidine  15 mL Mouth Rinse BID  ? enoxaparin (LOVENOX) injection  220 mg Subcutaneous Q12H  ? mouth rinse  15 mL Mouth Rinse q12n4p  ? pantoprazole  40 mg Oral Daily  ? senna-docusate  1 tablet Oral Daily  ? vitamin B-12  1,000 mcg Oral Daily  ? ? ?Objective: ?Vital signs in last 24 hours: ?Temp:  [98.4 ?F (36.9 ?C)-99.5 ?F (37.5 ?C)] 98.4 ?F (36.9 ?C) (05/01 1439) ?Pulse Rate:  [93-101] 93 (05/01 1439) ?Resp:  [16-24] 16 (05/01 1439) ?BP: (118-158)/(60-76) 144/67 (05/01 1439) ?SpO2:  [87 %-96 %] 95 % (05/01 1525) ? ?Physical Exam  ?Constitutional:  oriented to person, place, and time. appears well-developed and well-nourished. No distress.  ?HENT: [place]/AT, PERRLA, no scleral icterus ?Mouth/Throat: Oropharynx is clear and moist. No oropharyngeal exudate.  ?Cardiovascular: Normal rate, regular  rhythm and normal heart sounds. Exam reveals no gallop and no friction rub.  ?No murmur heard.  ?Pulmonary/Chest: Effort normal and breath sounds normal. No respiratory distress.  has no wheezes.  ?Neck = supple, no nuchal rigidity ?Abdominal: Soft. Bowel sounds are normal.  exhibits no distension. There is no tenderness. Large pannus ?Lymphadenopathy: no cervical adenopathy. No axillary adenopathy ?Neurological: alert and oriented to person, place, and time.  ?Skin: Skin is warm and dry. No rash noted. No erythema.  ?Ext: lymphadema changes ?Psychiatric: a normal mood and affect.  behavior is normal.  ? ? ?Lab Results ?Recent Labs  ?  04/21/22 ?0718 04/22/22 ?0025  ?WBC 19.9* 19.0*  ?HGB 10.5* 10.1*  ?HCT 35.5* 33.9*  ?NA 135 134*  ?K 3.9 4.3  ?CL 97* 98  ?CO2 29 30  ?BUN 8 8  ?CREATININE 0.64 0.62  ? ?Liver Panel ?Recent Labs  ?  04/21/22 ?0718 04/22/22 ?0025  ?PROT  --  7.4  ?ALBUMIN 2.0* 1.9*  ?AST  --  15  ?ALT  --  9  ?ALKPHOS  --  90  ?BILITOT  --  0.8  ? ?Sedimentation Rate ?No results for input(s): ESRSEDRATE in the last 72 hours. ?C-Reactive Protein ?No results for input(s): CRP in the last 72 hours. ? ?Microbiology: ?reviewed ?Studies/Results: ?[place] Abscess Drain ? ?Result Date: 04/22/2022 ?INDICATION: Chronic very large cystic abdominal mass  presumed to be a exophytic liver cyst EXAM: ULTRASOUND ASPIRATION OF THE LARGE ABDOMINAL CYSTIC MASS MEDICATIONS: 1% lidocaine local ANESTHESIA/SEDATION: Moderate (conscious) sedation was employed during this procedure. A total of Versed 2.0 mg and Fentanyl 100 mcg was administered intravenously by the radiology nurse. Total intra-service moderate Sedation Time: 44 minutes. The patient's level of consciousness and vital signs were monitored continuously by radiology nursing throughout the procedure under my direct supervision. COMPLICATIONS: None immediate. PROCEDURE: Informed written consent was obtained from the patient after a thorough discussion of the procedural  risks, benefits and alternatives. All questions were addressed. Maximal Sterile Barrier Technique was utilized including caps, mask, sterile gowns, sterile gloves, sterile drape, hand hygiene and skin antiseptic. A timeout was performed prior to the initiation of the procedure. Previous imaging reviewed. Preliminary ultrasound performed. The large abdominal cystic mass was localized and marked for percutaneous needle access. Under sterile conditions and local anesthesia, the cystic lesion was accessed with a Safe-T-Centesis needle catheter. Position confirmed with ultrasound. Images obtained for documentation. Suction aspiration yielded 7.2 L of brown debris-filled cystic fluid. Sample sent for laboratory analysis/cytology. Postprocedure imaging demonstrates near complete collapse of the cystic lesion. No immediate complication. Patient tolerated the procedure well. IMPRESSION: Successful ultrasound aspiration of the large abdominal cystic mass yielding 7.2 L of fluid. Electronically Signed   By: Jerilynn Mages.  Shick M.D.   On: 04/22/2022 14:08   ? ? ?Assessment/Plan: ?Candidemia = suspect from abdominal abscess. Continue on micafungin. Has TEE tomorrow. No visual changes ? ?Abdominal abscess= source control with 7.2L aspiration. Continue on piptazo until culture results return. Unsure if we can will yield anything since has been on abtx for awhile. ? ?Carlyle Basques ?[place] for Infectious Diseases ?Pager: [phone] ? ?04/22/2022, 4:21 PM ? ? ? ? ? ?

## 2022-04-22 NOTE — Progress Notes (Signed)
ANTICOAGULATION CONSULT NOTE - Follow Up Consult ? ?Pharmacy Consult for Heparin ?Indication: h/o PE ? ?Allergies  ?Allergen Reactions  ? Adhesive [Tape] Anaphylaxis and Swelling  ?  *Adhesive Spray*   ? ? ?Patient Measurements: ?Height: 5\' 4"  (162.6 cm) ?Weight: (!) 217.7 kg (480 lb) ?IBW/kg (Calculated) : 54.7 ?Heparin Dosing Weight: 113 kg ? ?Vital Signs: ?Temp: 99.5 ?F (37.5 ?C) (04/30 1934) ?Temp Source: Oral (04/30 1934) ?BP: 158/69 (04/30 1934) ?Pulse Rate: 100 (04/30 1934) ? ?Labs: ?Recent Labs  ?  04/19/22 ?0439 04/19/22 ?1228 04/20/22 ?0420 04/20/22 ?1649 04/21/22 ?0718 04/21/22 ?1559 04/22/22 ?0025  ?HGB 10.0*  --   --   --  10.5*  --  10.1*  ?HCT 33.5*  --   --   --  35.5*  --  33.9*  ?PLT 512*  --   --   --  538*  --  483*  ?LABPROT 32.9*   < > 17.6*  --  16.2*  --  17.1*  ?INR 3.3*   < > 1.5*  --  1.3*  --  1.4*  ?HEPARINUNFRC  --   --   --    < > <0.10* <0.10* 0.14*  ?CREATININE 0.58  --   --   --  0.64  --   --   ? < > = values in this interval not displayed.  ? ? ? ?Estimated Creatinine Clearance: 162.8 mL/min (by C-G formula based on SCr of 0.64 mg/dL). ? ?Assessment: ?AC/Heme: PTA warfarin for hx PE on hold for high INR (5.7 on admit) and possible cyst aspiration (INR must be <2.5)  ?- IV Vit K 5mg  given 4/28 ?- Start IV heparin 4/29 ? ?04/22/22 ?- Heparin level = 0.14 (subtherapeutic however level is now increasing) with heparin gtt @ 3900 units/hr ?- No complications of therapy noted and no line issues reported by RN ?- INR 1.4, Hgb 10.1 stable. Plts 483K ? ?Goal of Therapy:  ?Heparin level 0.3-0.7 units/ml ?Monitor platelets by anticoagulation protocol: Yes ?  ?Plan:  ?Rebolus heparin 4000 units IV x 1 ?Increase IV heparin to 4500 units/hr   ?Will recheck heparin level in 6 hrs.   ?Daily HL and CBC ?TEE 5/1 ?  ?5/29, PharmD ?04/22/2022,1:02 AM ? ? ?

## 2022-04-22 NOTE — Progress Notes (Signed)
ANTICOAGULATION CONSULT NOTE - Follow Up Consult ? ?Pharmacy Consult for Heparin >> Lovenox ?Indication: h/o PE ? ?Allergies  ?Allergen Reactions  ? Adhesive [Tape] Anaphylaxis and Swelling  ?  *Adhesive Spray*   ? ? ?Patient Measurements: ?Height: 5\' 4"  (162.6 cm) ?Weight: (!) 217.7 kg (480 lb) ?IBW/kg (Calculated) : 54.7 ?Heparin Dosing Weight: 113 kg ? ?Vital Signs: ?Temp: 99.5 ?F (37.5 ?C) (04/30 1934) ?Temp Source: Oral (04/30 1934) ?BP: 158/69 (04/30 1934) ?Pulse Rate: 100 (04/30 1934) ? ?Labs: ?Recent Labs  ?  04/19/22 ?0439 04/19/22 ?1228 04/20/22 ?0420 04/20/22 ?1649 04/21/22 ?0718 04/21/22 ?1559 04/22/22 ?0025  ?HGB 10.0*  --   --   --  10.5*  --  10.1*  ?HCT 33.5*  --   --   --  35.5*  --  33.9*  ?PLT 512*  --   --   --  538*  --  483*  ?LABPROT 32.9*   < > 17.6*  --  16.2*  --  17.1*  ?INR 3.3*   < > 1.5*  --  1.3*  --  1.4*  ?HEPARINUNFRC  --   --   --    < > <0.10* <0.10* 0.14*  ?CREATININE 0.58  --   --   --  0.64  --  0.62  ? < > = values in this interval not displayed.  ? ? ? ?Estimated Creatinine Clearance: 162.8 mL/min (by C-G formula based on SCr of 0.62 mg/dL). ? ?Assessment: ?AC/Heme: PTA warfarin for hx PE on hold for high INR (5.7 on admit) and possible cyst aspiration (INR must be <2.5)  ?- IV Vit K 5mg  given 4/28 ?- Start IV heparin 4/29 ? ?04/22/22 ?- Heparin level = 0.14 (subtherapeutic however level is now increasing) with heparin gtt @ 3900 units/hr ?- No complications of therapy noted and no line issues reported by RN ?- INR 1.4, Hgb 10.1 stable. Plts 483K ? ?UPDATE: ?Notified by RN that IV pumps have a hard stop of upper limit for heparin gtt @ 39.9999 ml/hr, so increasing rate to 45 ml/hr is not possible ? ?Contacted Dr 5/29 who provided verbal orders to change IV heparin to Lovenox ? ?Goal of Therapy:  ?Monitor platelets by anticoagulation protocol: Yes ?  ?Plan:  ?D/C heparin infusion ?Begin Lovenox 220mg  sq q12h ?Check LMWH levels once at steady-state ?Continue to follow CBC and  renal function ? ?  ?Jodi Kappes, 06/22/22, PharmD ?04/22/2022,1:31 AM ? ? ?

## 2022-04-23 ENCOUNTER — Inpatient Hospital Stay (HOSPITAL_COMMUNITY): Payer: 59 | Admitting: Anesthesiology

## 2022-04-23 ENCOUNTER — Encounter (HOSPITAL_COMMUNITY): Payer: Self-pay | Admitting: Student

## 2022-04-23 ENCOUNTER — Encounter (HOSPITAL_COMMUNITY)
Admission: EM | Disposition: A | Discharge status: Home Health | Payer: Self-pay | Source: Home / Self Care | Attending: Student

## 2022-04-23 ENCOUNTER — Ambulatory Visit (HOSPITAL_COMMUNITY)
Admission: EM | Admit: 2022-04-23 | Discharge: 2022-04-23 | Disposition: A | Discharge status: Home / Self Care | Payer: 59 | Source: Ambulatory Visit | Attending: Cardiovascular Disease | Admitting: Cardiovascular Disease

## 2022-04-23 ENCOUNTER — Inpatient Hospital Stay (HOSPITAL_COMMUNITY): Payer: 59

## 2022-04-23 DIAGNOSIS — R1084 Generalized abdominal pain: Secondary | ICD-10-CM | POA: Diagnosis not present

## 2022-04-23 DIAGNOSIS — K219 Gastro-esophageal reflux disease without esophagitis: Secondary | ICD-10-CM | POA: Diagnosis not present

## 2022-04-23 DIAGNOSIS — I10 Essential (primary) hypertension: Secondary | ICD-10-CM | POA: Diagnosis not present

## 2022-04-23 DIAGNOSIS — R7881 Bacteremia: Secondary | ICD-10-CM

## 2022-04-23 DIAGNOSIS — Z9989 Dependence on other enabling machines and devices: Secondary | ICD-10-CM

## 2022-04-23 DIAGNOSIS — G4733 Obstructive sleep apnea (adult) (pediatric): Secondary | ICD-10-CM

## 2022-04-23 DIAGNOSIS — Z86711 Personal history of pulmonary embolism: Secondary | ICD-10-CM | POA: Diagnosis not present

## 2022-04-23 HISTORY — PX: TEE WITHOUT CARDIOVERSION: SHX5443

## 2022-04-23 LAB — RENAL FUNCTION PANEL
Albumin: 1.7 g/dL — ABNORMAL LOW (ref 3.5–5.0)
Anion gap: 7 (ref 5–15)
BUN: 7 mg/dL (ref 6–20)
CO2: 28 mmol/L (ref 22–32)
Calcium: 8 mg/dL — ABNORMAL LOW (ref 8.9–10.3)
Chloride: 101 mmol/L (ref 98–111)
Creatinine, Ser: 0.47 mg/dL (ref 0.44–1.00)
GFR, Estimated: >60 mL/min (ref >60)
Glucose, Bld: 125 mg/dL — ABNORMAL HIGH (ref 70–99)
Phosphorus: 4.3 mg/dL (ref 2.5–4.6)
Potassium: 4.2 mmol/L (ref 3.5–5.1)
Sodium: 136 mmol/L (ref 135–145)

## 2022-04-23 LAB — CBC
HCT: 34.2 % — ABNORMAL LOW (ref 36.0–46.0)
Hemoglobin: 10.3 g/dL — ABNORMAL LOW (ref 12.0–15.0)
MCH: 26.5 pg (ref 26.0–34.0)
MCHC: 30.1 g/dL (ref 30.0–36.0)
MCV: 88.1 fL (ref 80.0–100.0)
Platelets: 432 K/uL — ABNORMAL HIGH (ref 150–400)
RBC: 3.88 MIL/uL (ref 3.87–5.11)
RDW: 16.3 % — ABNORMAL HIGH (ref 11.5–15.5)
WBC: 14.1 K/uL — ABNORMAL HIGH (ref 4.0–10.5)
nRBC: 0.3 % — ABNORMAL HIGH (ref 0.0–0.2)

## 2022-04-23 LAB — PROTIME-INR
INR: 1.3 — ABNORMAL HIGH (ref 0.8–1.2)
Prothrombin Time: 15.6 seconds — ABNORMAL HIGH (ref 11.4–15.2)

## 2022-04-23 LAB — MAGNESIUM: Magnesium: 1.7 mg/dL (ref 1.7–2.4)

## 2022-04-23 SURGERY — ECHOCARDIOGRAM, TRANSESOPHAGEAL
Anesthesia: Monitor Anesthesia Care

## 2022-04-23 MED ORDER — PROPOFOL 10 MG/ML IV BOLUS
INTRAVENOUS | Status: DC | PRN
Start: 2022-04-23 — End: 2022-04-23
  Administered 2022-04-23: 40 mg via INTRAVENOUS

## 2022-04-23 MED ORDER — ENSURE MAX PROTEIN PO LIQD
11.0000 [oz_av] | Freq: Two times a day (BID) | ORAL | Status: DC
  Administered 2022-04-23 – 2022-04-28 (×11): 11 [oz_av] via ORAL
  Filled 2022-04-23 (×14): qty 330

## 2022-04-23 MED ORDER — KETAMINE HCL 10 MG/ML IJ SOLN
INTRAMUSCULAR | Status: DC | PRN
  Administered 2022-04-23: 20 mg via INTRAVENOUS

## 2022-04-23 MED ORDER — WARFARIN SODIUM 5 MG PO TABS
5.0000 mg | ORAL_TABLET | Freq: Once | ORAL | Status: AC
  Administered 2022-04-23: 5 mg via ORAL
  Filled 2022-04-23: qty 1

## 2022-04-23 MED ORDER — DEXMEDETOMIDINE (PRECEDEX) IN NS 20 MCG/5ML (4 MCG/ML) IV SYRINGE
PREFILLED_SYRINGE | INTRAVENOUS | Status: AC
  Filled 2022-04-23: qty 10

## 2022-04-23 MED ORDER — MAGNESIUM SULFATE 2 GM/50ML IV SOLN
2.0000 g | Freq: Once | INTRAVENOUS | Status: AC
  Administered 2022-04-23: 2 g via INTRAVENOUS
  Filled 2022-04-23: qty 50

## 2022-04-23 MED ORDER — BUTAMBEN-TETRACAINE-BENZOCAINE 2-2-14 % EX AERO
INHALATION_SPRAY | CUTANEOUS | Status: DC | PRN
  Administered 2022-04-23: 2 via TOPICAL

## 2022-04-23 MED ORDER — WARFARIN - PHARMACIST DOSING INPATIENT
Freq: Every day | Status: DC
Start: 2022-04-23 — End: 2022-04-29

## 2022-04-23 MED ORDER — PROSOURCE PLUS PO LIQD
30.0000 mL | Freq: Two times a day (BID) | ORAL | Status: DC
  Filled 2022-04-23 (×5): qty 30

## 2022-04-23 MED ORDER — KETAMINE HCL 50 MG/5ML IJ SOSY
PREFILLED_SYRINGE | INTRAMUSCULAR | Status: AC
  Filled 2022-04-23: qty 5

## 2022-04-23 MED ORDER — PROPOFOL 500 MG/50ML IV EMUL
INTRAVENOUS | Status: DC | PRN
  Administered 2022-04-23: 75 ug/kg/min via INTRAVENOUS

## 2022-04-23 MED ORDER — ENSURE MAX PROTEIN PO LIQD
11.0000 [oz_av] | Freq: Two times a day (BID) | ORAL | Status: DC
  Filled 2022-04-23: qty 330

## 2022-04-23 NOTE — Progress Notes (Signed)
?PROGRESS NOTE ? ?Susan Warren ZOX:[id number] DOB: 09/07/1976  ? ?PCP: Storm FriskWright, Patrick E, MD ? ?Patient is from: Home.  Lives with son.  Independently ambulates at baseline ? ?DOA: 04/15/2022 LOS: 6 ? ?Chief complaints ?Chief Complaint  ?Patient presents with  ? Abdominal Pain  ? Nausea  ? Vomiting  ? Diarrhea  ?  ? ?Brief Narrative / Interim history: ?46 year old F with PMH of DVT/PE on warfarin, morbid obesity, lymphedema, anxiety and depression presenting with nausea, vomiting and abdominal pain, and admitted for abdominal pain.  CT abdomen and pelvis showed interval progression of the large cystic mass in the right abdomen and pelvis measuring about 24 x 24 x 24 cm and hepatic cirrhosis but no acute finding.  She had leukocytosis to 17.6.  INR was supratherapeutic.  She was started on IV Zosyn.  IR consulted and planning drainage once INR allows. ? ?Blood culture with Candida parapsilosis in 1 out of 2 bottles.  ID consulted and started micafungin in addition to IV Zosyn.  TTE without significant finding of vegetation.  ID recommending TEE.  Repeat blood culture on 4/28 NGTD. ? ?INR reversed with FFP and vitamin K.  She had US guided aspiration of large abdominal cystic mass that yielded 7.2 L brown debris-filled cystic fluid on 5/1.  Fluid study pending.  TEE on 5/2 without vegetation. ? ?Restarted warfarin with Lovenox bridge.  ID to decide about antibiotics ? ? ? ?Subjective: ?Seen and examined earlier this morning before she went for TEE.Marland Kitchen.  No major events overnight of this morning.  Reports significant improvement in abdominal pain after drainage of abdominal cyst.  Denies nausea or vomiting. ? ?Objective: ?Vitals:  ? 04/23/22 1316 04/23/22 1325 04/23/22 1335 04/23/22 1453  ?BP: (!) 125/51 118/65  (!) 121/58  ?Pulse: 85 86  85  ?Resp: (!) 24 (!) 25  14  ?Temp: 97.7 ?F (36.5 ?C)   97.9 ?F (36.6 ?C)  ?TempSrc: Other (Comment)   Oral  ?SpO2: 93% 93% 94% 94%  ?Weight:      ?Height:       ? ? ?Examination: ? ?GENERAL: No apparent distress.  Nontoxic. ?HEENT: MMM.  Vision and hearing grossly intact.  ?NECK: Supple.  No apparent JVD.  ?RESP:  No IWOB.  Fair aeration bilaterally. ?CVS:  RRR. Heart sounds normal.  ?ABD/GI/GU: BS+. Abd soft, NTND.  ?MSK/EXT:  Moves extremities.  Significant lymphedema in both legs. ?SKIN: Chronic skin changes related to chronic lymphedema. ?NEURO: Awake and alert. Oriented appropriately.  No apparent focal neuro deficit. ?PSYCH: Calm. Normal affect.  ? ?Procedures:  ?5/1-US guided aspiration of large abdominal cystic mass that yielded 7.2 L brown debris-filled cystic fluid ? ?Microbiology summarized: ?4/25-BC ID and blood culture with Candida Parapsilosis in 1 out of 2 bottles. ?4/28-repeat blood cultures NGTD. ?5/1-cystic fluid culture pending. ? ? ?Assessment and Plan: ?* Abdominal pain due to right abdominal and pelvic cystic mass ?Likely due to progressive intra-abdominal and pelvic large cystic mass as noted on CT abdomen and pelvis.  Unclear origin of this mass.  BCID and 1 out of 2 blood culture with Candidia Parapsilosis.  Repeat blood culture NGTD.  CA125 slightly elevated to 65.5 but difficult to interpret in the setting of possible acute infection per gyn/onc.  Nausea and vomiting seems to have resolved.  Leukocytosis and abdominal pain improved after drain. ?-S/p US guided drainage with removal of 7.2 L of  brown debris-filled cystic fluid on 5/1.  ?-Follow fluid culture and cytology. ?-Continue IV  Zosyn and micafungin per ID ?-Tylenol, oxycodone and IV Dilaudid as needed for pain based on severity ?-Schedule Senokot-S daily for bowel regimen ?-General surgery recommends outpatient follow-up. ? ?Candidemia ([facility]) ?BCID and blood culture with Candida parpasilosis in 1 out of 2 bottles.  She has no visual change.  Repeat blood cultures NGTD.  TTE and TEE without vegetation. ?-Continue micafungin. ?-Follow fluid culture. ? ? ?Supratherapeutic INR ?INR 5.9>> 1.3  after FFP x2 and IV vitamin K 5 mg. ?-Resume warfarin with heparin bridge. ? ? ?Leukocytosis ?Improved after draining abdominal cyst. ?-Continue antibiotic as above ?-Continue monitoring ? ?Normocytic anemia ?Recent Labs  ?  04/09/22 ?0133 04/10/22 ?0105 04/11/22 ?0113 04/15/22 ?1950 04/17/22 ?0501 04/18/22 ?3428 04/19/22 ?7681 04/21/22 ?1572 04/22/22 ?0025 04/23/22 ?6203  ?HGB 9.7* 10.6* 9.5* 11.0* 9.7* 9.2* 10.0* 10.5* 10.1* 10.3*  ?Stable.  Anemia panel consistent with anemia of chronic disease and vitamin B12 deficiency ?-Vitamin B12 1000 mcg IM x2 followed by p.o. ?-Monitor H&H ? ? ?Morbid obesity with body mass index of 70 and over in adult [facility]) ?Body mass index is 82.39 kg/m?Marland Kitchen Difficult situation. ?-Encourage lifestyle change to lose weight ? ?Right leg DVT ([facility]) ?See PE. ? ?History of pulmonary embolism ?On warfarin at home.  ?-Holding home warfarin and bridging with subcu Lovenox ? ?Hypomagnesemia ?Mg 1.7. ?-IV magnesium sulfate 2 g x 1 ? ?Herpes simplex labialis ?Blisters right upper lip.  Reports history of cold sore. ?-Received p.o. Valtrex 2 g x 2 ? ?Hyponatremia ?Resolved. ? ?GERD (gastroesophageal reflux disease) ?Continue PPI ? ?Hypertension ?Normotensive. ?-Continue home meds ? ? ?DVT prophylaxis:  ? ?warfarin (COUMADIN) tablet 5 mg  ? ?Code Status: Full code ?Family Communication: Patient and/or RN. Available if any question.  ?Level of care: Med-Surg ?Status is: Inpatient ?The patient will remain inpatient because: Abdominal pain, right abdominal/pelvic cystic mass and candidemia requiring IV antibiotics and subtherapeutic INR requiring bridging with high-dose Lovenox ? ? ?Final disposition: Likely home once medically cleared. ? ?Consultants:  ?Interventional radiology ?Infectious disease ?Cardiology-signed off ?GYN oncology over the phone ?General surgery over the phone ? ?Sch Meds:  ?Scheduled Meds: ? (feeding supplement) PROSource Plus  30 mL Oral BID BM  ? amLODipine  10 mg Oral q1800  ?  chlorhexidine  15 mL Mouth Rinse BID  ? enoxaparin (LOVENOX) injection  220 mg Subcutaneous Q12H  ? mouth rinse  15 mL Mouth Rinse q12n4p  ? pantoprazole  40 mg Oral Daily  ? Ensure Max Protein  11 oz Oral BID  ? senna-docusate  1 tablet Oral Daily  ? vitamin B-12  1,000 mcg Oral Daily  ? warfarin  5 mg Oral Once  ? Warfarin - Pharmacist Dosing Inpatient   Does not apply q1600  ? ?Continuous Infusions: ? micafungin [facility]) IV 200 mg (04/23/22 0919)  ? ondansetron (ZOFRAN) IV    ? piperacillin-tazobactam (ZOSYN)  IV 3.375 g (04/23/22 1041)  ? ?PRN Meds:.acetaminophen **OR** acetaminophen, HYDROmorphone (DILAUDID) injection, lip balm, ondansetron (ZOFRAN) IV, oxyCODONE, simethicone ? ?Antimicrobials: ?Anti-infectives (From admission, onward)  ? ? Start     Dose/Rate Route Frequency Ordered Stop  ? 04/20/22 1830  valACYclovir (VALTREX) tablet 2,000 mg       ? 2,000 mg Oral 2 times daily 04/20/22 1741 04/21/22 0909  ? 04/18/22 1245  micafungin (MYCAMINE) 200 mg in sodium chloride 0.9 % 100 mL IVPB       ? 200 mg ?110 mL/hr over 1 Hours Intravenous Daily 04/18/22 1152    ? 04/16/22 1200  piperacillin-tazobactam (ZOSYN) IVPB 3.375 g  Status:  Discontinued       ?Note to Pharmacy: High volume of distribution. Please adjust dosage as needed.TY.  ? 3.375 g ?100 mL/hr over 30 Minutes Intravenous Every 6 hours 04/16/22 0859 04/16/22 0902  ? 04/16/22 0915  piperacillin-tazobactam (ZOSYN) IVPB 3.375 g       ? 3.375 g ?12.5 mL/hr over 240 Minutes Intravenous Every 8 hours 04/16/22 0902    ? 04/16/22 0430  piperacillin-tazobactam (ZOSYN) IVPB 3.375 g       ? 3.375 g ?100 mL/hr over 30 Minutes Intravenous  Once 04/16/22 0427 04/16/22 0530  ? ?  ? ? ? ?I have personally reviewed the following labs and images: ?CBC: ?Recent Labs  ?Lab 04/18/22 ?0414 04/19/22 ?0439 04/21/22 ?6237 04/22/22 ?0025 04/23/22 ?6283  ?WBC 16.7* 15.8* 19.9* 19.0* 14.1*  ?NEUTROABS  --  12.0*  --   --   --   ?HGB 9.2* 10.0* 10.5* 10.1* 10.3*  ?HCT 31.0*  33.5* 35.5* 33.9* 34.2*  ?MCV 88.6 89.3 90.3 89.0 88.1  ?PLT 503* 512* 538* 483* 432*  ? ?BMP &GFR ?Recent Labs  ?Lab 04/18/22 ?0414 04/19/22 ?0439 04/21/22 ?1517 04/22/22 ?0025 04/23/22 ?6160  ?NA 133* 133* 135 134* 136  ?K 4.1 4.1

## 2022-04-23 NOTE — Progress Notes (Signed)
Transferred by [facility] to [place] [facility] ?

## 2022-04-23 NOTE — Progress Notes (Signed)
Nutrition Follow-up ? ?DOCUMENTATION CODES:  ? ?Morbid obesity ? ?INTERVENTION:  ?- continue 30 ml Prosource Plus BID, each supplement provides 100 kcal and 15 grams protein. ?- will order Ensure Max BID, each supplement provides 150 kcal and 30 grams of protein. ? ? ?NUTRITION DIAGNOSIS:  ? ?Inadequate oral intake related to acute illness, nausea, decreased appetite as evidenced by per patient/family report. -improving ? ?GOAL:  ? ?Patient will meet greater than or equal to 90% of their needs -progressing ? ?MONITOR:  ? ?PO intake, Supplement acceptance, Labs, Weight trends, I & O's ? ?ASSESSMENT:  ? ?46 year old female with medical history of DVT/PE on warfarin, morbid obesity, lymphedema, anxiety, and depression. She presented to the ED due to N/V and abdominal pain. In the ED, CT abdomen/pelvis showed interval progression of large cystic mass in the R abdomen and pelvis and hepatic cirrhosis. IR consult and planning for cyst drainage once INR allows. ? ?Yesterday patient had 7.2 L of brown cystic fluid drained by IR.  ? ?She has been NPO vs on Heart Healthy diet since admission. She is currently NPO since midnight pending TEE today. During times on Heart Healthy diet, she was mainly eating 50-100% over the past 1 week. ? ?She is feeling hungry currently.  ? ?She has been accepting Prosource ~90% of the time offered. ? ?Talked with patient about increased protein need. She shares distaste for protein-rich foods here but agreeable to incorporate more protein once she returns home. ? ?PTA she was drinking Premier Protein, or similar, 1-2 times/day. She is receptive to Ensure Max (vanilla) being ordered for during hospitalization. ? ?She has not been weighed since admission on 4/24. Moderate pitting edema to BLE documented in the edema section of flow sheet.  ? ? ?Labs reviewed; Ca: 8 mg/dl. ?Medications reviewed; 40 mg oral protonix/day, 1 tablet senokot/day, 1000 mcg oral cyanocobalamin/day. ? ? ?Diet Order:    ?Diet Order   ? ?       ?  Diet Heart Room service appropriate? Yes; Fluid consistency: Thin  Diet effective now       ?  ? ?  ?  ? ?  ? ? ?EDUCATION NEEDS:  ? ?Education needs have been addressed ? ?Skin:  Skin Assessment: Reviewed RN Assessment ? ?Last BM:  5/2 per nursing flow sheet ? ?Height:  ? ?Ht Readings from Last 1 Encounters:  ?04/15/22 5\' 4"  (1.626 m)  ? ? ?Weight:  ? ?Wt Readings from Last 1 Encounters:  ?04/15/22 (!) 217.7 kg  ? ? ? ?BMI:  Body mass index is 82.39 kg/m?. ? ?Estimated Nutritional Needs:  ?Kcal:  2200-2500 kcal ?Protein:  115-130 grams ?Fluid:  >/= 1.8 L/day ? ? ? ? ?04/17/22, MS, RD, LDN ?Registered Dietitian II ?Inpatient Clinical Nutrition ?RD pager # and on-call/weekend pager # available in AMION  ? ?

## 2022-04-23 NOTE — Progress Notes (Signed)
ANTICOAGULATION CONSULT NOTE - Follow Up Consult ? ?Pharmacy Consult for enoxaparin, warfarin  ?Indication: h/o PE ? ?Allergies  ?Allergen Reactions  ? Adhesive [Tape] Anaphylaxis and Swelling  ?  *Adhesive Spray*   ? ? ?Patient Measurements: ?Height: 5\' 4"  (162.6 cm) ?Weight: (!) 217.7 kg (480 lb) ?IBW/kg (Calculated) : 54.7 ?Heparin Dosing Weight: 113 kg ? ?Vital Signs: ?Temp: 97.7 ?F (36.5 ?C) (05/02 1316) ?Temp Source: Other (Comment) (05/02 1316) ?BP: 118/65 (05/02 1325) ?Pulse Rate: 86 (05/02 1325) ? ?Labs: ?Recent Labs  ?  04/21/22 ?0718 04/21/22 ?1559 04/22/22 ?0025 04/23/22 ?06/23/22  ?HGB 10.5*  --  10.1* 10.3*  ?HCT 35.5*  --  33.9* 34.2*  ?PLT 538*  --  483* 432*  ?LABPROT 16.2*  --  17.1* 15.6*  ?INR 1.3*  --  1.4* 1.3*  ?HEPARINUNFRC <0.10* <0.10* 0.14*  --   ?CREATININE 0.64  --  0.62 0.47  ? ? ? ?Estimated Creatinine Clearance: 162.8 mL/min (by C-G formula based on SCr of 0.47 mg/dL). ? ?Assessment: ?AC/Heme: PTA warfarin for hx PE on hold for high INR (5.7 on admit) and possible cyst aspiration (INR must be <2.5)  ?- IV Vit K 5mg  given 4/28 ?- Stop heparin ?- Pt was transitioned to enoxaparin 220 mg SQ q12h  ?- Notified by RN that IV pumps have a hard stop of upper limit for heparin gtt @ 39.9999 ml/hr, so increasing rate to 45 ml/hr is not possible ?  ?Contacted Dr who provided verbal orders to change IV heparin to Lovenox ? ?Pt warfarin regimen is warfarin 5 mg daily except 2.5 mg on Monday and Friday. ? ? ?Today, 04/23/22  ?INR 1.3, subtherapeutic after doses were held  ?Hgb 10.3 low but stable ?Plt 432  ?TEE completed 5/2  ? ? ?Goal of Therapy:  ?Heparin level 0.3-0.7 units/ml ?Monitor platelets by anticoagulation protocol: Yes ?  ?Plan:  ?Enoxaparin 220 mg SQ q12h  ?Will obtain LMWH levels  ?Warfarin 5 mg PO x 1  ?Monitor daily INR, CBC ?TEE 5/1 ? ? ?06/23/22, PharmD, BCPS ?04/23/2022 1:49 PM ? ? ? ?

## 2022-04-23 NOTE — Interval H&P Note (Signed)
History and Physical Interval Note: ? ?04/23/2022 ?11:54 AM ? ?Susan Warren  has presented today for surgery, with the diagnosis of bacteremia.  The various methods of treatment have been discussed with the patient and family. After consideration of risks, benefits and other options for treatment, the patient has consented to  Procedure(s): ?TRANSESOPHAGEAL ECHOCARDIOGRAM (TEE) (N/A) as a surgical intervention.  The patient's history has been reviewed, patient examined, no change in status, stable for surgery.  I have reviewed the patient's chart and labs.  Questions were answered to the patient's satisfaction.   ? ? ?Susan Warren ? ? ?

## 2022-04-23 NOTE — Progress Notes (Signed)
Patient returned from [facility] via [facility].  Patient medication administered for 6/10 pain in abdomen.  Denies N/V.  Patient ambulated to bathroom to void and requested to return to bed.  Peri care performed. ? ?Bradd Burner, RN  ?

## 2022-04-23 NOTE — Op Note (Signed)
INDICATIONS: ?bacteremia ? ?PROCEDURE:  ? ?Informed consent was obtained prior to the procedure. The risks, benefits and alternatives for the procedure were discussed and the patient comprehended these risks.  Risks include, but are not limited to, cough, sore throat, vomiting, nausea, somnolence, esophageal and stomach trauma or perforation, bleeding, low blood pressure, aspiration, pneumonia, infection, trauma to the teeth and death.   ? ?After a procedural time-out, the oropharynx was anesthetized with 20% benzocaine spray.  ? ?During this procedure the patient was administered IV propofol by Anesthesiology, Dr. Glade Stanford. ? ?The transesophageal probe was inserted in the esophagus and stomach without difficulty and multiple views were obtained.  The patient was kept under observation until the patient left the procedure room.  The patient left the procedure room in stable condition.  ? ?Agitated microbubble saline contrast was not administered. ? ?COMPLICATIONS:   ? ?There were no immediate complications. ? ?FINDINGS:  ?No vegetations seen, no other signs of endocarditis. ? ?RECOMMENDATIONS:   ? ? No evidence of endocarditis. ? ?Time Spent Directly with the Patient: ? ?30 minutes  ? ?Susan Warren ?04/23/2022, 1:09 PM ? ?

## 2022-04-23 NOTE — Progress Notes (Signed)
?  Echocardiogram ?Echocardiogram Transesophageal has been performed. ? ?Delcie Roch ?04/23/2022, 1:21 PM ?

## 2022-04-23 NOTE — Progress Notes (Signed)
Patient picked up by [facility] for transport to [facility] for TEE.  Medical necessity and informed consent handed to [place]. ? ?Angie Fava, RN  ?

## 2022-04-23 NOTE — Transfer of Care (Signed)
Immediate Anesthesia Transfer of Care Note ? ?Patient: Susan Warren ? ?Procedure(s) Performed: TRANSESOPHAGEAL ECHOCARDIOGRAM (TEE) ? ?Patient Location: PACU and Endoscopy Unit ? ?Anesthesia Type:MAC ? ?Level of Consciousness: awake ? ?Airway & Oxygen Therapy: Patient Spontanous Breathing and Patient connected to nasal cannula oxygen ? ?Post-op Assessment: Report given to RN and Post -op Vital signs reviewed and stable ? ?Post vital signs: Reviewed and stable ? ?Last Vitals:  ?Vitals Value Taken Time  ?BP 125/51 04/23/22 1316  ?Temp 36.5 ?C 04/23/22 1316  ?Pulse 85 04/23/22 1320  ?Resp 24 04/23/22 1320  ?SpO2 94 % 04/23/22 1320  ?Vitals shown include unvalidated device data. ? ?Last Pain:  ?Vitals:  ? 04/23/22 1316  ?TempSrc: Other (Comment)  ?PainSc: 0-No pain  ?   ? ?Patients Stated Pain Goal: 3 (04/23/22 0910) ? ?Complications: No notable events documented. ?

## 2022-04-23 NOTE — Anesthesia Preprocedure Evaluation (Addendum)
Anesthesia Evaluation  ?Patient identified by MRN, date of birth, ID band ?Patient awake ? ? ? ?Reviewed: ?Allergy & Precautions, NPO status , Patient's Chart, lab work & pertinent test results ? ?History of Anesthesia Complications ?Negative for: history of anesthetic complications ? ?Airway ?Mallampati: II ? ?TM Distance: >3 FB ?Neck ROM: Full ? ? ? Dental ? ?(+) Dental Advisory Given ?  ?Pulmonary ?sleep apnea and Continuous Positive Airway Pressure Ventilation , PE (2020) ?  ?breath sounds clear to auscultation ? ? ? ? ? ? Cardiovascular ?hypertension, Pt. on medications ? ?Rhythm:Regular Rate:Normal ? ? ?  ?Neuro/Psych ? Headaches, Anxiety Depression   ? GI/Hepatic ?Neg liver ROS, GERD  ,  ?Endo/Other  ?Morbid obesity (BMI 82) ? Renal/GU ?negative Renal ROS  ?negative genitourinary ?  ?Musculoskeletal ?negative musculoskeletal ROS ?(+)  ? Abdominal ?  ?Peds ? Hematology ? ?(+) Blood dyscrasia, anemia ,   ?Anesthesia Other Findings ?bacteremia ? Reproductive/Obstetrics ?negative OB ROS ? ?  ? ? ? ? ? ? ? ? ? ? ? ? ? ?  ?  ? ? ? ? ? ? ? ?Anesthesia Physical ?Anesthesia Plan ? ?ASA: 4 ? ?Anesthesia Plan: MAC  ? ?Post-op Pain Management: Minimal or no pain anticipated  ? ?Induction: Intravenous ? ?PONV Risk Score and Plan: 2 and Treatment may vary due to age or medical condition and Propofol infusion ? ?Airway Management Planned: Natural Airway ? ?Additional Equipment: None ? ?Intra-op Plan:  ? ?Post-operative Plan:  ? ?Informed Consent: I have reviewed the patients History and Physical, chart, labs and discussed the procedure including the risks, benefits and alternatives for the proposed anesthesia with the patient or authorized representative who has indicated his/her understanding and acceptance.  ? ? ? ? ? ?Plan Discussed with:  ? ?Anesthesia Plan Comments:   ? ? ? ? ? ?Anesthesia Quick Evaluation ? ?

## 2022-04-23 NOTE — Progress Notes (Signed)
[facility] called for transport , was told 1-2 hours ?

## 2022-04-23 NOTE — Anesthesia Postprocedure Evaluation (Signed)
Anesthesia Post Note ? ?Patient: Susan Warren ? ?Procedure(s) Performed: TRANSESOPHAGEAL ECHOCARDIOGRAM (TEE) ? ?  ? ?Patient location during evaluation: PACU ?Anesthesia Type: MAC ?Level of consciousness: awake and alert ?Pain management: pain level controlled ?Vital Signs Assessment: post-procedure vital signs reviewed and stable ?Respiratory status: spontaneous breathing, nonlabored ventilation, respiratory function stable and patient connected to nasal cannula oxygen ?Cardiovascular status: stable and blood pressure returned to baseline ?Postop Assessment: no apparent nausea or vomiting ?Anesthetic complications: no ? ? ?No notable events documented. ? ?Last Vitals:  ?Vitals:  ? 04/23/22 1325 04/23/22 1335  ?BP: 118/65   ?Pulse: 86   ?Resp: (!) 25   ?Temp:    ?SpO2: 93% 94%  ?  ?Last Pain:  ?Vitals:  ? 04/23/22 1335  ?TempSrc:   ?PainSc: 0-No pain  ? ? ?  ?  ?  ?  ?  ?  ? ?Suzette Battiest E ? ? ? ? ?

## 2022-04-24 ENCOUNTER — Encounter (HOSPITAL_COMMUNITY): Payer: Self-pay | Admitting: Cardiovascular Disease

## 2022-04-24 DIAGNOSIS — R1084 Generalized abdominal pain: Secondary | ICD-10-CM | POA: Diagnosis not present

## 2022-04-24 LAB — RENAL FUNCTION PANEL
Albumin: 1.8 g/dL — ABNORMAL LOW (ref 3.5–5.0)
Anion gap: 8 (ref 5–15)
BUN: 9 mg/dL (ref 6–20)
CO2: 25 mmol/L (ref 22–32)
Calcium: 8.1 mg/dL — ABNORMAL LOW (ref 8.9–10.3)
Chloride: 101 mmol/L (ref 98–111)
Creatinine, Ser: 0.54 mg/dL (ref 0.44–1.00)
GFR, Estimated: >60 mL/min (ref >60)
Glucose, Bld: 115 mg/dL — ABNORMAL HIGH (ref 70–99)
Phosphorus: 4.5 mg/dL (ref 2.5–4.6)
Potassium: 4.9 mmol/L (ref 3.5–5.1)
Sodium: 134 mmol/L — ABNORMAL LOW (ref 135–145)

## 2022-04-24 LAB — ANTIFUNGAL AST 9 DRUG PANEL
Amphotericin B MIC: 0.5
Anidulafungin MIC: 2
Caspofungin MIC: 1
Fluconazole Islt MIC: 1
Flucytosine MIC: 0.12
Itraconazole MIC: 0.06
Micafungin MIC: 2
Posaconazole MIC: 0.03
Source: 183119

## 2022-04-24 LAB — PROTIME-INR
INR: 1.2 (ref 0.8–1.2)
Prothrombin Time: 14.9 seconds (ref 11.4–15.2)

## 2022-04-24 LAB — CBC
HCT: 34.7 % — ABNORMAL LOW (ref 36.0–46.0)
Hemoglobin: 10.6 g/dL — ABNORMAL LOW (ref 12.0–15.0)
MCH: 26.4 pg (ref 26.0–34.0)
MCHC: 30.5 g/dL (ref 30.0–36.0)
MCV: 86.3 fL (ref 80.0–100.0)
Platelets: 334 10*3/uL (ref 150–400)
RBC: 4.02 MIL/uL (ref 3.87–5.11)
RDW: 16.4 % — ABNORMAL HIGH (ref 11.5–15.5)
WBC: 10.2 10*3/uL (ref 4.0–10.5)
nRBC: 0.3 % — ABNORMAL HIGH (ref 0.0–0.2)

## 2022-04-24 LAB — CULTURE, BLOOD (ROUTINE X 2)
Culture: NO GROWTH
Culture: NO GROWTH
Special Requests: ADEQUATE
Special Requests: ADEQUATE

## 2022-04-24 LAB — CYTOLOGY - NON PAP

## 2022-04-24 LAB — MAGNESIUM: Magnesium: 1.7 mg/dL (ref 1.7–2.4)

## 2022-04-24 LAB — HEPARIN ANTI-XA: Heparin LMW: 0.46 IU/mL

## 2022-04-24 MED ORDER — WARFARIN SODIUM 2.5 MG PO TABS
2.5000 mg | ORAL_TABLET | Freq: Once | ORAL | Status: AC
  Administered 2022-04-24: 2.5 mg via ORAL
  Filled 2022-04-24: qty 1

## 2022-04-24 MED ORDER — FLUCONAZOLE 100 MG PO TABS
800.0000 mg | ORAL_TABLET | Freq: Every day | ORAL | Status: DC
  Administered 2022-04-26 – 2022-04-29 (×4): 800 mg via ORAL
  Filled 2022-04-24 (×4): qty 8

## 2022-04-24 MED ORDER — ALUM & MAG HYDROXIDE-SIMETH 200-200-20 MG/5ML PO SUSP
30.0000 mL | Freq: Four times a day (QID) | ORAL | Status: DC | PRN
  Administered 2022-04-24: 30 mL via ORAL
  Filled 2022-04-24: qty 30

## 2022-04-24 MED ORDER — FLUCONAZOLE 200 MG PO TABS
1200.0000 mg | ORAL_TABLET | Freq: Once | ORAL | Status: AC
  Administered 2022-04-25: 1200 mg via ORAL
  Filled 2022-04-24: qty 6

## 2022-04-24 MED ORDER — HYDROMORPHONE HCL 1 MG/ML IJ SOLN: 0.2500 mg | Freq: Once | INTRAMUSCULAR | Status: DC | PRN

## 2022-04-24 MED ORDER — MELATONIN 5 MG PO TABS
5.0000 mg | ORAL_TABLET | Freq: Once | ORAL | Status: AC
  Administered 2022-04-24: 5 mg via ORAL
  Filled 2022-04-24: qty 1

## 2022-04-24 MED ORDER — AMOXICILLIN-POT CLAVULANATE 875-125 MG PO TABS
1.0000 | ORAL_TABLET | Freq: Two times a day (BID) | ORAL | Status: DC
  Administered 2022-04-24 – 2022-04-29 (×10): 1 via ORAL
  Filled 2022-04-24 (×10): qty 1

## 2022-04-24 NOTE — Progress Notes (Signed)
Skin care completed as ordered.  ?

## 2022-04-24 NOTE — Progress Notes (Addendum)
ANTICOAGULATION CONSULT NOTE - Follow Up Consult ? ?Pharmacy Consult for enoxaparin, warfarin  ?Indication: h/o PE ? ?Allergies  ?Allergen Reactions  ? Adhesive [Tape] Anaphylaxis and Swelling  ?  *Adhesive Spray*   ? ? ?Patient Measurements: ?Height: 5\' 4"  (162.6 cm) ?Weight: (!) 217.7 kg (480 lb) ?IBW/kg (Calculated) : 54.7 ?Heparin Dosing Weight: 113 kg ? ?Vital Signs: ?Temp: 99 ?F (37.2 ?C) (05/03 1233) ?Temp Source: Oral (05/03 1233) ?BP: 122/72 (05/03 1233) ?Pulse Rate: 91 (05/03 1233) ? ?Labs: ?Recent Labs  ?  04/21/22 ?1559 04/22/22 ?0025 04/22/22 ?0025 04/23/22 ?[id number] 04/24/22 ?[id number] 04/24/22 ?[id number] 04/24/22 ?1011  ?HGB  --  10.1*   < > 10.3* 10.6*  --   --   ?HCT  --  33.9*  --  34.2* 34.7*  --   --   ?PLT  --  483*  --  432* 334  --   --   ?LABPROT  --  17.1*  --  15.6* 14.9  --   --   ?INR  --  1.4*  --  1.3* 1.2  --   --   ?HEPARINUNFRC <0.10* 0.14*  --   --   --   --   --   ?HEPRLOWMOCWT  --   --   --   --   --   --  0.46  ?CREATININE  --  0.62  --  0.47  --  0.54  --   ? < > = values in this interval not displayed.  ? ? ? ?Estimated Creatinine Clearance: 162.8 mL/min (by C-G formula based on SCr of 0.54 mg/dL). ? ?Assessment: ?AC/Heme: PTA warfarin for hx PE on hold for high INR (5.7 on admit) and possible cyst aspiration (INR must be <2.5)  ?- IV Vit K 5mg  given 4/28 ?- Stop heparin ?- Pt was transitioned to enoxaparin 220 mg SQ q12h  ?- Notified by RN that IV pumps have a hard stop of upper limit for heparin gtt @ 39.9999 ml/hr, so increasing rate to 45 ml/hr is not possible ?  ?Contacted Dr Sidney Ace who provided verbal orders to change IV heparin to Lovenox ? ?Pt warfarin regimen is warfarin 5 mg daily except 2.5 mg on Monday and Friday. ? ? ?Today, 04/24/22  ?INR 1.2, subtherapeutic after single dose given  ?[place] is 0.46. Target range is 0.6 to 1. Times of administration were slightly off as pt was transported back and forth for TEE  ?Hgb 10.6 low but stable ?Plt 334  ?TEE completed 5/2  ?DDI: new start  fluconazole, changed from micafungin ? ? ?Goal of Therapy:  ?Heparin level 0.3-0.7 units/ml ?Monitor platelets by anticoagulation protocol: Yes ?  ?Plan:  ?Continue enoxaparin 220 mg SQ q12h  ?Warfarin 2.5 mg PO x 1 due to high doses of fluconazole started today  ?Will monitor INR changes closely since fluconazole started. ?Monitor daily INR, CBC ? ? ? ?Royetta Asal, PharmD, BCPS ?04/24/2022 1:33 PM ? ? ? ?

## 2022-04-24 NOTE — Progress Notes (Signed)
?PROGRESS NOTE ?Susan Warren  LDJ:[id number] DOB: August 05, 1976 DOA: 04/15/2022 ?PCP: Storm Frisk, MD  ? ?Brief Narrative/Hospital Course: ?46 year old F with PMH of DVT/PE on warfarin, morbid obesity, lymphedema, anxiety and depression presenting with nausea, vomiting and abdominal pain, and admitted for abdominal pain.  CT abdomen and pelvis showed interval progression of the large cystic mass in the right abdomen and pelvis measuring about 24 x 24 x 24 cm and hepatic cirrhosis but no acute finding.  She had leukocytosis to 17.6.  INR was supratherapeutic.  She was started on IV Zosyn.  IR consulted and planning drainage once INR allows. ? ?Blood culture with Candida parapsilosis in 1 out of 2 bottles.  ID consulted and started micafungin in addition to IV Zosyn.  TTE without significant finding of vegetation.  ID recommending TEE.  Repeat blood culture on 4/28 NGTD. ? ?INR reversed with FFP and vitamin K.  She had US guided aspiration of large abdominal cystic mass that yielded 7.2 L brown debris-filled cystic fluid on 5/1.  Fluid study pending.  TEE on 5/2 without vegetation. ?Restarted warfarin with Lovenox bridge.  ID following ? ? ?  ?  ?Subjective: ?Seen and examined this morning complains of some epigastric discomfort ?Overnight afebrile BP stable on room air ?Labs this morning with resolved leukocytosis stable CMP ? ?Assessment and Plan: ?Principal Problem: ?  Abdominal pain due to right abdominal and pelvic cystic mass ?Active Problems: ?  Candidemia (HCC) ?  Supratherapeutic INR ?  History of pulmonary embolism ?  Right leg DVT (HCC) ?  Morbid obesity with body mass index of 70 and over in adult [facility]) ?  Normocytic anemia ?  Leukocytosis ?  Hypertension ?  GERD (gastroesophageal reflux disease) ?  Lymphedema of both lower extremities ?  Right abdominal and pelvic cystic mass ?  Hyponatremia ?  Vitamin B12 deficiency ?  Herpes simplex labialis ?  Hypomagnesemia ?  ?Abdominal pain due to right abdominal  and pelvic cystic mass: Unclear origin of the mass, pain due to progressive intra-abdominal and pelvic large cystic mass ?Likely due to progressive intra-abdominal and pelvic large cystic mass as noted on previous CTs measuring 18.5 x 16.3 cm (05/08/2017) > 19 x 17 cm ( 04/12/2019)> 24.2 x 24.3 x 24.6 cm ( 04/16/22).  Unclear etiology.  Ultrasound-guided aspiration by IR yielded 7.2 L of brown debris-filled cystic fluid-some delay in fluid analysis, Gram stain negative culture pending but it's after the antibiotics  of of several days.  Cytology pending. ?BCID and 1 out of 2 blood culture with Candidia Parapsilosis.  Repeat blood culture 4/28 NGTD. ?CA125 slightly elevated to 65.5 but difficult to interpret in the setting of possible acute infection per gyn/onc.   ?Clinically much improved leukocytosis resolved, no nausea vomiting but has some abdominal discomfort this morning.  If continues to have pain we will consider repeating CT or ultrasound.  Continue micafungin and Zosyn, ID following closely, continue pain control Tylenol/oxy/Dilaudid as needed and bowel regimen.  Surgery recommending outpatient follow-up. ? ?Candidemia:BCID and blood culture with Candida parpasilosis in 1 out of 2 bottles.  She has no visual change.  Repeat blood cultures NGTD.  TTE and TEE without vegetation.  Continue micafungin as per ID.  Appreciate input discussed this morning. ? ?Supratherapeutic INR:INR 5.9>> 1.3 after FFP x2 and IV vitamin K 5 mg.  Now on Lovenox bridge, pharmacy managing Coumadin. ?Recent Labs  ?Lab 04/20/22 ?0420 04/21/22 ?0718 04/22/22 ?0025 04/23/22 ?0300 04/24/22 ?0434  ?INR 1.5*  1.3* 1.4* 1.3* 1.2  ?  ?Leukocytosis:Resolved ? ?Normocytic anemia:Due to chronic disease and B12 deficiency, continue B12 supplement monitor H&H ?Recent Labs  ?Lab 04/19/22 ?0439 04/21/22 ?0718 04/22/22 ?0025 04/23/22 ?[id number] 04/24/22 ?0434  ?HGB 10.0* 10.5* 10.1* 10.3* 10.6*  ?HCT 33.5* 35.5* 33.9* 34.2* 34.7*  ?  ?Right leg DVT ?History  of pulmonary embolism: ?Continue Lovenox bridge/Coumadin, pharmacy dosing.  ? ?Hypomagnesemia:Repleted ? ?Herpes simplex labialis:Blisters right upper lip.  Reports history of cold sore.Received p.o. Valtrex 2 g x 2 ? ?Hyponatremia resolved ?GERD continue PPI ?Hypertension blood pressure stable on home meds ?Morbid obesity:Patient's Body mass index is 82.39 kg/m?. : Will benefit with PCP follow-up, weight loss  healthy lifestyle and outpatient sleep evaluation. ? ?DVT prophylaxis: lovenox/warfarin ?Code Status:   Code Status: Full Code ?Family Communication: plan of care discussed with patient at bedside. ?Patient status is: Inpatient because of ongoing management with IV antibiotics antifungal and abdominal pain monitoring ?Level of care: Med-Surg  ? ?Dispo: The patient is from: home ?           Anticipated disposition: home ? ?Mobility Assessment (last 72 hours)   ? ? Mobility Assessment   ? ? Row Name 04/24/22 0803 04/23/22 1943 04/23/22 0944 04/23/22 0925 04/22/22 1200  ? Does patient have an order for bedrest or is patient medically unstable No - Continue assessment No - Continue assessment No - Continue assessment No - Continue assessment No - Continue assessment  ? What is the highest level of mobility based on the progressive mobility assessment? Level 5 (Walks with assist in room/hall) - Balance while stepping forward/back and can walk in room with assist - Complete Level 5 (Walks with assist in room/hall) - Balance while stepping forward/back and can walk in room with assist - Complete Level 5 (Walks with assist in room/hall) - Balance while stepping forward/back and can walk in room with assist - Complete Level 5 (Walks with assist in room/hall) - Balance while stepping forward/back and can walk in room with assist - Complete Level 5 (Walks with assist in room/hall) - Balance while stepping forward/back and can walk in room with assist - Complete  ? ?  ?  ? ?  ?  ? ?Objective: ?Vitals last 24  hrs: ?Vitals:  ? 04/23/22 1923 04/23/22 2336 04/24/22 0510 04/24/22 1139  ?BP: (!) 136/57 140/64 107/75 122/70  ?Pulse: 89 93 89 91  ?Resp: 16 14 16 18   ?Temp: 97.8 ?F (36.6 ?C) 98.2 ?F (36.8 ?C) 98.2 ?F (36.8 ?C) 97.9 ?F (36.6 ?C)  ?TempSrc: Oral Oral Oral Oral  ?SpO2: 100% 94% 95% 97%  ?Weight:      ?Height:      ? ?Weight change:  ? ?Physical Examination: ?General exam: alert awake, oriented, obese older than stated age, weak appearing. ?HEENT:Oral mucosa moist, Ear/Nose WNL grossly, dentition normal. ?Respiratory system: bilaterally diminished BS, no use of accessory muscle ?Cardiovascular system: S1 & S2 +, No JVD. ?Gastrointestinal system: Abdomen soft, obese, mildly tender epigastrium,ND, BS+ ?Nervous System:Alert, awake, moving extremities and grossly nonfocal ?Extremities: LE edema none,distal peripheral pulses palpable.  ?Skin: No rashes,no icterus. ?MSK: Normal muscle bulk,tone, power ? ?Medications reviewed:  ?Scheduled Meds: ? (feeding supplement) PROSource Plus  30 mL Oral BID BM  ? amLODipine  10 mg Oral q1800  ? chlorhexidine  15 mL Mouth Rinse BID  ? enoxaparin (LOVENOX) injection  220 mg Subcutaneous Q12H  ? mouth rinse  15 mL Mouth Rinse q12n4p  ? pantoprazole  40 mg  Oral Daily  ? Ensure Max Protein  11 oz Oral BID  ? senna-docusate  1 tablet Oral Daily  ? vitamin B-12  1,000 mcg Oral Daily  ? Warfarin - Pharmacist Dosing Inpatient   Does not apply q1600  ? ?Continuous Infusions: ? micafungin [facility]) IV 200 mg (04/24/22 0912)  ? ondansetron (ZOFRAN) IV    ? piperacillin-tazobactam (ZOSYN)  IV 3.375 g (04/24/22 1026)  ? ? ?  ?Diet Order   ? ?       ?  Diet Heart Room service appropriate? Yes; Fluid consistency: Thin  Diet effective now       ?  ? ?  ?  ? ?  ?  ? ?Nutrition Problem: Inadequate oral intake ?Etiology: acute illness, nausea, decreased appetite ?Signs/Symptoms: per patient/family report ?Interventions: Premier Protein, Prostat ? ? ?Intake/Output Summary (Last 24 hours) at 04/24/2022  1141 ?Last data filed at 04/24/2022 0800 ?Gross per 24 hour  ?Intake 522.01 ml  ?Output 3000 ml  ?Net -2477.99 ml  ? ?Net IO Since Admission: -11,312.52 mL [04/24/22 1141]  ?Wt Readings from Last 3 Encounters:  ?04

## 2022-04-24 NOTE — Progress Notes (Signed)
? ? ?[facility] ? ?Date of Admission:  04/15/2022    ? ?Total days of antibiotics 10 ?        ?ASSESSMENT: ? ?Susan Warren has increased nausea and right upper quadrant pain today. No organisms seen on gram stain and no growth on cultures of aspirated fluid. Temperature mildly elevated with no fevers currently. Suspect source control has been achieved with aspiration. Discussed plan of care to include Augmentin and Fluconazole for a total of 14 days. Addressed concerns about re-accumulation of fluid being a possibility that cannot be ruled out in the future. Will need ophthalmology exam in setting of fungemia to rule out endophthalmitis which can be done as outpatient. Will continue to monitor cultures and adjust antibiotics as appropriate. If symptoms worsen or continue will re-image. Remaining medical and supportive care per Primary Team.  ? ?PLAN: ? ?Change antibiotics to Augmentin and Fluconazole.  ? Monitor cultures for organism and adjust antibiotics as needed.  ?Will need ophthalmology exam in setting of fungemia to rule out endophthalmitis which can be done as outpatient.  ? Remaining medical and supportive care per primary team.   ? ?Principal Problem: ?  Abdominal pain due to right abdominal and pelvic cystic mass ?Active Problems: ?  Hypertension ?  GERD (gastroesophageal reflux disease) ?  History of pulmonary embolism ?  Right leg DVT (HCC) ?  Lymphedema of both lower extremities ?  Morbid obesity with body mass index of 70 and over in adult [facility]) ?  Right abdominal and pelvic cystic mass ?  Supratherapeutic INR ?  Hyponatremia ?  Normocytic anemia ?  Leukocytosis ?  Candidemia (HCC) ?  Vitamin B12 deficiency ?  Herpes simplex labialis ?  Hypomagnesemia ? ? ? (feeding supplement) PROSource Plus  30 mL Oral BID BM  ? amLODipine  10 mg Oral q1800  ? amoxicillin-clavulanate  1 tablet Oral Q12H  ? chlorhexidine  15 mL Mouth Rinse BID  ? enoxaparin (LOVENOX) injection  220 mg  Subcutaneous Q12H  ? [START ON 04/25/2022] fluconazole  1,200 mg Oral Once  ? Followed by  ? [START ON 04/26/2022] fluconazole  800 mg Oral Daily  ? mouth rinse  15 mL Mouth Rinse q12n4p  ? pantoprazole  40 mg Oral Daily  ? Ensure Max Protein  11 oz Oral BID  ? senna-docusate  1 tablet Oral Daily  ? vitamin B-12  1,000 mcg Oral Daily  ? Warfarin - Pharmacist Dosing Inpatient   Does not apply q1600  ? ? ?SUBJECTIVE: ? ?Afebrile overnight with no acute events. Having increased nausea and right upper quadrant pain today with feelings of malaise. Has concerns about fluids re-collecting.  ? ?Allergies  ?Allergen Reactions  ? Adhesive [Tape] Anaphylaxis and Swelling  ?  *Adhesive Spray*   ? ? ? ?Review of Systems: ?Review of Systems  ?Constitutional:  Negative for chills, fever and weight loss.  ?Respiratory:  Negative for cough, shortness of breath and wheezing.   ?Cardiovascular:  Negative for chest pain and leg swelling.  ?Gastrointestinal:  Positive for abdominal pain and nausea. Negative for constipation, diarrhea and vomiting.  ?Skin:  Negative for rash.  ? ? ? ?OBJECTIVE: ?Vitals:  ? 04/23/22 2336 04/24/22 0510 04/24/22 1139 04/24/22 1233  ?BP: 140/64 107/75 122/70 122/72  ?Pulse: 93 89 91 91  ?Resp: 14 16 18  (!) 22  ?Temp: 98.2 ?F (36.8 ?C) 98.2 ?F (36.8 ?C) 97.9 ?F (36.6 ?C) 99 ?F (37.2 ?C)  ?TempSrc: Oral Oral Oral  Oral  ?SpO2: 94% 95% 97% 100%  ?Weight:      ?Height:      ? ?Body mass index is 82.39 kg/m?. ? ?Physical Exam ?Constitutional:   ?   General: She is not in acute distress. ?   Appearance: She is obese.  ?Cardiovascular:  ?   Rate and Rhythm: Normal rate and regular rhythm.  ?   Heart sounds: Normal heart sounds.  ?Pulmonary:  ?   Effort: Pulmonary effort is normal.  ?   Breath sounds: Normal breath sounds.  ?Skin: ?   General: Skin is warm and dry.  ?Neurological:  ?   Mental Status: She is alert and oriented to person, place, and time.  ?Psychiatric:     ?   Behavior: Behavior normal.     ?   Thought  Content: Thought content normal.     ?   Judgment: Judgment normal.  ? ? ?Lab Results ?Lab Results  ?Component Value Date  ? WBC 10.2 04/24/2022  ? HGB 10.6 (L) 04/24/2022  ? HCT 34.7 (L) 04/24/2022  ? MCV 86.3 04/24/2022  ? PLT 334 04/24/2022  ?  ?Lab Results  ?Component Value Date  ? CREATININE 0.54 04/24/2022  ? BUN 9 04/24/2022  ? NA 134 (L) 04/24/2022  ? K 4.9 04/24/2022  ? CL 101 04/24/2022  ? CO2 25 04/24/2022  ?  ?Lab Results  ?Component Value Date  ? ALT 9 04/22/2022  ? AST 15 04/22/2022  ? ALKPHOS 90 04/22/2022  ? BILITOT 0.8 04/22/2022  ?  ? ?Microbiology: ?Recent Results (from the past 240 hour(s))  ?Blood culture (routine x 2)     Status: Abnormal (Preliminary result)  ? Collection Time: 04/16/22  3:57 PM  ? Specimen: BLOOD  ?Result Value Ref Range Status  ? Specimen Description   Final  ?  BLOOD BLOOD RIGHT HAND ?Performed at [facility], [address] [place], [place] [address] ?  ? Special Requests   Final  ?  BOTTLES DRAWN AEROBIC ONLY Blood Culture adequate volume ?Performed at [facility], [address] [place], [place] [address] ?  ? Culture  Setup Time (A)  Final  ?  YEAST ?AEROBIC BOTTLE ONLY ?CRITICAL RESULT CALLED TO, READ BACK BY AND VERIFIED WITH: PHARMD M BELL [phone] AT 1326 BY CM ?  ? Culture (A)  Final  ?  CANDIDA PARAPSILOSIS ?Sent to [facility] for further susceptibility testing. ?Performed at [facility], [address] [place], [place] [address] ?  ? Report Status PENDING  Incomplete  ?Blood Culture ID Panel (Reflexed)     Status: Abnormal  ? Collection Time: 04/16/22  3:57 PM  ?Result Value Ref Range Status  ? Enterococcus faecalis NOT DETECTED NOT DETECTED Final  ? Enterococcus Faecium NOT DETECTED NOT DETECTED Final  ? Listeria monocytogenes NOT DETECTED NOT DETECTED Final  ? Staphylococcus species NOT DETECTED NOT DETECTED Final  ? Staphylococcus aureus (BCID) NOT DETECTED NOT DETECTED Final  ? Staphylococcus epidermidis NOT  DETECTED NOT DETECTED Final  ? Staphylococcus lugdunensis NOT DETECTED NOT DETECTED Final  ? Streptococcus species NOT DETECTED NOT DETECTED Final  ? Streptococcus agalactiae NOT DETECTED NOT DETECTED Final  ? Streptococcus pneumoniae NOT DETECTED NOT DETECTED Final  ? Streptococcus pyogenes NOT DETECTED NOT DETECTED Final  ? A.calcoaceticus-baumannii NOT DETECTED NOT DETECTED Final  ? Bacteroides fragilis NOT DETECTED NOT DETECTED Final  ? Enterobacterales NOT DETECTED NOT DETECTED Final  ? Enterobacter cloacae complex NOT  DETECTED NOT DETECTED Final  ? Escherichia coli NOT DETECTED NOT DETECTED Final  ? Klebsiella aerogenes NOT DETECTED NOT DETECTED Final  ? Klebsiella oxytoca NOT DETECTED NOT DETECTED Final  ? Klebsiella pneumoniae NOT DETECTED NOT DETECTED Final  ? Proteus species NOT DETECTED NOT DETECTED Final  ? Salmonella species NOT DETECTED NOT DETECTED Final  ? Serratia marcescens NOT DETECTED NOT DETECTED Final  ? Haemophilus influenzae NOT DETECTED NOT DETECTED Final  ? Neisseria meningitidis NOT DETECTED NOT DETECTED Final  ? Pseudomonas aeruginosa NOT DETECTED NOT DETECTED Final  ? Stenotrophomonas maltophilia NOT DETECTED NOT DETECTED Final  ? Candida albicans NOT DETECTED NOT DETECTED Final  ? Candida auris NOT DETECTED NOT DETECTED Final  ? Candida glabrata NOT DETECTED NOT DETECTED Final  ? Candida krusei NOT DETECTED NOT DETECTED Final  ? Candida parapsilosis DETECTED (A) NOT DETECTED Final  ?  Comment: CRITICAL RESULT CALLED TO, READ BACK BY AND VERIFIED WITH: ?PHARMD M BELL [id number] AT 1326 BY CM ?  ? Candida tropicalis NOT DETECTED NOT DETECTED Final  ? Cryptococcus neoformans/gattii NOT DETECTED NOT DETECTED Final  ?  Comment: Performed at [facility], [address], [place], [place] [address]  ?Blood culture (routine x 2)     Status: None  ? Collection Time: 04/16/22  4:14 PM  ? Specimen: BLOOD  ?Result Value Ref Range Status  ? Specimen Description   Final  ?  BLOOD RIGHT  ANTECUBITAL ?Performed at [facility], [address], [place], [place] [address] ?  ? Special Requests   Final  ?  BOTTLES DRAWN AEROBIC ONLY Blood Culture adequate volume ?Performed at [facility]

## 2022-04-25 ENCOUNTER — Other Ambulatory Visit (HOSPITAL_COMMUNITY): Payer: Self-pay

## 2022-04-25 LAB — CBC
HCT: 33.9 % — ABNORMAL LOW (ref 36.0–46.0)
Hemoglobin: 10.4 g/dL — ABNORMAL LOW (ref 12.0–15.0)
MCH: 27.4 pg (ref 26.0–34.0)
MCHC: 30.7 g/dL (ref 30.0–36.0)
MCV: 89.4 fL (ref 80.0–100.0)
Platelets: 380 10*3/uL (ref 150–400)
RBC: 3.79 MIL/uL — ABNORMAL LOW (ref 3.87–5.11)
RDW: 16.5 % — ABNORMAL HIGH (ref 11.5–15.5)
WBC: 9.3 10*3/uL (ref 4.0–10.5)
nRBC: 0 % (ref 0.0–0.2)

## 2022-04-25 LAB — PROTIME-INR
INR: 1.2 (ref 0.8–1.2)
Prothrombin Time: 15.5 seconds — ABNORMAL HIGH (ref 11.4–15.2)

## 2022-04-25 MED ORDER — AMOXICILLIN-POT CLAVULANATE 875-125 MG PO TABS
1.0000 | ORAL_TABLET | Freq: Two times a day (BID) | ORAL | 0 refills | Status: DC
  Filled 2022-04-25: qty 20, 10d supply, fill #0

## 2022-04-25 MED ORDER — WARFARIN SODIUM 2.5 MG PO TABS
2.5000 mg | ORAL_TABLET | Freq: Once | ORAL | Status: AC
  Administered 2022-04-25: 2.5 mg via ORAL
  Filled 2022-04-25: qty 1

## 2022-04-25 MED ORDER — FLUCONAZOLE 200 MG PO TABS
800.0000 mg | ORAL_TABLET | Freq: Every day | ORAL | 0 refills | Status: DC
  Filled 2022-04-25: qty 40, 10d supply, fill #0

## 2022-04-25 NOTE — Progress Notes (Signed)
ANTICOAGULATION CONSULT NOTE - Follow Up Consult ? ?Pharmacy Consult for enoxaparin, warfarin  ?Indication: h/o PE ? ?Allergies  ?Allergen Reactions  ? Adhesive [Tape] Anaphylaxis and Swelling  ?  *Adhesive Spray*   ? ? ?Patient Measurements: ?Height: 5\' 4"  (162.6 cm) ?Weight: (!) 217.7 kg (480 lb) ?IBW/kg (Calculated) : 54.7 ?Heparin Dosing Weight: 113 kg ? ?Vital Signs: ?Temp: 98 ?F (36.7 ?C) (05/04 0601) ?Temp Source: Oral (05/04 0601) ?BP: 114/69 (05/04 0601) ?Pulse Rate: 86 (05/04 0601) ? ?Labs: ?Recent Labs  ?  04/23/22 ?0452 04/24/22 ?0434 04/24/22 ?0436 04/24/22 ?1011 04/25/22 ?0454  ?HGB 10.3* 10.6*  --   --  10.4*  ?HCT 34.2* 34.7*  --   --  33.9*  ?PLT 432* 334  --   --  380  ?LABPROT 15.6* 14.9  --   --  15.5*  ?INR 1.3* 1.2  --   --  1.2  ?HEPRLOWMOCWT  --   --   --  0.46  --   ?CREATININE 0.47  --  0.54  --   --   ? ? ? ?Estimated Creatinine Clearance: 162.8 mL/min (by C-G formula based on SCr of 0.54 mg/dL). ? ?Assessment: ?AC/Heme: PTA warfarin for hx PE on hold for high INR (5.7 on admit) and possible cyst aspiration (INR must be <2.5)  ?- IV Vit K 5mg  given 4/28 ?- Stop heparin ?- Pt was transitioned to enoxaparin 220 mg SQ q12h  ?- Notified by RN that IV pumps have a hard stop of upper limit for heparin gtt @ 39.9999 ml/hr, so increasing rate to 45 ml/hr is not possible ? ? ?Pt warfarin regimen is warfarin 5 mg daily except 2.5 mg on Monday and Friday. ? ? ?Today, 04/25/22  ?INR 1.2, subtherapeutic  ?LWMH is 0.46. Target range is 0.6 to 1. Times of administration were slightly off as pt was transported back and forth for TEE  ?Hgb 10.4 low but stable ?Plt 380  ?TEE completed 5/2  ?DDI: new start fluconazole 1200 mg PO x1 then 800 mg PO daily , changed from micafungin ? ? ?Goal of Therapy:  ?Heparin level 0.3-0.7 units/ml ?Monitor platelets by anticoagulation protocol: Yes ?  ?Plan:  ?Continue enoxaparin 220 mg SQ q12h  ?Warfarin 2.5 mg PO x 1 due to high doses of fluconazole.   ?Will monitor INR  changes closely since fluconazole started. ?Monitor daily INR, CBC ? ? ? ?06/25/22, PharmD, BCPS ?04/25/2022 11:50 AM ? ? ? ?

## 2022-04-25 NOTE — Progress Notes (Signed)
?PROGRESS NOTE ?Susan Warren  [id number] DOB: November 01, 1976 DOA: 04/15/2022 ?PCP: Elsie Stain, MD  ? ?Brief Narrative/Hospital Course: ?46 year old F with PMH of DVT/PE on warfarin, morbid obesity, lymphedema, anxiety and depression presenting with nausea, vomiting and abdominal pain, and admitted for abdominal pain.  CT abdomen and pelvis showed interval progression of the large cystic mass in the right abdomen and pelvis measuring about 24 x 24 x 24 cm and hepatic cirrhosis but no acute finding.  She had leukocytosis to 17.6.  INR was supratherapeutic.  She was started on IV Zosyn.  IR consulted and planning drainage once INR allows. ? ?Blood culture with Candida parapsilosis in 1 out of 2 bottles.  ID consulted and started micafungin in addition to IV Zosyn.  TTE without significant finding of vegetation.  ID recommending TEE.  Repeat blood culture on 4/28 NGTD. ? ?INR reversed with FFP and vitamin K.  She had US guided aspiration of large abdominal cystic mass that yielded 7.2 L brown debris-filled cystic fluid on 5/1.  Fluid study pending.  TEE on 5/2 without vegetation. ?Restarted warfarin with Lovenox bridge.  ID following-patient was transitioned to oral antibiotic and Diflucan 5/3 ?  ?  ?Subjective: ?Seen and examined this morning.  Resting comfortably reports her abdominal pain about the same and not worsening.  Endorses pain is started yesterday. ?Remains afebrile and leukocytosis has resolved. ? ?Assessment and Plan: ?Principal Problem: ?  Abdominal pain due to right abdominal and pelvic cystic mass ?Active Problems: ?  Candidemia ([place]) ?  Supratherapeutic INR ?  History of pulmonary embolism ?  Right leg DVT ([place]) ?  Morbid obesity with body mass index of 70 and over in adult [facility]) ?  Normocytic anemia ?  Leukocytosis ?  Hypertension ?  GERD (gastroesophageal reflux disease) ?  Lymphedema of both lower extremities ?  Right abdominal and pelvic cystic mass ?  Hyponatremia ?  Vitamin B12  deficiency ?  Herpes simplex labialis ?  Hypomagnesemia ?  ?Abdominal pain due to right abdominal and pelvic cystic mass/abscess: ?Cystic mass has been progressively worsening since 2018- was noted on previous CTs measuring 18.5 x 16.3 cm (05/08/2017) > 19 x 17 cm ( 04/12/2019)> 24.2 x 24.3 x 24.6 cm ( 04/16/22).  Unclear etiology.  Ultrasound-guided aspiration by IR yielded 7.2 L of brown debris-filled cystic fluid-some delay in fluid analysis, Gram stain negative culture no growth so far cytology/pathology- no malignant cells but numerous acute inflammatory cells and necrotic debris compatible with abscess ?BCID and 1 out of 2 blood culture with Candidia Parapsilosis.  Repeat blood culture 4/28 NGTD. ?CA125 slightly elevated to 65.5 but difficult to interpret in the setting of possible acute infection per gyn/onc.   ?Clinically much improved leukocytosis resolved, no nausea vomiting but has some abdominal discomfort this morning.  If continues to have pain we will consider repeating CT or ultrasound.  Micafungin and Zosyn transition to Augmentin and fluconazole 5/3 and complete 14 days course and follow-up with ID/PCP. Surgery recommending outpatient follow-up. ? ?Candidemia:BCID and blood culture with Candida parpasilosis in 1 out of 2 bottles.  She has no visual change.  Repeat blood cultures NGTD.  TTE and TEE without vegetation. micafungin converted to Diflucan to complete 14 days course.   ? ?Supratherapeutic INR:INR 5.9>> 1.3 after FFP x2 and IV vitamin K 5 mg.  Now on Lovenox bridge, pharmacy managing Coumadin.  Patient unable to use Lovenox at home. ?Recent Labs  ?Lab 04/21/22 ?0718 04/22/22 ?0025 04/23/22 ?[id number]  04/24/22 ?[id number] 04/25/22 ?0454  ?INR 1.3* 1.4* 1.3* 1.2 1.2  ?  ?Leukocytosis:Resolved ? ?Normocytic anemia:Due to chronic disease and B12 deficiency, continue B12 supplement monitor H&H ?Recent Labs  ?Lab 04/21/22 ?0718 04/22/22 ?0025 04/23/22 ?[id number] 04/24/22 ?[id number] 04/25/22 ?[id number]  ?HGB 10.5* 10.1*  10.3* 10.6* 10.4*  ?HCT 35.5* 33.9* 34.2* 34.7* 33.9*  ?  ?Right leg DVT ?History of pulmonary embolism: ?Continue Lovenox bridge/Coumadin, pharmacy dosing.  ? ?Hypomagnesemia:Repleted ? ?Herpes simplex labialis:Blisters right upper lip.  Reports history of cold sore.Received p.o. Valtrex 2 g x 2 ? ?Hyponatremia resolved ?GERD continue PPI ?Hypertension blood pressure stable on home meds ?Morbid obesity:Patient's Body mass index is 82.39 kg/m?. : Will benefit with PCP follow-up, weight loss  healthy lifestyle and outpatient sleep evaluation. ? ?DVT prophylaxis: lovenox/warfarin ?Code Status:   Code Status: Full Code ?Family Communication: plan of care discussed with patient at bedside. ?Patient status is: Inpatient because of ongoing management with IV antibiotics antifungal and abdominal pain monitoring ?Level of care: Med-Surg  ? ?Dispo: The patient is from: home ?           Anticipated disposition: home once INR therapeutic ? ?Mobility Assessment (last 72 hours)   ? ? Mobility Assessment   ? ? [place] Name 04/25/22 1025 04/24/22 2000 04/24/22 0803 04/23/22 1943 04/23/22 0944  ? Does patient have an order for bedrest or is patient medically unstable No - Continue assessment No - Continue assessment No - Continue assessment No - Continue assessment No - Continue assessment  ? What is the highest level of mobility based on the progressive mobility assessment? Level 5 (Walks with assist in room/hall) - Balance while stepping forward/back and can walk in room with assist - Complete Level 5 (Walks with assist in room/hall) - Balance while stepping forward/back and can walk in room with assist - Complete Level 5 (Walks with assist in room/hall) - Balance while stepping forward/back and can walk in room with assist - Complete Level 5 (Walks with assist in room/hall) - Balance while stepping forward/back and can walk in room with assist - Complete Level 5 (Walks with assist in room/hall) - Balance while stepping forward/back  and can walk in room with assist - Complete  ? ? [place] Name 04/23/22 [id number]  ?  ?  ?  ?  ? Does patient have an order for bedrest or is patient medically unstable No - Continue assessment      ? What is the highest level of mobility based on the progressive mobility assessment? Level 5 (Walks with assist in room/hall) - Balance while stepping forward/back and can walk in room with assist - Complete      ? ?  ?  ? ?  ?  ? ?Objective: ?Vitals last 24 hrs: ?Vitals:  ? 04/24/22 1139 04/24/22 1233 04/24/22 2041 04/25/22 0601  ?BP: 122/70 122/72 135/73 114/69  ?Pulse: 91 91 90 86  ?Resp: 18 (!) 22 16 16   ?Temp: 97.9 ?F (36.6 ?C) 99 ?F (37.2 ?C) 98.2 ?F (36.8 ?C) 98 ?F (36.7 ?C)  ?TempSrc: Oral Oral Oral Oral  ?SpO2: 97% 100% 94% 92%  ?Weight:      ?Height:      ? ?Weight change:  ? ?Physical Examination: ?general exam:Oriented, pleasant, morbidly obese AA,older than stated age, weak appearing. ?HEENT:Oral mucosa moist, Ear/Nose WNL grossly, dentition normal. ?Respiratory system: bilaterally diminished,no use of accessory muscle ?Cardiovascular system: S1 & S2 +, No JVD,. ?Gastrointestinal system: Abdomen soft,obese, mildly tender epigastrium right upper abdomen  NT,ND, BS+ ?Nervous System:Alert, awake, moving extremities and grossly nonfocal ?Extremities: edema LE chronic,distal peripheral pulses palpable.  ?Skin: No rashes,no icterus. ?MSK: Normal muscle bulk,tone, power ? ?Medications reviewed:  ?Scheduled Meds: ? (feeding supplement) PROSource Plus  30 mL Oral BID BM  ? amLODipine  10 mg Oral q1800  ? amoxicillin-clavulanate  1 tablet Oral Q12H  ? chlorhexidine  15 mL Mouth Rinse BID  ? enoxaparin (LOVENOX) injection  220 mg Subcutaneous Q12H  ? [START ON 04/26/2022] fluconazole  800 mg Oral Daily  ? mouth rinse  15 mL Mouth Rinse q12n4p  ? pantoprazole  40 mg Oral Daily  ? Ensure Max Protein  11 oz Oral BID  ? senna-docusate  1 tablet Oral Daily  ? vitamin B-12  1,000 mcg Oral Daily  ? warfarin  2.5 mg Oral ONCE-1600  ?  Warfarin - Pharmacist Dosing Inpatient   Does not apply [id number]  ? ?Continuous Infusions: ? ondansetron [facility]) IV 8 mg (04/24/22 1308)  ? ? ?  ?Diet Order   ? ?       ?  Diet Heart Room service appropriate? Yes; Fluid

## 2022-04-25 NOTE — Progress Notes (Signed)
? ? ?[facility] ? ?Date of Admission:  04/15/2022    ? ?Total days of antibiotics 11 ?        ?ASSESSMENT: ? ?Susan Warren's nausea has resolved with continued abdominal pain that remains unchanged. No fevers or acute events overnight. Discussed plan of care to continue with Augmentin and Fluconazole for 2 weeks from time of aspiration. Will need ophthalmology appointment following discharge. No follow up with ID needed. TOC to bring medications to bedside at time of discharge. Remaining medical and supportive care per primary team.  ? ?PLAN: ? ?Continue fluconazole and Augmentin through 05/06/22. ?Ophthalmology exam following discharge given fungemia.  ?No follow up with ID needed. ?Remaining medical and supportive care per primary team.  ? ?ID will sign off.  ? ?Principal Problem: ?  Abdominal pain due to right abdominal and pelvic cystic mass ?Active Problems: ?  Hypertension ?  GERD (gastroesophageal reflux disease) ?  History of pulmonary embolism ?  Right leg DVT (HCC) ?  Lymphedema of both lower extremities ?  Morbid obesity with body mass index of 70 and over in adult [facility]) ?  Right abdominal and pelvic cystic mass ?  Supratherapeutic INR ?  Hyponatremia ?  Normocytic anemia ?  Leukocytosis ?  Candidemia (HCC) ?  Vitamin B12 deficiency ?  Herpes simplex labialis ?  Hypomagnesemia ? ? ? (feeding supplement) PROSource Plus  30 mL Oral BID BM  ? amLODipine  10 mg Oral q1800  ? amoxicillin-clavulanate  1 tablet Oral Q12H  ? chlorhexidine  15 mL Mouth Rinse BID  ? enoxaparin (LOVENOX) injection  220 mg Subcutaneous Q12H  ? fluconazole  1,200 mg Oral Once  ? Followed by  ? [START ON 04/26/2022] fluconazole  800 mg Oral Daily  ? mouth rinse  15 mL Mouth Rinse q12n4p  ? pantoprazole  40 mg Oral Daily  ? Ensure Max Protein  11 oz Oral BID  ? senna-docusate  1 tablet Oral Daily  ? vitamin B-12  1,000 mcg Oral Daily  ? Warfarin - Pharmacist Dosing Inpatient   Does not apply q1600   ? ? ?SUBJECTIVE: ? ?Afebrile overnight with no acute events.  ? ?Allergies  ?Allergen Reactions  ? Adhesive [Tape] Anaphylaxis and Swelling  ?  *Adhesive Spray*   ? ? ? ?Review of Systems: ?Review of Systems  ?Constitutional:  Negative for chills, fever and weight loss.  ?Respiratory:  Negative for cough, shortness of breath and wheezing.   ?Cardiovascular:  Negative for chest pain and leg swelling.  ?Gastrointestinal:  Positive for abdominal pain. Negative for constipation, diarrhea, nausea and vomiting.  ?Skin:  Negative for rash.  ? ? ? ?OBJECTIVE: ?Vitals:  ? 04/24/22 1139 04/24/22 1233 04/24/22 2041 04/25/22 0601  ?BP: 122/70 122/72 135/73 114/69  ?Pulse: 91 91 90 86  ?Resp: 18 (!) 22 16 16   ?Temp: 97.9 ?F (36.6 ?C) 99 ?F (37.2 ?C) 98.2 ?F (36.8 ?C) 98 ?F (36.7 ?C)  ?TempSrc: Oral Oral Oral Oral  ?SpO2: 97% 100% 94% 92%  ?Weight:      ?Height:      ? ?Body mass index is 82.39 kg/m?. ? ?Physical Exam ?Constitutional:   ?   General: She is not in acute distress. ?   Appearance: She is well-developed. She is obese.  ?Cardiovascular:  ?   Rate and Rhythm: Normal rate and regular rhythm.  ?   Heart sounds: Normal heart sounds.  ?Pulmonary:  ?   Effort: Pulmonary effort is  normal.  ?   Breath sounds: Normal breath sounds.  ?Abdominal:  ?   Tenderness: There is abdominal tenderness in the right upper quadrant.  ?Skin: ?   General: Skin is warm and dry.  ?Neurological:  ?   Mental Status: She is alert and oriented to person, place, and time.  ?Psychiatric:     ?   Behavior: Behavior normal.     ?   Thought Content: Thought content normal.     ?   Judgment: Judgment normal.  ? ? ?Lab Results ?Lab Results  ?Component Value Date  ? WBC 9.3 04/25/2022  ? HGB 10.4 (L) 04/25/2022  ? HCT 33.9 (L) 04/25/2022  ? MCV 89.4 04/25/2022  ? PLT 380 04/25/2022  ?  ?Lab Results  ?Component Value Date  ? CREATININE 0.54 04/24/2022  ? BUN 9 04/24/2022  ? NA 134 (L) 04/24/2022  ? K 4.9 04/24/2022  ? CL 101 04/24/2022  ? CO2 25 04/24/2022   ?  ?Lab Results  ?Component Value Date  ? ALT 9 04/22/2022  ? AST 15 04/22/2022  ? ALKPHOS 90 04/22/2022  ? BILITOT 0.8 04/22/2022  ?  ? ?Microbiology: ?Recent Results (from the past 240 hour(s))  ?Blood culture (routine x 2)     Status: Abnormal (Preliminary result)  ? Collection Time: 04/16/22  3:57 PM  ? Specimen: BLOOD  ?Result Value Ref Range Status  ? Specimen Description   Final  ?  BLOOD BLOOD RIGHT HAND ?Performed at [facility], [address], [place], [place] [address] ?  ? Special Requests   Final  ?  BOTTLES DRAWN AEROBIC ONLY Blood Culture adequate volume ?Performed at [facility], [address] [place], [place] [address] ?  ? Culture  Setup Time (A)  Final  ?  YEAST ?AEROBIC BOTTLE ONLY ?CRITICAL RESULT CALLED TO, READ BACK BY AND VERIFIED WITH: PHARMD M BELL [id number] AT 1326 BY CM ?  ? Culture (A)  Final  ?  CANDIDA PARAPSILOSIS ?Sent to [facility] for further susceptibility testing. ?Performed at [facility], [address], [place], [place] [address] ?  ? Report Status PENDING  Incomplete  ?Blood Culture ID Panel (Reflexed)     Status: Abnormal  ? Collection Time: 04/16/22  3:57 PM  ?Result Value Ref Range Status  ? Enterococcus faecalis NOT DETECTED NOT DETECTED Final  ? Enterococcus Faecium NOT DETECTED NOT DETECTED Final  ? Listeria monocytogenes NOT DETECTED NOT DETECTED Final  ? Staphylococcus species NOT DETECTED NOT DETECTED Final  ? Staphylococcus aureus (BCID) NOT DETECTED NOT DETECTED Final  ? Staphylococcus epidermidis NOT DETECTED NOT DETECTED Final  ? Staphylococcus lugdunensis NOT DETECTED NOT DETECTED Final  ? Streptococcus species NOT DETECTED NOT DETECTED Final  ? Streptococcus agalactiae NOT DETECTED NOT DETECTED Final  ? Streptococcus pneumoniae NOT DETECTED NOT DETECTED Final  ? Streptococcus pyogenes NOT DETECTED NOT DETECTED Final  ? A.calcoaceticus-baumannii NOT DETECTED NOT DETECTED Final  ? Bacteroides fragilis NOT DETECTED NOT  DETECTED Final  ? Enterobacterales NOT DETECTED NOT DETECTED Final  ? Enterobacter cloacae complex NOT DETECTED NOT DETECTED Final  ? Escherichia coli NOT DETECTED NOT DETECTED Final  ? Klebsiella aerogenes NOT DETECTED NOT DETECTED Final  ? Klebsiella oxytoca NOT DETECTED NOT DETECTED Final  ? Klebsiella pneumoniae NOT DETECTED NOT DETECTED Final  ? Proteus species NOT DETECTED NOT DETECTED Final  ? Salmonella species NOT DETECTED NOT DETECTED Final  ? Serratia marcescens NOT DETECTED NOT DETECTED Final  ?  Haemophilus influenzae NOT DETECTED NOT DETECTED Final  ? Neisseria meningitidis NOT DETECTED NOT DETECTED Final  ? Pseudomonas aeruginosa NOT DETECTED NOT DETECTED Final  ? Stenotrophomonas maltophilia NOT DETECTED NOT DETECTED Final  ? Candida albicans NOT DETECTED NOT DETECTED Final  ? Candida auris NOT DETECTED NOT DETECTED Final  ? Candida glabrata NOT DETECTED NOT DETECTED Final  ? Candida krusei NOT DETECTED NOT DETECTED Final  ? Candida parapsilosis DETECTED (A) NOT DETECTED Final  ?  Comment: CRITICAL RESULT CALLED TO, READ BACK BY AND VERIFIED WITH: ?PHARMD M BELL [id number] AT 1326 BY CM ?  ? Candida tropicalis NOT DETECTED NOT DETECTED Final  ? Cryptococcus neoformans/gattii NOT DETECTED NOT DETECTED Final  ?  Comment: Performed at [facility], [address] [place], [place] [address]  ?Blood culture (routine x 2)     Status: None  ? Collection Time: 04/16/22  4:14 PM  ? Specimen: BLOOD  ?Result Value Ref Range Status  ? Specimen Description   Final  ?  BLOOD RIGHT ANTECUBITAL ?Performed at [facility], [address] [place], [place] [address] ?  ? Special Requests   Final  ?  BOTTLES DRAWN AEROBIC ONLY Blood Culture adequate volume ?Performed at [facility], [address] [place], [place] [address] ?  ? Culture   Final  ?  NO GROWTH 5 DAYS ?Performed at [facility], [address] [place], [place] [address] ?  ? Report Status 04/21/2022 FINAL   Final  ?Antifungal AST 9 Drug Panel     Status: None  ? Collection Time: 04/16/22  6:50 PM  ?Result Value Ref Range Status  ? Organism ID, Yeast Candida parapsilosis  Corrected  ?  Comment: (NOTE) ?Identification per

## 2022-04-25 NOTE — Plan of Care (Signed)
  Problem: Activity: Goal: Risk for activity intolerance will decrease Outcome: Progressing   Problem: Nutrition: Goal: Adequate nutrition will be maintained Outcome: Progressing   Problem: Coping: Goal: Level of anxiety will decrease Outcome: Progressing   

## 2022-04-26 ENCOUNTER — Other Ambulatory Visit (HOSPITAL_COMMUNITY): Payer: Self-pay

## 2022-04-26 LAB — PROTIME-INR
INR: 1.2 (ref 0.8–1.2)
Prothrombin Time: 15.1 seconds (ref 11.4–15.2)

## 2022-04-26 LAB — CBC
HCT: 34.8 % — ABNORMAL LOW (ref 36.0–46.0)
Hemoglobin: 10.6 g/dL — ABNORMAL LOW (ref 12.0–15.0)
MCH: 27 pg (ref 26.0–34.0)
MCHC: 30.5 g/dL (ref 30.0–36.0)
MCV: 88.8 fL (ref 80.0–100.0)
Platelets: 369 10*3/uL (ref 150–400)
RBC: 3.92 MIL/uL (ref 3.87–5.11)
RDW: 17 % — ABNORMAL HIGH (ref 11.5–15.5)
WBC: 9.2 10*3/uL (ref 4.0–10.5)
nRBC: 0 % (ref 0.0–0.2)

## 2022-04-26 MED ORDER — MELATONIN 3 MG PO TABS
3.0000 mg | ORAL_TABLET | Freq: Once | ORAL | Status: AC
  Administered 2022-04-26: 3 mg via ORAL
  Filled 2022-04-26: qty 1

## 2022-04-26 MED ORDER — HYDROCODONE-ACETAMINOPHEN 5-325 MG PO TABS
1.0000 | ORAL_TABLET | Freq: Four times a day (QID) | ORAL | Status: DC | PRN
  Administered 2022-04-26 – 2022-04-29 (×7): 1 via ORAL
  Filled 2022-04-26 (×8): qty 1

## 2022-04-26 MED ORDER — WARFARIN SODIUM 3 MG PO TABS
3.0000 mg | ORAL_TABLET | Freq: Once | ORAL | Status: AC
  Administered 2022-04-26: 3 mg via ORAL
  Filled 2022-04-26: qty 1

## 2022-04-26 NOTE — Progress Notes (Signed)
?PROGRESS NOTE ?Susan Warren  [id number] DOB: March 19, 1976 DOA: 04/15/2022 ?PCP: Elsie Stain, MD  ? ?Brief Narrative/Hospital Course: ?46 year old F with PMH of DVT/PE on warfarin, morbid obesity, lymphedema, anxiety and depression presenting with nausea, vomiting and abdominal pain, and admitted for abdominal pain.  CT abdomen and pelvis showed interval progression of the large cystic mass in the right abdomen and pelvis measuring about 24 x 24 x 24 cm and hepatic cirrhosis but no acute finding.  She had leukocytosis to 17.6.  INR was supratherapeutic.  She was started on IV Zosyn.  IR consulted and planning drainage once INR allows. ? ?Blood culture with Candida parapsilosis in 1 out of 2 bottles.  ID consulted and started micafungin in addition to IV Zosyn.  TTE without significant finding of vegetation.  ID recommending TEE.  Repeat blood culture on 4/28 NGTD. ? ?INR reversed with FFP and vitamin K.  She had US guided aspiration of large abdominal cystic mass that yielded 7.2 L brown debris-filled cystic fluid on 5/1.  Fluid study pending.  TEE on 5/2 without vegetation. ?Restarted warfarin with Lovenox bridge.  ID following-patient was transitioned to oral antibiotic and Diflucan 5/3 ?  ?Subjective: ? ?Seen and examined, she has no new pain, reports some abdominal discomfort on the right side otherwise doing fairly okay.  Reports she is taking Dilaudid as oxycodone does not work on her and she thinks hydrocodone will work better- so added. ? ?Assessment and Plan: ?Principal Problem: ?  Abdominal pain due to right abdominal and pelvic cystic mass ?Active Problems: ?  Candidemia ([place]) ?  Supratherapeutic INR ?  History of pulmonary embolism ?  Right leg DVT ([place]) ?  Morbid obesity with body mass index of 70 and over in adult [facility]) ?  Normocytic anemia ?  Leukocytosis ?  Hypertension ?  GERD (gastroesophageal reflux disease) ?  Lymphedema of both lower extremities ?  Right abdominal and pelvic cystic  mass ?  Hyponatremia ?  Vitamin B12 deficiency ?  Herpes simplex labialis ?  Hypomagnesemia ?  ?Abdominal pain due to right abdominal and pelvic cystic mass/abscess: ?Cystic mass has been progressively worsening since 2018- was noted on previous CTs measuring 18.5 x 16.3 cm (05/08/2017) > 19 x 17 cm ( 04/12/2019)> 24.2 x 24.3 x 24.6 cm ( 04/16/22).  Unclear etiology.  Ultrasound-guided aspiration by IR yielded 7.2 L of brown debris-filled cystic fluid-some delay in fluid analysis, Gram stain negative culture no growth so far cytology/pathology- no malignant cells but numerous acute inflammatory cells and necrotic debris compatible with abscess ?BCID and 1 out of 2 blood culture with Candidia Parapsilosis.  Repeat blood culture 4/28 NGTD. ?CA125 slightly elevated to 65.5 but difficult to interpret in the setting of possible acute infection per gyn/onc.   ?Patient is significantly improved.  Afebrile WC count normal.  Continue current Augmentin and Diflucan to complete 14 days course as per ID, monitor closely if has persistent pain or worsening pain could repeat CT scan.Surgery recommending outpatient follow-up. ? ?Candidemia:BCID and blood culture with Candida parpasilosis in 1 out of 2 bottles.  She has no visual change.  Repeat blood cultures NGTD.  TTE and TEE without vegetation. micafungin converted to Diflucan to complete 14 days course.   ? ?Supratherapeutic INR:INR 5.9>> 1.3 after FFP x2 and IV vitamin K 5 mg.  Continue Lovenox bridge waiting for INR to be therapeutic continue Coumadin per pharmacy.  ?Recent Labs  ?Lab 04/22/22 ?0025 04/23/22 ?[id number] 04/24/22 ?[id number] 04/25/22 ?[id number]  04/26/22 ?0335  ?INR 1.4* 1.3* 1.2 1.2 1.2  ? ?  ?Leukocytosis:Resolved ? ?Normocytic anemia:Due to chronic disease and B12 deficiency, continue B12 supplement monitor H&H ?Recent Labs  ?Lab 04/22/22 ?0025 04/23/22 ?[id number] 04/24/22 ?[id number] 04/25/22 ?[id number] 04/26/22 ?0335  ?HGB 10.1* 10.3* 10.6* 10.4* 10.6*  ?HCT 33.9* 34.2* 34.7* 33.9* 34.8*   ? ?  ?Right leg DVT ?History of pulmonary embolism: ?Continue Lovenox bridge/Coumadin, pharmacy dosing.  ? ?Hypomagnesemia:Repleted ? ?Herpes simplex labialis:Blisters right upper lip.  Reports history of cold sore.Received p.o. Valtrex 2 g x 2 ? ?Hyponatremia improved ?GERD on PPI ?Hypertension BP-stable on home meds ?Morbid obesity:Patient's Body mass index is 82.39 kg/m?. : Will benefit with PCP follow-up, weight loss  healthy lifestyle and outpatient sleep evaluation. ? ?DVT prophylaxis: lovenox/warfarin ?Code Status:   Code Status: Full Code ?Family Communication: plan of care discussed with patient at bedside. ?Patient status is: Inpatient because of pending therapeutic INR.   ?Level of care: Med-Surg  ? ?Dispo: The patient is from: home ?           Anticipated disposition: home once INR therapeutic ? ?Mobility Assessment (last 72 hours)   ? ? Mobility Assessment   ? ? [place] Name 04/26/22 [phone] 04/25/22 1925 04/25/22 1025 04/24/22 2000 04/24/22 0803  ? Does patient have an order for bedrest or is patient medically unstable No - Continue assessment No - Continue assessment No - Continue assessment No - Continue assessment No - Continue assessment  ? What is the highest level of mobility based on the progressive mobility assessment? Level 5 (Walks with assist in room/hall) - Balance while stepping forward/back and can walk in room with assist - Complete Level 5 (Walks with assist in room/hall) - Balance while stepping forward/back and can walk in room with assist - Complete Level 5 (Walks with assist in room/hall) - Balance while stepping forward/back and can walk in room with assist - Complete Level 5 (Walks with assist in room/hall) - Balance while stepping forward/back and can walk in room with assist - Complete Level 5 (Walks with assist in room/hall) - Balance while stepping forward/back and can walk in room with assist - Complete  ? ? [place] Name 04/23/22 1943  ?  ?  ?  ?  ? Does patient have an order for  bedrest or is patient medically unstable No - Continue assessment      ? What is the highest level of mobility based on the progressive mobility assessment? Level 5 (Walks with assist in room/hall) - Balance while stepping forward/back and can walk in room with assist - Complete      ? ?  ?  ? ?  ?  ? ?Objective: ?Vitals last 24 hrs: ?Vitals:  ? 04/25/22 1951 04/25/22 2121 04/26/22 0156 04/26/22 0530  ?BP: 131/69 104/69 132/64 132/70  ?Pulse: 86 88 88 90  ?Resp: 16 17 17 17   ?Temp: 98.3 ?F (36.8 ?C) 98.1 ?F (36.7 ?C) 98.4 ?F (36.9 ?C) 98.2 ?F (36.8 ?C)  ?TempSrc: Oral Oral Oral Oral  ?SpO2: 97% 94% 93% 94%  ?Weight:      ?Height:      ? ?Weight change:  ? ?Physical Examination: ?General exam: AAox3,older than stated age, weak appearing. ?HEENT:Oral mucosa moist, Ear/Nose WNL grossly, dentition normal. ?Respiratory system: b/l clear,no use of accessory muscle ?Cardiovascular system: S1 & S2 +, No JVD,. ?Gastrointestinal system: Abdomen soft, obese, midly tender rt abdomen-but difficult to palpate due to abdominal obesity BS+ ?Nervous System:Alert, awake, moving  extremities and grossly nonfocal ?Extremities: edema neg,distal peripheral pulses palpable.  ?Skin: No rashes,no icterus. ?MSK: Normal muscle bulk,tone, power ? ? ?Medications reviewed:  ?Scheduled Meds: ? (feeding supplement) PROSource Plus  30 mL Oral BID BM  ? amLODipine  10 mg Oral q1800  ? amoxicillin-clavulanate  1 tablet Oral Q12H  ? chlorhexidine  15 mL Mouth Rinse BID  ? enoxaparin (LOVENOX) injection  220 mg Subcutaneous Q12H  ? fluconazole  800 mg Oral Daily  ? mouth rinse  15 mL Mouth Rinse q12n4p  ? pantoprazole  40 mg Oral Daily  ? Ensure Max Protein  11 oz Oral BID  ? senna-docusate  1 tablet Oral Daily  ? vitamin B-12  1,000 mcg Oral Daily  ? Warfarin - Pharmacist Dosing Inpatient   Does not apply [id number]  ? ?Continuous Infusions: ? ondansetron [facility]) IV 8 mg (04/24/22 1308)  ? ? ?  ?Diet Order   ? ?       ?  Diet Heart Room service  appropriate? Yes; Fluid consistency: Thin  Diet effective now       ?  ? ?  ?  ? ?  ?  ? ?Nutrition Problem: Inadequate oral intake ?Etiology: acute illness, nausea, decreased appetite ?Signs/Symptoms: per patient/family re

## 2022-04-26 NOTE — Progress Notes (Signed)
ANTICOAGULATION CONSULT NOTE - Follow Up Consult ? ?Pharmacy Consult for enoxaparin, warfarin  ?Indication: h/o PE ? ?Allergies  ?Allergen Reactions  ? Adhesive [Tape] Anaphylaxis and Swelling  ?  *Adhesive Spray*   ? ? ?Patient Measurements: ?Height: 5\' 4"  (162.6 cm) ?Weight: (!) 217.7 kg (480 lb) ?IBW/kg (Calculated) : 54.7 ?Heparin Dosing Weight: 113 kg ? ?Vital Signs: ?Temp: 98.4 ?F (36.9 ?C) (05/05 1339) ?Temp Source: Oral (05/05 1339) ?BP: 125/72 (05/05 1339) ?Pulse Rate: 86 (05/05 1339) ? ?Labs: ?Recent Labs  ?  04/24/22 ?0434 04/24/22 ?0436 04/24/22 ?1011 04/25/22 ?0454 04/26/22 ?0335  ?HGB 10.6*  --   --  10.4* 10.6*  ?HCT 34.7*  --   --  33.9* 34.8*  ?PLT 334  --   --  380 369  ?LABPROT 14.9  --   --  15.5* 15.1  ?INR 1.2  --   --  1.2 1.2  ?HEPRLOWMOCWT  --   --  0.46  --   --   ?CREATININE  --  0.54  --   --   --   ? ? ? ?Estimated Creatinine Clearance: 162.8 mL/min (by C-G formula based on SCr of 0.54 mg/dL). ? ?Assessment: ?AC/Heme: PTA warfarin for hx PE on hold for high INR (5.7 on admit) and possible cyst aspiration (INR must be <2.5)  ?- IV Vit K 5mg  given 4/28 ?- Stop heparin ?- Pt was transitioned to enoxaparin 220 mg SQ q12h  ?- Notified by RN that IV pumps have a hard stop of upper limit for heparin gtt @ 39.9999 ml/hr, so increasing rate to 45 ml/hr is not possible ? ? ?Pt warfarin regimen is warfarin 5 mg daily except 2.5 mg on Monday and Friday. ? ? ?Today, 04/26/22  ?INR 1.2, subtherapeutic  ?LWMH is 0.46. Target range is 0.6 to 1. Times of administration were slightly off as pt was transported back and forth for TEE  ?Hgb 10.6 low but stable ?Plt 369 ?TEE completed 5/2  ?DDI: new start fluconazole 1200 mg PO x1 then 800 mg PO daily , changed from micafungin ? ? ?Goal of Therapy:  ?Heparin level 0.3-0.7 units/ml ?Monitor platelets by anticoagulation protocol: Yes ?  ?Plan:  ?Continue enoxaparin 220 mg SQ q12h  ?Warfarin 3 mg PO x 1 due to high doses of fluconazole.  Slightly higher dose  today as INR has not trended up  ?Will monitor INR changes closely since fluconazole started. ?Monitor daily INR, CBC ? ? ? ?06/26/22, PharmD, BCPS ?04/26/2022 2:39 PM ? ? ? ?

## 2022-04-26 NOTE — Progress Notes (Incomplete)
Admit date:04/15/2022 ?Chief compliant: Abdominal pain ?Hospital Course: 46 year old F with PMH of DVT/PE on warfarin, morbid obesity, lymphedema, anxiety and depression presented with nausea, vomiting and abdominal pain.  CT abdomen and pelvis showed interval progression of the large cystic mass in the right abd/ pelvis measuring 24 x 24 x 24 cm and hepatic cirrhosis but no acute finding. She had WBC 17.6K.  INR was supratherapeutic.  She was started on IV Zosyn.  IR consulted and planned drainage once INR allowed ?  ?Blood culture with Candida parapsilosis in 1 out of 2 bottles.  ID consulted and started micafungin in addition to IV Zosyn.  TTE /TEE without significant finding or vegetation. Repeat blood culture on 4/28 NGTD.ID following-patient was transitioned to oral Augmentin and Diflucan 5/3--total 14 days ?  ?INR reversed with FFP and vitamin K.  She had US guided aspiration of large abdominal cystic mass on 5/1 that yielded 7.2 L brown debris-filled cystic fluid.  Fluid study consistent with abscess, no malignancy.  CA125 slightly elevated to 65.5 but difficult to interpret in the setting of possible acute infection per gyn/onc.   ?Patient is significantly improved.  Afebrile WC count normal. Restarted warfarin with Lovenox bridge--now awaiting INR to be therapeutic before d/c home.   ? ? ?Assessment/Plan: ? ? ? ?DVT prophylaxis:  ?Code Status:  ?Family / Patient Communication:  ?Disposition Plan:   ?Status is: Inpatient ?{Inpatient:23812} ?  ?

## 2022-04-26 NOTE — Plan of Care (Signed)
  Problem: Education: Goal: Knowledge of General Education information will improve Description Including pain rating scale, medication(s)/side effects and non-pharmacologic comfort measures Outcome: Progressing   Problem: Health Behavior/Discharge Planning: Goal: Ability to manage health-related needs will improve Outcome: Progressing   

## 2022-04-27 ENCOUNTER — Other Ambulatory Visit (HOSPITAL_COMMUNITY): Payer: Self-pay

## 2022-04-27 LAB — PROTIME-INR
INR: 1.3 — ABNORMAL HIGH (ref 0.8–1.2)
Prothrombin Time: 15.7 seconds — ABNORMAL HIGH (ref 11.4–15.2)

## 2022-04-27 LAB — CBC
HCT: 35 % — ABNORMAL LOW (ref 36.0–46.0)
Hemoglobin: 10.6 g/dL — ABNORMAL LOW (ref 12.0–15.0)
MCH: 26.7 pg (ref 26.0–34.0)
MCHC: 30.3 g/dL (ref 30.0–36.0)
MCV: 88.2 fL (ref 80.0–100.0)
Platelets: 384 10*3/uL (ref 150–400)
RBC: 3.97 MIL/uL (ref 3.87–5.11)
RDW: 17 % — ABNORMAL HIGH (ref 11.5–15.5)
WBC: 9.4 10*3/uL (ref 4.0–10.5)
nRBC: 0 % (ref 0.0–0.2)

## 2022-04-27 LAB — BODY FLUID CULTURE W GRAM STAIN: Culture: NO GROWTH

## 2022-04-27 MED ORDER — FLUCONAZOLE 200 MG PO TABS
800.0000 mg | ORAL_TABLET | Freq: Every day | ORAL | 0 refills | Status: DC
  Filled 2022-04-27 – 2022-04-29 (×2): qty 32, 8d supply, fill #0

## 2022-04-27 MED ORDER — WARFARIN SODIUM 2.5 MG PO TABS
2.5000 mg | ORAL_TABLET | Freq: Once | ORAL | Status: AC
  Administered 2022-04-27: 2.5 mg via ORAL
  Filled 2022-04-27: qty 1

## 2022-04-27 MED ORDER — AMOXICILLIN-POT CLAVULANATE 875-125 MG PO TABS
1.0000 | ORAL_TABLET | Freq: Two times a day (BID) | ORAL | 0 refills | Status: DC
  Filled 2022-04-27 – 2022-04-29 (×2): qty 10, 5d supply, fill #0

## 2022-04-27 MED ORDER — MELATONIN 5 MG PO TABS
5.0000 mg | ORAL_TABLET | Freq: Every evening | ORAL | Status: DC | PRN
  Administered 2022-04-27 – 2022-04-28 (×2): 5 mg via ORAL
  Filled 2022-04-27 (×2): qty 1

## 2022-04-27 NOTE — Progress Notes (Signed)
ANTICOAGULATION CONSULT NOTE - Follow Up Consult ? ?Pharmacy Consult for enoxaparin, warfarin  ?Indication: h/o PE ? ?Allergies  ?Allergen Reactions  ? Adhesive [Tape] Anaphylaxis and Swelling  ?  *Adhesive Spray*   ? ? ?Patient Measurements: ?Height: 5\' 4"  (162.6 cm) ?Weight: (!) 217.7 kg (480 lb) ?IBW/kg (Calculated) : 54.7 ?Heparin Dosing Weight: 113 kg ? ?Vital Signs: ?Temp: 97.9 ?F (36.6 ?C) (05/06 0436) ?Temp Source: Oral (05/06 0436) ?BP: 128/77 (05/06 0436) ?Pulse Rate: 84 (05/06 0436) ? ?Labs: ?Recent Labs  ?  04/24/22 ?1011 04/25/22 ?0454 04/25/22 ?0454 04/26/22 ?06/26/22 04/27/22 ?06/27/22  ?HGB  --  10.4*   < > 10.6* 10.6*  ?HCT  --  33.9*  --  34.8* 35.0*  ?PLT  --  380  --  369 384  ?LABPROT  --  15.5*  --  15.1 15.7*  ?INR  --  1.2  --  1.2 1.3*  ?HEPRLOWMOCWT 0.46  --   --   --   --   ? < > = values in this interval not displayed.  ? ? ? ?Estimated Creatinine Clearance: 162.8 mL/min (by C-G formula based on SCr of 0.54 mg/dL). ? ?Assessment: ?AC/Heme: PTA warfarin for hx PE on hold for high INR (5.7 on admit) and possible cyst aspiration (INR must be <2.5)  ?- IV Vit K 5mg  given 4/28 ?- Notified by RN that IV pumps have a hard stop of upper limit for heparin gtt @ 39.9999 ml/hr, so increasing rate to 45 ml/hr is not possible - stop heparin and transition to enoxaparin ?- Pt was transitioned to enoxaparin 220 mg SQ q12h  ? ?Pt warfarin regimen is warfarin 5 mg daily except 2.5 mg on Monday and Friday. ? ? ?Today, 04/27/22  ?INR 1.3, subtherapeutic  ?LWMH is 0.46. Target range is 0.6 to 1. Times of administration were slightly off as pt was transported back and forth for TEE  ?Hgb, plt stable  ?DDI: new start fluconazole 1200 mg PO x1 then 800 mg PO daily , changed from micafungin (plan through 5/14) ? ? ?Goal of Therapy:  ?Heparin level 0.3-0.7 units/ml ?Monitor platelets by anticoagulation protocol: Yes ?  ?Plan:  ?Continue enoxaparin 220 mg SQ q12h  ?Expect INR to start trending up in next 24 hrs ?Continue  with warfarin 2.5 mg daily (represents ~ 40% reduction in dose given DDI with fluconazole AND supratherapeutic INR on admission) ?Will monitor INR changes closely since fluconazole started ?Monitor daily INR, CBC ? ? ?06/27/22, PharmD, BCPS ?Clinical Pharmacist ?04/27/2022 9:25 AM ? ? ? ? ?

## 2022-04-27 NOTE — Plan of Care (Signed)
  Problem: Coping: Goal: Level of anxiety will decrease Outcome: Progressing   Problem: Pain Managment: Goal: General experience of comfort will improve Outcome: Progressing   

## 2022-04-27 NOTE — Plan of Care (Signed)

## 2022-04-27 NOTE — Progress Notes (Addendum)
?PROGRESS NOTE ? ? ? ?Susan Warren  ?ZSM:[id number]  ?DOB: Aug 28, 1976  ?PCP: Storm Frisk, MD ? ?Admit date:04/15/2022 ?Chief compliant: Abdominal pain ?Hospital Course: 46 year old F with PMH of DVT/PE on warfarin, morbid obesity, lymphedema, anxiety and depression presented with nausea, vomiting and abdominal pain.  CT abdomen and pelvis showed interval progression of the large cystic mass in the right abd/ pelvis measuring 24 x 24 x 24 cm and hepatic cirrhosis but no acute finding. She had WBC 17.6K.  INR was supratherapeutic.  She was started on IV Zosyn.  IR consulted and planned drainage once INR allowed ?Blood culture with Candida parapsilosis in 1 out of 2 bottles.  ID consulted and started micafungin in addition to IV Zosyn.  TTE /TEE without significant finding or vegetation. Repeat blood culture on 4/28 NGTD.ID following-patient was transitioned to oral Augmentin and Diflucan 5/3--total 14 days ?  ?INR reversed with FFP and vitamin K.  She had US guided aspiration of large abdominal cystic mass on 5/1 that yielded 7.2 L brown debris-filled cystic fluid.  Fluid study consistent with abscess, no malignancy.  CA125 slightly elevated to 65.5 but difficult to interpret in the setting of possible acute infection per gyn/onc.   ?Patient is now significantly improved.  Afebrile WC count normal. Restarted warfarin with Lovenox bridge--has been now awaiting INR to be therapeutic before d/c home.  Last CTA on 04/08/22 poor technique but no obvious PE reported. Lower extremity venous bilateral study -ve for DVT on 04/09/22.  ?  ? ?Subjective: ? ?Patient resting comfortably.  Was able to shower independently and return to her bed.  Denies any dyspnea.  States abdominal/pelvic pain finally beginning to improve.  When asked about self administration of Lovenox, she states she tried in the past and could not do it.  She is afraid of needles.  She is however willing to learn and try again.  INR remains low at 1.3  today.  No other complaints per se. ? ?Objective: ?Vitals:  ? 04/26/22 0530 04/26/22 1339 04/26/22 2034 04/27/22 0436  ?BP: 132/70 125/72 136/73 128/77  ?Pulse: 90 86 91 84  ?Resp: 17 18 18 16   ?Temp: 98.2 ?F (36.8 ?C) 98.4 ?F (36.9 ?C) 98.5 ?F (36.9 ?C) 97.9 ?F (36.6 ?C)  ?TempSrc: Oral Oral Oral Oral  ?SpO2: 94% 98% 96% 95%  ?Weight:      ?Height:      ? ? ?Intake/Output Summary (Last 24 hours) at 04/27/2022 0844 ?Last data filed at 04/27/2022 0801 ?Gross per 24 hour  ?Intake 1528 ml  ?Output 6050 ml  ?Net -4522 ml  ? ?Filed Weights  ? 04/15/22 1924  ?Weight: (!) 217.7 kg  ? ? ?Physical Examination: ? ?General: Morbidly obese female in no acute distress noted ?Head ENT: Atraumatic normocephalic, PERRLA, neck supple ?Heart: S1-S2 heard, regular rate and rhythm, no murmurs.  No leg edema noted ?Lungs: Equal air entry bilaterally, no rhonchi or rales on exam, no accessory muscle use ?Abdomen: Obese, BS heard, soft, mildly tender on the right side, nondistended.  No CVA tenderness ?Extremities: Chronic lymphedema of bilateral lower extremities,  No cyanosis or clubbing. ?Neurological: Awake alert oriented x3, no focal weakness or numbness, strength and sensations to crude touch intact ?Skin: No wounds or rashes.  ? ?Data Reviewed: I have personally reviewed following labs and imaging studies ? ?CBC: ?Recent Labs  ?Lab 04/23/22 ?0452 04/24/22 ?0434 04/25/22 ?0454 04/26/22 ?06/26/22 04/27/22 ?06/27/22  ?WBC 14.1* 10.2 9.3 9.2 9.4  ?HGB 10.3* 10.6*  10.4* 10.6* 10.6*  ?HCT 34.2* 34.7* 33.9* 34.8* 35.0*  ?MCV 88.1 86.3 89.4 88.8 88.2  ?PLT 432* 334 380 369 384  ? ?Basic Metabolic Panel: ?Recent Labs  ?Lab 04/21/22 ?0718 04/22/22 ?0025 04/23/22 ?[id number] 04/24/22 ?[id number]  ?NA 135 134* 136 134*  ?K 3.9 4.3 4.2 4.9  ?CL 97* 98 101 101  ?CO2 29 30 28 25   ?GLUCOSE 133* 155* 125* 115*  ?BUN 8 8 7 9   ?CREATININE 0.64 0.62 0.47 0.54  ?CALCIUM 8.2* 8.2* 8.0* 8.1*  ?MG 1.6* 1.8 1.7 1.7  ?PHOS 3.4 3.3 4.3 4.5  ? ?GFR: ?Estimated Creatinine Clearance:  162.8 mL/min (by C-G formula based on SCr of 0.54 mg/dL). ?Liver Function Tests: ?Recent Labs  ?Lab 04/21/22 ?0718 04/22/22 ?0025 04/23/22 ?[id number] 04/24/22 ?[id number]  ?AST  --  15  --   --   ?ALT  --  9  --   --   ?ALKPHOS  --  90  --   --   ?BILITOT  --  0.8  --   --   ?PROT  --  7.4  --   --   ?ALBUMIN 2.0* 1.9* 1.7* 1.8*  ? ?No results for input(s): LIPASE, AMYLASE in the last 168 hours. ?No results for input(s): AMMONIA in the last 168 hours. ?Coagulation Profile: ?Recent Labs  ?Lab 04/23/22 ?0452 04/24/22 ?0434 04/25/22 ?0454 04/26/22 ?[id number] 04/27/22 ?[id number]  ?INR 1.3* 1.2 1.2 1.2 1.3*  ? ?Cardiac Enzymes: ?No results for input(s): CKTOTAL, CKMB, CKMBINDEX, TROPONINI in the last 168 hours. ?BNP (last 3 results) ?No results for input(s): PROBNP in the last 8760 hours. ?HbA1C: ?No results for input(s): HGBA1C in the last 72 hours. ?CBG: ?No results for input(s): GLUCAP in the last 168 hours. ?Lipid Profile: ?No results for input(s): CHOL, HDL, LDLCALC, TRIG, CHOLHDL, LDLDIRECT in the last 72 hours. ?Thyroid Function Tests: ?No results for input(s): TSH, T4TOTAL, FREET4, T3FREE, THYROIDAB in the last 72 hours. ?Anemia Panel: ?No results for input(s): VITAMINB12, FOLATE, FERRITIN, TIBC, IRON, RETICCTPCT in the last 72 hours. ?Sepsis Labs: ?No results for input(s): PROCALCITON, LATICACIDVEN in the last 168 hours. ? ?Recent Results (from the past 240 hour(s))  ?Culture, blood (Routine X 2) w Reflex to ID Panel     Status: None  ? Collection Time: 04/19/22  2:22 PM  ? Specimen: BLOOD  ?Result Value Ref Range Status  ? Specimen Description   Final  ?  BLOOD BLOOD RIGHT HAND ?Performed at [facility], [address] [place], [place] [address] ?  ? Special Requests   Final  ?  IN PEDIATRIC BOTTLE Blood Culture adequate volume ?Performed at [facility], [address] [place], [place] [address] ?  ? Culture   Final  ?  NO GROWTH 5 DAYS ?Performed at [facility], [address] [place], [place] [address] ?  ? Report Status 04/24/2022 FINAL  Final  ?Culture, blood (Routine X 2) w Reflex to ID Panel     Status: None  ? Collection Time: 04/19/22  2:35 PM  ? Specimen: BLOOD  ?Result Value Ref Range Status  ? Specimen Description   Final  ?  BLOOD BLOOD LEFT HAND ?Performed at [facility], [address] [place], [place] [address] ?  ? Special Requests   Final  ?  BOTTLES DRAWN AEROBIC ONLY Blood Culture adequate volume ?Performed at [facility], [address] [place], [place] [address] ?  ? Culture  Final  ?  NO GROWTH 5 DAYS ?Performed at [facility], [address] [place], [place] [address] ?  ? Report Status 04/24/2022 FINAL  Final  ?Body fluid culture w Gram Stain     Status: None (Preliminary result)  ? Collection Time: 04/22/22  2:22 PM  ? Specimen: Abdomen; Body Fluid  ?Result Value Ref Range Status  ? Specimen Description   Final  ?  ABDOMEN ?Performed at [facility], [address], [place], [place] [address] ?  ? Special Requests   Final  ?  NONE RIGHT ABDOMINAL AND PELVIC MASS DRAINAGE ?Performed at [facility], [address], [place], [place] [address] ?  ? Gram Stain   Final  ?  RARE WBC PRESENT,BOTH PMN AND MONONUCLEAR ?NO ORGANISMS SEEN ?  ? Culture   Final  ?  NO GROWTH 3 DAYS ?Performed at [facility], [address] [place], [place] [address] ?  ? Report Status PENDING  Incomplete  ?  ? ? ?Radiology Studies: ?No results found. ? ? ? ?Scheduled Meds: ? (feeding supplement) PROSource Plus  30 mL Oral BID BM  ? amLODipine  10 mg Oral q1800  ? amoxicillin-clavulanate  1 tablet Oral Q12H  ? chlorhexidine  15 mL Mouth Rinse BID  ? enoxaparin (LOVENOX) injection  220 mg Subcutaneous Q12H  ? fluconazole  800 mg Oral Daily  ? mouth rinse  15 mL Mouth Rinse q12n4p  ? pantoprazole  40 mg Oral Daily  ? Ensure Max Protein  11 oz Oral BID  ? senna-docusate  1 tablet Oral Daily  ? vitamin  B-12  1,000 mcg Oral Daily  ? Warfarin - Pharmacist Dosing Inpatient   Does not apply q1600  ? ?Continuous Infusions: ? ondansetron [facility]) IV 8 mg (04/24/22 1308)  ? ? ?  ?Assessment/Plan: ?  ?Right-sided abd

## 2022-04-28 LAB — CBC
HCT: 35.7 % — ABNORMAL LOW (ref 36.0–46.0)
Hemoglobin: 10.9 g/dL — ABNORMAL LOW (ref 12.0–15.0)
MCH: 27 pg (ref 26.0–34.0)
MCHC: 30.5 g/dL (ref 30.0–36.0)
MCV: 88.4 fL (ref 80.0–100.0)
Platelets: 379 10*3/uL (ref 150–400)
RBC: 4.04 MIL/uL (ref 3.87–5.11)
RDW: 17.3 % — ABNORMAL HIGH (ref 11.5–15.5)
WBC: 9.7 10*3/uL (ref 4.0–10.5)
nRBC: 0 % (ref 0.0–0.2)

## 2022-04-28 LAB — PROTIME-INR
INR: 1.3 — ABNORMAL HIGH (ref 0.8–1.2)
Prothrombin Time: 16.2 seconds — ABNORMAL HIGH (ref 11.4–15.2)

## 2022-04-28 MED ORDER — WARFARIN SODIUM 5 MG PO TABS
5.0000 mg | ORAL_TABLET | Freq: Once | ORAL | Status: AC
  Administered 2022-04-28: 5 mg via ORAL
  Filled 2022-04-28: qty 1

## 2022-04-28 NOTE — Progress Notes (Signed)
Patient sleeping in bed with air pods in, no acute distress noted, will continue to monitor. ? ?

## 2022-04-28 NOTE — Progress Notes (Signed)
?PROGRESS NOTE ? ? ? ?Susan Warren  ?[id number]  ?DOB: 09-Sep-1976  ?PCP: Elsie Stain, MD ? ?Admit date:04/15/2022 ?Chief compliant: Abdominal pain ?Hospital Course: 46 year old F with PMH of DVT/PE on warfarin, morbid obesity, lymphedema, anxiety and depression presented with nausea, vomiting and abdominal pain.  CT abdomen and pelvis showed interval progression of the large cystic mass in the right abd/ pelvis measuring 24 x 24 x 24 cm and hepatic cirrhosis but no acute finding. She had WBC 17.6K.  INR was supratherapeutic.  She was started on IV Zosyn.  IR consulted and planned drainage once INR allowed ?Blood culture with Candida parapsilosis in 1 out of 2 bottles.  ID consulted and started micafungin in addition to IV Zosyn.  TTE /TEE without significant finding or vegetation. Repeat blood culture on 4/28 NGTD.ID following-patient was transitioned to oral Augmentin and Diflucan 5/3--total 14 days ? ?INR reversed with FFP and vitamin K.  She had US guided aspiration of large abdominal cystic mass on 5/1 that yielded 7.2 L brown debris-filled cystic fluid.  Fluid study consistent with abscess, no malignancy.  CA125 slightly elevated to 65.5 but difficult to interpret in the setting of possible acute infection per gyn/onc.   ?Patient is now significantly improved.  Afebrile WC count normal. Restarted warfarin with Lovenox bridge--has been now awaiting INR to be therapeutic before d/c home.  Last CTA on 04/08/22 poor technique but no obvious PE reported. Lower extremity venous bilateral study -ve for DVT on 04/09/22.  ?  ? ?Subjective: ?Patient denies any acute complaints.  She is now somewhat comfortable with self administration of Lovenox although might require multiple syringes in order to accommodate weight-based Lovenox dosage (220 mg) she states abdominal/pelvic pain finally beginning to improve.  INR still remains low at 1.3 today.   ?Objective: ?Vitals:  ? 04/27/22 1345 04/27/22 2102 04/28/22 0601  04/28/22 1310  ?BP: (!) 152/67 (!) 143/66 138/75 131/79  ?Pulse: 96 100 88 88  ?Resp: 18 16 14 18   ?Temp: 97.9 ?F (36.6 ?C) 99 ?F (37.2 ?C) 98 ?F (36.7 ?C) 98.4 ?F (36.9 ?C)  ?TempSrc: Oral Oral Oral Oral  ?SpO2: 98% 94% 96% 96%  ?Weight:      ?Height:      ? ? ?Intake/Output Summary (Last 24 hours) at 04/28/2022 1425 ?Last data filed at 04/28/2022 1000 ?Gross per 24 hour  ?Intake 360 ml  ?Output 2050 ml  ?Net -1690 ml  ? ? ?Filed Weights  ? 04/15/22 1924  ?Weight: (!) 217.7 kg  ? ? ?Physical Examination: ? ?General: Morbidly obese female in no acute distress noted ?Head ENT: Atraumatic normocephalic, PERRLA, neck supple ?Heart: S1-S2 heard, regular rate and rhythm, no murmurs.  No leg edema noted ?Lungs: Equal air entry bilaterally, no rhonchi or rales on exam, no accessory muscle use ?Abdomen: Obese, BS heard, soft, mildly tender on the right side, nondistended.  No CVA tenderness ?Extremities: Chronic lymphedema of bilateral lower extremities,  No cyanosis or clubbing. ?Neurological: Awake alert oriented x3, no focal weakness or numbness, strength and sensations to crude touch intact ?Skin: No wounds or rashes.  ? ?Data Reviewed: I have personally reviewed following labs and imaging studies ? ?CBC: ?Recent Labs  ?Lab 04/24/22 ?0434 04/25/22 ?0454 04/26/22 ?[id number] 04/27/22 ?[id number] 04/28/22 ?[id number]  ?WBC 10.2 9.3 9.2 9.4 9.7  ?HGB 10.6* 10.4* 10.6* 10.6* 10.9*  ?HCT 34.7* 33.9* 34.8* 35.0* 35.7*  ?MCV 86.3 89.4 88.8 88.2 88.4  ?PLT 334 380 369 384 379  ? ? ?  Basic Metabolic Panel: ?Recent Labs  ?Lab 04/22/22 ?0025 04/23/22 ?[id number] 04/24/22 ?[id number]  ?NA 134* 136 134*  ?K 4.3 4.2 4.9  ?CL 98 101 101  ?CO2 30 28 25   ?GLUCOSE 155* 125* 115*  ?BUN 8 7 9   ?CREATININE 0.62 0.47 0.54  ?CALCIUM 8.2* 8.0* 8.1*  ?MG 1.8 1.7 1.7  ?PHOS 3.3 4.3 4.5  ? ? ?GFR: ?Estimated Creatinine Clearance: 162.8 mL/min (by C-G formula based on SCr of 0.54 mg/dL). ?Liver Function Tests: ?Recent Labs  ?Lab 04/22/22 ?0025 04/23/22 ?[id number] 04/24/22 ?[id number]  ?AST 15   --   --   ?ALT 9  --   --   ?ALKPHOS 90  --   --   ?BILITOT 0.8  --   --   ?PROT 7.4  --   --   ?ALBUMIN 1.9* 1.7* 1.8*  ? ? ?No results for input(s): LIPASE, AMYLASE in the last 168 hours. ?No results for input(s): AMMONIA in the last 168 hours. ?Coagulation Profile: ?Recent Labs  ?Lab 04/24/22 ?0434 04/25/22 ?0454 04/26/22 ?[id number] 04/27/22 ?[id number] 04/28/22 ?[id number]  ?INR 1.2 1.2 1.2 1.3* 1.3*  ? ? ?Cardiac Enzymes: ?No results for input(s): CKTOTAL, CKMB, CKMBINDEX, TROPONINI in the last 168 hours. ?BNP (last 3 results) ?No results for input(s): PROBNP in the last 8760 hours. ?HbA1C: ?No results for input(s): HGBA1C in the last 72 hours. ?CBG: ?No results for input(s): GLUCAP in the last 168 hours. ?Lipid Profile: ?No results for input(s): CHOL, HDL, LDLCALC, TRIG, CHOLHDL, LDLDIRECT in the last 72 hours. ?Thyroid Function Tests: ?No results for input(s): TSH, T4TOTAL, FREET4, T3FREE, THYROIDAB in the last 72 hours. ?Anemia Panel: ?No results for input(s): VITAMINB12, FOLATE, FERRITIN, TIBC, IRON, RETICCTPCT in the last 72 hours. ?Sepsis Labs: ?No results for input(s): PROCALCITON, LATICACIDVEN in the last 168 hours. ? ?Recent Results (from the past 240 hour(s))  ?Culture, blood (Routine X 2) w Reflex to ID Panel     Status: None  ? Collection Time: 04/19/22  2:22 PM  ? Specimen: BLOOD  ?Result Value Ref Range Status  ? Specimen Description   Final  ?  BLOOD BLOOD RIGHT HAND ?Performed at [facility], [place] [address] [place], [place] [address] ?  ? Special Requests   Final  ?  IN PEDIATRIC BOTTLE Blood Culture adequate volume ?Performed at [facility], [place] [address] [place], [place] [address] ?  ? Culture   Final  ?  NO GROWTH 5 DAYS ?Performed at [facility], [place] [address] [place], [place] [address] ?  ? Report Status 04/24/2022 FINAL  Final  ?Culture, blood (Routine X 2) w Reflex to ID Panel     Status: None  ? Collection Time: 04/19/22  2:35 PM  ? Specimen: BLOOD   ?Result Value Ref Range Status  ? Specimen Description   Final  ?  BLOOD BLOOD LEFT HAND ?Performed at [facility], [place] [address], [place], [place] [address] ?  ? Special Requests   Final  ?  BOTTLES DRAWN AEROBIC ONLY Blood Culture adequate volume ?Performed at [facility], [place] [address] [place], [place] [address] ?  ? Culture   Final  ?  NO GROWTH 5 DAYS ?Performed at [facility], [place] [address] [place], [place] [address] ?  ? Report Status 04/24/2022 FINAL  Final  ?Body fluid culture w Gram Stain     Status: None  ? Collection Time: 04/22/22  2:22 PM  ?  Specimen: Abdomen; Body Fluid  ?Result Value Ref Range Status  ? Specimen Description   Final  ?  ABDOMEN ?Performed at [facility], [place] [address] [place], [place] [address] ?  ? Special Requests   Final  ?  NONE RIGHT ABDOMINAL AND PELVIC MASS DRAINAGE ?Performed at [facility], [place] [address], [place], [place] [address] ?  ? Gram Stain   Final  ?  RARE WBC PRESENT,BOTH PMN AND MONONUCLEAR ?NO ORGANISMS SEEN ?  ? Culture   Final  ?  NO GROWTH 3 DAYS ?Performed at [facility], [place] [address] [place],  [address] ?  ? Report Status 04/27/2022 FINAL  Final  ? ?  ? ? ?Radiology Studies: ?No results found. ? ? ? ?Scheduled Meds: ? (feeding supplement) PROSource Plus  30 mL Oral BID BM  ? amLODipine  10 mg Oral q1800  ? amoxicillin-clavulanate  1 tablet Oral Q12H  ? chlorhexidine  15 mL Mouth Rinse BID  ? enoxaparin (LOVENOX) injection  220 mg Subcutaneous Q12H  ? fluconazole  800 mg Oral Daily  ? mouth rinse  15 mL Mouth Rinse q12n4p  ? pantoprazole  40 mg Oral Daily  ? Ensure Max Protein  11 oz Oral BID  ? senna-docusate  1 tablet Oral Daily  ? vitamin B-12  1,000 mcg Oral Daily  ? warfarin  5 mg Oral ONCE-1600  ? Warfarin - Pharmacist Dosing Inpatient   Does not apply [id number]  ? ?Continuous Infusions: ? ondansetron [facility]) IV 8 mg (04/24/22 1308)  ? ? ?   ?Assessment/Plan: ?  ?Right-sided abdominal wound pelvic cystic mass/abscess: S/p ultrasound-guided aspiration by IR and fluid analysis so far negative for malignant cells.  Only showed acute inflammatory cells

## 2022-04-28 NOTE — Plan of Care (Signed)
  Problem: Education: Goal: Knowledge of General Education information will improve Description Including pain rating scale, medication(s)/side effects and non-pharmacologic comfort measures Outcome: Progressing   Problem: Health Behavior/Discharge Planning: Goal: Ability to manage health-related needs will improve Outcome: Progressing   

## 2022-04-28 NOTE — Plan of Care (Signed)
  Problem: Coping: Goal: Level of anxiety will decrease Outcome: Progressing   Problem: Pain Managment: Goal: General experience of comfort will improve Outcome: Progressing   

## 2022-04-28 NOTE — Progress Notes (Signed)
ANTICOAGULATION CONSULT NOTE - Follow Up Consult ? ?Pharmacy Consult for enoxaparin, warfarin  ?Indication: h/o PE ? ?Allergies  ?Allergen Reactions  ? Adhesive [Tape] Anaphylaxis and Swelling  ?  *Adhesive Spray*   ? ? ?Patient Measurements: ?Height: 5\' 4"  (162.6 cm) ?Weight: (!) 217.7 kg (480 lb) ?IBW/kg (Calculated) : 54.7 ?Heparin Dosing Weight: 113 kg ? ?Vital Signs: ?Temp: 98 ?F (36.7 ?C) (05/07 0601) ?Temp Source: Oral (05/07 0601) ?BP: 138/75 (05/07 0601) ?Pulse Rate: 88 (05/07 0601) ? ?Labs: ?Recent Labs  ?  04/26/22 ?06/26/22 04/27/22 ?06/27/22 04/28/22 ?06/28/22  ?HGB 10.6* 10.6* 10.9*  ?HCT 34.8* 35.0* 35.7*  ?PLT 369 384 379  ?LABPROT 15.1 15.7* 16.2*  ?INR 1.2 1.3* 1.3*  ? ? ? ?Estimated Creatinine Clearance: 162.8 mL/min (by C-G formula based on SCr of 0.54 mg/dL). ? ?Assessment: ?AC/Heme: PTA warfarin for hx PE on hold for high INR (5.7 on admit) and possible cyst aspiration (INR must be <2.5)  ?- IV Vit K 5mg  given 4/28 ?- Notified by RN that IV pumps have a hard stop of upper limit for heparin gtt @ 39.9999 ml/hr, so increasing rate to 45 ml/hr is not possible - stop heparin and transition to enoxaparin ?- Pt was transitioned to enoxaparin 220 mg SQ q12h  ? ?Pt warfarin regimen is warfarin 5 mg daily except 2.5 mg on Monday and Friday. ? ? ?Today, 04/28/22  ?INR 1.3, subtherapeutic  ?LWMH is 0.46. Target range is 0.6 to 1. Times of administration were slightly off as pt was transported back and forth for TEE  ?Hgb, plt stable  ?DDI: new start fluconazole 1200 mg PO x1 then 800 mg PO daily , changed from micafungin (plan through 5/14) ? ? ?Goal of Therapy:  ?Heparin level 0.3-0.7 units/ml ?Monitor platelets by anticoagulation protocol: Yes ?  ?Plan:  ?Continue enoxaparin 220 mg SQ q12h  ?INR remains relatively unchanged, which is surprising given DDI with fluconazole AND supratherapeutic INR on admission in absence of any obvious DDI/dietary changes ?Would expect ~ 40% reduction in warfarin dose (2.5 mg daily)  to appropriately accommodate above, but INR should have likely increased more than it has by now ?Plan to dose more aggressively today with warfarin 5 mg ?Monitor daily INR, CBC ? ? ?06/28/22, PharmD, BCPS ?Clinical Pharmacist ?04/28/2022 10:01 AM ? ? ? ? ?

## 2022-04-29 ENCOUNTER — Other Ambulatory Visit (HOSPITAL_COMMUNITY): Payer: Self-pay

## 2022-04-29 LAB — CBC
HCT: 36 % (ref 36.0–46.0)
Hemoglobin: 10.7 g/dL — ABNORMAL LOW (ref 12.0–15.0)
MCH: 26.4 pg (ref 26.0–34.0)
MCHC: 29.7 g/dL — ABNORMAL LOW (ref 30.0–36.0)
MCV: 88.7 fL (ref 80.0–100.0)
Platelets: 271 10*3/uL (ref 150–400)
RBC: 4.06 MIL/uL (ref 3.87–5.11)
RDW: 17.4 % — ABNORMAL HIGH (ref 11.5–15.5)
WBC: 8.1 10*3/uL (ref 4.0–10.5)
nRBC: 0 % (ref 0.0–0.2)

## 2022-04-29 LAB — PROTIME-INR
INR: 1.3 — ABNORMAL HIGH (ref 0.8–1.2)
Prothrombin Time: 16.4 seconds — ABNORMAL HIGH (ref 11.4–15.2)

## 2022-04-29 MED ORDER — WARFARIN SODIUM 5 MG PO TABS: ORAL_TABLET | ORAL | 1 refills | Status: DC

## 2022-04-29 MED ORDER — ENOXAPARIN SODIUM 100 MG/ML IJ SOSY
200.0000 mg | PREFILLED_SYRINGE | Freq: Two times a day (BID) | INTRAMUSCULAR | 0 refills | Status: DC
  Filled 2022-04-29: qty 20, 5d supply, fill #0

## 2022-04-29 MED ORDER — CYANOCOBALAMIN 1000 MCG PO TABS
1000.0000 ug | ORAL_TABLET | Freq: Every day | ORAL | 0 refills | Status: DC
  Filled 2022-04-29: qty 30, 30d supply, fill #0

## 2022-04-29 MED ORDER — HYDROCODONE-ACETAMINOPHEN 5-325 MG PO TABS
1.0000 | ORAL_TABLET | Freq: Two times a day (BID) | ORAL | 0 refills | Status: DC | PRN
  Filled 2022-04-29: qty 10, 5d supply, fill #0

## 2022-04-29 MED ORDER — WARFARIN SODIUM 6 MG PO TABS
6.0000 mg | ORAL_TABLET | Freq: Once | ORAL | Status: DC
  Filled 2022-04-29: qty 1

## 2022-04-29 NOTE — Discharge Summary (Signed)
Physician Discharge Summary  ?CLELA TAYS [id number] DOB: 04/18/1976 DOA: 04/15/2022 ? ?PCP: Elsie Stain, MD ? ?Admit date: 04/15/2022 ?Discharge date: 04/29/2022 ?Recommendations for Outpatient Follow-up:  ?Follow up with PCP in 1 weeks-call for appointment ?Please obtain BMP/CBC in one week ? ?Discharge Dispo: home health ?Discharge Condition: Stable ?Code Status:   Code Status: Full Code ?Diet recommendation:  ?Diet Order   ? ?       ?  Diet Heart Room service appropriate? Yes; Fluid consistency: Thin  Diet effective now       ?  ? ?  ?  ? ?  ?  ? ?Brief/Interim Summary: ?46 year old F with PMH of DVT/PE on warfarin, morbid obesity, lymphedema, anxiety and depression presenting with nausea, vomiting and abdominal pain, and admitted for abdominal pain.  CT abdomen and pelvis showed interval progression of the large cystic mass in the right abdomen and pelvis measuring about 24 x 24 x 24 cm and hepatic cirrhosis but no acute finding.  She had leukocytosis to 17.6.  INR was supratherapeutic.  She was started on IV Zosyn.  IR consulted and planning drainage once INR allows. ?Blood culture with Candida parapsilosis in 1 out of 2 bottles.  ID consulted and started micafungin in addition to IV Zosyn.  TTE without significant finding of vegetation.  ID recommending TEE.  Repeat blood culture on 4/28 NGTD. ?INR reversed with FFP and vitamin K.  She had US guided aspiration of large abdominal cystic mass that yielded 7.2 L brown debris-filled cystic fluid on 5/1.TEE on 5/2 without vegetation. ?Fluid gram stain negative culture no growth so far cytology/pathology- no malignant cells but numerous acute inflammatory cells and necrotic debris compatible with abscess. Marland KitchenRestarted warfarin with Lovenox bridge and awaiting INR to be therapeutic-at this time patient feels comfortable administering Lovenox which she has been doing since Friday, PCP has agreed to see her this week to monitor INR, advised to monitor at least  every 48 hours/s, the Lovenox-patient was explained in detail the risk of being on Lovenox if INR is above therapeutic/supratherapeutic-so INR needs to be followed very closely discussed with pharmacy we will dose her with Coumadin 5 mg daily moving forward and PCP will adjust her dose further ?I talked with ophthalmology Dr. Katy Fitch who will arrange for follow-up for ophthalmology evaluation  04/30/22- he was provided with patient's details and phone number.  ? ?Discharge Diagnoses:  ?Principal Problem: ?  Abdominal pain due to right abdominal and pelvic cystic mass ?Active Problems: ?  Candidemia ([place]) ?  Supratherapeutic INR ?  History of pulmonary embolism ?  Right leg DVT ([place]) ?  Morbid obesity with body mass index of 70 and over in adult [facility]) ?  Normocytic anemia ?  Leukocytosis ?  Hypertension ?  GERD (gastroesophageal reflux disease) ?  Lymphedema of both lower extremities ?  Right abdominal and pelvic cystic mass ?  Hyponatremia ?  Vitamin B12 deficiency ?  Herpes simplex labialis ?  Hypomagnesemia ? ?Right-sided abdominal wound pelvic cystic mass/abscess: S/p ultrasound-guided aspiration by IR and fluid analysis so far negative for malignant cells.  Only showed acute inflammatory cells and necrotic debris is compatible with abscess.  Blood cultures positive for Candida initially, repeat blood cultures on 4/28 negative.  Afebrile, WBC normalized.  Augmentin/diclofenac to complete 14-day course per ID.  Called in prescription to [facility] but apparently closed on Sunday.  She will complete the course through 05/06/22.  Requesting hydrocodone we will give short  supplies to manage severe pain and advised to minimize the use of narcotics due to risk of tolerance/abuse potential, should use nonnarcotic regimen and follow-up with PCP ? ?Candidemia: Seen by ID.  TEE negative for vegetations.  Initially on IV micafungin->converted to p.o. Diflucan 800 mg for 14-day course.  She will follow-up  with ophthalmology Dr. Katy Fitch 04/30/22 ? ?History of DVT/PE and coagulopathy on admission: Patient on Coumadin at baseline.  Initially on presentation, patient with supratherapeutic INR at 5.9.  In anticipation of procedures, vitamin K 5 mg and FFP x2 given.  INR now subtherapeutic and patient receiving bridging Lovenox. Since most recent CTA and ultrasonogram lower extremity negative for any acute thrombosis few weeks back, probably reasonable to send patient home with self-administered bridging Lovenox/Coumadin - ?PCP has agreed to see her this week to monitor INR, advised to monitor at least every 48 hours/s, the Lovenox-patient was explained in detail the risk of being on Lovenox if INR is above therapeutic/supratherapeutic-so INR needs to be followed very closely discussed with pharmacy we will dose her with Coumadin 5 mg daily moving forward and PCP will adjust her dose furthe ?Recent Labs  ?Lab 04/25/22 ?0454 04/26/22 ?[id number] 04/27/22 ?[id number] 04/28/22 ?[id number] 04/29/22 ?[id number]  ?INR 1.2 1.2 1.3* 1.3* 1.3*  ?  ?Hyponatremia: Now resolved with IV hydration. ?GERD PPI ?Hypertension: cont on home meds ?Morbid obesity:Patient's Body mass index is 82.39 kg/m?. : Will benefit with PCP follow-up, weight loss healthy lifestyle and outpatient sleep evaluation ?TOC consulted to ensure patient has a appropriate follow-up and also Lovenox for home ?Patient endorsed she reached out to PCP and has a follow-up for Thursday and for next week week and her doctor will monitor INR ? ?Consults: ?ID, ophthalmology, wound care ?Subjective: ?Alert awake oriented comfortable, desires to go home today, has been doing Lovenox by herself. ? ?Discharge Exam: ?Vitals:  ? 04/29/22 0455 04/29/22 1004  ?BP: 122/64 136/76  ?Pulse: 89 (!) 105  ?Resp: 18 15  ?Temp: 98.3 ?F (36.8 ?C) 98.2 ?F (36.8 ?C)  ?SpO2: 95% 97%  ? ?General: Pt is alert, awake, not in acute distress ?Cardiovascular: RRR, S1/S2 +, no rubs, no gallops ?Respiratory: CTA bilaterally, no  wheezing, no rhonchi ?Abdominal: Soft, NT, ND, bowel sounds + ?Extremities: no edema, no cyanosis ? ?Discharge Instructions ? ?Discharge Instructions   ? ? Discharge instructions   Complete by: As directed ?  ? Continue Lovenox until INR is close to 2, monitor INR every 48 hours follow-up with PCP to adjust the dose of Coumadin.   ?Follow-up with ophthalmology-Dr. Katy Fitch office has been called and is aware please call his office today for follow-up for 05/01/2019 ? ?Please call call MD or return to ER for similar or worsening recurring problem that brought you to hospital or if any fever,nausea/vomiting,abdominal pain, uncontrolled pain, chest pain,  shortness of breath or any other alarming symptoms. ? ?Please follow-up your doctor as instructed in a week time and call the office for appointment. ? ?Please avoid alcohol, smoking, or any other illicit substance and maintain healthy habits including taking your regular medications as prescribed. ? ?You were cared for by a hospitalist during your hospital stay. If you have any questions about your discharge medications or the care you received while you were in the hospital after you are discharged, you can call the unit and ask to speak with the hospitalist on call if the hospitalist that took care of you is not available. ? ?Once you are discharged, your primary  care physician will handle any further medical issues. Please note that NO REFILLS for any discharge medications will be authorized once you are discharged, as it is imperative that you return to your primary care physician (or establish a relationship with a primary care physician if you do not have one) for your aftercare needs so that they can reassess your need for medications and monitor your lab values  ? Increase activity slowly   Complete by: As directed ?  ? ?  ? ?Allergies as of 04/29/2022   ? ?   Reactions  ? Adhesive [tape] Anaphylaxis, Swelling  ? *Adhesive Spray*   ? ?  ? ?  ?Medication List  ?   ? ?TAKE these medications   ? ?acetaminophen 325 MG tablet ?Commonly known as: TYLENOL ?Take 2 tablets (650 mg total) by mouth every 6 (six) hours as needed for mild pain (or Fever >/= 101). ?Notes to patient:

## 2022-04-29 NOTE — Progress Notes (Addendum)
Provided discharge education/instructions, all questions and concerns addressed. Pt not in acute distress, to discharge home with belongings accompanied by her mother. ? ?Meds from [facility] outpatient pharmacy handed to Pt.  ?

## 2022-04-29 NOTE — Plan of Care (Signed)

## 2022-04-29 NOTE — TOC Transition Note (Signed)
Transition of Care (TOC) - CM/SW Discharge Note ? ?Patient Details  ?Name: Susan Warren ?MRN: [id number] ?Date of Birth: Sep 08, 1976 ? ?Transition of Care (TOC) CM/SW Contact:  ?Ewing Schlein, LCSW ?Phone Number: ?04/29/2022, 1:18 PM ? ?Clinical Narrative: Patient is having difficulty getting scheduled at her PCP's office for INR checks for Wed, Fri, and Mon but can be seen Thurs and Mon. [facility] on [facility] will not allow the patient to set up her own appointments there. CSW attempted to set up [facility] for INR checks at home, but patient was declined by [facility] and [facility] as patient is not homebound and does not meet criteria for [facility] services. CSW attempted to reach the [facility] anticoagulation clinic, but was unable to reach anyone to set up follow up appointments. TOC signing off. ? ?Final next level of care: Home/Self Care ?Barriers to Discharge: No Home Care Agency will accept this patient ? ?Discharge Plan and Services       ?DME Arranged: N/A ?DME Agency: NA ? ?Readmission Risk Interventions ?   ? View : No data to display.  ?  ?  ?  ? ?

## 2022-04-29 NOTE — Progress Notes (Signed)
ANTICOAGULATION CONSULT NOTE - Follow Up Consult ? ?Pharmacy Consult for Warfarin ?Indication: DVT ? ?Allergies  ?Allergen Reactions  ? Adhesive [Tape] Anaphylaxis and Swelling  ?  *Adhesive Spray*   ? ? ?Patient Measurements: ?Height: 5\' 4"  (162.6 cm) ?Weight: (!) 217.7 kg (480 lb) ?IBW/kg (Calculated) : 54.7 ? ?Vital Signs: ?Temp: 98.3 ?F (36.8 ?C) (05/08 0455) ?Temp Source: Oral (05/08 0455) ?BP: 122/64 (05/08 0455) ?Pulse Rate: 89 (05/08 0455) ? ?Labs: ?Recent Labs  ?  04/27/22 ?0336 04/28/22 ?0333 04/29/22 ?0326  ?HGB 10.6* 10.9* 10.7*  ?HCT 35.0* 35.7* 36.0  ?PLT 384 379 271  ?LABPROT 15.7* 16.2* 16.4*  ?INR 1.3* 1.3* 1.3*  ? ? ?Estimated Creatinine Clearance: 162.8 mL/min (by C-G formula based on SCr of 0.54 mg/dL). ? ? ?Assessment: ?AC/Heme: PTA warfarin for hx PE resumed ?- IV Vit K 5mg  given 4/28 ?- Start IV heparin 4/29> LMWH for high hep requirements. - LMWH level is 0.46, keep dose for now (5/3) ?- 5/8: INR 1.3. Hgb 10.7 stable Plts 271 down. ? ?- PTA warfarin dose: 2.5mg  MF and 5mg  all other days ? ?Goal of Therapy:  ?INR 2-3 ?Monitor platelets by anticoagulation protocol: Yes ?  ?Plan:  ?Warfarin per Rx + Lovenox 220mg  sq q12h ?Coumadin 6mg  po x 1 tonight ?Daily INR. ? ? ?Gianni Mihalik S. 02-22-1988, PharmD, BCPS ?Clinical Staff Pharmacist ?[email or web address] ? ?7/8, ?04/29/2022,8:00 AM ? ? ?

## 2022-04-30 ENCOUNTER — Telehealth: Payer: Self-pay

## 2022-04-30 ENCOUNTER — Ambulatory Visit: Payer: 59 | Admitting: Critical Care Medicine

## 2022-04-30 NOTE — Telephone Encounter (Signed)
Transition Care Management Follow-up Telephone Call ?Date of discharge and from where: 04/29/2022, [facility]  ?How have you been since you were released from the hospital? She stated that she is just feeling tired  ?Any questions or concerns? No ? ?Items Reviewed: ?Did the pt receive and understand the discharge instructions provided? Yes  ?Medications obtained and verified? Yes  - she said she has all of her medications and did not have any questions about the med regime. She is self administering the lovenox and has already discussed her plan for INR follow up and coumadin management with Butch Penny, RPH. ?Other? No  ?Any new allergies since your discharge? No  ?Dietary orders reviewed? No ?Do you have support at home? Yes  ? ?Home Care and Equipment/Supplies: ?Were home health services ordered? no ?If so, what is the name of the agency? N/a  ?Has the agency set up a time to come to the patient's home? not applicable ?Were any new equipment or medical supplies ordered?  No ?What is the name of the medical supply agency? N/a ?Were you able to get the supplies/equipment? not applicable ?Do you have any questions related to the use of the equipment or supplies? No ? ?Functional Questionnaire: (I = Independent and D = Dependent) ?ADLs: independent ? ? ?Follow up appointments reviewed: ? ?PCP Hospital f/u appt confirmed? Yes  Scheduled to see Dr Delford Field - 05/06/2022.  ?Specialist Hospital f/u appt confirmed?  [facility] [place], [place]- INR check 05/02/2022.    ?Are transportation arrangements needed? No  ?If their condition worsens, is the pt aware to call PCP or go to the Emergency Dept.? Yes ?Was the patient provided with contact information for the PCP's office or ED? Yes ?Was to pt encouraged to call back with questions or concerns? Yes ? ?

## 2022-05-02 ENCOUNTER — Ambulatory Visit: Payer: 59 | Attending: Critical Care Medicine | Admitting: Pharmacist

## 2022-05-02 DIAGNOSIS — I82511 Chronic embolism and thrombosis of right femoral vein: Secondary | ICD-10-CM | POA: Diagnosis not present

## 2022-05-02 LAB — POCT INR: INR: 3.7 — AB (ref 2.0–3.0)

## 2022-05-04 NOTE — Progress Notes (Addendum)
? ?Established Patient Office Visit ? ?Subjective   ?Patient ID: Susan Warren, female    DOB: 04-10-1976  Age: 46 y.o. MRN: [id number] ? ?Chief Complaint  ?Patient presents with  ? Hospitalization Follow-up  ? Medication Refill  ? ? ?This is a transition of care post hospital visit.  Note there was already a transition of care visit with the RN on May 9 ?This patient has not been to see me as a primary care provider since May 2020.  In the interim the patient had 2 admissions in April for right lower abdominal pain the second admission is documented as below. ? ?The patient had a drainage procedure performed of the right lower abscessed cystic mass.  This showed Candida infection and blood cultures were positive as well.  Note she had a previous complex cyst below the right lobe of the liver previously seen in 2018 on a renal CT scan study.  The cystic mass is larger now.  Etiology of this is never been discerned.  The patient was given prolonged course of intravenous antifungals and also broad-spectrum anaerobic coverage.  She was consulted by infectious disease.  She was then switched to oral Augmentin and also Diflucan.  She is finished her Augmentin over the weekend and has 1 dose of 800 mg Diflucan left.  She had significant side effects from the Diflucan with shakiness and tremor. ? ?On arrival blood pressure was 128/95.  She states her abdominal pain is improved.  Blood pressure is being controlled with amlodipine.  She needs refills on same.  She does note some anxiety and would like a behavioral health referral. ? ?INR today was 7.4.  Note it was 1.7 on discharge and then on Thursday of last week up to 3.7 likely due to the effects of ongoing use of Augmentin.  Now the Augmentin is off. ? ?[facility]  See separate documentation from clinical pharmacy for warfarin management. ? ?Note patient is due a Pap smear and also a colonoscopy. ? ?Note the patient has  been to see ophthalmology retinal exam was unremarkable for Candida involvement of the retina this occurred post discharge ? ?Below is a copy of the discharge summary ? ?Admit date: 04/15/2022 ?Discharge date: 04/29/2022 ?Recommendations for Outpatient Follow-up:  ?1. Follow up with PCP in 1 weeks-call for appointment ?2. Please obtain BMP/CBC in one week ?  ?Discharge Dispo: home health ?Discharge Condition: Stable ?  ?Brief/Interim Summary: ?46 year old F with PMH of DVT/PE on warfarin, morbid obesity, lymphedema, anxiety and depression presenting with nausea, vomiting and abdominal pain, and admitted for abdominal pain.  CT abdomen and pelvis showed interval progression of the large cystic mass in the right abdomen and pelvis measuring about 24 x 24 x 24 cm and hepatic cirrhosis but no acute finding.  She had leukocytosis to 17.6.  INR was supratherapeutic.  She was started on IV Zosyn.  IR consulted and planning drainage once INR allows. ?Blood culture with Candida parapsilosis in 1 out of 2 bottles.  ID consulted and started micafungin in addition to IV Zosyn.  TTE without significant finding of vegetation.  ID recommending TEE.  Repeat blood culture on 4/28 NGTD. ?INR reversed with FFP and vitamin K.  She had US guided aspiration of large abdominal cystic mass that yielded 7.2 L brown debris-filled cystic fluid on 5/1.TEE on 5/2 without vegetation. ?Fluid gram stain negative culture no growth so far cytology/pathology- no malignant cells but numerous acute inflammatory  cells and necrotic debris compatible with abscess. Marland KitchenRestarted warfarin with Lovenox bridge and awaiting INR to be therapeutic-at this time patient feels comfortable administering Lovenox which she has been doing since Friday, PCP has agreed to see her this week to monitor INR, advised to monitor at least every 48 hours/s, the Lovenox-patient was explained in detail the risk of being on Lovenox if INR is above therapeutic/supratherapeutic-so INR  needs to be followed very closely discussed with pharmacy we will dose her with Coumadin 5 mg daily moving forward and PCP will adjust her dose further ?I talked with ophthalmology Dr. Dione Booze who will arrange for follow-up for ophthalmology evaluation  04/30/22- he was provided with patient's details and phone number.  ?  ?Discharge Diagnoses:  ?Principal Problem: ?  Abdominal pain due to right abdominal and pelvic cystic mass ?Active Problems: ?  Candidemia ([facility]) ?  Supratherapeutic INR ?  History of pulmonary embolism ?  Right leg DVT ([facility]) ?  Morbid obesity with body mass index of 70 and over in adult [facility]) ?  Normocytic anemia ?  Leukocytosis ?  Hypertension ?  GERD (gastroesophageal reflux disease) ?  Lymphedema of both lower extremities ?  Right abdominal and pelvic cystic mass ?  Hyponatremia ?  Vitamin B12 deficiency ?  Herpes simplex labialis ?  Hypomagnesemia ?  ?Right-sided abdominal wound pelvic cystic mass/abscess: S/p ultrasound-guided aspiration by IR and fluid analysis so far negative for malignant cells.  Only showed acute inflammatory cells and necrotic debris is compatible with abscess.  Blood cultures positive for Candida initially, repeat blood cultures on 4/28 negative.  Afebrile, WBC normalized.  Augmentin/diclofenac to complete 14-day course per ID.  Called in prescription to [facility] outpatient pharmacy but apparently closed on Sunday.  She will complete the course through 05/06/22.  Requesting hydrocodone we will give short supplies to manage severe pain and advised to minimize the use of narcotics due to risk of tolerance/abuse potential, should use nonnarcotic regimen and follow-up with PCP ?  ?Candidemia: Seen by ID.  TEE negative for vegetations.  Initially on IV micafungin->converted to p.o. Diflucan 800 mg for 14-day course.  She will follow-up with ophthalmology Dr. Dione Booze 04/30/22 ?  ?History of DVT/PE and coagulopathy on admission: Patient on Coumadin at baseline.  Initially on  presentation, patient with supratherapeutic INR at 5.9.  In anticipation of procedures, vitamin K 5 mg and FFP x2 given.  INR now subtherapeutic and patient receiving bridging Lovenox. Since most recent CTA and ultrasonogram lower extremity negative for any acute thrombosis few weeks back, probably reasonable to send patient home with self-administered bridging Lovenox/Coumadin - ?PCP has agreed to see her this week to monitor INR, advised to monitor at least every 48 hours/s, the Lovenox-patient was explained in detail the risk of being on Lovenox if INR is above therapeutic/supratherapeutic-so INR needs to be followed very closely discussed with pharmacy we will dose her with Coumadin 5 mg daily moving forward and PCP will adjust her dose furthe ?Last Labs  ?Recent Labs ?Lab 04/25/22 ?0454 04/26/22 ?1610 04/27/22 ?9604 04/28/22 ?5409 04/29/22 ?8119 ?INR 1.2 1.2 1.3* 1.3* 1.3* ? ?  ?Hyponatremia: Now resolved with IV hydration. ?GERD PPI ?Hypertension: cont on home meds ?Morbid obesity:Patient's Body mass index is 82.39 kg/m?. : Will benefit with PCP follow-up, weight loss healthy lifestyle and outpatient sleep evaluation ?TOC consulted to ensure patient has a appropriate follow-up and also Lovenox for home ?Patient endorsed she reached out to PCP and has a follow-up for Thursday  and for next week week and her doctor will monitor INR ?  ?Consults: ?? ID, ophthalmology, wound care ? ? ? ? ? ?Review of Systems  ?Constitutional:  Negative for chills, diaphoresis, fever, malaise/fatigue and weight loss.  ?HENT:  Negative for congestion, hearing loss, nosebleeds, sore throat and tinnitus.   ?Eyes:  Negative for blurred vision, photophobia and redness.  ?Respiratory:  Negative for cough, hemoptysis, sputum production, shortness of breath, wheezing and stridor.   ?Cardiovascular:  Negative for chest pain, palpitations, orthopnea, claudication, leg swelling and PND.  ?Gastrointestinal:  Negative for abdominal pain, blood  in stool, constipation, diarrhea, heartburn, nausea and vomiting.  ?Genitourinary:  Negative for dysuria, flank pain, frequency, hematuria and urgency.  ?Musculoskeletal:  Negative for back pain, falls, joint pain

## 2022-05-06 ENCOUNTER — Encounter: Payer: Self-pay | Admitting: Critical Care Medicine

## 2022-05-06 ENCOUNTER — Ambulatory Visit: Payer: 59 | Attending: Critical Care Medicine | Admitting: Critical Care Medicine

## 2022-05-06 VITALS — BP 128/95 | HR 114 | Wt >= 6400 oz

## 2022-05-06 DIAGNOSIS — D72825 Bandemia: Secondary | ICD-10-CM

## 2022-05-06 DIAGNOSIS — I10 Essential (primary) hypertension: Secondary | ICD-10-CM

## 2022-05-06 DIAGNOSIS — F419 Anxiety disorder, unspecified: Secondary | ICD-10-CM

## 2022-05-06 DIAGNOSIS — Z139 Encounter for screening, unspecified: Secondary | ICD-10-CM

## 2022-05-06 DIAGNOSIS — B377 Candidal sepsis: Secondary | ICD-10-CM

## 2022-05-06 DIAGNOSIS — R791 Abnormal coagulation profile: Secondary | ICD-10-CM

## 2022-05-06 DIAGNOSIS — R1084 Generalized abdominal pain: Secondary | ICD-10-CM

## 2022-05-06 DIAGNOSIS — Z1159 Encounter for screening for other viral diseases: Secondary | ICD-10-CM

## 2022-05-06 DIAGNOSIS — F3341 Major depressive disorder, recurrent, in partial remission: Secondary | ICD-10-CM

## 2022-05-06 DIAGNOSIS — K7689 Other specified diseases of liver: Secondary | ICD-10-CM | POA: Diagnosis not present

## 2022-05-06 DIAGNOSIS — I82501 Chronic embolism and thrombosis of unspecified deep veins of right lower extremity: Secondary | ICD-10-CM

## 2022-05-06 DIAGNOSIS — Z86711 Personal history of pulmonary embolism: Secondary | ICD-10-CM

## 2022-05-06 DIAGNOSIS — Z1211 Encounter for screening for malignant neoplasm of colon: Secondary | ICD-10-CM

## 2022-05-06 DIAGNOSIS — I82511 Chronic embolism and thrombosis of right femoral vein: Secondary | ICD-10-CM

## 2022-05-06 DIAGNOSIS — R1900 Intra-abdominal and pelvic swelling, mass and lump, unspecified site: Secondary | ICD-10-CM

## 2022-05-06 LAB — CULTURE, BLOOD (ROUTINE X 2): Special Requests: ADEQUATE

## 2022-05-06 LAB — POCT INR: INR: 7.4 — AB (ref 2.0–3.0)

## 2022-05-06 MED ORDER — SERTRALINE HCL 50 MG PO TABS: 50.0000 mg | ORAL_TABLET | Freq: Every day | ORAL | 3 refills | Status: DC

## 2022-05-06 MED ORDER — CYANOCOBALAMIN 1000 MCG PO TABS: 1000.0000 ug | ORAL_TABLET | Freq: Every day | ORAL | 0 refills | Status: DC

## 2022-05-06 MED ORDER — AMLODIPINE BESYLATE 10 MG PO TABS: 10.0000 mg | ORAL_TABLET | Freq: Every day | ORAL | 2 refills | Status: DC

## 2022-05-06 NOTE — Assessment & Plan Note (Signed)
I had made note of this hepatic cyst back in April 2020 and she got lost to follow-up.  She has been through a lot the last 6 months she lost her husband.  She had quite a bit of anxiety and depression with this. ? ?The patient states that she never did get with general surgery. ? ?Now that she has had this complicated cyst develop an infection I would like gastroenterology hepatology opinion.  They feel a general surgery consult is indicated we will proceed with same. ?

## 2022-05-06 NOTE — Assessment & Plan Note (Signed)
Likely related to prior hepatic complex cyst ? ?Referral to gastroenterology made  ? ?Referral to infectious disease and follow-up made ? ? ?

## 2022-05-06 NOTE — Patient Instructions (Addendum)
From [place]: You stop the antibiotics later this week. Stopping the antibiotics will help your INR stabilize. For now, hold Coumadin until you can return and see [place] for a repeat check on Thursday.   ? ? ?Start sertraline one daily ? ?STOP diflucan fluconazole ? ? Referrals to liver specialist and infectious disease made ? ?A pap smear will be obtained by one of our providers ? ?Cologuard kit will be obtained ? ?Referral to psychiatry will be made ? ?Return Dr Joya Gaskins 6 weeks ?

## 2022-05-06 NOTE — Assessment & Plan Note (Addendum)
We will need to stop Diflucan she only had 1 dose left ago will refer back to infectious disease for follow-up ? ?The patient was having adverse side effects due to the Diflucan ? ?Note work-up in the hospital showed negative transesophageal echo and ophthalmology shows no evidence of Candida in the posterior eyegrounds ? ? ?

## 2022-05-06 NOTE — Assessment & Plan Note (Signed)
Chronic thromboembolism of the left leg and prior recurrent pulmonary emboli with cor pulmonale.  Patient is now on warfarin for life. ? ?Currently patient has effects of antibiotics and cannot resume warfarin.  Plan is to hold warfarin again till Thursday of this week will be seen again warfarin clinic and at that point make decisions regarding patient's dosing of warfarin ?

## 2022-05-06 NOTE — Assessment & Plan Note (Signed)
As per warfarin clinic ?

## 2022-05-06 NOTE — Assessment & Plan Note (Signed)
As per deep venous thrombosis refreshment ?

## 2022-05-06 NOTE — Assessment & Plan Note (Signed)
Follow-up blood counts ?

## 2022-05-06 NOTE — Assessment & Plan Note (Signed)
History of chronic depression and anxiety we will begin sertraline 50 mg daily and make a referral to behavioral health ?

## 2022-05-06 NOTE — Assessment & Plan Note (Signed)
Improved with drainage of pelvic cystic mass ?

## 2022-05-06 NOTE — Assessment & Plan Note (Signed)
As per depression assessment 

## 2022-05-07 LAB — LIPID PANEL
Chol/HDL Ratio: 4.6 ratio — ABNORMAL HIGH (ref 0.0–4.4)
Cholesterol, Total: 167 mg/dL (ref 100–199)
HDL: 36 mg/dL — ABNORMAL LOW (ref >39)
LDL Chol Calc (NIH): 102 mg/dL — ABNORMAL HIGH (ref 0–99)
Triglycerides: 167 mg/dL — ABNORMAL HIGH (ref 0–149)
VLDL Cholesterol Cal: 29 mg/dL (ref 5–40)

## 2022-05-07 LAB — HCV AB W REFLEX TO QUANT PCR: HCV Ab: NONREACTIVE

## 2022-05-07 LAB — CBC WITH DIFFERENTIAL/PLATELET
Basophils Absolute: 0.1 10*3/uL (ref 0.0–0.2)
Basos: 1 %
EOS (ABSOLUTE): 0.3 10*3/uL (ref 0.0–0.4)
Eos: 3 %
Hematocrit: 36.1 % (ref 34.0–46.6)
Hemoglobin: 11.6 g/dL (ref 11.1–15.9)
Immature Grans (Abs): 0 10*3/uL (ref 0.0–0.1)
Immature Granulocytes: 0 %
Lymphocytes Absolute: 1.8 10*3/uL (ref 0.7–3.1)
Lymphs: 21 %
MCH: 26.3 pg — ABNORMAL LOW (ref 26.6–33.0)
MCHC: 32.1 g/dL (ref 31.5–35.7)
MCV: 82 fL (ref 79–97)
Monocytes Absolute: 0.6 10*3/uL (ref 0.1–0.9)
Monocytes: 7 %
Neutrophils Absolute: 5.7 10*3/uL (ref 1.4–7.0)
Neutrophils: 68 %
Platelets: 609 10*3/uL — ABNORMAL HIGH (ref 150–450)
RBC: 4.41 x10E6/uL (ref 3.77–5.28)
RDW: 16.3 % — ABNORMAL HIGH (ref 11.7–15.4)
WBC: 8.5 10*3/uL (ref 3.4–10.8)

## 2022-05-07 LAB — COMPREHENSIVE METABOLIC PANEL
ALT: 14 IU/L (ref 0–32)
AST: 18 IU/L (ref 0–40)
Albumin/Globulin Ratio: 0.7 — ABNORMAL LOW (ref 1.2–2.2)
Albumin: 3.5 g/dL — ABNORMAL LOW (ref 3.8–4.8)
Alkaline Phosphatase: 109 IU/L (ref 44–121)
BUN/Creatinine Ratio: 16 (ref 9–23)
BUN: 13 mg/dL (ref 6–24)
Bilirubin Total: 0.5 mg/dL (ref 0.0–1.2)
CO2: 20 mmol/L (ref 20–29)
Calcium: 9.2 mg/dL (ref 8.7–10.2)
Chloride: 98 mmol/L (ref 96–106)
Creatinine, Ser: 0.82 mg/dL (ref 0.57–1.00)
Globulin, Total: 4.7 g/dL — ABNORMAL HIGH (ref 1.5–4.5)
Glucose: 138 mg/dL — ABNORMAL HIGH (ref 70–99)
Potassium: 4.5 mmol/L (ref 3.5–5.2)
Sodium: 139 mmol/L (ref 134–144)
Total Protein: 8.2 g/dL (ref 6.0–8.5)
eGFR: 88 mL/min/{1.73_m2} (ref >59)

## 2022-05-07 LAB — HCV INTERPRETATION

## 2022-05-08 ENCOUNTER — Telehealth: Payer: Self-pay

## 2022-05-08 NOTE — Telephone Encounter (Signed)
Pt was called and vm was left, Information has been sent to nurse pool.   

## 2022-05-08 NOTE — Telephone Encounter (Signed)
-----   Message from Storm Frisk, MD sent at 05/07/2022  6:00 AM EDT ----- ?Let pt know all labs are normal except she has high cholesterol, she should follow healthy low fat diet.  Hep C was negative.    ?

## 2022-05-09 ENCOUNTER — Ambulatory Visit: Payer: 59 | Attending: Critical Care Medicine | Admitting: Pharmacist

## 2022-05-09 DIAGNOSIS — I82501 Chronic embolism and thrombosis of unspecified deep veins of right lower extremity: Secondary | ICD-10-CM

## 2022-05-09 LAB — POCT INR: INR: 5.4 — AB (ref 2.0–3.0)

## 2022-05-13 ENCOUNTER — Ambulatory Visit: Payer: 59 | Attending: Critical Care Medicine | Admitting: Pharmacist

## 2022-05-13 DIAGNOSIS — I82501 Chronic embolism and thrombosis of unspecified deep veins of right lower extremity: Secondary | ICD-10-CM | POA: Diagnosis not present

## 2022-05-13 LAB — POCT INR: INR: 5.4 — AB (ref 2.0–3.0)

## 2022-05-15 ENCOUNTER — Other Ambulatory Visit: Payer: Self-pay

## 2022-05-15 ENCOUNTER — Encounter: Payer: Self-pay | Admitting: Internal Medicine

## 2022-05-15 ENCOUNTER — Ambulatory Visit (INDEPENDENT_AMBULATORY_CARE_PROVIDER_SITE_OTHER): Payer: 59 | Admitting: Internal Medicine

## 2022-05-15 VITALS — BP 136/76 | HR 105 | Temp 97.4°F | Wt >= 6400 oz

## 2022-05-15 DIAGNOSIS — B377 Candidal sepsis: Secondary | ICD-10-CM | POA: Diagnosis not present

## 2022-05-15 NOTE — Progress Notes (Signed)
   Subjective:    Patient ID: Susan Warren, female    DOB: 01-May-1976, 46 y.o.   MRN: [id number]  HPI She is her for hospital follow up with Candidemia. She was admitted inApril with abdominal pain and found a cyst in her abdomen/pelvis and IR drainage cw an abscess.  Culture from the abscess remained negative but 1 blood culture was positive for Candida parapsilosis. Sensitvities noted and is fluconazole sensitive.  She was sent out on high dose fluconazole for 14 days.  She had a drain placed by IR on 5/1 after her INR normalized some and 7.2 L aspirated.  Path was most c/w abscess.  Saw Dr. Katy Fitch as an outpatient and no issues with her eyes/endophthalmitis.     Review of Systems  Constitutional:  Negative for chills, fatigue and fever.  Gastrointestinal:  Negative for diarrhea and nausea.  Skin:  Negative for rash.      Objective:   Physical Exam Eyes:     General: No scleral icterus. Pulmonary:     Effort: Pulmonary effort is normal.  Skin:    Findings: No rash.  Neurological:     General: No focal deficit present.     Mental Status: She is alert.   SH: no tobacco       Assessment & Plan:

## 2022-05-15 NOTE — Assessment & Plan Note (Signed)
She is doing well, abscess drained and no new concerns.  No indication for any further imaging at this time.  No issues with her eyes.   Discussed the findings with her and reviewed the history.  No further treatment indicated and she can rtc as needed.  I have personally spent 30 minutes involved in face-to-face and non-face-to-face activities for this patient on the day of the visit. Professional time spent includes the following activities: Preparing to see the patient (review of tests), Obtaining and/or reviewing separately obtained history (admission/discharge record), Performing a medically appropriate examination and/or evaluation , Ordering medications/tests/procedures, referring and communicating with other health care professionals, Documenting clinical information in the EMR, Independently interpreting results (not separately reported), Communicating results to the patient/family/caregiver, Counseling and educating the patient/family/caregiver and Care coordination (not separately reported).

## 2022-05-16 ENCOUNTER — Ambulatory Visit: Payer: 59 | Attending: Critical Care Medicine | Admitting: Pharmacist

## 2022-05-16 DIAGNOSIS — I82501 Chronic embolism and thrombosis of unspecified deep veins of right lower extremity: Secondary | ICD-10-CM | POA: Diagnosis not present

## 2022-05-16 LAB — POCT INR: INR: 4.5 — AB (ref 2.0–3.0)

## 2022-05-21 ENCOUNTER — Ambulatory Visit: Payer: 59 | Attending: Critical Care Medicine | Admitting: Pharmacist

## 2022-05-21 DIAGNOSIS — I82501 Chronic embolism and thrombosis of unspecified deep veins of right lower extremity: Secondary | ICD-10-CM | POA: Diagnosis not present

## 2022-05-21 LAB — POCT INR: INR: 1.7 — AB (ref 2.0–3.0)

## 2022-05-28 ENCOUNTER — Other Ambulatory Visit: Payer: Self-pay | Admitting: Critical Care Medicine

## 2022-05-28 NOTE — Telephone Encounter (Signed)
Requested medication (s) are due for refill today - unsure  Requested medication (s) are on the active medication list -yes  Future visit scheduled -yes  Last refill: 05/06/22 #30  Notes to clinic: No RF given on Rx- not sure long term Rx - sent for review   Requested Prescriptions  Pending Prescriptions Disp Refills   [facility] VITAMIN B12 1000 MCG tablet [Pharmacy Med Name: [facility] B-12 1,000 MCG TABLET] 30 tablet 0    Sig: TAKE 1 TABLET BY MOUTH EVERY DAY     Endocrinology:  Vitamins - Vitamin B12 Passed - 05/28/2022 11:30 AM      Passed - HCT in normal range and within 360 days    Hematocrit  Date Value Ref Range Status  05/06/2022 36.1 34.0 - 46.6 % Final         Passed - HGB in normal range and within 360 days    Hemoglobin  Date Value Ref Range Status  05/06/2022 11.6 11.1 - 15.9 g/dL Final         Passed - B12 Level in normal range and within 360 days    Vitamin B-12  Date Value Ref Range Status  04/18/2022 180 180 - 914 pg/mL Final    Comment:    (NOTE) This assay is not validated for testing neonatal or myeloproliferative syndrome specimens for Vitamin B12 levels. Performed at [facility], [address] [place], [place] [address]          Passed - Valid encounter within last 12 months    Recent Outpatient Visits           3 weeks ago Candidemia [facility])   [facility] [facility] Storm Frisk, MD   2 years ago [facility]pulmonary [facility])   [facility] Storm Frisk, MD   3 years ago Acute deep vein thrombosis (DVT) of femoral vein of right lower extremity [facility])   [place] [facility] Storm Frisk, MD   3 years ago PE (pulmonary thromboembolism) [facility])   [place] [facility] Storm Frisk, MD       Future Appointments             In 2 days Drucilla Chalet, RPH-CPP [place] [facility]   In 2 weeks  Storm Frisk, MD [facility]                Requested Prescriptions  Pending Prescriptions Disp Refills   [facility]S VITAMIN B12 1000 MCG tablet [Pharmacy Med Name: [facility]S B-12 1,000 MCG TABLET] 30 tablet 0    Sig: TAKE 1 TABLET BY MOUTH EVERY DAY     Endocrinology:  Vitamins - Vitamin B12 Passed - 05/28/2022 11:30 AM      Passed - HCT in normal range and within 360 days    Hematocrit  Date Value Ref Range Status  05/06/2022 36.1 34.0 - 46.6 % Final         Passed - HGB in normal range and within 360 days    Hemoglobin  Date Value Ref Range Status  05/06/2022 11.6 11.1 - 15.9 g/dL Final         Passed - B12 Level in normal range and within 360 days    Vitamin B-12  Date Value Ref Range Status  04/18/2022 180 180 - 914 pg/mL Final    Comment:    (  NOTE) This assay is not validated for testing neonatal or myeloproliferative syndrome specimens for Vitamin B12 levels. Performed at [facility], [address], [place], [place] [address]          Passed - Valid encounter within last 12 months    Recent Outpatient Visits           3 weeks ago Candidemia [facility])   [place] [facility] Storm Frisk, MD   2 years ago PE (pulmonary thromboembolism) [facility])   [facility] Storm Frisk, MD   3 years ago Acute deep vein thrombosis (DVT) of femoral vein of right lower extremity [facility])   [place] [facility] Storm Frisk, MD   3 years ago PE (pulmonary thromboembolism) [facility])   [place] [facility] Storm Frisk, MD       Future Appointments             In 2 days Lois Huxley, Cornelius Moras, RPH-CPP [facility]   In 2 weeks Delford Field Charlcie Cradle, MD [facility]

## 2022-05-28 NOTE — Telephone Encounter (Signed)
Requested medication (s) are due for refill today: No  Requested medication (s) are on the active medication list: Yes  Last refill:  05/06/22  Future visit scheduled: Yes  Notes to clinic:  Pharmacy requests 90 day supply.    Requested Prescriptions  Pending Prescriptions Disp Refills   sertraline (ZOLOFT) 50 MG tablet [Pharmacy Med Name: SERTRALINE HCL 50 MG TABLET] 90 tablet 2    Sig: TAKE 1 TABLET BY MOUTH EVERY DAY     Psychiatry:  Antidepressants - SSRI - sertraline Passed - 05/28/2022 11:34 AM      Passed - AST in normal range and within 360 days    AST  Date Value Ref Range Status  05/06/2022 18 0 - 40 IU/L Final         Passed - ALT in normal range and within 360 days    ALT  Date Value Ref Range Status  05/06/2022 14 0 - 32 IU/L Final         Passed - Completed PHQ-2 or PHQ-9 in the last 360 days      Passed - Valid encounter within last 6 months    Recent Outpatient Visits           3 weeks ago Candidemia [facility])   [place] [facility] Storm Frisk, MD   2 years ago PE (pulmonary thromboembolism) [facility])   [place] [facility] Storm Frisk, MD   3 years ago Acute deep vein thrombosis (DVT) of femoral vein of right lower extremity [facility])   [place] [facility] Storm Frisk, MD   3 years ago PE (pulmonary thromboembolism) [facility])    [facility] Storm Frisk, MD       Future Appointments             In 2 days Lois Huxley, Cornelius Moras, RPH-CPP [facility]   In 2 weeks Delford Field Charlcie Cradle, MD [facility]

## 2022-05-30 ENCOUNTER — Ambulatory Visit: Payer: 59 | Admitting: Pharmacist

## 2022-06-16 NOTE — Progress Notes (Signed)
Established Patient Office Visit  Subjective   Patient ID: Susan Warren, female    DOB: 09-26-1976  Age: 46 y.o. MRN: [id number]  Chief Complaint  Patient presents with   Hypertension    05/06/22 This is a transition of care post hospital visit.  Note there was already a transition of care visit with the RN on May 9 This patient has not been to see me as a primary care provider since May 2020.  In the interim the patient had 2 admissions in April for right lower abdominal pain the second admission is documented as below.  The patient had a drainage procedure performed of the right lower abscessed cystic mass.  This showed Candida infection and blood cultures were positive as well.  Note she had a previous complex cyst below the right lobe of the liver previously seen in 2018 on a renal CT scan study.  The cystic mass is larger now.  Etiology of this is never been discerned.  The patient was given prolonged course of intravenous antifungals and also broad-spectrum anaerobic coverage.  She was consulted by infectious disease.  She was then switched to oral Augmentin and also Diflucan.  She is finished her Augmentin over the weekend and has 1 dose of 800 mg Diflucan left.  She had significant side effects from the Diflucan with shakiness and tremor.  On arrival blood pressure was 128/95.  She states her abdominal pain is improved.  Blood pressure is being controlled with amlodipine.  She needs refills on same.  She does note some anxiety and would like a behavioral health referral.  INR today was 7.4.  Note it was 1.7 on discharge and then on Thursday of last week up to 3.7 likely due to the effects of ongoing use of Augmentin.  Now the Augmentin is off.  Patient seen conjointly with our clinical pharmacist [facility].  See separate documentation from clinical pharmacy for warfarin management.  Note patient is due a Pap smear and also a colonoscopy.  Note the patient has been to see ophthalmology  retinal exam was unremarkable for Candida involvement of the retina this occurred post discharge  Below is a copy of the discharge summary  Admit date: 04/15/2022 Discharge date: 04/29/2022 Recommendations for Outpatient Follow-up:  1. Follow up with PCP in 1 weeks-call for appointment 2. Please obtain BMP/CBC in one week   Discharge Dispo: home health Discharge Condition: Stable   Brief/Interim Summary: 46 year old F with PMH of DVT/PE on warfarin, morbid obesity, lymphedema, anxiety and depression presenting with nausea, vomiting and abdominal pain, and admitted for abdominal pain.  CT abdomen and pelvis showed interval progression of the large cystic mass in the right abdomen and pelvis measuring about 24 x 24 x 24 cm and hepatic cirrhosis but no acute finding.  She had leukocytosis to 17.6.  INR was supratherapeutic.  She was started on IV Zosyn.  IR consulted and planning drainage once INR allows. Blood culture with Candida parapsilosis in 1 out of 2 bottles.  ID consulted and started micafungin in addition to IV Zosyn.  TTE without significant finding of vegetation.  ID recommending TEE.  Repeat blood culture on 4/28 NGTD. INR reversed with FFP and vitamin K.  She had US guided aspiration of large abdominal cystic mass that yielded 7.2 L brown debris-filled cystic fluid on 5/1.TEE on 5/2 without vegetation. Fluid gram stain negative culture no growth so far cytology/pathology- no malignant cells but numerous acute inflammatory cells and necrotic debris  compatible with abscess. Marland KitchenRestarted warfarin with Lovenox bridge and awaiting INR to be therapeutic-at this time patient feels comfortable administering Lovenox which she has been doing since Friday, PCP has agreed to see her this week to monitor INR, advised to monitor at least every 48 hours/s, the Lovenox-patient was explained in detail the risk of being on Lovenox if INR is above therapeutic/supratherapeutic-so INR needs to be followed very  closely discussed with pharmacy we will dose her with Coumadin 5 mg daily moving forward and PCP will adjust her dose further I talked with ophthalmology Dr. Dione Booze who will arrange for follow-up for ophthalmology evaluation  04/30/22- he was provided with patient's details and phone number.    Discharge Diagnoses:  Principal Problem:   Abdominal pain due to right abdominal and pelvic cystic mass Active Problems:   Candidemia (HCC)   Supratherapeutic INR   History of pulmonary embolism   Right leg DVT (HCC)   Morbid obesity with body mass index of 70 and over in adult [facility])   Normocytic anemia   Leukocytosis   Hypertension   GERD (gastroesophageal reflux disease)   Lymphedema of both lower extremities   Right abdominal and pelvic cystic mass   Hyponatremia   Vitamin B12 deficiency   Herpes simplex labialis   Hypomagnesemia   Right-sided abdominal wound pelvic cystic mass/abscess: S/p ultrasound-guided aspiration by IR and fluid analysis so far negative for malignant cells.  Only showed acute inflammatory cells and necrotic debris is compatible with abscess.  Blood cultures positive for Candida initially, repeat blood cultures on 4/28 negative.  Afebrile, WBC normalized.  Augmentin/diclofenac to complete 14-day course per ID.  Called in prescription to [facility] outpatient pharmacy but apparently closed on Sunday.  She will complete the course through 05/06/22.  Requesting hydrocodone we will give short supplies to manage severe pain and advised to minimize the use of narcotics due to risk of tolerance/abuse potential, should use nonnarcotic regimen and follow-up with PCP   Candidemia: Seen by ID.  TEE negative for vegetations.  Initially on IV micafungin->converted to p.o. Diflucan 800 mg for 14-day course.  She will follow-up with ophthalmology Dr. Dione Booze 04/30/22   History of DVT/PE and coagulopathy on admission: Patient on Coumadin at baseline.  Initially on presentation, patient with  supratherapeutic INR at 5.9.  In anticipation of procedures, vitamin K 5 mg and FFP x2 given.  INR now subtherapeutic and patient receiving bridging Lovenox. Since most recent CTA and ultrasonogram lower extremity negative for any acute thrombosis few weeks back, probably reasonable to send patient home with self-administered bridging Lovenox/Coumadin - PCP has agreed to see her this week to monitor INR, advised to monitor at least every 48 hours/s, the Lovenox-patient was explained in detail the risk of being on Lovenox if INR is above therapeutic/supratherapeutic-so INR needs to be followed very closely discussed with pharmacy we will dose her with Coumadin 5 mg daily moving forward and PCP will adjust her dose furthe Last Labs  Recent Labs Lab 04/25/22 0454 04/26/22 0335 04/27/22 0336 04/28/22 0333 04/29/22 0326 INR 1.2 1.2 1.3* 1.3* 1.3*    Hyponatremia: Now resolved with IV hydration. GERD PPI Hypertension: cont on home meds Morbid obesity:Patient's Body mass index is 82.39 kg/m. : Will benefit with PCP follow-up, weight loss healthy lifestyle and outpatient sleep evaluation TOC consulted to ensure patient has a appropriate follow-up and also Lovenox for home Patient endorsed she reached out to PCP and has a follow-up for Thursday and for next week  week and her doctor will monitor INR   Consults:  ID, ophthalmology, wound care  6/26 Since the last visit patient is doing well infectious diseases signed off no further antibiotics needed for fungal abscess.  Patient also had a clear eye exam.  She is yet to receive her Cologuard kit.  She does need a Pap smear.  On arrival blood pressure is 138/83.  INR was 6.9 will need warfarin dosing adjustment for DVT and pulmonary embolism.  There are no other complaints.    Past Medical History:  Diagnosis Date   Abdominal pain due to right abdominal and pelvic cystic mass 04/16/2022   Allergy    Anxiety    Candidemia (HCC) 04/18/2022    Depression    DVT (deep venous thrombosis) (HCC)    GERD (gastroesophageal reflux disease)    Hepatic cyst    Hypertension    Lymphedema of both lower extremities    Migraines    Morbid obesity with BMI of 70 and over, adult [facility])    PE (pulmonary thromboembolism) (HCC) 03/2019   Pneumonia 04/09/2022   Sleep apnea      Family History  Problem Relation Age of Onset   Hypertension Mother    Thyroid disease Mother    Hypertension Maternal Grandmother    Diabetes Maternal Grandmother    Breast cancer Maternal Grandmother    Stroke Maternal Grandmother    Heart attack Neg Hx      Social History   Socioeconomic History   Marital status: Widowed    Spouse name: Not on file   Number of children: Not on file   Years of education: 12   Highest education level: Not on file  Occupational History   Occupation: Admin.    Employer: [employer]  Tobacco Use   Smoking status: Never   Smokeless tobacco: Never  Vaping Use   Vaping Use: Never used  Substance and Sexual Activity   Alcohol use: Yes    Comment: occ   Drug use: No   Sexual activity: Not Currently    Birth control/protection: I.U.D.  Other Topics Concern   Not on file  Social History Narrative   Married, 1 child   Social Determinants of Health   Financial Resource Strain: Not on file  Food Insecurity: Not on file  Transportation Needs: Not on file  Physical Activity: Not on file  Stress: Not on file  Social Connections: Not on file  Intimate Partner Violence: Not on file     Allergies  Allergen Reactions   Adhesive [Tape] Anaphylaxis and Swelling    *Adhesive Spray*      Outpatient Medications Prior to Visit  Medication Sig Dispense Refill   acetaminophen (TYLENOL) 325 MG tablet Take 2 tablets (650 mg total) by mouth every 6 (six) hours as needed for mild pain (or Fever >/= 101).     amLODipine (NORVASC) 10 MG tablet Take 1 tablet (10 mg total) by mouth daily. 90 tablet 2   cyanocobalamin ([facility]S  VITAMIN B12) 1000 MCG tablet TAKE 1 TABLET BY MOUTH EVERY DAY 90 tablet 0   sertraline (ZOLOFT) 50 MG tablet TAKE 1 TABLET BY MOUTH EVERY DAY 90 tablet 1   warfarin (COUMADIN) 5 MG tablet Take 5 mg daily and adjust dose based on INR per PCP, follow-up INR every 48 hours with PCP 1 tablet 1   No facility-administered medications prior to visit.     Review of Systems  Constitutional:  Negative for chills, diaphoresis, fever,  malaise/fatigue and weight loss.  HENT:  Negative for congestion, hearing loss, nosebleeds, sore throat and tinnitus.   Eyes:  Negative for blurred vision, photophobia and redness.  Respiratory:  Negative for cough, hemoptysis, sputum production, shortness of breath, wheezing and stridor.   Cardiovascular:  Negative for chest pain, palpitations, orthopnea, claudication, leg swelling and PND.  Gastrointestinal:  Negative for abdominal pain, blood in stool, constipation, diarrhea, heartburn, nausea and vomiting.  Genitourinary:  Negative for dysuria, flank pain, frequency, hematuria and urgency.  Musculoskeletal:  Negative for back pain, falls, joint pain, myalgias and neck pain.  Skin:  Negative for itching and rash.  Neurological:  Negative for dizziness, tingling, tremors, sensory change, speech change, focal weakness, seizures, loss of consciousness, weakness and headaches.  Endo/Heme/Allergies:  Negative for environmental allergies and polydipsia. Does not bruise/bleed easily.  Psychiatric/Behavioral:  Negative for depression, memory loss, substance abuse and suicidal ideas. The patient is not nervous/anxious and does not have insomnia.       Objective:     BP 138/83   Pulse (!) 107   Wt (!) 491 lb 9.6 oz (223 kg)   SpO2 93%   BMI 84.38 kg/m    Physical Exam Vitals reviewed.  Constitutional:      Appearance: Normal appearance. She is well-developed. She is obese. She is not diaphoretic.  HENT:     Head: Normocephalic and atraumatic.     Nose: No nasal  deformity, septal deviation, mucosal edema or rhinorrhea.     Right Sinus: No maxillary sinus tenderness or frontal sinus tenderness.     Left Sinus: No maxillary sinus tenderness or frontal sinus tenderness.     Mouth/Throat:     Pharynx: No oropharyngeal exudate.  Eyes:     General: No scleral icterus.    Conjunctiva/sclera: Conjunctivae normal.     Pupils: Pupils are equal, round, and reactive to light.  Neck:     Thyroid: No thyromegaly.     Vascular: No carotid bruit or JVD.     Trachea: Trachea normal. No tracheal tenderness or tracheal deviation.  Cardiovascular:     Rate and Rhythm: Normal rate and regular rhythm.     Chest Wall: PMI is not displaced.     Pulses: Normal pulses. No decreased pulses.     Heart sounds: Normal heart sounds, S1 normal and S2 normal. Heart sounds not distant. No murmur heard.    No systolic murmur is present.     No diastolic murmur is present.     No friction rub. No gallop. No S3 or S4 sounds.  Pulmonary:     Effort: No tachypnea, accessory muscle usage or respiratory distress.     Breath sounds: No stridor. No decreased breath sounds, wheezing, rhonchi or rales.  Chest:     Chest wall: No tenderness.  Abdominal:     General: Bowel sounds are normal. There is no distension.     Palpations: Abdomen is soft. Abdomen is not rigid.     Tenderness: There is no abdominal tenderness. There is no guarding or rebound.  Musculoskeletal:        General: Normal range of motion.     Cervical back: Normal range of motion and neck supple. No edema, erythema or rigidity. No muscular tenderness. Normal range of motion.  Lymphadenopathy:     Head:     Right side of head: No submental or submandibular adenopathy.     Left side of head: No submental or submandibular adenopathy.  Cervical: No cervical adenopathy.  Skin:    General: Skin is warm and dry.     Coloration: Skin is not pale.     Findings: No rash.     Nails: There is no clubbing.   Neurological:     Mental Status: She is alert and oriented to person, place, and time.     Sensory: No sensory deficit.  Psychiatric:        Mood and Affect: Mood normal.        Speech: Speech normal.        Behavior: Behavior normal.        Thought Content: Thought content normal.      Results for orders placed or performed in visit on 06/17/22  INR  Result Value Ref Range   INR 6.9 (A) 2.0 - 3.0      The 10-year ASCVD risk score (Arnett DK, et al., 2019) is: 2.2%    Assessment & Plan:   Problem List Items Addressed This Visit       Cardiovascular and Mediastinum   Hypertension    Blood pressure at goal no changes made      Leg DVT (deep venous thromboembolism), chronic, left (HCC) - Primary    Patient needs to remain on warfarin for life        Other   Supratherapeutic INR    Management per warfarin clinic      Colon cancer screening    We will reissue Cologuard kit      Cervical cancer screening    Due to morbid obesity will need to refer to gynecology for Pap smear since her BMI is 85      Relevant Orders   Ambulatory referral to Gynecology   RESOLVED: Candidemia (HCC)    Fungal candidemia has resolved no further antibiotics      Other Visit Diagnoses     Chronic deep vein thrombosis (DVT) of femoral vein of right lower extremity (HCC)       Relevant Orders   INR (Completed)      Return in about 4 months (around 10/17/2022).    Shan Levans, MD

## 2022-06-17 ENCOUNTER — Encounter: Payer: Self-pay | Admitting: Critical Care Medicine

## 2022-06-17 ENCOUNTER — Ambulatory Visit: Payer: 59 | Attending: Critical Care Medicine | Admitting: Critical Care Medicine

## 2022-06-17 VITALS — BP 138/83 | HR 107 | Wt >= 6400 oz

## 2022-06-17 DIAGNOSIS — I82511 Chronic embolism and thrombosis of right femoral vein: Secondary | ICD-10-CM

## 2022-06-17 DIAGNOSIS — B377 Candidal sepsis: Secondary | ICD-10-CM

## 2022-06-17 DIAGNOSIS — Z124 Encounter for screening for malignant neoplasm of cervix: Secondary | ICD-10-CM

## 2022-06-17 DIAGNOSIS — I10 Essential (primary) hypertension: Secondary | ICD-10-CM

## 2022-06-17 DIAGNOSIS — I82501 Chronic embolism and thrombosis of unspecified deep veins of right lower extremity: Secondary | ICD-10-CM

## 2022-06-17 DIAGNOSIS — Z1211 Encounter for screening for malignant neoplasm of colon: Secondary | ICD-10-CM

## 2022-06-17 DIAGNOSIS — R791 Abnormal coagulation profile: Secondary | ICD-10-CM

## 2022-06-17 LAB — POCT INR: INR: 6.9 — AB (ref 2.0–3.0)

## 2022-06-17 NOTE — Assessment & Plan Note (Signed)
Fungal candidemia has resolved no further antibiotics

## 2022-06-17 NOTE — Assessment & Plan Note (Signed)
Due to morbid obesity will need to refer to gynecology for Pap smear since her BMI is 85

## 2022-07-04 ENCOUNTER — Ambulatory Visit: Payer: 59 | Attending: Critical Care Medicine | Admitting: Pharmacist

## 2022-07-04 DIAGNOSIS — I82501 Chronic embolism and thrombosis of unspecified deep veins of right lower extremity: Secondary | ICD-10-CM | POA: Diagnosis not present

## 2022-07-04 LAB — POCT INR: INR: 4.2 — AB (ref 2.0–3.0)

## 2022-07-10 ENCOUNTER — Encounter: Payer: Self-pay | Admitting: Critical Care Medicine

## 2022-07-18 ENCOUNTER — Ambulatory Visit: Payer: 59 | Admitting: Pharmacist

## 2022-07-25 ENCOUNTER — Ambulatory Visit: Payer: 59 | Attending: Critical Care Medicine | Admitting: Pharmacist

## 2022-07-25 DIAGNOSIS — I82501 Chronic embolism and thrombosis of unspecified deep veins of right lower extremity: Secondary | ICD-10-CM | POA: Diagnosis not present

## 2022-07-25 LAB — POCT INR: INR: 2.8 (ref 2.0–3.0)

## 2022-08-31 ENCOUNTER — Other Ambulatory Visit: Payer: Self-pay | Admitting: Critical Care Medicine

## 2022-09-05 ENCOUNTER — Ambulatory Visit: Payer: 59 | Admitting: Pharmacist

## 2022-10-10 ENCOUNTER — Ambulatory Visit: Payer: 59 | Attending: Critical Care Medicine | Admitting: Pharmacist

## 2022-10-10 ENCOUNTER — Other Ambulatory Visit: Payer: Self-pay | Admitting: Pharmacist

## 2022-10-10 DIAGNOSIS — I82411 Acute embolism and thrombosis of right femoral vein: Secondary | ICD-10-CM

## 2022-10-10 DIAGNOSIS — I82501 Chronic embolism and thrombosis of unspecified deep veins of right lower extremity: Secondary | ICD-10-CM | POA: Diagnosis not present

## 2022-10-10 LAB — PROTIME-INR: INR: 2.9 — AB (ref 0.80–1.20)

## 2022-10-10 MED ORDER — WARFARIN SODIUM 5 MG PO TABS: ORAL_TABLET | ORAL | 1 refills | Status: DC

## 2022-10-24 ENCOUNTER — Ambulatory Visit: Payer: 59 | Admitting: Critical Care Medicine

## 2022-10-24 ENCOUNTER — Ambulatory Visit: Payer: 59 | Admitting: Physician Assistant

## 2022-10-24 ENCOUNTER — Ambulatory Visit: Payer: 59 | Admitting: Pharmacist

## 2022-11-28 ENCOUNTER — Ambulatory Visit: Payer: 59 | Admitting: Pharmacist

## 2022-12-27 ENCOUNTER — Ambulatory Visit: Payer: 59 | Admitting: Pharmacist

## 2023-01-02 ENCOUNTER — Ambulatory Visit: Payer: 59 | Admitting: Critical Care Medicine

## 2023-01-08 ENCOUNTER — Ambulatory Visit: Payer: 59 | Admitting: Physician Assistant

## 2023-01-28 ENCOUNTER — Ambulatory Visit: Payer: 59 | Attending: Critical Care Medicine | Admitting: Pharmacist

## 2023-01-28 ENCOUNTER — Other Ambulatory Visit: Payer: Self-pay | Admitting: Pharmacist

## 2023-01-28 DIAGNOSIS — I82501 Chronic embolism and thrombosis of unspecified deep veins of right lower extremity: Secondary | ICD-10-CM

## 2023-01-28 DIAGNOSIS — I82411 Acute embolism and thrombosis of right femoral vein: Secondary | ICD-10-CM

## 2023-01-28 LAB — POCT INR: POC INR: 2.1

## 2023-01-28 MED ORDER — SERTRALINE HCL 50 MG PO TABS: 50.0000 mg | ORAL_TABLET | Freq: Every day | ORAL | 1 refills | Status: DC

## 2023-01-28 MED ORDER — AMLODIPINE BESYLATE 10 MG PO TABS: 10.0000 mg | ORAL_TABLET | Freq: Every day | ORAL | 2 refills | Status: DC

## 2023-01-28 MED ORDER — WARFARIN SODIUM 5 MG PO TABS: ORAL_TABLET | ORAL | 1 refills | Status: DC

## 2023-01-28 MED ORDER — CYANOCOBALAMIN 1000 MCG PO TABS: 1000.0000 ug | ORAL_TABLET | Freq: Every day | ORAL | 0 refills | Status: AC

## 2023-02-27 ENCOUNTER — Ambulatory Visit: Payer: 59 | Admitting: Pharmacist

## 2023-03-25 ENCOUNTER — Ambulatory Visit: Payer: 59 | Attending: Critical Care Medicine | Admitting: Pharmacist

## 2023-03-25 DIAGNOSIS — I82501 Chronic embolism and thrombosis of unspecified deep veins of right lower extremity: Secondary | ICD-10-CM | POA: Diagnosis not present

## 2023-03-25 LAB — POCT INR: INR: 1.8 — AB (ref 2.0–3.0)

## 2023-04-24 ENCOUNTER — Ambulatory Visit: Payer: 59 | Admitting: Pharmacist

## 2023-04-26 IMAGING — CR DG ABDOMEN 1V
4 series · 4 of 4 positions shown · non-contrast
Comparison: None.

CLINICAL DATA: Right upper quadrant pain, kidney stone, nausea.

EXAM:
ABDOMEN - 1 VIEW

[t abdomen supine (1 of 4)]
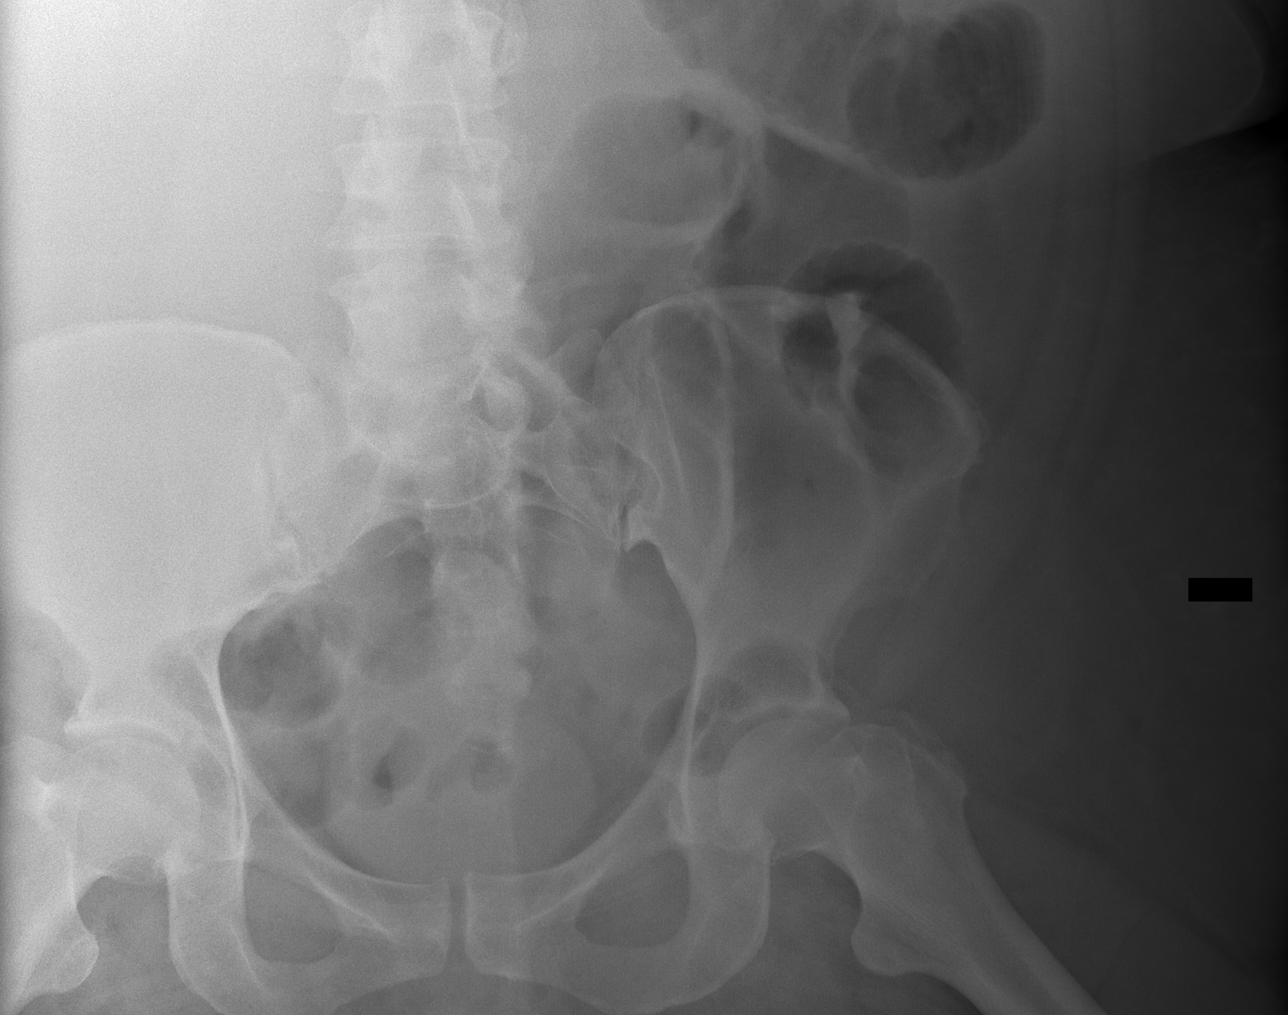

[t abdomen supine (2 of 4)]
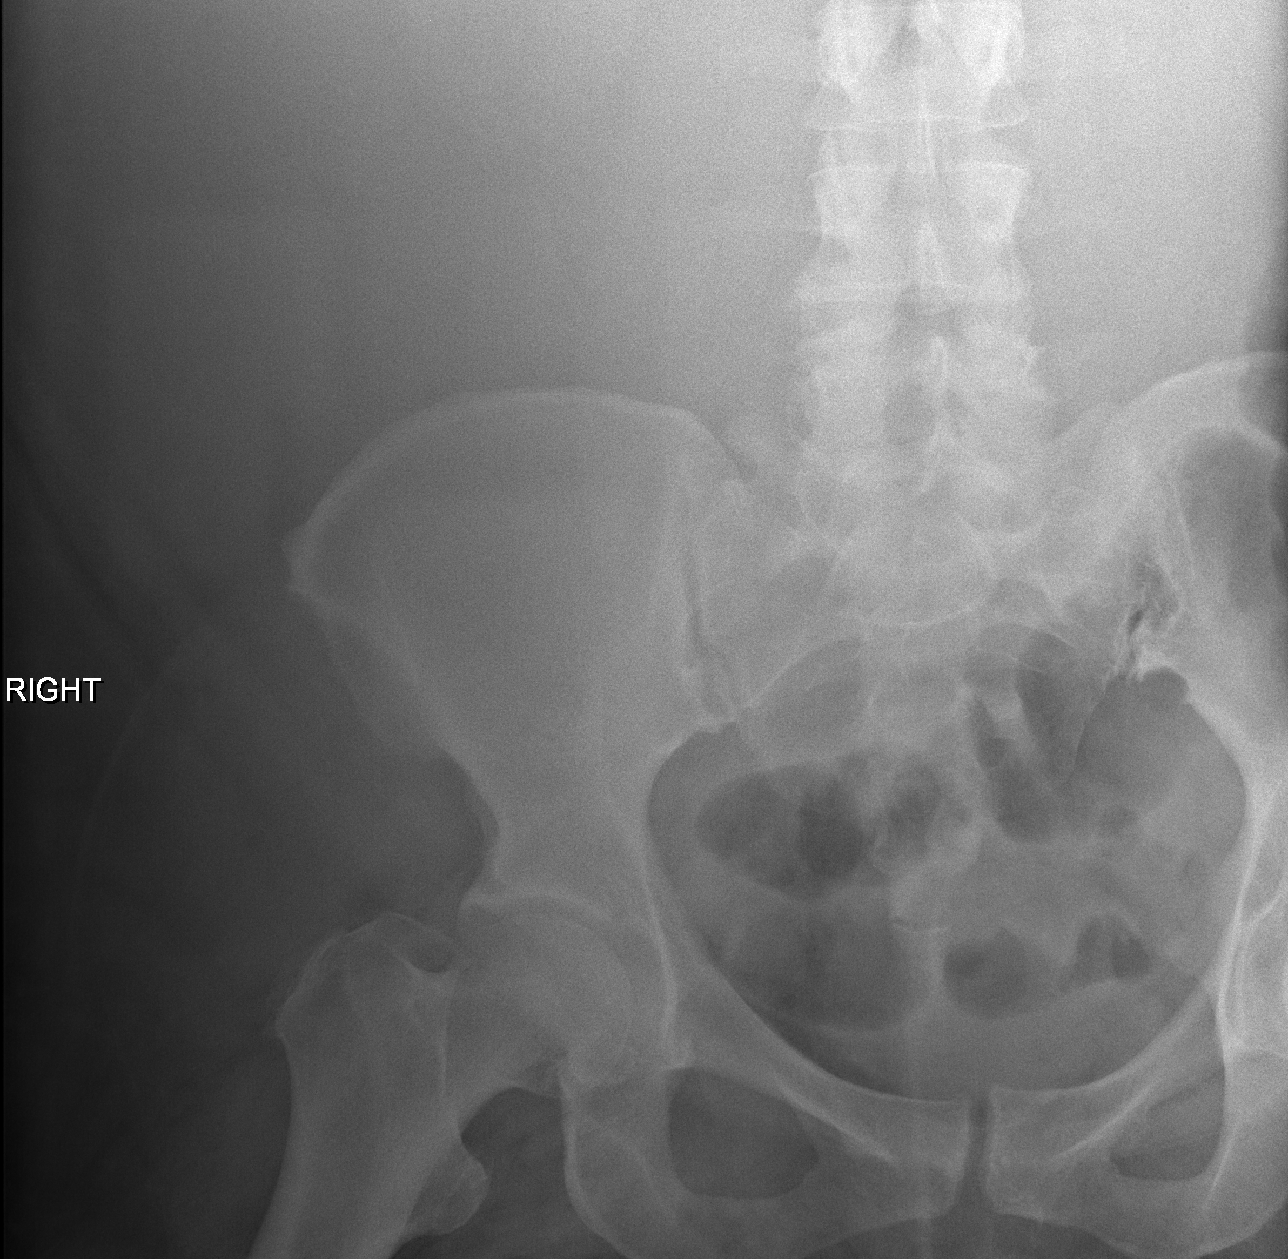

[t abdomen supine (3 of 4)]
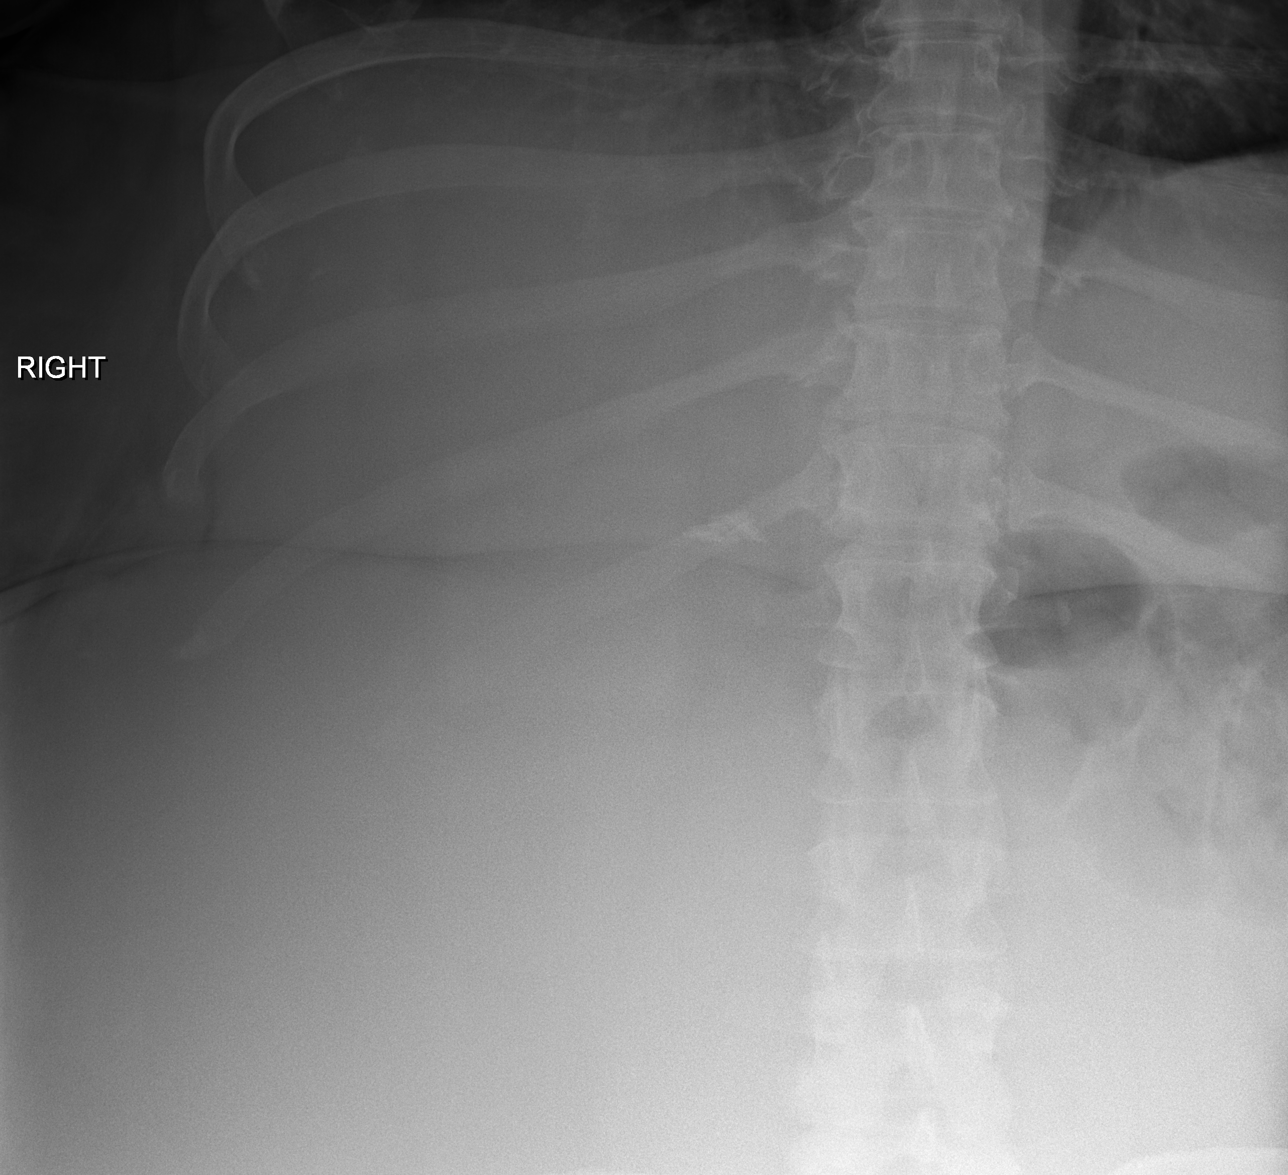

[t abdomen supine (4 of 4)]
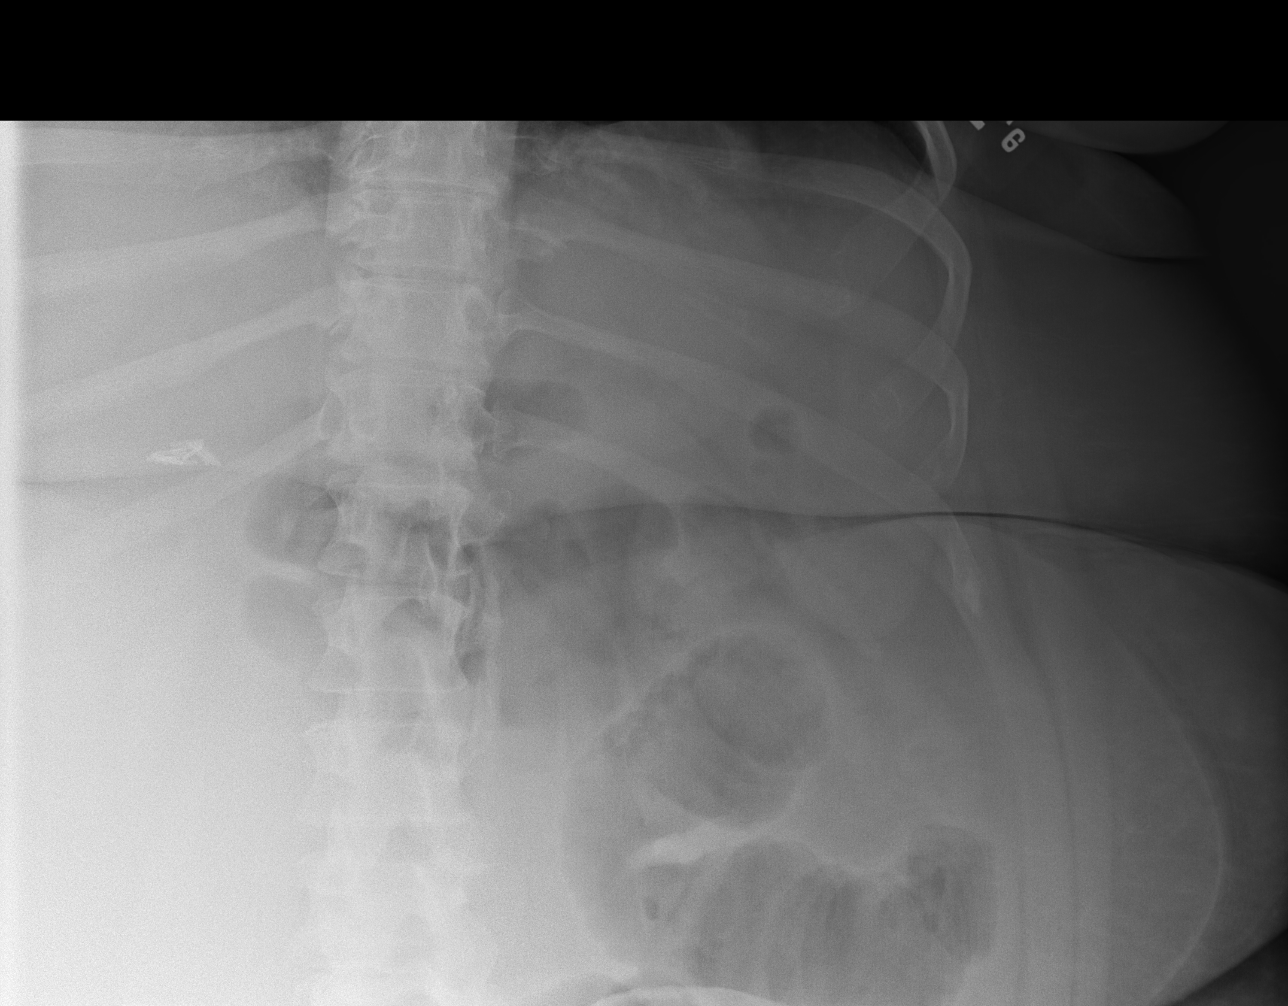

[4 of 4 positions shown; findings below may reference images not displayed]

FINDINGS: A distended loop of small bowel is noted in the mid left abdomen
measuring up to 5.4 cm in diameter. There is paucity of bowel gas in
the right abdomen. No renal calculus is seen bilaterally. Surgical
clips are present in the right upper quadrant.
IMPRESSION: 1. No evidence of renal calculus.
2. Dilated loops of small bowel in the abdomen measuring up to
cm, concerning for small bowel obstruction. CT is recommended for
further evaluation.
3. Paucity of bowel gas in the right abdomen.

## 2023-04-26 IMAGING — CT CT ABD-PELV W/ CM
2 of 5 series · 15 of 46 positions shown, 17 images · IV contrast (APPLIED)
Comparison: CT venogram abdomen/pelvis 04/12/2019

CLINICAL DATA: Abdominal pain with nausea vomiting and diarrhea.

EXAM:
CT ABDOMEN AND PELVIS WITH CONTRAST
TECHNIQUE: Multidetector CT imaging of the abdomen and pelvis was performed
using the standard protocol following bolus administration of
intravenous contrast.

[Series 2: abd/ pelvis 5.0 i30f 2 · axial · 0.98mm/px · z∈[+767,+1217]mm · 12 of 101 slices shown, 14 images]
[im 6/101  soft-tissue]
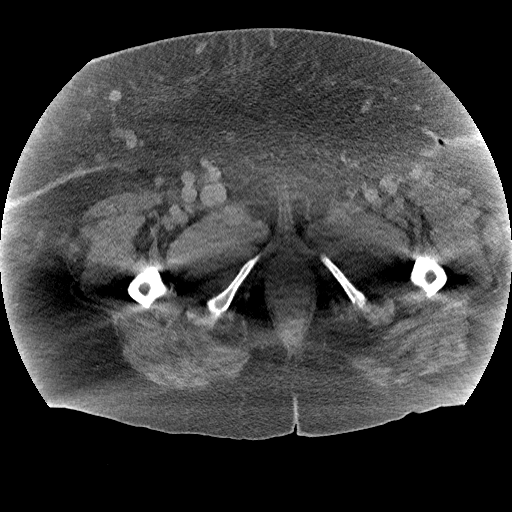
[im 6/101  bone]
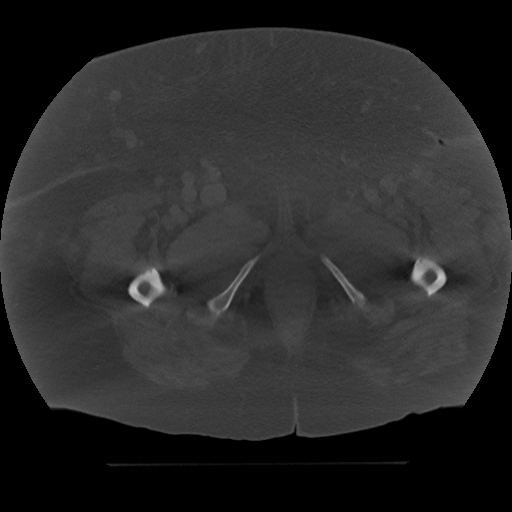
[im 16/101  soft-tissue]
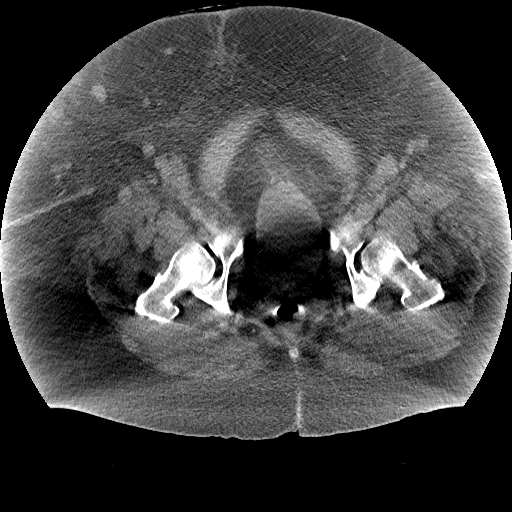
[im 21/101  soft-tissue]
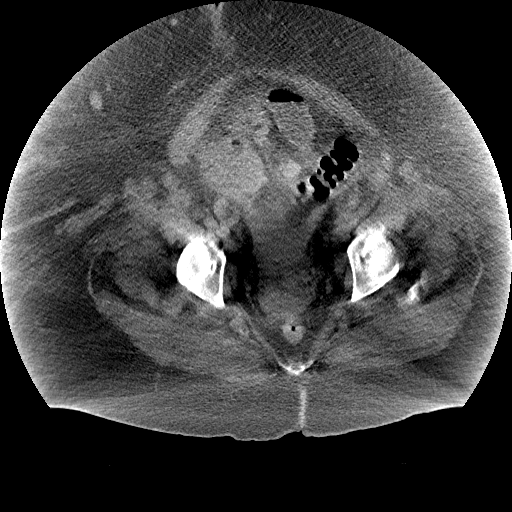
[im 31/101  soft-tissue]
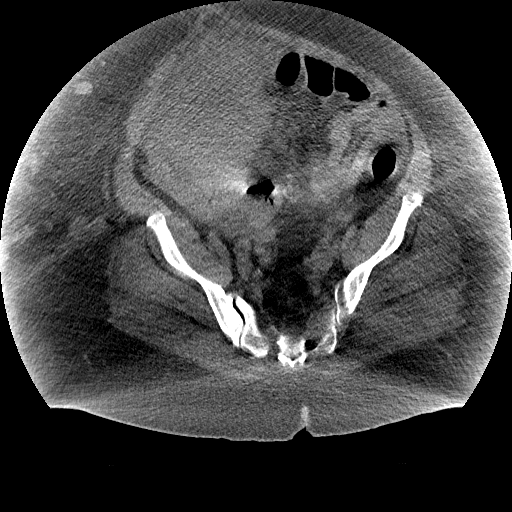
[im 41/101  soft-tissue]
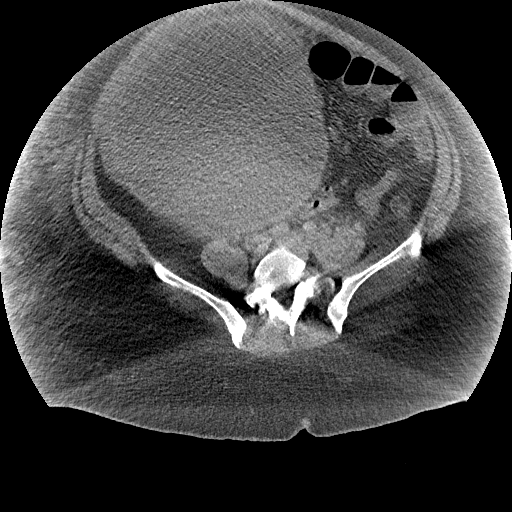
[im 46/101  soft-tissue]
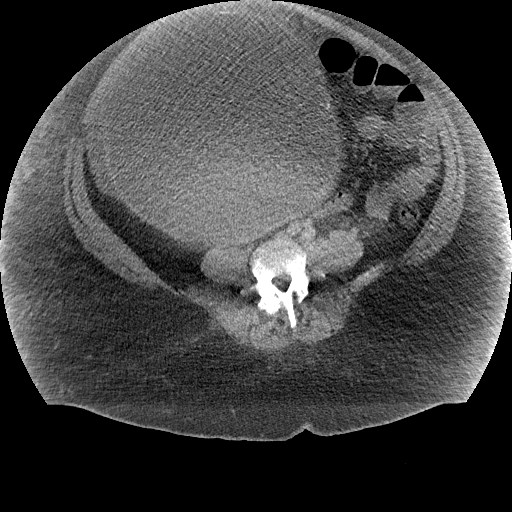
[im 56/101  soft-tissue]
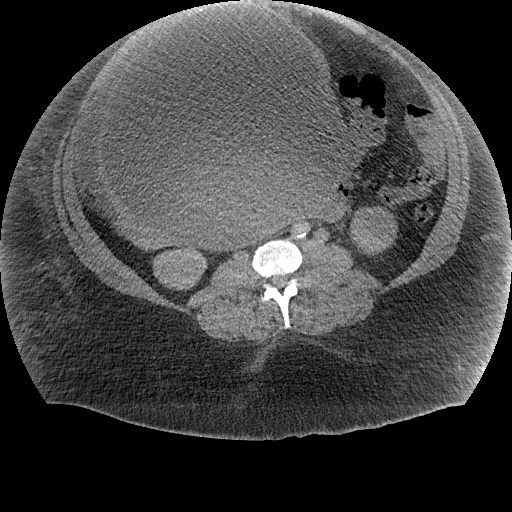
[im 61/101  soft-tissue]
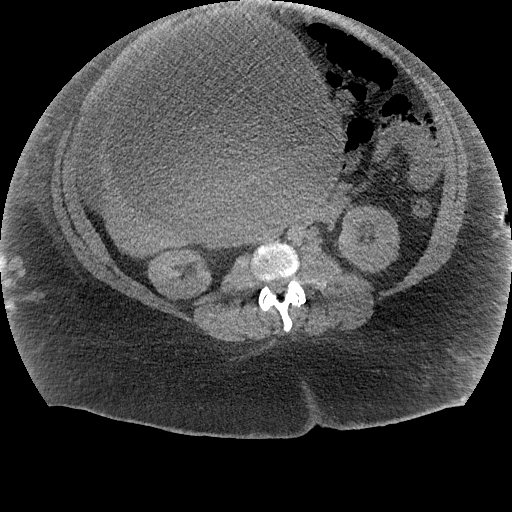
[im 71/101  soft-tissue]
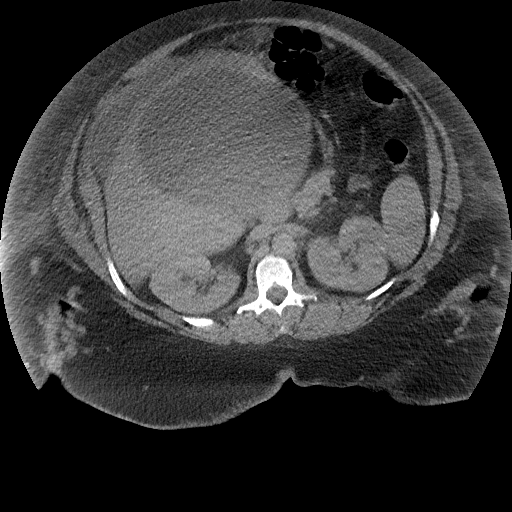
[im 71/101  bone]
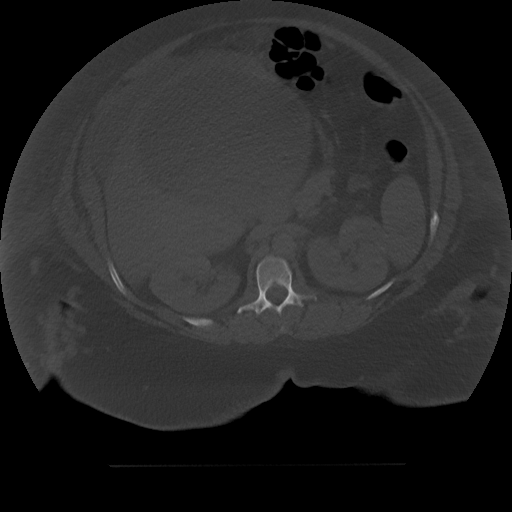
[im 81/101  soft-tissue]
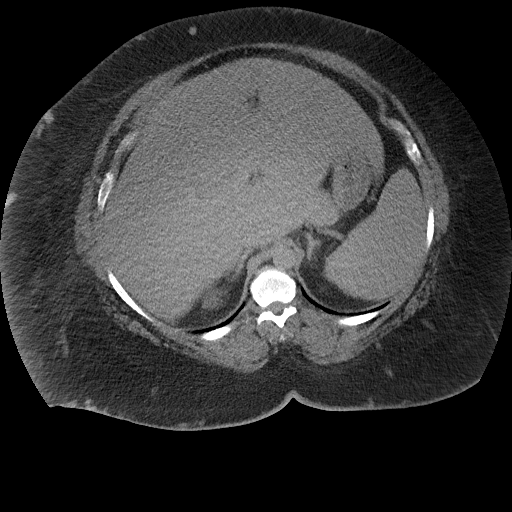
[im 86/101  soft-tissue]
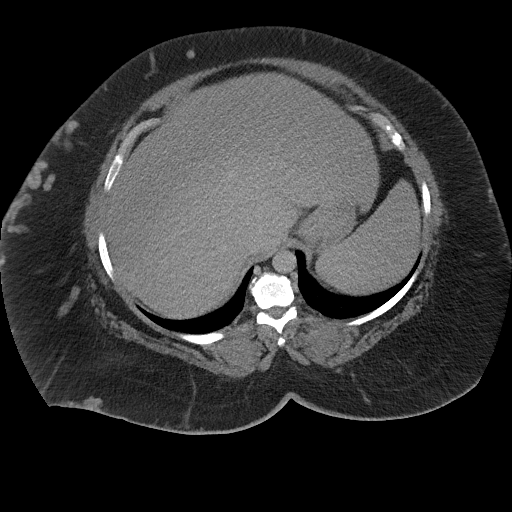
[im 96/101  soft-tissue]
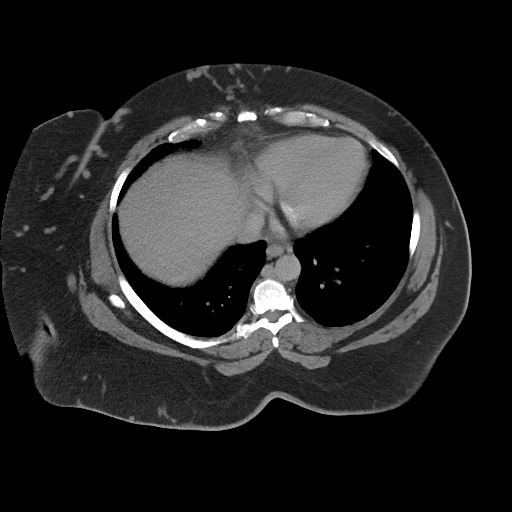

[Series 5: coronal soft tissue · coronal · 0.97mm/px · 3 of 145 slices shown]
[im 49/145  soft-tissue]
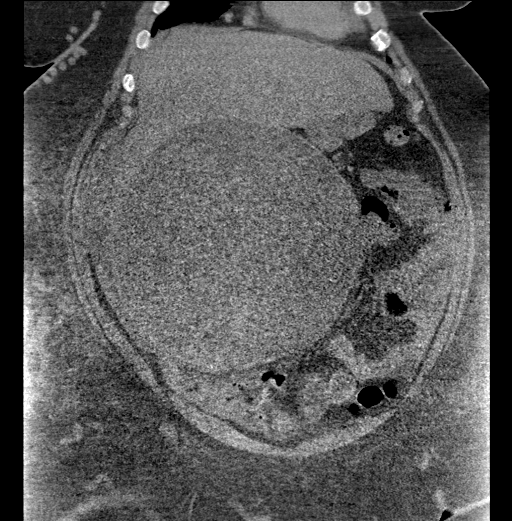
[im 65/145  soft-tissue]
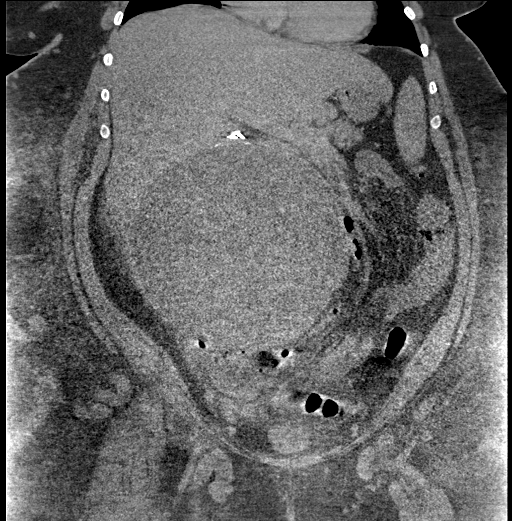
[im 81/145  soft-tissue]
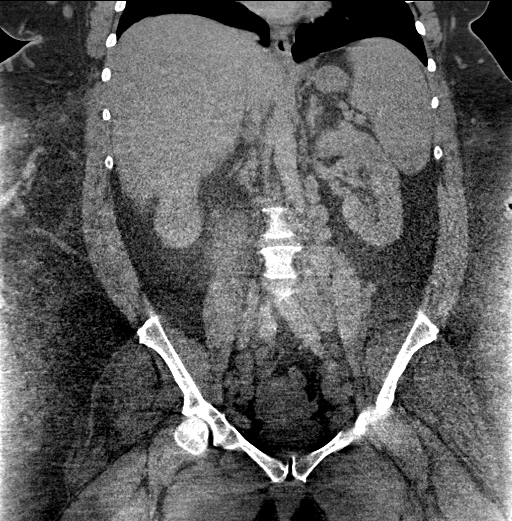

[15 of 46 positions shown; findings below may reference images not displayed]

RADIATION DOSE REDUCTION: This exam was performed according to the
departmental dose-optimization program which includes automated
exposure control, adjustment of the mA and/or kV according to
patient size and/or use of iterative reconstruction technique.

CONTRAST:  100mL OMNIPAQUE IOHEXOL 300 MG/ML  SOLN
FINDINGS: Lower chest: Chest 11 mm right lower lobe pulmonary nodule seen on
the previous study is not evident today but may not have been
included in the field of view.

Hepatobiliary: The liver shows diffusely decreased attenuation
suggesting fat deposition. Gallbladder surgically absent. No
intrahepatic or extrahepatic biliary dilation. As on prior studies,
there is a large cyst in the right abdomen and pelvis, measuring
24.2 x 24.3 x 24.6 cm today. This is been present since at least a
CTA of 02/05/2017. While previously described as hepatic origin, the
etiology of this cystic lesion is not definable on today's study.
It does generates substantial mass-effect on the liver parenchyma,
bowel, and retroperitoneal anatomy/IVC.

Pancreas: No focal mass lesion. No dilatation of the main duct. No
intraparenchymal cyst. No peripancreatic edema.

Spleen: No splenomegaly. No focal mass lesion.

Adrenals/Urinary Tract: No adrenal nodule or mass. Kidneys
unremarkable. No evidence for hydroureter. The urinary bladder
appears normal for the degree of distention.

Stomach/Bowel: Stomach is decompressed. No small bowel wall
thickening. No small bowel dilatation. Neither the terminal ileum
nor the appendix can be discretely identified. Colon is nondilated.

Vascular/Lymphatic: No abdominal aortic aneurysm. Substantial
mass-effect on the IVC due to the right abdominal cystic mass.
Patency of the IVC cannot be confirmed on this study. Upper normal
para-aortic lymph nodes are seen in the abdomen with similar mild
pelvic sidewall lymphadenopathy in bilateral groin lymphadenopathy.
1.9 cm short axis right groin node on 88/2 today was 2.0 cm short
axis previously. 2.0 cm short axis left groin node on 90/2 was
cm short axis previously. Vascular collateralization noted anterior
right abdominal wall.

Reproductive: Central pelvis not well seen due to artifact from body
habitus.

Other: Small volume free fluid seen adjacent to the liver and in the
cul-de-sac.

Musculoskeletal: Tiny hiatal hernia contains only fat. No worrisome
lytic or sclerotic osseous abnormality.
IMPRESSION: 1. No acute findings in the abdomen or pelvis. Specifically, no
findings to explain the patient's history of abdominal pain and
vomiting.
2. Interval progression of a large cystic mass in the right abdomen
and pelvis, present since at least 02/05/2017. Lesion measures
x 24.3 x 24.6 cm today compared to 19 x 17 x 18 cm previously. It
does generate substantial mass-effect on the liver parenchyma,
bowel, and retroperitoneal anatomy/IVC. Patency of the IVC cannot be
confirmed on this study.
3. Similar appearance of mild lymphadenopathy in the groin regions
bilaterally although mildly progressive on the left.
4. Small volume free fluid in the abdomen and pelvis.
5. 11 mm right lower lobe pulmonary nodule seen on the previous
study is not evident today but may not have been included in the
field of view.
6. Hepatic steatosis.

## 2023-04-26 IMAGING — US US ABDOMEN COMPLETE
1 series · 15 of 25 positions shown · non-contrast
Comparison: 04/19/2019

CLINICAL DATA: Abdominal pain x6 days

EXAM:
ABDOMEN ULTRASOUND COMPLETE

[Series 1: us abdomen complete mc & wl · 15 of 98 slices shown]
[im 1/98]
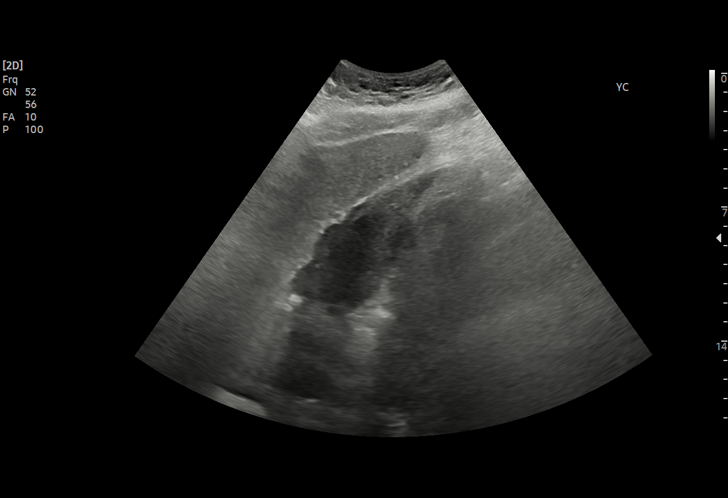
[im 9/98]
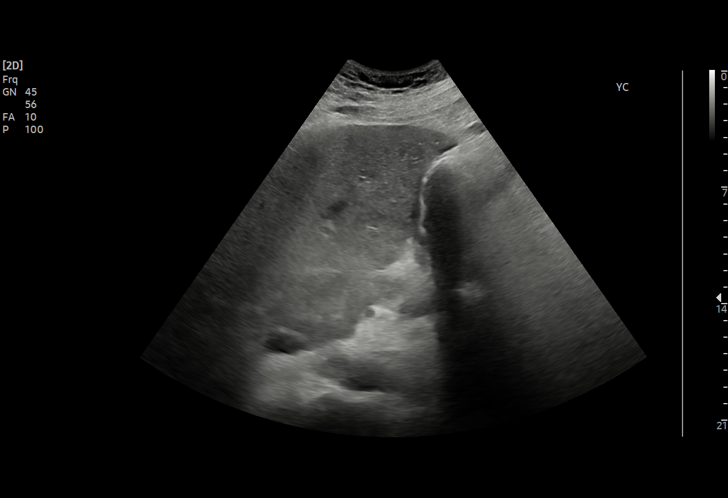
[im 17/98]
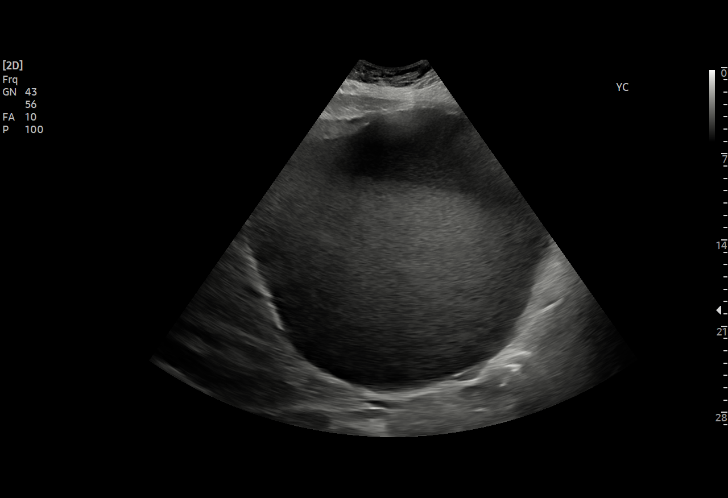
[im 21/98]
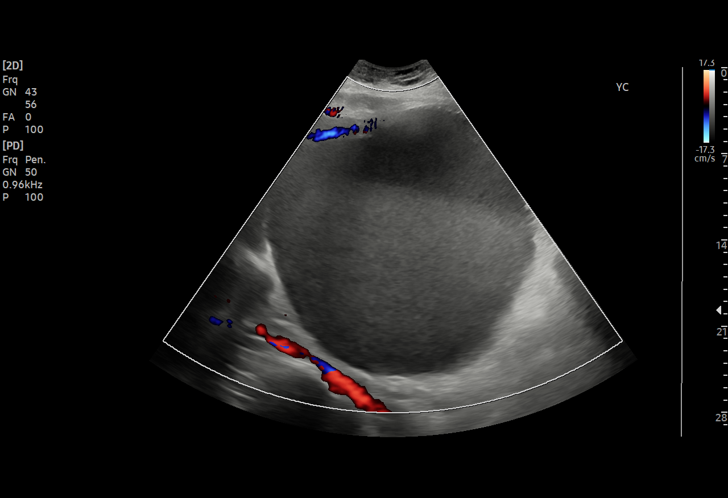
[im 29/98]
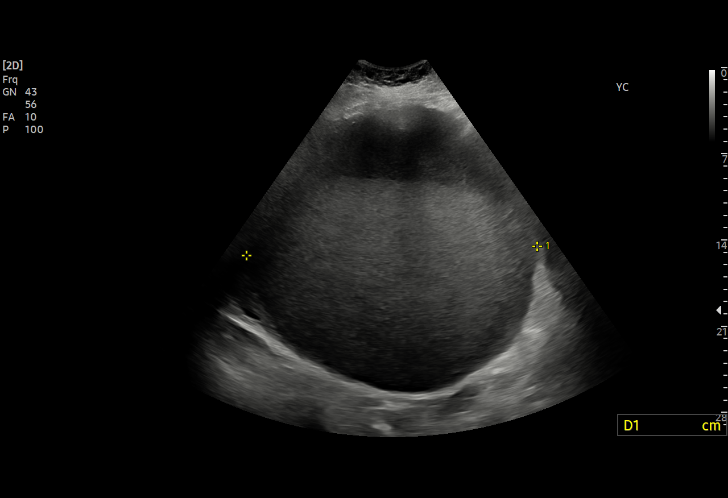
[im 37/98]
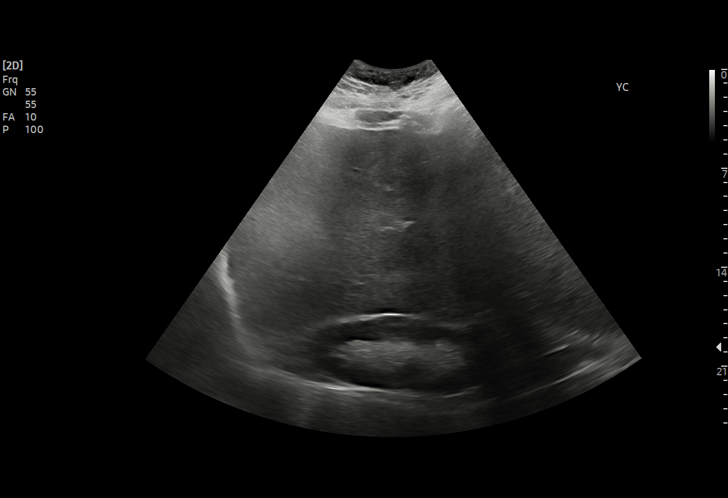
[im 41/98]
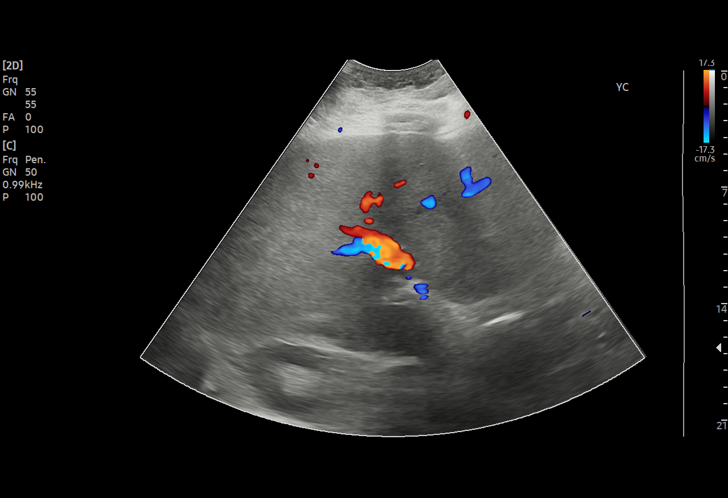
[im 49/98]
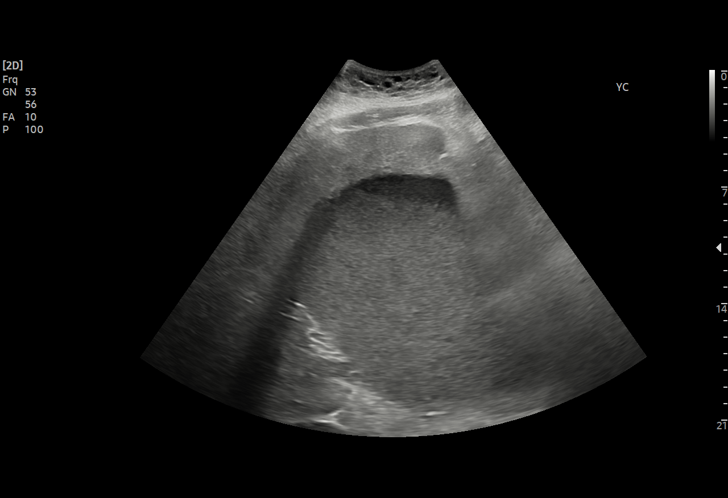
[im 57/98]
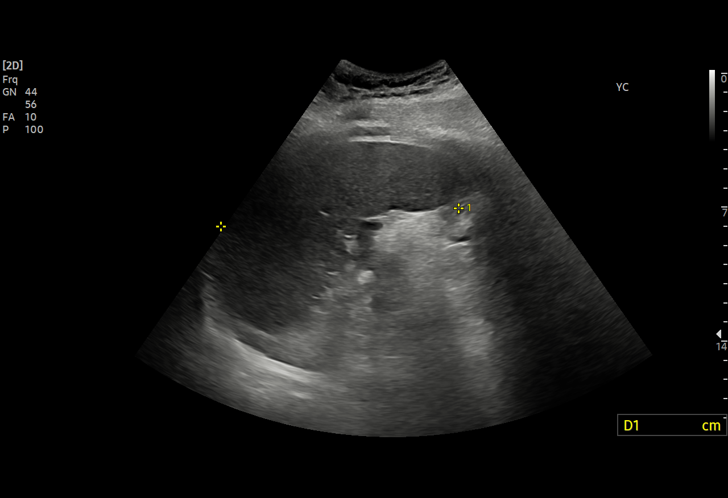
[im 61/98]
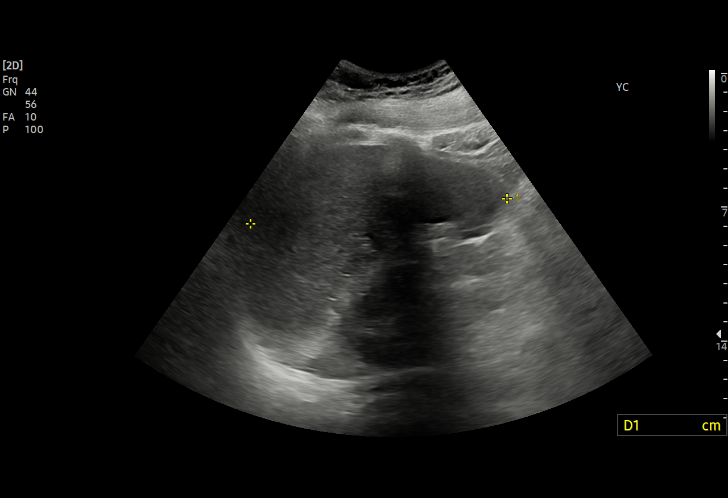
[im 69/98]
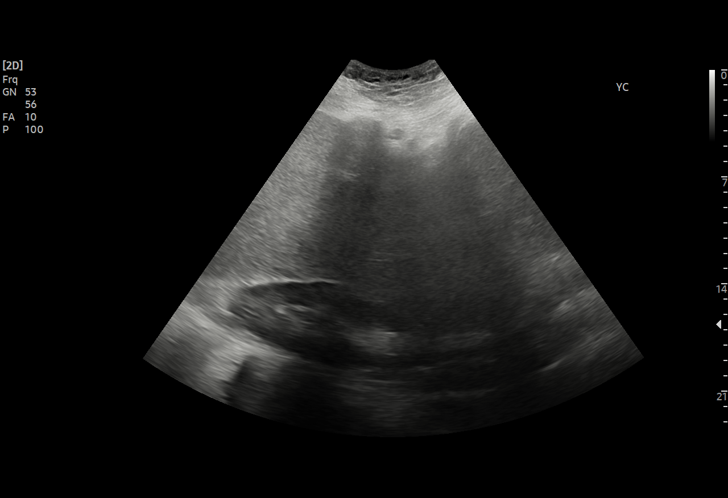
[im 77/98]
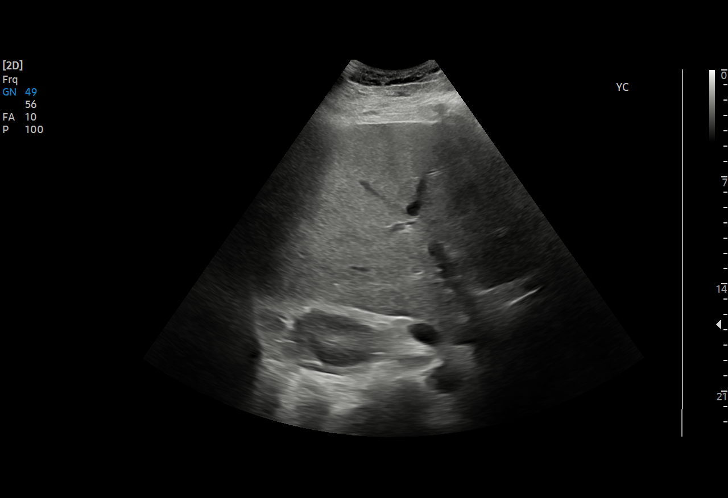
[im 81/98]
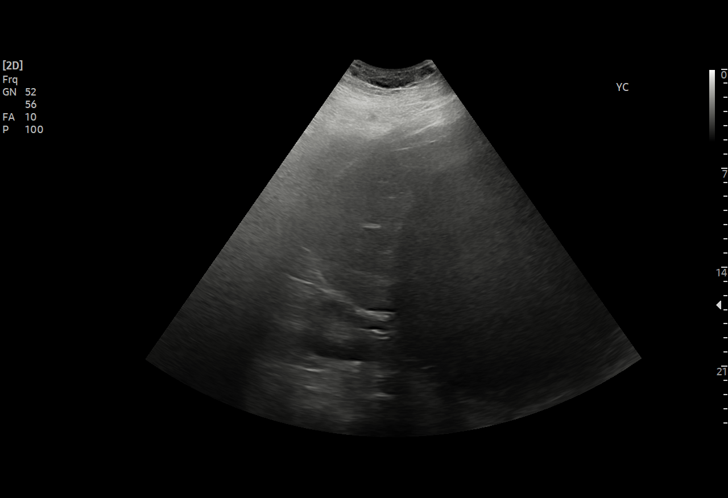
[im 89/98]
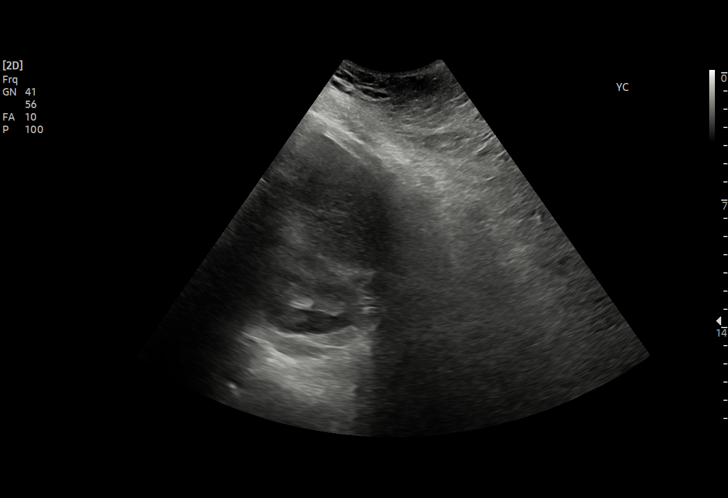
[im 98/98]
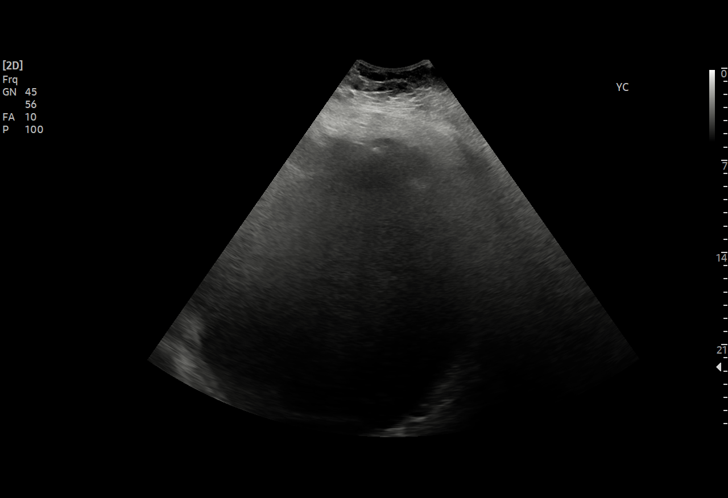

[15 of 25 positions shown; findings below may reference images not displayed]

FINDINGS: Gallbladder: Surgically absent.

Common bile duct: Diameter: 6 mm

Liver: Within normal limits hyperechoic hepatic parenchyma, raising
the possibility of hepatic steatosis. Exophytic cyst with layering
hemorrhage inferiorly along the liver, measuring 24.7 x 23.7 x
cm, previously 19 cm on CT. Portal vein is patent on color Doppler
imaging with normal direction of blood flow towards the liver.

IVC: No abnormality visualized.

Pancreas: Poorly visualized due to overlying bowel gas.

Spleen: At the upper limits of normal, measuring 13.4 cm.

Right Kidney: Length: 14.9 cm. Echogenicity within normal limits. No
mass or hydronephrosis visualized.

Left Kidney: Length: 15.1 cm. Echogenicity within normal limits. No
mass or hydronephrosis visualized.

Abdominal aorta: No aneurysm visualized.

Other findings: None.
IMPRESSION: 24.7 cm complex/hemorrhagic cyst inferiorly along the liver,
previously 19 cm.

Additional ancillary findings as above.

## 2023-04-29 ENCOUNTER — Ambulatory Visit: Payer: 59 | Attending: Critical Care Medicine | Admitting: Pharmacist

## 2023-04-29 DIAGNOSIS — I82501 Chronic embolism and thrombosis of unspecified deep veins of right lower extremity: Secondary | ICD-10-CM | POA: Diagnosis not present

## 2023-04-29 LAB — POCT INR: POC INR: 2.1

## 2023-05-08 ENCOUNTER — Other Ambulatory Visit: Payer: Self-pay | Admitting: Critical Care Medicine

## 2023-05-08 DIAGNOSIS — I82411 Acute embolism and thrombosis of right femoral vein: Secondary | ICD-10-CM

## 2023-05-08 NOTE — Telephone Encounter (Signed)
Unable to refill per protocol, Rx request is too soon. Last refill 01/28/23 for 90 and 1 refill.  Requested Prescriptions  Pending Prescriptions Disp Refills   warfarin (COUMADIN) 5 MG tablet [Pharmacy Med Name: WARFARIN SODIUM 5 MG TABLET] 84 tablet 2    Sig: TAKE 1 TABLET (5MG  TOTAL) ON MONDAYS AND FRIDAYS WITH 1/2 TABLET (2.5MG ) ALL OTHER DAYS.     Hematology:  Anticoagulants - warfarin Failed - 05/08/2023  1:00 PM      Failed - Manual Review: If patient's warfarin is managed by Anti-Coag team, route request to them. If not, route request to the provider.      Failed - HCT in normal range and within 360 days    Hematocrit  Date Value Ref Range Status  05/06/2022 36.1 34.0 - 46.6 % Final         Failed - Valid encounter within last 3 months    Recent Outpatient Visits           10 months ago Leg DVT (deep venous thromboembolism), chronic, left [facility])   [place] [facility] Storm Frisk, MD   1 year ago Candidemia [facility])   [place] [facility] Storm Frisk, MD   3 years ago PE (pulmonary thromboembolism) [facility])   [place] [facility] Storm Frisk, MD   3 years ago Acute deep vein thrombosis (DVT) of femoral vein of right lower extremity [facility])   [place] [facility] & [facility] Storm Frisk, MD   4 years ago PE (pulmonary thromboembolism) [facility])   [place] [facility] Storm Frisk, MD       Future Appointments             In 1 week Storm Frisk, MD [facility]            Passed - INR in normal range and within 30 days    POC INR  Date Value Ref Range Status  04/29/2023 2.1  Final         Passed - Patient is not pregnant       sertraline (ZOLOFT) 50 MG tablet [Pharmacy Med Name: SERTRALINE HCL 50 MG TABLET] 90 tablet 1    Sig: TAKE 1 TABLET BY MOUTH EVERY DAY     Psychiatry:   Antidepressants - SSRI - sertraline Failed - 05/08/2023  1:00 PM      Failed - AST in normal range and within 360 days    AST  Date Value Ref Range Status  05/06/2022 18 0 - 40 IU/L Final         Failed - ALT in normal range and within 360 days    ALT  Date Value Ref Range Status  05/06/2022 14 0 - 32 IU/L Final         Failed - Valid encounter within last 6 months    Recent Outpatient Visits           10 months ago Leg DVT (deep venous thromboembolism), chronic, left [facility])   [place] [facility] & [facility] Storm Frisk, MD   1 year ago Candidemia [facility])   [place] [facility] Storm Frisk, MD   3 years ago PE (pulmonary thromboembolism) [facility])   [place] [facility] & [facility] Storm Frisk, MD   3 years ago Acute  deep vein thrombosis (DVT) of femoral vein of right lower extremity [facility])   [place] [facility] & [facility] Storm Frisk, MD   4 years ago PE (pulmonary thromboembolism) [facility])   [place] [facility] Storm Frisk, MD       Future Appointments             In 1 week Storm Frisk, MD [facility]            Passed - Completed PHQ-2 or PHQ-9 in the last 360 days

## 2023-05-15 ENCOUNTER — Ambulatory Visit: Payer: 59 | Admitting: Critical Care Medicine

## 2023-06-05 ENCOUNTER — Ambulatory Visit: Payer: 59 | Admitting: Pharmacist

## 2023-06-05 NOTE — Progress Notes (Unsigned)
    Pharmacy Anticoagulation Clinic  Subjective: Patient presents today for INR monitoring. Anticoagulation indication is left DVT and PE.   Current dose of warfarin: 1 tablet (5 mg) on Monday and Friday, 1/2 tablet (2.5 mg) all other days  Adherence to warfarin:  Signs/symptoms of bleeding: Recent changes in diet: Recent changes in medications: Upcoming procedures that may impact anticoagulation:   Objective: Today's INR =   Lab Results  Component Value Date   INR 2.1 04/29/2023   INR 1.8 (A) 03/25/2023   INR 2.1 01/28/2023     Assessment and Plan: Anticoagulation: Patient is {therapeutic/subtherapeutic/supratherapeutic:12803} based on patient's INR of *** and patient's INR goal of {Inr target:12855}. Will {Add/stop/incr/decr:18779} current warfarin dose: ***  Patient verbalized understanding and was provided with written instructions. Next INR check planned for ***.

## 2023-06-12 NOTE — Progress Notes (Signed)
    Pharmacy Anticoagulation Clinic  Subjective: Patient presents today for INR monitoring. Anticoagulation indication is DVT/PE.   Current dose of warfarin:1 tablet (5 mg) mon, fri; 1/2 tablet (2.5 mg) all other days  Adherence to warfarin: reports adherence Signs/symptoms of bleeding: No  Recent changes in diet: No Recent changes in medications:No Upcoming procedures that may impact anticoagulation:No   Objective: Today's INR = 1.8  Lab Results  Component Value Date   INR 2.1 04/29/2023   INR 1.8 (A) 03/25/2023   INR 2.1 01/28/2023     Assessment and Plan: Anticoagulation: Patient is Subtherapeutic based on patient's INR of 1.8 and patient's INR goal of 2-3. Will Increase current warfarin dose: 1 tablet (5 mg) mon, wed, fri; 1/2 tablet (2.5 mg) all other days  Patient verbalized understanding and was provided with written instructions. Next INR check planned for 3-4 weeks.

## 2023-06-13 ENCOUNTER — Ambulatory Visit: Payer: 59 | Attending: Critical Care Medicine | Admitting: Pharmacist

## 2023-06-13 DIAGNOSIS — I82501 Chronic embolism and thrombosis of unspecified deep veins of right lower extremity: Secondary | ICD-10-CM | POA: Diagnosis not present

## 2023-06-13 LAB — POCT INR: POC INR: 1.8

## 2023-07-18 ENCOUNTER — Ambulatory Visit: Payer: 59 | Attending: Critical Care Medicine | Admitting: Pharmacist

## 2023-07-18 DIAGNOSIS — I82501 Chronic embolism and thrombosis of unspecified deep veins of right lower extremity: Secondary | ICD-10-CM | POA: Diagnosis not present

## 2023-07-18 LAB — POCT INR: POC INR: 2.6

## 2023-08-21 ENCOUNTER — Ambulatory Visit: Payer: 59 | Admitting: Pharmacist

## 2023-09-23 ENCOUNTER — Ambulatory Visit: Payer: 59 | Admitting: Pharmacist

## 2023-10-14 ENCOUNTER — Ambulatory Visit: Payer: 59 | Admitting: Pharmacist

## 2023-10-16 ENCOUNTER — Ambulatory Visit: Payer: 59 | Attending: Critical Care Medicine | Admitting: Pharmacist

## 2023-10-16 DIAGNOSIS — I82501 Chronic embolism and thrombosis of unspecified deep veins of right lower extremity: Secondary | ICD-10-CM | POA: Diagnosis not present

## 2023-10-16 LAB — POCT INR: POC INR: 2

## 2023-11-27 ENCOUNTER — Ambulatory Visit: Payer: 59 | Admitting: Pharmacist

## 2023-12-08 ENCOUNTER — Ambulatory Visit: Payer: 59 | Admitting: Pharmacist

## 2023-12-11 ENCOUNTER — Ambulatory Visit: Payer: 59 | Attending: Critical Care Medicine | Admitting: Pharmacist

## 2023-12-11 DIAGNOSIS — I82501 Chronic embolism and thrombosis of unspecified deep veins of right lower extremity: Secondary | ICD-10-CM

## 2023-12-11 LAB — POCT INR: POC INR: 1.6

## 2023-12-28 ENCOUNTER — Other Ambulatory Visit: Payer: Self-pay | Admitting: Critical Care Medicine

## 2023-12-28 DIAGNOSIS — I82411 Acute embolism and thrombosis of right femoral vein: Secondary | ICD-10-CM

## 2024-01-08 ENCOUNTER — Ambulatory Visit: Payer: 59 | Admitting: Pharmacist

## 2024-01-10 ENCOUNTER — Other Ambulatory Visit: Payer: Self-pay | Admitting: Critical Care Medicine

## 2024-01-16 ENCOUNTER — Ambulatory Visit: Payer: 59 | Attending: Critical Care Medicine | Admitting: Pharmacist

## 2024-01-16 DIAGNOSIS — I82501 Chronic embolism and thrombosis of unspecified deep veins of right lower extremity: Secondary | ICD-10-CM

## 2024-01-16 LAB — POCT INR: POC INR: 2.2

## 2024-02-20 ENCOUNTER — Ambulatory Visit: Payer: 59 | Admitting: Pharmacist

## 2024-03-18 ENCOUNTER — Ambulatory Visit: Payer: 59 | Admitting: Pharmacist

## 2024-04-20 ENCOUNTER — Ambulatory Visit: Admitting: Pharmacist

## 2024-04-30 ENCOUNTER — Ambulatory Visit: Admitting: Pharmacist

## 2024-06-01 ENCOUNTER — Ambulatory Visit: Attending: Critical Care Medicine | Admitting: Pharmacist

## 2024-06-01 DIAGNOSIS — I82411 Acute embolism and thrombosis of right femoral vein: Secondary | ICD-10-CM

## 2024-06-01 DIAGNOSIS — I82501 Chronic embolism and thrombosis of unspecified deep veins of right lower extremity: Secondary | ICD-10-CM | POA: Diagnosis not present

## 2024-06-01 LAB — POCT INR: POC INR: 2.2

## 2024-06-01 MED ORDER — WARFARIN SODIUM 5 MG PO TABS: ORAL_TABLET | ORAL | 2 refills | Status: DC

## 2024-07-09 ENCOUNTER — Telehealth: Payer: Self-pay | Admitting: Critical Care Medicine

## 2024-07-09 NOTE — Telephone Encounter (Signed)
 Called patient, no answer. Left voicemail confirming upcoming appointment on 07/13/2024 at 4:00 pm with Clinical Pharmacist. Provided callback number for any questions or changes.

## 2024-07-13 ENCOUNTER — Ambulatory Visit: Admitting: Pharmacist

## 2024-08-05 ENCOUNTER — Telehealth: Payer: Self-pay | Admitting: Critical Care Medicine

## 2024-08-05 NOTE — Telephone Encounter (Signed)
 Called to confirm appt LVM

## 2024-08-06 ENCOUNTER — Ambulatory Visit: Admitting: Pharmacist

## 2024-08-12 ENCOUNTER — Observation Stay (HOSPITAL_COMMUNITY)

## 2024-08-12 ENCOUNTER — Inpatient Hospital Stay (HOSPITAL_COMMUNITY)
Admission: EM | Admit: 2024-08-12 | Discharge: 2024-08-19 | DRG: 371 | Disposition: A | Discharge status: Home Health | Attending: Internal Medicine | Admitting: Internal Medicine

## 2024-08-12 ENCOUNTER — Emergency Department (HOSPITAL_COMMUNITY)

## 2024-08-12 ENCOUNTER — Other Ambulatory Visit: Payer: Self-pay

## 2024-08-12 DIAGNOSIS — D72829 Elevated white blood cell count, unspecified: Secondary | ICD-10-CM

## 2024-08-12 DIAGNOSIS — R002 Palpitations: Secondary | ICD-10-CM

## 2024-08-12 DIAGNOSIS — R0602 Shortness of breath: Secondary | ICD-10-CM | POA: Diagnosis not present

## 2024-08-12 DIAGNOSIS — Z91048 Other nonmedicinal substance allergy status: Secondary | ICD-10-CM

## 2024-08-12 DIAGNOSIS — D251 Intramural leiomyoma of uterus: Secondary | ICD-10-CM | POA: Diagnosis present

## 2024-08-12 DIAGNOSIS — Z833 Family history of diabetes mellitus: Secondary | ICD-10-CM

## 2024-08-12 DIAGNOSIS — Z9049 Acquired absence of other specified parts of digestive tract: Secondary | ICD-10-CM

## 2024-08-12 DIAGNOSIS — Z6841 Body Mass Index (BMI) 40.0 and over, adult: Secondary | ICD-10-CM

## 2024-08-12 DIAGNOSIS — Z8349 Family history of other endocrine, nutritional and metabolic diseases: Secondary | ICD-10-CM

## 2024-08-12 DIAGNOSIS — I890 Lymphedema, not elsewhere classified: Secondary | ICD-10-CM | POA: Diagnosis present

## 2024-08-12 DIAGNOSIS — R55 Syncope and collapse: Secondary | ICD-10-CM | POA: Diagnosis not present

## 2024-08-12 DIAGNOSIS — Z86718 Personal history of other venous thrombosis and embolism: Secondary | ICD-10-CM

## 2024-08-12 DIAGNOSIS — R1011 Right upper quadrant pain: Secondary | ICD-10-CM | POA: Diagnosis present

## 2024-08-12 DIAGNOSIS — N179 Acute kidney failure, unspecified: Secondary | ICD-10-CM | POA: Diagnosis present

## 2024-08-12 DIAGNOSIS — E119 Type 2 diabetes mellitus without complications: Secondary | ICD-10-CM | POA: Diagnosis present

## 2024-08-12 DIAGNOSIS — K7689 Other specified diseases of liver: Principal | ICD-10-CM

## 2024-08-12 DIAGNOSIS — K760 Fatty (change of) liver, not elsewhere classified: Secondary | ICD-10-CM | POA: Diagnosis present

## 2024-08-12 DIAGNOSIS — D649 Anemia, unspecified: Secondary | ICD-10-CM | POA: Diagnosis present

## 2024-08-12 DIAGNOSIS — R188 Other ascites: Secondary | ICD-10-CM

## 2024-08-12 DIAGNOSIS — R0789 Other chest pain: Principal | ICD-10-CM

## 2024-08-12 DIAGNOSIS — K651 Peritoneal abscess: Principal | ICD-10-CM | POA: Diagnosis present

## 2024-08-12 DIAGNOSIS — Z79899 Other long term (current) drug therapy: Secondary | ICD-10-CM

## 2024-08-12 DIAGNOSIS — Z8619 Personal history of other infectious and parasitic diseases: Secondary | ICD-10-CM

## 2024-08-12 DIAGNOSIS — I472 Ventricular tachycardia, unspecified: Secondary | ICD-10-CM | POA: Diagnosis present

## 2024-08-12 DIAGNOSIS — R000 Tachycardia, unspecified: Secondary | ICD-10-CM | POA: Diagnosis not present

## 2024-08-12 DIAGNOSIS — Z803 Family history of malignant neoplasm of breast: Secondary | ICD-10-CM

## 2024-08-12 DIAGNOSIS — K750 Abscess of liver: Secondary | ICD-10-CM | POA: Diagnosis present

## 2024-08-12 DIAGNOSIS — R7989 Other specified abnormal findings of blood chemistry: Secondary | ICD-10-CM | POA: Diagnosis present

## 2024-08-12 DIAGNOSIS — Z7901 Long term (current) use of anticoagulants: Secondary | ICD-10-CM

## 2024-08-12 DIAGNOSIS — Z1611 Resistance to penicillins: Secondary | ICD-10-CM | POA: Diagnosis present

## 2024-08-12 DIAGNOSIS — Z86711 Personal history of pulmonary embolism: Secondary | ICD-10-CM

## 2024-08-12 DIAGNOSIS — R9389 Abnormal findings on diagnostic imaging of other specified body structures: Secondary | ICD-10-CM | POA: Diagnosis present

## 2024-08-12 DIAGNOSIS — Z8249 Family history of ischemic heart disease and other diseases of the circulatory system: Secondary | ICD-10-CM

## 2024-08-12 DIAGNOSIS — Z823 Family history of stroke: Secondary | ICD-10-CM

## 2024-08-12 DIAGNOSIS — B961 Klebsiella pneumoniae [K. pneumoniae] as the cause of diseases classified elsewhere: Secondary | ICD-10-CM | POA: Diagnosis present

## 2024-08-12 DIAGNOSIS — I10 Essential (primary) hypertension: Secondary | ICD-10-CM | POA: Diagnosis present

## 2024-08-12 DIAGNOSIS — E871 Hypo-osmolality and hyponatremia: Secondary | ICD-10-CM | POA: Diagnosis present

## 2024-08-12 HISTORY — PX: IR US GUIDE BX ASP/DRAIN: IMG2392

## 2024-08-12 LAB — CBC WITH DIFFERENTIAL/PLATELET
Abs Immature Granulocytes: 0.32 K/uL — ABNORMAL HIGH (ref 0.00–0.07)
Basophils Absolute: 0 K/uL (ref 0.0–0.1)
Basophils Relative: 0 %
Eosinophils Absolute: 0.1 K/uL (ref 0.0–0.5)
Eosinophils Relative: 0 %
HCT: 33.4 % — ABNORMAL LOW (ref 36.0–46.0)
Hemoglobin: 9.7 g/dL — ABNORMAL LOW (ref 12.0–15.0)
Immature Granulocytes: 2 %
Lymphocytes Relative: 8 %
Lymphs Abs: 1.2 K/uL (ref 0.7–4.0)
MCH: 24.2 pg — ABNORMAL LOW (ref 26.0–34.0)
MCHC: 29 g/dL — ABNORMAL LOW (ref 30.0–36.0)
MCV: 83.3 fL (ref 80.0–100.0)
Monocytes Absolute: 1.2 K/uL — ABNORMAL HIGH (ref 0.1–1.0)
Monocytes Relative: 9 %
Neutro Abs: 11.3 K/uL — ABNORMAL HIGH (ref 1.7–7.7)
Neutrophils Relative %: 81 %
Platelets: 432 K/uL — ABNORMAL HIGH (ref 150–400)
RBC: 4.01 MIL/uL (ref 3.87–5.11)
RDW: 16.7 % — ABNORMAL HIGH (ref 11.5–15.5)
WBC: 14.2 K/uL — ABNORMAL HIGH (ref 4.0–10.5)
nRBC: 0 % (ref 0.0–0.2)

## 2024-08-12 LAB — LACTIC ACID, PLASMA
Lactic Acid, Venous: 2.5 mmol/L (ref 0.5–1.9)
Lactic Acid, Venous: 1.5 mmol/L (ref 0.5–1.9)

## 2024-08-12 LAB — TROPONIN I (HIGH SENSITIVITY)
Troponin I (High Sensitivity): 10 ng/L (ref <18)
Troponin I (High Sensitivity): 11 ng/L (ref <18)

## 2024-08-12 LAB — ECHOCARDIOGRAM COMPLETE
Area-P 1/2: 5.54 cm2
S' Lateral: 2.97 cm
Weight: 9296 [oz_av]

## 2024-08-12 LAB — COMPREHENSIVE METABOLIC PANEL WITH GFR
ALT: 10 U/L (ref 0–44)
AST: 16 U/L (ref 15–41)
Albumin: 2.2 g/dL — ABNORMAL LOW (ref 3.5–5.0)
Alkaline Phosphatase: 103 U/L (ref 38–126)
Anion gap: 16 — ABNORMAL HIGH (ref 5–15)
BUN: 12 mg/dL (ref 6–20)
CO2: 21 mmol/L — ABNORMAL LOW (ref 22–32)
Calcium: 8.3 mg/dL — ABNORMAL LOW (ref 8.9–10.3)
Chloride: 100 mmol/L (ref 98–111)
Creatinine, Ser: 1.1 mg/dL — ABNORMAL HIGH (ref 0.44–1.00)
GFR, Estimated: >60 mL/min (ref >60)
Glucose, Bld: 179 mg/dL — ABNORMAL HIGH (ref 70–99)
Potassium: 3.9 mmol/L (ref 3.5–5.1)
Sodium: 137 mmol/L (ref 135–145)
Total Bilirubin: 1.2 mg/dL (ref 0.0–1.2)
Total Protein: 7.8 g/dL (ref 6.5–8.1)

## 2024-08-12 LAB — FOLATE: Folate: 4.1 ng/mL — ABNORMAL LOW (ref >5.9)

## 2024-08-12 LAB — I-STAT CG4 LACTIC ACID, ED: Lactic Acid, Venous: 2 mmol/L (ref 0.5–1.9)

## 2024-08-12 LAB — PROCALCITONIN: Procalcitonin: 0.95 ng/mL

## 2024-08-12 LAB — LIPASE, BLOOD: Lipase: 29 U/L (ref 11–51)

## 2024-08-12 LAB — PROTIME-INR
INR: 2.8 — ABNORMAL HIGH (ref 0.8–1.2)
Prothrombin Time: 31.1 s — ABNORMAL HIGH (ref 11.4–15.2)

## 2024-08-12 LAB — HIV ANTIBODY (ROUTINE TESTING W REFLEX): HIV Screen 4th Generation wRfx: NONREACTIVE

## 2024-08-12 LAB — D-DIMER, QUANTITATIVE: D-Dimer, Quant: 3.88 ug{FEU}/mL — ABNORMAL HIGH (ref 0.00–0.50)

## 2024-08-12 LAB — HCG, SERUM, QUALITATIVE: Preg, Serum: NEGATIVE

## 2024-08-12 LAB — VITAMIN B12: Vitamin B-12: 185 pg/mL (ref 180–914)

## 2024-08-12 MED ORDER — OXYCODONE HCL 5 MG PO TABS
5.0000 mg | ORAL_TABLET | ORAL | Status: DC | PRN
  Administered 2024-08-12 – 2024-08-18 (×17): 5 mg via ORAL
  Filled 2024-08-12 (×18): qty 1

## 2024-08-12 MED ORDER — LACTATED RINGERS IV BOLUS
1000.0000 mL | Freq: Once | INTRAVENOUS | Status: AC
  Administered 2024-08-12: 1000 mL via INTRAVENOUS

## 2024-08-12 MED ORDER — LIDOCAINE-EPINEPHRINE 1 %-1:100000 IJ SOLN
20.0000 mL | Freq: Once | INTRAMUSCULAR | Status: AC
  Administered 2024-08-12: 10 mL

## 2024-08-12 MED ORDER — WARFARIN SODIUM 2.5 MG PO TABS
2.5000 mg | ORAL_TABLET | Freq: Once | ORAL | Status: AC
  Administered 2024-08-12: 2.5 mg via ORAL
  Filled 2024-08-12 (×2): qty 1

## 2024-08-12 MED ORDER — SENNOSIDES-DOCUSATE SODIUM 8.6-50 MG PO TABS
1.0000 | ORAL_TABLET | Freq: Every evening | ORAL | Status: DC | PRN
  Administered 2024-08-17 – 2024-08-18 (×2): 1 via ORAL
  Filled 2024-08-12 (×2): qty 1

## 2024-08-12 MED ORDER — ACETAMINOPHEN 325 MG PO TABS: 650.0000 mg | ORAL_TABLET | Freq: Four times a day (QID) | ORAL | Status: DC | PRN

## 2024-08-12 MED ORDER — ACETAMINOPHEN 500 MG PO TABS
1000.0000 mg | ORAL_TABLET | Freq: Four times a day (QID) | ORAL | Status: DC | PRN
  Administered 2024-08-12 – 2024-08-13 (×2): 1000 mg via ORAL
  Filled 2024-08-12 (×2): qty 2

## 2024-08-12 MED ORDER — SODIUM CHLORIDE 0.9 % IV SOLN
3.0000 g | Freq: Four times a day (QID) | INTRAVENOUS | Status: DC
  Administered 2024-08-12 – 2024-08-13 (×4): 3 g via INTRAVENOUS
  Filled 2024-08-12 (×6): qty 8

## 2024-08-12 MED ORDER — FLUCONAZOLE 200 MG PO TABS
400.0000 mg | ORAL_TABLET | Freq: Every day | ORAL | Status: DC
  Filled 2024-08-12: qty 2

## 2024-08-12 MED ORDER — PIPERACILLIN-TAZOBACTAM 3.375 G IVPB: 3.3750 g | Freq: Four times a day (QID) | INTRAVENOUS | Status: DC

## 2024-08-12 MED ORDER — LACTATED RINGERS IV BOLUS: 1000.0000 mL | Freq: Once | INTRAVENOUS | Status: DC

## 2024-08-12 MED ORDER — LACTATED RINGERS IV BOLUS: 500.0000 mL | Freq: Once | INTRAVENOUS | Status: DC

## 2024-08-12 MED ORDER — ACETAMINOPHEN 650 MG RE SUPP: 650.0000 mg | Freq: Four times a day (QID) | RECTAL | Status: DC | PRN

## 2024-08-12 MED ORDER — LIDOCAINE-EPINEPHRINE 1 %-1:100000 IJ SOLN
INTRAMUSCULAR | Status: AC
  Filled 2024-08-12: qty 1

## 2024-08-12 MED ORDER — PIPERACILLIN-TAZOBACTAM 3.375 G IVPB: 3.3750 g | Freq: Three times a day (TID) | INTRAVENOUS | Status: DC

## 2024-08-12 MED ORDER — WARFARIN - PHARMACIST DOSING INPATIENT: Freq: Every day | Status: DC

## 2024-08-12 MED ORDER — PERFLUTREN LIPID MICROSPHERE
1.0000 mL | INTRAVENOUS | Status: AC | PRN
  Administered 2024-08-12: 5 mL via INTRAVENOUS

## 2024-08-12 MED ORDER — LACTATED RINGERS IV BOLUS
500.0000 mL | Freq: Once | INTRAVENOUS | Status: AC
  Administered 2024-08-12: 500 mL via INTRAVENOUS

## 2024-08-12 NOTE — Progress Notes (Signed)
 Patient ambulated independently to bathroom and back to her bed with very minimal assistance to exit bed and stand-by for safety.

## 2024-08-12 NOTE — ED Notes (Signed)
 Pt transported to IR

## 2024-08-12 NOTE — ED Triage Notes (Signed)
 Pt c/o palpitations and intermittent cp x 1 day. Pt denies any radiation. Endorses SOB w/ exertion. Pt also c/o right sided flank pain x 6 days. Denies hx of kidney stones, or urinary symptoms. Foul smelling urine noted from EMS stretcher to bed. Hx of bilateral PE. 200ml NS given PTA

## 2024-08-12 NOTE — Hospital Course (Addendum)
 ession of the large cystic mass in the right abdomen and pelvis measuring about 24 x 24 x 24 cm and hepatic cirrhosis but no acute finding.  She had leukocytosis to 17.6.  INR was supratherapeutic.  She was started on IV Zosyn .  IR consulted and planning drainage once INR allows. Blood culture with Candida parapsilosis in 1 out of 2 bottles.  ID consulted and started micafungin  in addition to IV Zosyn .  TTE without significant finding of vegetation.  ID recommending TEE.  Repeat blood culture on 4/28 NGTD. INR reversed with FFP and vitamin K .  She had US  guided aspiration of large abdominal cystic mass that yielded 7.2 L brown debris-filled cystic fluid on 5/1.TEE on 5/2 without vegetation.   Blood culture at that time grew Candid parapsilosis in 1/2 bottles. Was started on micafungin  then transitioned to fluconazole . Fluid culture w/o growth then.   8/22: Patient reports Everything feels better. Abdominal pain is improved since they drained the cyst. She is not SOB, denies cough, and maintains an appropriate appetite and bowel movements. She only reports that she feels weak, but is still improved from yesterday.   8/24: endorses stabbing pain, about 3-4/10. Knows that she will have procedure tomorrow. No other concerns today  8/25: no fevers, cold, no urinary symtoms, no SOB, no bleeding Orders: b12 capsule

## 2024-08-12 NOTE — ED Provider Notes (Signed)
 Blood pressure 137/73, pulse (!) 115, temperature 99.7 F (37.6 C), temperature source Oral, resp. rate 18, weight (!) 263.5 kg, SpO2 99%.  Assuming care from Dr. Haze.  In short, Susan Warren is a 48 y.o. female with a chief complaint of multiple complaints  .  Refer to the original H&P for additional details.  The current plan of care is to f/u on labs and reassess.  INR is therapeutic with elevated D-dimer.  Patient remains tachycardiac with some intermittent pain into her chest which is fairly atypical.  Patient is relatively high risk for PE as well as ACS.  Plan for observation admit.  Will follow UA.  After discussion with IM teaching service team we will move forward with portable chest x-ray although review is likely to be fairly limited given habitus.   08:28 AM  Discussed patient's case with IM teaching service to request admission. Patient and family (if present) updated with plan.   I reviewed all nursing notes, vitals, pertinent old records, EKGs, labs, imaging (as available).     Darra Fonda MATSU, MD 08/12/24 [phone]

## 2024-08-12 NOTE — Consult Note (Signed)
 [facility]  Total days of antibiotics 1       Reason for Consult: infected hepatic cyst    Referring Physician: winfrey  Principal Problem:   RUQ abdominal pain Active Problems:   Hypertension   History of pulmonary embolism   Lymphedema of both lower extremities    HPI: Susan Warren is a 48 y.o. female with hx of hypertension, hx of PE on warfarin, obesity, hx of having hepatic abscess in setting of candidemia in may 2023, with IR drain in setting of 7L aspirated. She recalls having supratherapeutic INR with fluconazole . Most recently she reports being unwell for roughly 2-3 weeks, and in the last week starting to have subjective fever, chils, nightsweats, in addition to localized RUQ pain with N but no V. Has been on amox/clav for 5 days thought to have sinus infection. On admit, she is found to have leukocytosis of 14K, imaging shows impressive 24 x 24 x 24 large cystic mass that generating mass effect. She had IR aspiration but unclear why not drain placed given size of cystic lesion. Cultures sent gram stain showing GNR.  Past Medical History:  Diagnosis Date   Abdominal pain due to right abdominal and pelvic cystic mass 04/16/2022   Allergy    Anxiety    Candidemia (HCC) 04/18/2022   Depression    DVT (deep venous thrombosis) (HCC)    GERD (gastroesophageal reflux disease)    Hepatic cyst    Hypertension    Lymphedema of both lower extremities    Migraines    Morbid obesity with BMI of 70 and over, adult [facility])    PE (pulmonary thromboembolism) (HCC) 03/2019   Pneumonia 04/09/2022   Sleep apnea     Allergies:  Allergies  Allergen Reactions   Adhesive [Tape] Anaphylaxis and Swelling    *Adhesive Spray*     Current antibiotics:   MEDICATIONS:  fluconazole   400 mg Oral Daily   Warfarin - Pharmacist Dosing Inpatient   Does not apply q1600    Social History   Tobacco Use   Smoking status: Never   Smokeless tobacco: Never  Vaping  Use   Vaping status: Never Used  Substance Use Topics   Alcohol use: Yes    Comment: occ   Drug use: No    Family History  Problem Relation Age of Onset   Hypertension Mother    Thyroid disease Mother    Hypertension Maternal Grandmother    Diabetes Maternal Grandmother    Breast cancer Maternal Grandmother    Stroke Maternal Grandmother    Heart attack Neg Hx     Review of Systems -  12 point ros reviewed. Positive pertinents listed above  OBJECTIVE: Temp:  [98.3 F (36.8 C)-99.7 F (37.6 C)] 98.3 F (36.8 C) (08/21 1620) Pulse Rate:  [99-121] 99 (08/21 1620) Resp:  [18-36] 20 (08/21 1620) BP: (106-137)/(50-76) 118/50 (08/21 1620) SpO2:  [95 %-99 %] 95 % (08/21 1620) Weight:  [263.5 kg] 263.5 kg (08/21 0549) Physical Exam  Constitutional:  oriented to person, place, and time. appears well-developed and well-nourished. No distress.  HENT: [place]/AT, PERRLA, no scleral icterus Mouth/Throat: Oropharynx is clear and moist. No oropharyngeal exudate.  Cardiovascular: Normal rate, regular rhythm and normal heart sounds. Exam reveals no gallop and no friction rub.  No murmur heard.  Pulmonary/Chest: Effort normal and breath sounds normal. No respiratory distress.  has no wheezes.  Neck = supple, no nuchal rigidity Abdominal: Soft. Bowel sounds  are normal.  exhibits no distension. There is no tenderness.  Lymphadenopathy: no cervical adenopathy. No axillary adenopathy Neurological: alert and oriented to person, place, and time.  Skin: Skin is warm and dry. No rash noted. No erythema.  Psychiatric: a normal mood and affect.  behavior is normal.   LABS: Results for orders placed or performed during the hospital encounter of 08/12/24 (from the past 48 hours)  I-Stat CG4 Lactic Acid     Status: Abnormal   Collection Time: 08/12/24  6:57 AM  Result Value Ref Range   Lactic Acid, Venous 2.0 (HH) 0.5 - 1.9 mmol/L   Comment NOTIFIED PHYSICIAN   Comprehensive metabolic panel      Status: Abnormal   Collection Time: 08/12/24  6:58 AM  Result Value Ref Range   Sodium 137 135 - 145 mmol/L   Potassium 3.9 3.5 - 5.1 mmol/L   Chloride 100 98 - 111 mmol/L   CO2 21 (L) 22 - 32 mmol/L   Glucose, Bld 179 (H) 70 - 99 mg/dL    Comment: Glucose reference range applies only to samples taken after fasting for at least 8 hours.   BUN 12 6 - 20 mg/dL   Creatinine, Ser 8.89 (H) 0.44 - 1.00 mg/dL   Calcium 8.3 (L) 8.9 - 10.3 mg/dL   Total Protein 7.8 6.5 - 8.1 g/dL   Albumin 2.2 (L) 3.5 - 5.0 g/dL   AST 16 15 - 41 U/L   ALT 10 0 - 44 U/L   Alkaline Phosphatase 103 38 - 126 U/L   Total Bilirubin 1.2 0.0 - 1.2 mg/dL   GFR, Estimated >39 >39 mL/min    Comment: (NOTE) Calculated using the CKD-EPI Creatinine Equation (2021)    Anion gap 16 (H) 5 - 15    Comment: Performed at [facility], [address] [place], [place] [address]  Lipase, blood     Status: None   Collection Time: 08/12/24  6:58 AM  Result Value Ref Range   Lipase 29 11 - 51 U/L    Comment: Performed at [facility], [address] [place], [place] [address]  hCG, serum, qualitative     Status: None   Collection Time: 08/12/24  6:58 AM  Result Value Ref Range   Preg, Serum NEGATIVE NEGATIVE    Comment:        THE SENSITIVITY OF THIS METHODOLOGY IS >10 mIU/mL. Performed at [facility], [address], [place], [place] [address]   CBC with Differential     Status: Abnormal   Collection Time: 08/12/24  6:58 AM  Result Value Ref Range   WBC 14.2 (H) 4.0 - 10.5 K/uL   RBC 4.01 3.87 - 5.11 MIL/uL   Hemoglobin 9.7 (L) 12.0 - 15.0 g/dL   HCT 66.5 (L) 63.9 - 53.9 %   MCV 83.3 80.0 - 100.0 fL   MCH 24.2 (L) 26.0 - 34.0 pg   MCHC 29.0 (L) 30.0 - 36.0 g/dL   RDW 83.2 (H) 88.4 - 84.4 %   Platelets 432 (H) 150 - 400 K/uL   nRBC 0.0 0.0 - 0.2 %   Neutrophils Relative % 81 %   Neutro Abs 11.3 (H) 1.7 - 7.7 K/uL   Lymphocytes Relative 8 %   Lymphs Abs 1.2 0.7 - 4.0 K/uL   Monocytes Relative  9 %   Monocytes Absolute 1.2 (H) 0.1 - 1.0 K/uL   Eosinophils Relative 0 %   Eosinophils Absolute  0.1 0.0 - 0.5 K/uL   Basophils Relative 0 %   Basophils Absolute 0.0 0.0 - 0.1 K/uL   Immature Granulocytes 2 %   Abs Immature Granulocytes 0.32 (H) 0.00 - 0.07 K/uL    Comment: Performed at [facility], [address], [place], [place] [address]  Troponin I (High Sensitivity)     Status: None   Collection Time: 08/12/24  6:58 AM  Result Value Ref Range   Troponin I (High Sensitivity) 10 <18 ng/L    Comment: (NOTE) Elevated high sensitivity troponin I (hsTnI) values and significant  changes across serial measurements may suggest ACS but many other  chronic and acute conditions are known to elevate hsTnI results.  Refer to the Links section for chest pain algorithms and additional  guidance. Performed at [facility], [address], [place], [place] [address]   Procalcitonin     Status: None   Collection Time: 08/12/24  6:58 AM  Result Value Ref Range   Procalcitonin 0.95 ng/mL    Comment:        Interpretation: PCT > 0.5 ng/mL and <= 2 ng/mL: Systemic infection (sepsis) is possible, but other conditions are known to elevate PCT as well. (NOTE)       Sepsis PCT Algorithm           Lower Respiratory Tract                                      Infection PCT Algorithm    ----------------------------     ----------------------------         PCT < 0.25 ng/mL                PCT < 0.10 ng/mL          Strongly encourage             Strongly discourage   discontinuation of antibiotics    initiation of antibiotics    ----------------------------     -----------------------------       PCT 0.25 - 0.50 ng/mL            PCT 0.10 - 0.25 ng/mL               OR       >80% decrease in PCT            Discourage initiation of                                            antibiotics      Encourage discontinuation           of antibiotics    ----------------------------      -----------------------------         PCT >= 0.50 ng/mL              PCT 0.26 - 0.50 ng/mL                AND       <80% decrease in PCT             Encourage initiation of  antibiotics       Encourage continuation           of antibiotics    ----------------------------     -----------------------------        PCT >= 0.50 ng/mL                  PCT > 0.50 ng/mL               AND         increase in PCT                  Strongly encourage                                      initiation of antibiotics    Strongly encourage escalation           of antibiotics                                     -----------------------------                                           PCT <= 0.25 ng/mL                                                 OR                                        > 80% decrease in PCT                                      Discontinue / Do not initiate                                             antibiotics  Performed at [facility], [address] [place], [place] [address]   Folate     Status: Abnormal   Collection Time: 08/12/24  6:58 AM  Result Value Ref Range   Folate 4.1 (L) >5.9 ng/mL    Comment: Performed at [facility], [address] [place], [place] [address]  Protime-INR     Status: Abnormal   Collection Time: 08/12/24  6:59 AM  Result Value Ref Range   Prothrombin Time 31.1 (H) 11.4 - 15.2 seconds   INR 2.8 (H) 0.8 - 1.2    Comment: (NOTE) INR goal varies based on device and disease states. Performed at [facility], [address] [place], [place] [address]   D-dimer, quantitative     Status: Abnormal   Collection Time: 08/12/24  6:59 AM  Result Value Ref Range   D-Dimer, Quant 3.88 (H) 0.00 - 0.50 ug/mL-FEU    Comment: (NOTE) At the manufacturer cut-off value of 0.5 g/mL  FEU, this assay has a negative predictive value of 95-100%.This assay is intended for use in conjunction with a  clinical pretest probability (PTP) assessment model to exclude pulmonary embolism (PE) and deep venous thrombosis (DVT) in outpatients suspected of PE or DVT. Results should be correlated with clinical presentation. Performed at [facility], [address], [place], [place] [address]   Troponin I (High Sensitivity)     Status: None   Collection Time: 08/12/24  9:23 AM  Result Value Ref Range   Troponin I (High Sensitivity) 11 <18 ng/L    Comment: (NOTE) Elevated high sensitivity troponin I (hsTnI) values and significant  changes across serial measurements may suggest ACS but many other  chronic and acute conditions are known to elevate hsTnI results.  Refer to the Links section for chest pain algorithms and additional  guidance. Performed at [facility], [address], [place], [place] [address]   HIV Antibody (routine testing w rflx)     Status: None   Collection Time: 08/12/24 11:00 AM  Result Value Ref Range   HIV Screen 4th Generation wRfx Non Reactive Non Reactive    Comment: Performed at [facility], [address] [place], [place] [address]  Vitamin B12     Status: None   Collection Time: 08/12/24  1:09 PM  Result Value Ref Range   Vitamin B-12 185 180 - 914 pg/mL    Comment: (NOTE) This assay is not validated for testing neonatal or myeloproliferative syndrome specimens for Vitamin B12 levels. Performed at [facility], [address], [place], [place] [address]   Culture, blood (Routine X 2) w Reflex to ID Panel     Status: None (Preliminary result)   Collection Time: 08/12/24  1:12 PM   Specimen: BLOOD LEFT ARM  Result Value Ref Range   Specimen Description BLOOD LEFT ARM    Special Requests      BOTTLES DRAWN AEROBIC AND ANAEROBIC Blood Culture results may not be optimal due to an inadequate volume of blood received in culture bottles Performed at [facility], [address] [place], [place] [address]    Culture PENDING     Report Status PENDING     MICRO: reviewed IMAGING: ECHOCARDIOGRAM COMPLETE Result Date: 08/12/2024    ECHOCARDIOGRAM REPORT   Patient Name:   ARIHANNA ESTABROOK Date of Exam: 08/12/2024 Medical Rec #:  [id number]      Height:       64.0 in Accession #:    [id number]     Weight:       581.0 lb Date of Birth:  1976-05-02      BSA:          3.078 m Patient Age:    48 years       BP:           131/73 mmHg Patient Gender: F              HR:           104 bpm. Exam Location:  Inpatient Procedure: 2D Echo, Cardiac Doppler, Color Doppler and Intracardiac            Opacification Agent (Both Spectral and Color Flow Doppler were            utilized during procedure). Indications:    Syncope  History:        Patient has prior history of Echocardiogram examinations, most  recent 04/17/2022. Risk Factors:Hypertension.  Sonographer:    Therisa Crouch Referring Phys: [id number] ELSIE KATHEE SAVANNAH  Sonographer Comments: Image acquisition challenging due to patient body habitus. IMPRESSIONS  1. Left ventricular ejection fraction, by estimation, is 60 to 65%. The left ventricle has normal function. The left ventricle has no regional wall motion abnormalities. Left ventricular diastolic parameters are consistent with Grade I diastolic dysfunction (impaired relaxation).  2. Right ventricular systolic function is normal. The right ventricular size is normal.  3. The mitral valve is normal in structure. Trivial mitral valve regurgitation. No evidence of mitral stenosis.  4. The aortic valve is tricuspid. There is mild calcification of the aortic valve. Aortic valve regurgitation is not visualized. No aortic stenosis is present.  5. The inferior vena cava is normal in size with greater than 50% respiratory variability, suggesting right atrial pressure of 3 mmHg. Conclusion(s)/Recommendation(s): On subcostal imaging there is a large cystic structure adjacent to the liver. This has been previously noted on abdoimant CT imaging.  FINDINGS  Left Ventricle: Left ventricular ejection fraction, by estimation, is 60 to 65%. The left ventricle has normal function. The left ventricle has no regional wall motion abnormalities. The left ventricular internal cavity size was normal in size. There is  no left ventricular hypertrophy. Left ventricular diastolic parameters are consistent with Grade I diastolic dysfunction (impaired relaxation). Right Ventricle: The right ventricular size is normal. No increase in right ventricular wall thickness. Right ventricular systolic function is normal. Left Atrium: Left atrial size was normal in size. Right Atrium: Right atrial size was normal in size. Pericardium: There is no evidence of pericardial effusion. Mitral Valve: The mitral valve is normal in structure. Mild mitral annular calcification. Trivial mitral valve regurgitation. No evidence of mitral valve stenosis. Tricuspid Valve: The tricuspid valve is normal in structure. Tricuspid valve regurgitation is trivial. No evidence of tricuspid stenosis. Aortic Valve: The aortic valve is tricuspid. There is mild calcification of the aortic valve. Aortic valve regurgitation is not visualized. No aortic stenosis is present. Pulmonic Valve: The pulmonic valve was normal in structure. Pulmonic valve regurgitation is not visualized. No evidence of pulmonic stenosis. Aorta: The aortic root is normal in size and structure. Venous: The inferior vena cava is normal in size with greater than 50% respiratory variability, suggesting right atrial pressure of 3 mmHg. IAS/Shunts: No atrial level shunt detected by color flow Doppler.  LEFT VENTRICLE PLAX 2D LVIDd:         4.88 cm   Diastology LVIDs:         2.97 cm   LV e' medial:    7.83 cm/s LV PW:         1.31 cm   LV E/e' medial:  11.3 LV IVS:        1.29 cm   LV e' lateral:   14.30 cm/s LVOT diam:     2.13 cm   LV E/e' lateral: 6.2 LVOT Area:     3.56 cm  RIGHT VENTRICLE             IVC RV S prime:     16.50 cm/s  IVC  diam: 1.91 cm TAPSE (M-mode): 1.7 cm LEFT ATRIUM             Index LA diam:        4.56 cm 1.48 cm/m LA Vol (A2C):   48.7 ml 15.82 ml/m LA Vol (A4C):   61.1 ml 19.85 ml/m LA Biplane Vol: 55.8 ml 18.13 ml/m  AORTA Ao Root diam: 3.19 cm Ao Asc diam:  2.81 cm MITRAL VALVE MV Area (PHT): 5.54 cm     SHUNTS MV Decel Time: 137 msec     Systemic Diam: 2.13 cm MV E velocity: 88.80 cm/s MV A velocity: 108.00 cm/s MV E/A ratio:  0.82 Toribio Fuel MD Electronically signed by Toribio Fuel MD Signature Date/Time: 08/12/2024/1:20:52 PM    Final    US  Abdomen Limited RUQ (LIVER/GB) Result Date: 08/12/2024 CLINICAL DATA:  Right upper quadrant abdominal pain. Status post ultrasound-guided drainage of a large cystic mass inferior to the liver on 04/22/2022 with no organism growth on culture of the fluid and rare white blood cells in the fluid. There was no malignancy on cytological evaluation of the fluid. The cytological evaluation did demonstrate numerous acute inflammatory cells and necrotic debris. EXAM: ULTRASOUND ABDOMEN LIMITED RIGHT UPPER QUADRANT COMPARISON:  Ultrasound-guided abdominal cyst aspiration dated 04/16/2022. Abdomen and pelvis CT dated 04/16/2022. FINDINGS: Gallbladder: Surgically absent. Common bile duct: Diameter: 5.0 mm Liver: No focal lesion identified. Within normal limits in parenchymal echogenicity. Portal vein is patent on color Doppler imaging with normal direction of blood flow towards the liver. Other: There has been re-accumulation of fluid containing diffuse internal echoes in the previously aspirated large cystic mass inferior to the liver. This measures 2.6 x 2.3 x 2.3 cm. No internal blood flow was seen with color Doppler. IMPRESSION: 1. Re-accumulation of fluid containing diffuse internal echoes in the previously aspirated large cystic mass inferior to the liver. This measures 2.6 x 2.3 x 2.3 cm. This is compatible with a recurrent complicated cyst of unknown origin based on the  previous cytology and culture results. 2. Status post cholecystectomy. 3. Otherwise, normal examination. Electronically Signed   By: Elspeth Bathe M.D.   On: 08/12/2024 10:44   DG Chest Portable 1 View Result Date: 08/12/2024 CLINICAL DATA:  Chest pain. Tachycardia. History of pulmonary embolism. EXAM: PORTABLE CHEST - 1 VIEW COMPARISON:  04/07/2022 FINDINGS: Cardiomediastinal silhouette and pulmonary vasculature are within normal limits. RIGHT basilar opacity may be atelectasis or pneumonia. Lungs are otherwise clear. IMPRESSION: RIGHT basilar opacity may be due to atelectasis or pneumonia. Electronically Signed   By: Aliene Lloyd M.D.   On: 08/12/2024 09:24    HISTORICAL MICRO/IMAGING  Assessment/Plan:  48yo F with recurrent hepatic cyst concerning for abscess.  Recommend to start amp/sub. Can stop antifungals for now  Discuss plan for IR drain given size of hepatic cyst.  Check sed rate and crp  Leukocytosis anticipated to improve  Will avoid azoles given history of supratherapeutic inr

## 2024-08-12 NOTE — ED Provider Notes (Signed)
 [place] EMERGENCY DEPARTMENT AT [facility] Provider Note   CSN: [id number] Arrival date & time: 08/12/24  9540     Patient presents with: multiple complaints    Susan Warren is a 48 y.o. female presents today for palpitations and intermittent chest pain x 1 day.  Patient also reports shortness of breath with exertion and fatigue.  Patient has had right sided flank pain wrapping around to the front for 6 days and nausea.  Patient denies history of kidney stones, dysuria, hematuria, fever, chills, vomiting, diarrhea, any other complaints at this time.   HPI     Prior to Admission medications   Medication Sig Start Date End Date Taking? Authorizing Provider  acetaminophen  (TYLENOL ) 325 MG tablet Take 2 tablets (650 mg total) by mouth every 6 (six) hours as needed for mild pain (or Fever >/= 101). 04/11/22   Elgergawy, Brayton RAMAN, MD  amLODipine  (NORVASC ) 10 MG tablet Take 1 tablet (10 mg total) by mouth daily. 01/28/23   Brien Belvie BRAVO, MD  cyanocobalamin  ([employer] VITAMIN B12) 1000 MCG tablet Take 1 tablet (1,000 mcg total) by mouth daily. 01/28/23   Brien Belvie BRAVO, MD  sertraline  (ZOLOFT ) 50 MG tablet Take 1 tablet (50 mg total) by mouth daily. 01/28/23   Brien Belvie BRAVO, MD  warfarin (COUMADIN ) 5 MG tablet Take 1 tablet (5mg  total) on Mondays, Wednesdays, and Fridays with 1/2 tablet (2.5mg ) all other days. 06/01/24   Brien Belvie BRAVO, MD    Allergies: Adhesive [tape]    Review of Systems  Cardiovascular:  Positive for chest pain and palpitations.  Genitourinary:  Positive for flank pain.    Updated Vital Signs BP 107/76   Pulse (!) 121   Temp 99.7 F (37.6 C) (Oral)   Resp 20   Wt (!) 263.5 kg   SpO2 95%   BMI 99.73 kg/m   Physical Exam Vitals and nursing note reviewed.  Constitutional:      General: She is not in acute distress.    Appearance: She is well-developed. She is obese. She is not toxic-appearing.  HENT:     Head: Normocephalic and atraumatic.      Right Ear: External ear normal.     Left Ear: External ear normal.  Eyes:     Extraocular Movements: Extraocular movements intact.     Conjunctiva/sclera: Conjunctivae normal.  Cardiovascular:     Rate and Rhythm: Regular rhythm. Tachycardia present.  Pulmonary:     Effort: Pulmonary effort is normal. No respiratory distress.  Abdominal:     Palpations: Abdomen is soft.     Tenderness: There is no abdominal tenderness.     Comments: Patient unable to be rolled secondary to body habitus  Musculoskeletal:     Cervical back: Neck supple.  Skin:    General: Skin is warm and dry.     Capillary Refill: Capillary refill takes less than 2 seconds.  Neurological:     General: No focal deficit present.     Mental Status: She is alert and oriented to person, place, and time.  Psychiatric:        Mood and Affect: Mood normal.     (all labs ordered are listed, but only abnormal results are displayed) Labs Reviewed  COMPREHENSIVE METABOLIC PANEL WITH GFR  LIPASE, BLOOD  HCG, SERUM, QUALITATIVE  CBC WITH DIFFERENTIAL/PLATELET  LACTIC ACID, PLASMA  LACTIC ACID, PLASMA  URINALYSIS, W/ REFLEX TO CULTURE (INFECTION SUSPECTED)  PROTIME-INR  D-DIMER, QUANTITATIVE  TROPONIN I (  HIGH SENSITIVITY)    EKG: None  Radiology: No results found.   Procedures   Medications Ordered in the ED  lactated ringers  bolus 1,000 mL (has no administration in time range)    Clinical Course as of 08/12/24 0629  Thu Aug 12, 2024  0625 S- too large for ct, h/o PE, on warfarin Palpitation, cp, rt flank pain, smells like UTI Assume pe if subtherapeutic.  [RC]    Clinical Course User Index [RC] Sharyne Darina RAMAN, MD                                 Medical Decision Making  This patient presents to the ED for concern of chest pain, flank pain differential diagnosis includes arrhythmia, STEMI, NSTEMI, kidney stone, UTI, pyelonephritis, PE,   6:29 AM Care of Susan Warren transferred to Dr.  Darina Sharyne at the end of my shift as the patient will require reassessment once labs/imaging have resulted. Patient presentation, ED course, and plan of care discussed with review of all pertinent labs and imaging. Please see his/her note for further details regarding further ED course and disposition. Plan at time of handoff is labs which will determine disposition. This may be altered or completely changed at the discretion of the oncoming team pending results of further workup.      Final diagnoses:  None    ED Discharge Orders     None          Susan Warren 08/12/24 9370    Susan Lonni PARAS, MD 08/12/24 [phone]

## 2024-08-12 NOTE — Progress Notes (Signed)
 PHARMACY - ANTICOAGULATION CONSULT NOTE  Pharmacy Consult for inpatient warfarin management  Indication: chest pain/ACS; PMH of PE/DVT on chronic warfarin  therapy as an outpatient.   Allergies  Allergen Reactions   Adhesive [Tape] Anaphylaxis and Swelling    *Adhesive Spray*     Patient Measurements: Weight: (!) 263.5 kg (581 lb)  Vital Signs: Temp: 99.7 F (37.6 C) (08/21 0527) Temp Source: Oral (08/21 0527) BP: 127/68 (08/21 0900) Pulse Rate: 116 (08/21 0900)  Labs: Recent Labs    08/12/24 0658 08/12/24 0659  HGB 9.7*  --   HCT 33.4*  --   PLT 432*  --   LABPROT  --  31.1*  INR  --  2.8*  CREATININE 1.10*  --   TROPONINIHS 10  --     CrCl cannot be calculated (Unknown ideal weight.).   Medical History: Past Medical History:  Diagnosis Date   Abdominal pain due to right abdominal and pelvic cystic mass 04/16/2022   Allergy    Anxiety    Candidemia (HCC) 04/18/2022   Depression    DVT (deep venous thrombosis) (HCC)    GERD (gastroesophageal reflux disease)    Hepatic cyst    Hypertension    Lymphedema of both lower extremities    Migraines    Morbid obesity with BMI of 70 and over, adult [facility])    PE (pulmonary thromboembolism) (HCC) 03/2019   Pneumonia 04/09/2022   Sleep apnea     Medications:  Scheduled:   Assessment: Prior to admission dosing of warfarin as follows:: Warfarin 5 mg MWF, 2.5 mg on other days (last dose Wed 8/20 ~1800 hours documented in review of PTA medication history.) INR currently 2.8, in goal of therapy range (2.0 - 3.0). Hgb 9.7 gm/dL, PLTC 567 K/uL.   Goal of Therapy:  INR 2-3 Monitor platelets by anticoagulation protocol: Yes   Plan:  Continue home dosing:  5 mg MWF, 2.5 mg on other days, follow INR daily and adjust as required to continue to achieve therapeutic INR response of 2.0 -3.0.   Lynwood KATHEE Lites, PharmD, CPP Clinical Pharmacist Practitioner 08/12/2024,9:25 AM

## 2024-08-12 NOTE — Progress Notes (Signed)
 Pharmacy Antibiotic Note  Susan Warren is a 48 y.o. female admitted on 08/12/2024 with intra-abdominal infection.  Pharmacy has been consulted for Zosyn  dosing.  Plan: Zosyn  3.375g IV q8h (4 hour infusion). Continue to monitor renal function Continue to monitor Blood Cultures  Height: 5' 4 (162.6 cm) Weight: (!) 263.5 kg (581 lb) IBW/kg (Calculated) : 54.7  Temp (24hrs), Avg:99.3 F (37.4 C), Min:98.9 F (37.2 C), Max:99.7 F (37.6 C)  Recent Labs  Lab 08/12/24 0657 08/12/24 0658  WBC  --  14.2*  CREATININE  --  1.10*  LATICACIDVEN 2.0*  --     Estimated Creatinine Clearance: 133.5 mL/min (A) (by C-G formula based on SCr of 1.1 mg/dL (H)).    Allergies  Allergen Reactions   Adhesive [Tape] Anaphylaxis and Swelling    *Adhesive Spray*     Antimicrobials this admission: PTZ 3.375 q6 >>    Microbiology results: 08/20 BCx: needs to be collected  Thank you for allowing pharmacy to be a part of this patient's care.  R. Samual Satterfield, PharmD PGY-1 Acute Care Pharmacy Resident [facility] 08/12/2024 1:15 PM

## 2024-08-12 NOTE — ED Notes (Signed)
 Patient transported to Ultrasound

## 2024-08-12 NOTE — H&P (Cosign Needed Addendum)
 Date: 08/12/2024               Patient Name:  Susan Warren MRN: [id number]  DOB: 22-Dec-1976 Age / Sex: 48 y.o., female   PCP: Brien Belvie BRAVO, MD         Medical Service: Internal Medicine Teaching Service         Attending Physician: Dr. Francesco Elsie NOVAK, MD      First Contact: Letha Cheadle, MD     Pager:  [phone]  Second Contact: Dr. Fairy Pool, DO   Pager:  [phone]       After Hours  (After 5pm / First Contact Pager: [phone]  weekends / holidays): Second Contact Pager: [phone]   SUBJECTIVE   Chief Complaint: SOB, palpitations, RUQ abd pain  History of Present Illness: Susan Warren is a 48 y.o. female with PMH of HTN, Hx of PE and DVT on warfarin, morbid obesity, hx of right complex abdominal cyst and candidemia 2023 .   Presents to the ED for multiple concerns.  Reports right upper quadrant abdominal pain that is sharp and intermittent for the past 3 days with associated nausea.  Denies any vomiting or diarrhea.  States prior symptoms in the past but noted cholecystectomy in 1999.  She denies any dysuria, hematuria or discharge.  Had a bowel movement that reported as normal last night.  She also endorses palpitations and shortness of breath started last night.  Denies any chest pain and reports only heart racing at this time.  Denies any fever but noted temp of 99.  No recent sick contacts.  Was recently treated for sinus infection with Augmentin .  However her symptoms have resolved.  Denies any cough, congestion.  Reports history of PE and DVT but reports adherence to her warfarin with last dose on Wednesday night 8/20.  Denies any lower extremity pain, redness or any acute changes.  Denies any recent surgery, travels or injuries.  Patient reports ambulating and active at home prior to all of this.  ED Course: Vitals were BP 107/76, HR 121, temp 99.7, saturating 95% on room air Labs significant for creatinine 1.1, troponin 10, lactic acid 2.0, WBC 14.2 Imaging  unable to obtain CT A/P or CTA PE due to body habitus Received 1 L LR Consulted IMTS for admission  Meds:  -tylenol  as needed -Augmentin  875 x 5 days  -Warfarin 5 mg MWF, 2.5 mg on other days (last dose Wed 8/20) Current Meds  Medication Sig   acetaminophen  (TYLENOL ) 325 MG tablet Take 2 tablets (650 mg total) by mouth every 6 (six) hours as needed for mild pain (or Fever >/= 101).   amoxicillin -clavulanate (AUGMENTIN ) 875-125 MG tablet Take 1 tablet by mouth every 12 (twelve) hours.   cyanocobalamin  ([employer] VITAMIN B12) 1000 MCG tablet Take 1 tablet (1,000 mcg total) by mouth daily.   warfarin (COUMADIN ) 5 MG tablet Take 1 tablet (5mg  total) on Mondays, Wednesdays, and Fridays with 1/2 tablet (2.5mg ) all other days.    Past Medical History -HTN -Large right abdominal cyst status post drainage -Hx of candidemia -Hx of R DVT & PE on warfarin  -Morbid obesity with BMI 99.73 Past Medical History:  Diagnosis Date   Abdominal pain due to right abdominal and pelvic cystic mass 04/16/2022   Allergy    Anxiety    Candidemia (HCC) 04/18/2022   Depression    DVT (deep venous thrombosis) (HCC)    GERD (gastroesophageal reflux disease)    Hepatic cyst  Hypertension    Lymphedema of both lower extremities    Migraines    Morbid obesity with BMI of 70 and over, adult [facility])    PE (pulmonary thromboembolism) (HCC) 03/2019   Pneumonia 04/09/2022   Sleep apnea     Past Surgical History Past Surgical History:  Procedure Laterality Date   CHOLECYSTECTOMY     IR INFUSION THROMBOL ARTERIAL INITIAL (MS)  03/29/2019   IR INFUSION THROMBOL ARTERIAL INITIAL (MS)  03/29/2019   IR THROMB F/U EVAL ART/VEN FINAL DAY (MS)  03/30/2019   IR US  GUIDE VASC ACCESS RIGHT  03/29/2019   TEE WITHOUT CARDIOVERSION N/A 04/23/2022   Procedure: TRANSESOPHAGEAL ECHOCARDIOGRAM (TEE);  Surgeon: Francyne Headland, MD;  Location: [facility] ENDOSCOPY;  Service: Cardiovascular;  Laterality: N/A;    Social:  Lives With: son Occupation:  Training and development officer  Support: family  Level of Function: independent with ADLs  PCP: Brien Belvie BRAVO, MD Substances: -Tobacco: denies  -Alcohol: denies -Recreational Drug: denies  Family History:  Family History  Problem Relation Age of Onset   Hypertension Mother    Thyroid disease Mother    Hypertension Maternal Grandmother    Diabetes Maternal Grandmother    Breast cancer Maternal Grandmother    Stroke Maternal Grandmother    Heart attack Neg Hx     Allergies: Allergies as of 08/12/2024 - Review Complete 08/12/2024  Allergen Reaction Noted   Adhesive [tape] Anaphylaxis and Swelling 03/19/2016    Review of Systems: A complete ROS was negative except as per HPI.   OBJECTIVE:   Physical Exam: Blood pressure 137/73, pulse (!) 115, temperature 99.7 F (37.6 C), temperature source Oral, resp. rate 18, weight (!) 263.5 kg, SpO2 99%.  Constitutional: alert, laying in bed, in no acute distress HENT: normocephalic atraumatic, mucous membranes moist Cardiovascular: tachycardia, normal rhythm Pulmonary/Chest: normal work of breathing on room air, difficulty auscultating breath sounds due to body habitus but breath sounds bilaterally  Abdominal: bowel sounds present, soft, distended, TTP of RUQ, no guarding or rebound MSK: chronic lymphedema of b/l LE Neurological: alert & oriented x 3 Skin: chronic lymphedema of b/l LE  Labs: CBC    Component Value Date/Time   WBC 14.2 (H) 08/12/2024 0658   RBC 4.01 08/12/2024 0658   HGB 9.7 (L) 08/12/2024 0658   HGB 11.6 05/06/2022 1123   HCT 33.4 (L) 08/12/2024 0658   HCT 36.1 05/06/2022 1123   PLT 432 (H) 08/12/2024 0658   PLT 609 (H) 05/06/2022 1123   MCV 83.3 08/12/2024 0658   MCV 82 05/06/2022 1123   MCH 24.2 (L) 08/12/2024 0658   MCHC 29.0 (L) 08/12/2024 0658   RDW 16.7 (H) 08/12/2024 0658   RDW 16.3 (H) 05/06/2022 1123   LYMPHSABS 1.2 08/12/2024 0658   LYMPHSABS 1.8 05/06/2022 1123   MONOABS 1.2 (H) 08/12/2024 0658   EOSABS 0.1  08/12/2024 0658   EOSABS 0.3 05/06/2022 1123   BASOSABS 0.0 08/12/2024 0658   BASOSABS 0.1 05/06/2022 1123     CMP     Component Value Date/Time   NA 137 08/12/2024 0658   NA 139 05/06/2022 1123   K 3.9 08/12/2024 0658   CL 100 08/12/2024 0658   CO2 21 (L) 08/12/2024 0658   GLUCOSE 179 (H) 08/12/2024 0658   BUN 12 08/12/2024 0658   BUN 13 05/06/2022 1123   CREATININE 1.10 (H) 08/12/2024 0658   CREATININE 0.74 05/09/2016 1327   CALCIUM 8.3 (L) 08/12/2024 0658   PROT 7.8 08/12/2024 0658   PROT  8.2 05/06/2022 1123   ALBUMIN 2.2 (L) 08/12/2024 0658   ALBUMIN 3.5 (L) 05/06/2022 1123   AST 16 08/12/2024 0658   ALT 10 08/12/2024 0658   ALKPHOS 103 08/12/2024 0658   BILITOT 1.2 08/12/2024 0658   BILITOT 0.5 05/06/2022 1123   GFRNONAA >60 08/12/2024 0658   GFRAA 115 04/15/2019 1513    Imaging:  ECHO TEE    TRANSESOPHOGEAL ECHO REPORT       Patient Name:   CASSIUS LITTIE MOLT Date of Exam: 04/23/2022 Medical Rec #:  [id number]      Height:       64.0 in Accession #:    [id number]     Weight:       480.0 lb Date of Birth:  [birth year]      BSA:          2.838 m Patient Age:    46 years       BP:           136/77 mmHg Patient Gender: F              HR:           86 bpm. Exam Location:  Inpatient  Procedure: Transesophageal Echo  Indications:     Bacteremia   History:         Patient has prior history of Echocardiogram examinations, most                  recent 04/19/2022. Risk Factors:Hypertension.   Sonographer:     Tinnie Barefoot RDCS Referring Phys:  [id number] BRUNO EMERSON CAMPI Diagnosing Phys: Jerel Balding MD    Sonographer Comments: Patient is morbidly obese.  PROCEDURE: After discussion of the risks and benefits of a TEE, an informed consent was obtained from the patient. The transesophogeal probe was passed without difficulty through the esophogus of the patient. Imaged were obtained with the patient in a  supine position. Sedation performed by different physician.  The patient was monitored while under deep sedation. Anesthestetic sedation was provided intravenously by Anesthesiology: 137mg  of Propofol . The patient's vital signs; including heart rate,  blood pressure, and oxygen  saturation; remained stable throughout the procedure. The patient developed no complications during the procedure.  IMPRESSIONS   1. Left ventricular ejection fraction, by estimation, is 60 to 65%. The left ventricle has normal function. The left ventricle has no regional wall motion abnormalities.  2. Right ventricular systolic function is normal. The right ventricular size is normal.  3. No left atrial/left atrial appendage thrombus was detected.  4. The mitral valve is normal in structure. No evidence of mitral valve regurgitation. No evidence of mitral stenosis.  5. The aortic valve is tricuspid. Aortic valve regurgitation is not visualized. No aortic stenosis is present.  6. Mildly dilated pulmonary artery.  7. The inferior vena cava is normal in size with greater than 50% respiratory variability, suggesting right atrial pressure of 3 mmHg.  Conclusion(s)/Recommendation(s): Normal biventricular function without evidence of hemodynamically significant valvular heart disease. No evidence of vegetation/infective endocarditis on this transesophageael echocardiogram.  FINDINGS  Left Ventricle: Left ventricular ejection fraction, by estimation, is 60 to 65%. The left ventricle has normal function. The left ventricle has no regional wall motion abnormalities. The left ventricular internal cavity size was normal in size. There is  no left ventricular hypertrophy.  Right Ventricle: The right ventricular size is normal. No increase in right ventricular wall thickness. Right ventricular systolic function is normal.  Left Atrium: Left  atrial size was normal in size. No left atrial/left atrial appendage thrombus was detected.  Right Atrium: Right atrial size was normal in  size.  Pericardium: There is no evidence of pericardial effusion.  Mitral Valve: The mitral valve is normal in structure. No evidence of mitral valve regurgitation. No evidence of mitral valve stenosis.  Tricuspid Valve: The tricuspid valve is normal in structure. Tricuspid valve regurgitation is mild . No evidence of tricuspid stenosis.  Aortic Valve: The aortic valve is tricuspid. Aortic valve regurgitation is not visualized. No aortic stenosis is present.  Pulmonic Valve: The pulmonic valve was normal in structure. Pulmonic valve regurgitation is not visualized. No evidence of pulmonic stenosis.  Aorta: The aortic root, ascending aorta, aortic arch and descending aorta are all structurally normal, with no evidence of dilitation or obstruction.  Pulmonary Artery: The pulmonary artery is mildly dilated.  Venous: The inferior vena cava is normal in size with greater than 50% respiratory variability, suggesting right atrial pressure of 3 mmHg.  IAS/Shunts: No atrial level shunt detected by color flow Doppler.  Jerel Balding MD Electronically signed by Jerel Balding MD Signature Date/Time: 04/23/2022/2:35:01 PM      Final     EKG: personally reviewed my interpretation is sinus tachycardia, normal axis, normal intervals. Prior EKG similar.   ASSESSMENT & PLAN:   Assessment & Plan by Problem: Principal Problem:   RUQ abdominal pain Active Problems:   Hypertension   History of pulmonary embolism   Lymphedema of both lower extremities   Susan Warren is a 48 y.o. person living with a history of HTN, Hx of PE and DVT on warfarin, morbid obesity, hx of right complex abdominal cyst and candidemia 2023  who presented with SOB and RUQ abdominal pain and admitted for further evaluation on hospital day 0  #Dyspnea #Sinus tachycardia  #Hx of DVT and PE Presents with symptoms x 1 day. Has been taking warfarin as prescribed with therapeutic INR. D-dimer 3.88. Lactic 2.0. Unable to  obtain CT imaging due to body habitus. Wells score of at least 3 but she reports adherence to warfarin but dispense record only notes 12/2023. Pending CXR. Troponin initial 10. EKG with sinus tachycardia. Does have leukocytosis but no recorded fevers.  - Check PCT to consider initiation of abx  - Trend lactic acid s/p IVF - F/u CXR - Consider VQ scan if able based on body habitus - Check Echo to evaluate any strain  - Continue telemetry  - Continue warfarin with pharmacy consult (INR goal 2-3)  #RUQ abdominal pain #Hx of cholecystectomy #Hx of right abdominal abscess (2023) #Hepatic steatosis  Presents with 3 days of pain with nausea w/o v/d. Unable to obtain CT a/p due to body habitus. LFT normal but notes bili border 1.2. Normal lipase. hCG negativeUnclear if she had f/u with general surgery after abscess drainage.  - Order RUQ u/s, consult IR if new abscess and start abx - Tylenol  1000 mg Q6H PRN and oxycodone  5 mg Q4H PRN moderate/severe - Trend CMP   #Leukocytosis #Hx of candidemia  04/16/22 Bcx grew Candida parapsilosis and treated with micafungin  then fluconazole  for 14 days. TEE w/o vegetations. Repeat Bcx negative. WBC 14.2. Recent treatment of sinus infection per patient. Denies any new cough or dysuria. Denies any acute skin infections. Pending Bcx on this admission.  - Trend CBC and fever curve - F/u Bcx - F/u CXR and UA in ED  #AKI Scr 1.10 from what appears baseline of 0.6-0.8. BUN creatinine  ratio of 11. Denies any urinary symptoms of dysuria, frequency or hematuria. Denis any recent NSAID use and only been taking Tylenol . S/p IVF. Pending UA collection still.  - Trend BMP and monitor I&Os  - S/p LR bolus, gave additional bolus - F/u UA   #HTN Notes hx of this but is normotensive. No longer on antihypertensives due to insurance change for several months.   #Chronic Anemia, normocytic  #Hx of B12 deficiency  Hgb 9.7, appears stable. Baseline around 10-11. Denies any  acute signs of bleeding while on Warfarin. Last B12 low 180. Not on supplementation per patient.   - Trend CBC and signs of bleed - Check B12 and folate    Diet: Heart Healthy VTE ppx: Warfarin IVF: LR,Bolus Abx: None  Code Status: Full Surrogate Decision Maker: Reena (relationship: mother)  Prior to Admission Living Arrangement: Home, living with son Anticipated Discharge Location: Home Barriers to Discharge: medical stability  Dispo: Admit patient to Observation with expected length of stay less than 2 midnights.  Signed: Elicia Sharper, DO Internal Medicine Resident PGY-3 08/12/2024, 8:29 AM   Please contact IM Residency On-Call Pager at: [phone] or [phone].

## 2024-08-13 ENCOUNTER — Other Ambulatory Visit (HOSPITAL_COMMUNITY): Payer: Self-pay

## 2024-08-13 ENCOUNTER — Inpatient Hospital Stay (HOSPITAL_COMMUNITY)

## 2024-08-13 DIAGNOSIS — I472 Ventricular tachycardia, unspecified: Secondary | ICD-10-CM | POA: Diagnosis present

## 2024-08-13 DIAGNOSIS — K7689 Other specified diseases of liver: Secondary | ICD-10-CM | POA: Diagnosis present

## 2024-08-13 DIAGNOSIS — Z86718 Personal history of other venous thrombosis and embolism: Secondary | ICD-10-CM | POA: Diagnosis not present

## 2024-08-13 DIAGNOSIS — Z8249 Family history of ischemic heart disease and other diseases of the circulatory system: Secondary | ICD-10-CM | POA: Diagnosis not present

## 2024-08-13 DIAGNOSIS — Z7901 Long term (current) use of anticoagulants: Secondary | ICD-10-CM | POA: Diagnosis not present

## 2024-08-13 DIAGNOSIS — K651 Peritoneal abscess: Secondary | ICD-10-CM | POA: Diagnosis present

## 2024-08-13 DIAGNOSIS — I10 Essential (primary) hypertension: Secondary | ICD-10-CM | POA: Diagnosis present

## 2024-08-13 DIAGNOSIS — E119 Type 2 diabetes mellitus without complications: Secondary | ICD-10-CM | POA: Diagnosis present

## 2024-08-13 DIAGNOSIS — Z79899 Other long term (current) drug therapy: Secondary | ICD-10-CM | POA: Diagnosis not present

## 2024-08-13 DIAGNOSIS — Z803 Family history of malignant neoplasm of breast: Secondary | ICD-10-CM | POA: Diagnosis not present

## 2024-08-13 DIAGNOSIS — E871 Hypo-osmolality and hyponatremia: Secondary | ICD-10-CM | POA: Diagnosis present

## 2024-08-13 DIAGNOSIS — Z6841 Body Mass Index (BMI) 40.0 and over, adult: Secondary | ICD-10-CM | POA: Diagnosis not present

## 2024-08-13 DIAGNOSIS — Z833 Family history of diabetes mellitus: Secondary | ICD-10-CM | POA: Diagnosis not present

## 2024-08-13 DIAGNOSIS — N939 Abnormal uterine and vaginal bleeding, unspecified: Secondary | ICD-10-CM | POA: Diagnosis not present

## 2024-08-13 DIAGNOSIS — N179 Acute kidney failure, unspecified: Secondary | ICD-10-CM

## 2024-08-13 DIAGNOSIS — Z86711 Personal history of pulmonary embolism: Secondary | ICD-10-CM | POA: Diagnosis not present

## 2024-08-13 DIAGNOSIS — K760 Fatty (change of) liver, not elsewhere classified: Secondary | ICD-10-CM | POA: Diagnosis present

## 2024-08-13 DIAGNOSIS — R188 Other ascites: Secondary | ICD-10-CM

## 2024-08-13 DIAGNOSIS — D649 Anemia, unspecified: Secondary | ICD-10-CM | POA: Diagnosis present

## 2024-08-13 DIAGNOSIS — D72829 Elevated white blood cell count, unspecified: Secondary | ICD-10-CM | POA: Diagnosis not present

## 2024-08-13 DIAGNOSIS — K750 Abscess of liver: Secondary | ICD-10-CM | POA: Diagnosis present

## 2024-08-13 DIAGNOSIS — Z8349 Family history of other endocrine, nutritional and metabolic diseases: Secondary | ICD-10-CM | POA: Diagnosis not present

## 2024-08-13 DIAGNOSIS — R002 Palpitations: Secondary | ICD-10-CM | POA: Diagnosis present

## 2024-08-13 DIAGNOSIS — R000 Tachycardia, unspecified: Secondary | ICD-10-CM | POA: Diagnosis not present

## 2024-08-13 DIAGNOSIS — Z1611 Resistance to penicillins: Secondary | ICD-10-CM | POA: Diagnosis present

## 2024-08-13 DIAGNOSIS — R1011 Right upper quadrant pain: Secondary | ICD-10-CM | POA: Diagnosis not present

## 2024-08-13 DIAGNOSIS — Z9049 Acquired absence of other specified parts of digestive tract: Secondary | ICD-10-CM | POA: Diagnosis not present

## 2024-08-13 DIAGNOSIS — B961 Klebsiella pneumoniae [K. pneumoniae] as the cause of diseases classified elsewhere: Secondary | ICD-10-CM | POA: Diagnosis present

## 2024-08-13 LAB — COMPREHENSIVE METABOLIC PANEL WITH GFR
ALT: 26 U/L (ref 0–44)
AST: 63 U/L — ABNORMAL HIGH (ref 15–41)
Albumin: 2 g/dL — ABNORMAL LOW (ref 3.5–5.0)
Alkaline Phosphatase: 90 U/L (ref 38–126)
Anion gap: 11 (ref 5–15)
BUN: 14 mg/dL (ref 6–20)
CO2: 24 mmol/L (ref 22–32)
Calcium: 8.5 mg/dL — ABNORMAL LOW (ref 8.9–10.3)
Chloride: 99 mmol/L (ref 98–111)
Creatinine, Ser: 1.01 mg/dL — ABNORMAL HIGH (ref 0.44–1.00)
GFR, Estimated: >60 mL/min (ref >60)
Glucose, Bld: 134 mg/dL — ABNORMAL HIGH (ref 70–99)
Potassium: 3.8 mmol/L (ref 3.5–5.1)
Sodium: 134 mmol/L — ABNORMAL LOW (ref 135–145)
Total Bilirubin: 0.9 mg/dL (ref 0.0–1.2)
Total Protein: 7.8 g/dL (ref 6.5–8.1)

## 2024-08-13 LAB — CBC
HCT: 32.3 % — ABNORMAL LOW (ref 36.0–46.0)
Hemoglobin: 9.6 g/dL — ABNORMAL LOW (ref 12.0–15.0)
MCH: 24.5 pg — ABNORMAL LOW (ref 26.0–34.0)
MCHC: 29.7 g/dL — ABNORMAL LOW (ref 30.0–36.0)
MCV: 82.4 fL (ref 80.0–100.0)
Platelets: 451 K/uL — ABNORMAL HIGH (ref 150–400)
RBC: 3.92 MIL/uL (ref 3.87–5.11)
RDW: 16.9 % — ABNORMAL HIGH (ref 11.5–15.5)
WBC: 14.1 K/uL — ABNORMAL HIGH (ref 4.0–10.5)
nRBC: 0 % (ref 0.0–0.2)

## 2024-08-13 LAB — SEDIMENTATION RATE: Sed Rate: 133 mm/h — ABNORMAL HIGH (ref 0–22)

## 2024-08-13 LAB — C-REACTIVE PROTEIN: CRP: 44.6 mg/dL — ABNORMAL HIGH (ref <1.0)

## 2024-08-13 LAB — PROTIME-INR
INR: 3.1 — ABNORMAL HIGH (ref 0.8–1.2)
Prothrombin Time: 33.1 s — ABNORMAL HIGH (ref 11.4–15.2)

## 2024-08-13 MED ORDER — PIPERACILLIN-TAZOBACTAM 3.375 G IVPB
3.3750 g | Freq: Three times a day (TID) | INTRAVENOUS | Status: DC
  Administered 2024-08-13 – 2024-08-16 (×8): 3.375 g via INTRAVENOUS
  Filled 2024-08-13 (×9): qty 50

## 2024-08-13 MED ORDER — WARFARIN SODIUM 2.5 MG PO TABS
2.5000 mg | ORAL_TABLET | Freq: Once | ORAL | Status: AC
  Administered 2024-08-13: 2.5 mg via ORAL
  Filled 2024-08-13: qty 1

## 2024-08-13 NOTE — Progress Notes (Addendum)
 [facility]    Date of Admission:  08/12/2024     ID: Cassius LITTIE Molt is a 48 y.o. female with hepatic abscess Principal Problem:   RUQ abdominal pain Active Problems:   Hypertension   History of pulmonary embolism   Lymphedema of both lower extremities   Intraabdominal fluid collection    Subjective: Feels slightly improved, less abdominal discomfort  Medications:   Warfarin - Pharmacist Dosing Inpatient   Does not apply q1600    Objective: Vital signs in last 24 hours: Temp:  [98.2 F (36.8 C)-98.7 F (37.1 C)] 98.3 F (36.8 C) (08/22 1522) Pulse Rate:  [101-108] 101 (08/22 1522) Resp:  [14-25] 14 (08/22 1522) BP: (103-135)/(52-74) 109/71 (08/22 1522) SpO2:  [96 %-97 %] 97 % (08/22 1522)  Physical Exam  Constitutional:  oriented to person, place, and time. appears well-developed and well-nourished. No distress.  HENT: [place]/AT, PERRLA, no scleral icterus Mouth/Throat: Oropharynx is clear and moist. No oropharyngeal exudate.  Cardiovascular: Normal rate, regular rhythm and normal heart sounds. Exam reveals no gallop and no friction rub.  No murmur heard.  Pulmonary/Chest: Effort normal and breath sounds normal. No respiratory distress.  has no wheezes.  Neck = supple, no nuchal rigidity Abdominal: Soft. Bowel sounds are normal.  exhibits no distension. There is no tenderness.  Lymphadenopathy: no cervical adenopathy. No axillary adenopathy Neurological: alert and oriented to person, place, and time.  Skin: Skin is warm and dry. No rash noted. No erythema.  Psychiatric: a normal mood and affect.  behavior is normal.    Lab Results Recent Labs    08/12/24 0658 08/13/24 0337  WBC 14.2* 14.1*  HGB 9.7* 9.6*  HCT 33.4* 32.3*  NA 137 134*  K 3.9 3.8  CL 100 99  CO2 21* 24  BUN 12 14  CREATININE 1.10* 1.01*   Liver Panel Recent Labs    08/12/24 0658 08/13/24 0337  PROT 7.8 7.8  ALBUMIN 2.2* 2.0*  AST 16 63*  ALT 10 26   ALKPHOS 103 90  BILITOT 1.2 0.9   Sedimentation Rate Recent Labs    08/13/24 0337  ESRSEDRATE 133*   C-Reactive Protein Recent Labs    08/13/24 0337  CRP 44.6*    Microbiology:  Studies/Results: US  Abdomen Limited RUQ (LIVER/GB) Result Date: 08/13/2024 CLINICAL DATA:  Follow-up cystic liver lesion following needle aspiration. EXAM: ULTRASOUND ABDOMEN LIMITED COMPARISON:  08/12/2024 FINDINGS: A thick-walled cystic lesion is seen involving the liver. This currently measures 17.5 x 15.0 x 17.6 cm, compared to 25.7 x 23.7 x 23.0 cm previously. IMPRESSION: Large complex thick-walled cystic lesion involving the liver shows mild decrease in size since previous study. Electronically Signed   By: Norleen DELENA Kil M.D.   On: 08/13/2024 15:47   IR US  Guide Bx Asp/Drain Result Date: 08/13/2024 INDICATION: Chronic very large cystic abdominal mass presumed to be a recurrent exophytic liver cyst EXAM: ULTRASOUND ASPIRATION AND DRAINAGE OF THE LARGE ABDOMINAL CYSTIC MASS MEDICATIONS: The patient is currently admitted to the hospital and receiving intravenous antibiotics. The antibiotics were administered within an appropriate time frame prior to the initiation of the procedure. ANESTHESIA/SEDATION: None. COMPLICATIONS: None immediate. PROCEDURE: Informed written consent was obtained from the patient after a thorough discussion of the procedural risks, benefits and alternatives. All questions were addressed. Maximal Sterile Barrier Technique was utilized including caps, mask, sterile gowns, sterile gloves, sterile drape, hand hygiene and skin antiseptic. A timeout was performed prior to the initiation of the  procedure. Previous imaging reviewed. Preliminary ultrasound performed. The large abdominal cystic mass was localized and marked percutaneous needle access in the midline. Under sterile conditions and local anesthesia, the cystic lesion was accessed with a 6 French Safe-T-Centesis needle catheter. Needle  position confirmed with ultrasound. Images obtained for documentation. Suction aspiration yielded 5 L of debris-filled brown cystic fluid. Sample sent for culture and cytology. Postprocedure imaging demonstrates significant collapse of the cystic lesion. No immediate complication. Patient tolerated the procedure well. IMPRESSION: Successful ultrasound aspiration of the large abdominal cystic mass. 5 L of debris-filled brown cystic fluid removed. Sample sent for culture and cytology. Electronically Signed   By: CHRISTELLA.  Shick M.D.   On: 08/13/2024 07:57   ECHOCARDIOGRAM COMPLETE Result Date: 08/12/2024    ECHOCARDIOGRAM REPORT   Patient Name:   REGLA FITZGIBBON Date of Exam: 08/12/2024 Medical Rec #:  [id number]      Height:       64.0 in Accession #:    [id number]     Weight:       581.0 lb Date of Birth:  [birth year]      BSA:          3.078 m Patient Age:    48 years       BP:           131/73 mmHg Patient Gender: F              HR:           104 bpm. Exam Location:  Inpatient Procedure: 2D Echo, Cardiac Doppler, Color Doppler and Intracardiac            Opacification Agent (Both Spectral and Color Flow Doppler were            utilized during procedure). Indications:    Syncope  History:        Patient has prior history of Echocardiogram examinations, most                 recent 04/17/2022. Risk Factors:Hypertension.  Sonographer:    Therisa Crouch Referring Phys: [id number] ELSIE KATHEE SAVANNAH  Sonographer Comments: Image acquisition challenging due to patient body habitus. IMPRESSIONS  1. Left ventricular ejection fraction, by estimation, is 60 to 65%. The left ventricle has normal function. The left ventricle has no regional wall motion abnormalities. Left ventricular diastolic parameters are consistent with Grade I diastolic dysfunction (impaired relaxation).  2. Right ventricular systolic function is normal. The right ventricular size is normal.  3. The mitral valve is normal in structure. Trivial mitral valve  regurgitation. No evidence of mitral stenosis.  4. The aortic valve is tricuspid. There is mild calcification of the aortic valve. Aortic valve regurgitation is not visualized. No aortic stenosis is present.  5. The inferior vena cava is normal in size with greater than 50% respiratory variability, suggesting right atrial pressure of 3 mmHg. Conclusion(s)/Recommendation(s): On subcostal imaging there is a large cystic structure adjacent to the liver. This has been previously noted on abdoimant CT imaging. FINDINGS  Left Ventricle: Left ventricular ejection fraction, by estimation, is 60 to 65%. The left ventricle has normal function. The left ventricle has no regional wall motion abnormalities. The left ventricular internal cavity size was normal in size. There is  no left ventricular hypertrophy. Left ventricular diastolic parameters are consistent with Grade I diastolic dysfunction (impaired relaxation). Right Ventricle: The right ventricular size is normal. No increase in right ventricular wall thickness. Right ventricular systolic function is normal.  Left Atrium: Left atrial size was normal in size. Right Atrium: Right atrial size was normal in size. Pericardium: There is no evidence of pericardial effusion. Mitral Valve: The mitral valve is normal in structure. Mild mitral annular calcification. Trivial mitral valve regurgitation. No evidence of mitral valve stenosis. Tricuspid Valve: The tricuspid valve is normal in structure. Tricuspid valve regurgitation is trivial. No evidence of tricuspid stenosis. Aortic Valve: The aortic valve is tricuspid. There is mild calcification of the aortic valve. Aortic valve regurgitation is not visualized. No aortic stenosis is present. Pulmonic Valve: The pulmonic valve was normal in structure. Pulmonic valve regurgitation is not visualized. No evidence of pulmonic stenosis. Aorta: The aortic root is normal in size and structure. Venous: The inferior vena cava is normal in  size with greater than 50% respiratory variability, suggesting right atrial pressure of 3 mmHg. IAS/Shunts: No atrial level shunt detected by color flow Doppler.  LEFT VENTRICLE PLAX 2D LVIDd:         4.88 cm   Diastology LVIDs:         2.97 cm   LV e' medial:    7.83 cm/s LV PW:         1.31 cm   LV E/e' medial:  11.3 LV IVS:        1.29 cm   LV e' lateral:   14.30 cm/s LVOT diam:     2.13 cm   LV E/e' lateral: 6.2 LVOT Area:     3.56 cm  RIGHT VENTRICLE             IVC RV S prime:     16.50 cm/s  IVC diam: 1.91 cm TAPSE (M-mode): 1.7 cm LEFT ATRIUM             Index LA diam:        4.56 cm 1.48 cm/m LA Vol (A2C):   48.7 ml 15.82 ml/m LA Vol (A4C):   61.1 ml 19.85 ml/m LA Biplane Vol: 55.8 ml 18.13 ml/m   AORTA Ao Root diam: 3.19 cm Ao Asc diam:  2.81 cm MITRAL VALVE MV Area (PHT): 5.54 cm     SHUNTS MV Decel Time: 137 msec     Systemic Diam: 2.13 cm MV E velocity: 88.80 cm/s MV A velocity: 108.00 cm/s MV E/A ratio:  0.82 Toribio Fuel MD Electronically signed by Toribio Fuel MD Signature Date/Time: 08/12/2024/1:20:52 PM    Final    US  Abdomen Limited RUQ (LIVER/GB) Result Date: 08/12/2024 CLINICAL DATA:  Right upper quadrant abdominal pain. Status post ultrasound-guided drainage of a large cystic mass inferior to the liver on 04/22/2022 with no organism growth on culture of the fluid and rare white blood cells in the fluid. There was no malignancy on cytological evaluation of the fluid. The cytological evaluation did demonstrate numerous acute inflammatory cells and necrotic debris. EXAM: ULTRASOUND ABDOMEN LIMITED RIGHT UPPER QUADRANT COMPARISON:  Ultrasound-guided abdominal cyst aspiration dated 04/16/2022. Abdomen and pelvis CT dated 04/16/2022. FINDINGS: Gallbladder: Surgically absent. Common bile duct: Diameter: 5.0 mm Liver: No focal lesion identified. Within normal limits in parenchymal echogenicity. Portal vein is patent on color Doppler imaging with normal direction of blood flow towards  the liver. Other: There has been re-accumulation of fluid containing diffuse internal echoes in the previously aspirated large cystic mass inferior to the liver. This measures 2.6 x 2.3 x 2.3 cm. No internal blood flow was seen with color Doppler. IMPRESSION: 1. Re-accumulation of fluid containing diffuse internal echoes in the previously  aspirated large cystic mass inferior to the liver. This measures 2.6 x 2.3 x 2.3 cm. This is compatible with a recurrent complicated cyst of unknown origin based on the previous cytology and culture results. 2. Status post cholecystectomy. 3. Otherwise, normal examination. Electronically Signed   By: Elspeth Bathe M.D.   On: 08/12/2024 10:44   DG Chest Portable 1 View Result Date: 08/12/2024 CLINICAL DATA:  Chest pain. Tachycardia. History of pulmonary embolism. EXAM: PORTABLE CHEST - 1 VIEW COMPARISON:  04/07/2022 FINDINGS: Cardiomediastinal silhouette and pulmonary vasculature are within normal limits. RIGHT basilar opacity may be atelectasis or pneumonia. Lungs are otherwise clear. IMPRESSION: RIGHT basilar opacity may be due to atelectasis or pneumonia. Electronically Signed   By: Aliene Lloyd M.D.   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 This currently measures 17.5 x 15.0 x 17.6 cm, compared to 25.7 x 23.7 x 23.0 cm  s/p 5 L aspirated by IR ; culture gram stain showing GNR, now identified as klebsiella, but still reincubating  - will change amp/sub to pipercillin tazobactam for better coverage - until identification of other pathogens and sensitivities return  - leukocytosis = should trend down, may need to consider repeat U/S if not improving to see if she is reaccumulating  Anticoagulation need, therapeutic drug monitoring= will continue to monitor INR, she did receive 1 dose of fluconazole  which hopefully won't impact her  dosing  evaluation of this patient requires complex antimicrobial therapy evaluation and counseling and isolation needs for disease transmission risk assessment and mitigation.    [facility] Pager: [phone]  08/13/2024, 8:08 PM

## 2024-08-13 NOTE — TOC Benefit Eligibility Note (Signed)
 Pharmacy Patient Advocate Encounter  Insurance verification completed.    The patient is insured through [facility]. Patient has [employer], may use a copay card, and/or apply for patient assistance if available.    Ran test claim for Eliquis 5mg  and the current 30 day co-pay is $100.  Ran test claim for Xarelto 10mg  and the current 30 day co-pay is $100.   This test claim was processed through [facility]- copay amounts may vary at other pharmacies due to pharmacy/plan contracts, or as the patient moves through the different stages of their insurance plan.

## 2024-08-13 NOTE — TOC Initial Note (Signed)
 Transition of Care (TOC) - Initial/Assessment Note  Rayfield Gobble RN, BSN Inpatient Care Management Unit 4E- RN Case Manager See Treatment Team for direct phone #   Patient Details  Name: Susan Warren MRN: [id number] Date of Birth: 10-Nov-1976  Transition of Care [facility]) CM/SW Contact:    [name] Hurst, RN Phone Number: 08/13/2024, 3:18 PM  Clinical Narrative:                 Pt from home with son, uses cane at baseline.  Msg received from therapy for lymphedema outpt clinic info.   CM provided info for outpt lymphedema clinics in local area. All clinics require an MD referral. Pt will need to f/u with PCP referral when she decided which clinic would be most convenient to get to.   No other HH or DME needs noted at this time- IP CM will continue to follow.    Expected Discharge Plan: OP Rehab Barriers to Discharge: Continued Medical Work up   Patient Goals and CMS Choice Patient states their goals for this hospitalization and ongoing recovery are:: return home with family          Expected Discharge Plan and Services   Discharge Planning Services: CM Consult   Living arrangements for the past 2 months: Single Family Home                                      Prior Living Arrangements/Services Living arrangements for the past 2 months: Single Family Home Lives with:: Adult Children Patient language and need for interpreter reviewed:: Yes Do you feel safe going back to the place where you live?: Yes      Need for Family Participation in Patient Care: Yes (Comment) Care giver support system in place?: Yes (comment) Current home services: DME (cane) Criminal Activity/Legal Involvement Pertinent to Current Situation/Hospitalization: No - Comment as needed  Activities of Daily Living      Permission Sought/Granted                  Emotional Assessment Appearance:: Appears stated age Attitude/Demeanor/Rapport: Engaged Affect (typically  observed): Accepting Orientation: : Oriented to Place, Oriented to Self, Oriented to  Time, Oriented to Situation Alcohol / Substance Use: Not Applicable Psych Involvement: No (comment)  Admission diagnosis:  Palpitations [R00.2] RUQ abdominal pain [R10.11] Atypical chest pain [R07.89] Patient Active Problem List   Diagnosis Date Noted   Intraabdominal fluid collection 08/13/2024   RUQ abdominal pain 08/12/2024   Colon cancer screening 06/17/2022   Cervical cancer screening 06/17/2022   Herpes simplex labialis 04/21/2022   Vitamin B12 deficiency 04/19/2022   Normocytic anemia 04/18/2022   Right abdominal and pelvic cystic mass 04/16/2022   Supratherapeutic INR 04/16/2022   Morbid obesity with body mass index of 70 and over in adult [facility])    Leg DVT (deep venous thromboembolism), chronic, left (HCC) 04/12/2019   Hepatic cyst 04/12/2019   Lymphedema of both lower extremities    History of pulmonary embolism 03/29/2019   Preventative health care 01/25/2014   Hypertension    Depression    GERD (gastroesophageal reflux disease)    Allergy    Migraines    Anxiety    IUD (intrauterine device) in place 03/10/2013   PCP:  Brien Belvie BRAVO, MD Pharmacy:   [phone] [address], [place] - [address] [place] [address] Phone: [phone] Fax: [phone]  [facility] [address] [place] [address] Phone: [phone] Fax: [phone]  DARRYLE LONG - [facility] [address] [place] [address] Phone: [phone] Fax: [phone]     Social Drivers of Health (SDOH) Social History: SDOH Screenings   Food Insecurity: Food Insecurity Present (12/11/2023)  Housing: Low Risk  (12/11/2023)  Transportation Needs: No Transportation Needs (12/11/2023)  Alcohol Screen: Low Risk  (12/11/2023)  Depression (PHQ2-9): Low Risk  (06/17/2022)  Recent Concern:  Depression (PHQ2-9) - Medium Risk (05/06/2022)  Financial Resource Strain: Low Risk  (12/11/2023)  Physical Activity: Insufficiently Active (12/11/2023)  Social Connections: Unknown (12/11/2023)  Stress: No Stress Concern Present (12/11/2023)  Tobacco Use: Low Risk  (12/11/2023)   SDOH Interventions:     Readmission Risk Interventions     No data to display

## 2024-08-13 NOTE — Progress Notes (Signed)
 Referring Physician(s): Dr. Letha Cheadle  Supervising Physician: Johann Sieving  Patient Status:  [facility] - In-pt  Chief Complaint: Symptomatic hepatic cyst  Subjective: Resting comfortably in bed.  Reports complete relief of her pain today.   Eating and drinking well.   Allergies: Adhesive [tape]  Medications: Prior to Admission medications   Medication Sig Start Date End Date Taking? Authorizing Provider  acetaminophen  (TYLENOL ) 325 MG tablet Take 2 tablets (650 mg total) by mouth every 6 (six) hours as needed for mild pain (or Fever >/= 101). 04/11/22  Yes Elgergawy, Brayton RAMAN, MD  amoxicillin -clavulanate (AUGMENTIN ) 875-125 MG tablet Take 1 tablet by mouth every 12 (twelve) hours. 08/08/24  Yes [provider]  cyanocobalamin  ([employer] VITAMIN B12) 1000 MCG tablet Take 1 tablet (1,000 mcg total) by mouth daily. 01/28/23  Yes Brien Belvie BRAVO, MD  warfarin (COUMADIN ) 5 MG tablet Take 1 tablet (5mg  total) on Mondays, Wednesdays, and Fridays with 1/2 tablet (2.5mg ) all other days. 06/01/24  Yes Brien Belvie BRAVO, MD     Vital Signs: BP (!) 135/52 (BP Location: Left Arm)   Pulse (!) 105   Temp 98.5 F (36.9 C) (Oral)   Resp (!) 25   Ht 5' 4 (1.626 m)   Wt (!) 581 lb (263.5 kg)   SpO2 96%   BMI 99.73 kg/m   Physical Exam NAD, alert Abdomen: procedure site intact. Dressing clean and dry.   Imaging: IR US  Guide Bx Asp/Drain Result Date: 08/13/2024 INDICATION: Chronic very large cystic abdominal mass presumed to be a recurrent exophytic liver cyst EXAM: ULTRASOUND ASPIRATION AND DRAINAGE OF THE LARGE ABDOMINAL CYSTIC MASS MEDICATIONS: The patient is currently admitted to the hospital and receiving intravenous antibiotics. The antibiotics were administered within an appropriate time frame prior to the initiation of the procedure. ANESTHESIA/SEDATION: None. COMPLICATIONS: None immediate. PROCEDURE: Informed written consent was obtained from the patient after a thorough  discussion of the procedural risks, benefits and alternatives. All questions were addressed. Maximal Sterile Barrier Technique was utilized including caps, mask, sterile gowns, sterile gloves, sterile drape, hand hygiene and skin antiseptic. A timeout was performed prior to the initiation of the procedure. Previous imaging reviewed. Preliminary ultrasound performed. The large abdominal cystic mass was localized and marked percutaneous needle access in the midline. Under sterile conditions and local anesthesia, the cystic lesion was accessed with a 6 French Safe-T-Centesis needle catheter. Needle position confirmed with ultrasound. Images obtained for documentation. Suction aspiration yielded 5 L of debris-filled brown cystic fluid. Sample sent for culture and cytology. Postprocedure imaging demonstrates significant collapse of the cystic lesion. No immediate complication. Patient tolerated the procedure well. IMPRESSION: Successful ultrasound aspiration of the large abdominal cystic mass. 5 L of debris-filled brown cystic fluid removed. Sample sent for culture and cytology. Electronically Signed   By: CHRISTELLA.  Shick M.D.   On: 08/13/2024 07:57   ECHOCARDIOGRAM COMPLETE Result Date: 08/12/2024    ECHOCARDIOGRAM REPORT   Patient Name:   Susan Warren Date of Exam: 08/12/2024 Medical Rec #:  [id number]      Height:       64.0 in Accession #:    [id number]     Weight:       581.0 lb Date of Birth:  [birth year]      BSA:          3.078 m Patient Age:    48 years       BP:  131/73 mmHg Patient Gender: F              HR:           104 bpm. Exam Location:  Inpatient Procedure: 2D Echo, Cardiac Doppler, Color Doppler and Intracardiac            Opacification Agent (Both Spectral and Color Flow Doppler were            utilized during procedure). Indications:    Syncope  History:        Patient has prior history of Echocardiogram examinations, most                 recent 04/17/2022. Risk Factors:Hypertension.  Sonographer:     Therisa Crouch Referring Phys: [id number] ELSIE KATHEE SAVANNAH  Sonographer Comments: Image acquisition challenging due to patient body habitus. IMPRESSIONS  1. Left ventricular ejection fraction, by estimation, is 60 to 65%. The left ventricle has normal function. The left ventricle has no regional wall motion abnormalities. Left ventricular diastolic parameters are consistent with Grade I diastolic dysfunction (impaired relaxation).  2. Right ventricular systolic function is normal. The right ventricular size is normal.  3. The mitral valve is normal in structure. Trivial mitral valve regurgitation. No evidence of mitral stenosis.  4. The aortic valve is tricuspid. There is mild calcification of the aortic valve. Aortic valve regurgitation is not visualized. No aortic stenosis is present.  5. The inferior vena cava is normal in size with greater than 50% respiratory variability, suggesting right atrial pressure of 3 mmHg. Conclusion(s)/Recommendation(s): On subcostal imaging there is a large cystic structure adjacent to the liver. This has been previously noted on abdoimant CT imaging. FINDINGS  Left Ventricle: Left ventricular ejection fraction, by estimation, is 60 to 65%. The left ventricle has normal function. The left ventricle has no regional wall motion abnormalities. The left ventricular internal cavity size was normal in size. There is  no left ventricular hypertrophy. Left ventricular diastolic parameters are consistent with Grade I diastolic dysfunction (impaired relaxation). Right Ventricle: The right ventricular size is normal. No increase in right ventricular wall thickness. Right ventricular systolic function is normal. Left Atrium: Left atrial size was normal in size. Right Atrium: Right atrial size was normal in size. Pericardium: There is no evidence of pericardial effusion. Mitral Valve: The mitral valve is normal in structure. Mild mitral annular calcification. Trivial mitral valve regurgitation. No  evidence of mitral valve stenosis. Tricuspid Valve: The tricuspid valve is normal in structure. Tricuspid valve regurgitation is trivial. No evidence of tricuspid stenosis. Aortic Valve: The aortic valve is tricuspid. There is mild calcification of the aortic valve. Aortic valve regurgitation is not visualized. No aortic stenosis is present. Pulmonic Valve: The pulmonic valve was normal in structure. Pulmonic valve regurgitation is not visualized. No evidence of pulmonic stenosis. Aorta: The aortic root is normal in size and structure. Venous: The inferior vena cava is normal in size with greater than 50% respiratory variability, suggesting right atrial pressure of 3 mmHg. IAS/Shunts: No atrial level shunt detected by color flow Doppler.  LEFT VENTRICLE PLAX 2D LVIDd:         4.88 cm   Diastology LVIDs:         2.97 cm   LV e' medial:    7.83 cm/s LV PW:         1.31 cm   LV E/e' medial:  11.3 LV IVS:        1.29 cm   LV  e' lateral:   14.30 cm/s LVOT diam:     2.13 cm   LV E/e' lateral: 6.2 LVOT Area:     3.56 cm  RIGHT VENTRICLE             IVC RV S prime:     16.50 cm/s  IVC diam: 1.91 cm TAPSE (M-mode): 1.7 cm LEFT ATRIUM             Index LA diam:        4.56 cm 1.48 cm/m LA Vol (A2C):   48.7 ml 15.82 ml/m LA Vol (A4C):   61.1 ml 19.85 ml/m LA Biplane Vol: 55.8 ml 18.13 ml/m   AORTA Ao Root diam: 3.19 cm Ao Asc diam:  2.81 cm MITRAL VALVE MV Area (PHT): 5.54 cm     SHUNTS MV Decel Time: 137 msec     Systemic Diam: 2.13 cm MV E velocity: 88.80 cm/s MV A velocity: 108.00 cm/s MV E/A ratio:  0.82 Toribio Fuel MD Electronically signed by Toribio Fuel MD Signature Date/Time: 08/12/2024/1:20:52 PM    Final    US  Abdomen Limited RUQ (LIVER/GB) Result Date: 08/12/2024 CLINICAL DATA:  Right upper quadrant abdominal pain. Status post ultrasound-guided drainage of a large cystic mass inferior to the liver on 04/22/2022 with no organism growth on culture of the fluid and rare white blood cells in the fluid.  There was no malignancy on cytological evaluation of the fluid. The cytological evaluation did demonstrate numerous acute inflammatory cells and necrotic debris. EXAM: ULTRASOUND ABDOMEN LIMITED RIGHT UPPER QUADRANT COMPARISON:  Ultrasound-guided abdominal cyst aspiration dated 04/16/2022. Abdomen and pelvis CT dated 04/16/2022. FINDINGS: Gallbladder: Surgically absent. Common bile duct: Diameter: 5.0 mm Liver: No focal lesion identified. Within normal limits in parenchymal echogenicity. Portal vein is patent on color Doppler imaging with normal direction of blood flow towards the liver. Other: There has been re-accumulation of fluid containing diffuse internal echoes in the previously aspirated large cystic mass inferior to the liver. This measures 2.6 x 2.3 x 2.3 cm. No internal blood flow was seen with color Doppler. IMPRESSION: 1. Re-accumulation of fluid containing diffuse internal echoes in the previously aspirated large cystic mass inferior to the liver. This measures 2.6 x 2.3 x 2.3 cm. This is compatible with a recurrent complicated cyst of unknown origin based on the previous cytology and culture results. 2. Status post cholecystectomy. 3. Otherwise, normal examination. Electronically Signed   By: Elspeth Bathe M.D.   On: 08/12/2024 10:44   DG Chest Portable 1 View Result Date: 08/12/2024 CLINICAL DATA:  Chest pain. Tachycardia. History of pulmonary embolism. EXAM: PORTABLE CHEST - 1 VIEW COMPARISON:  04/07/2022 FINDINGS: Cardiomediastinal silhouette and pulmonary vasculature are within normal limits. RIGHT basilar opacity may be atelectasis or pneumonia. Lungs are otherwise clear. IMPRESSION: RIGHT basilar opacity may be due to atelectasis or pneumonia. Electronically Signed   By: Aliene Lloyd M.D.   On: 08/12/2024 09:24    Labs:  CBC: Recent Labs    08/12/24 0658 08/13/24 0337  WBC 14.2* 14.1*  HGB 9.7* 9.6*  HCT 33.4* 32.3*  PLT 432* 451*    COAGS: Recent Labs    01/16/24 1354  06/01/24 1549 08/12/24 0659 08/13/24 0337  INR 2.2 2.2 2.8* 3.1*    BMP: Recent Labs    08/12/24 0658 08/13/24 0337  NA 137 134*  K 3.9 3.8  CL 100 99  CO2 21* 24  GLUCOSE 179* 134*  BUN 12 14  CALCIUM 8.3* 8.5*  CREATININE 1.10* 1.01*  GFRNONAA >60 >60    LIVER FUNCTION TESTS: Recent Labs    08/12/24 0658 08/13/24 0337  BILITOT 1.2 0.9  AST 16 63*  ALT 10 26  ALKPHOS 103 90  PROT 7.8 7.8  ALBUMIN 2.2* 2.0*    Assessment and Plan: Symptomatic hepatic cyst s/p aspiration by Dr. Vanice 8/21 Patient assessed at bedside this afternoon.  She reports complete relief of her pain today which was a sharp, stabbing pain yesterday.  Repeat US  obtained by primary team today shows recurrence of her fluid though she remains asymptomatic.   Discussed with Dr. Johann who suggests patient follow-up with IR if and when her fluid reaccumulation again becomes symptomatic at which time an alcohol ablation could also be considered.  Contact information for IR has been placed in her AVS.   Electronically Signed: Suraiya Dickerson Sue-Ellen Fionnuala Hemmerich, PA 08/13/2024, 1:32 PM   I spent a total of 15 Minutes at the the patient's bedside AND on the patient's hospital floor or unit, greater than 50% of which was counseling/coordinating care for hepatic cyst.

## 2024-08-13 NOTE — Progress Notes (Addendum)
 PHARMACY - ANTICOAGULATION CONSULT NOTE  Pharmacy Consult for warfarin dosing. Indication: VTE prophylaxis  Allergies  Allergen Reactions   Adhesive [Tape] Anaphylaxis and Swelling    *Adhesive Spray*     Patient Measurements: Height: 5' 4 (162.6 cm) Weight: (!) 263.5 kg (581 lb) IBW/kg (Calculated) : 54.7  Vital Signs: Temp: 98.4 F (36.9 C) (08/22 0729) Temp Source: Oral (08/22 0729) BP: 103/54 (08/22 0729) Pulse Rate: 108 (08/22 0729)  Labs: Recent Labs    08/12/24 0658 08/12/24 0659 08/12/24 0923 08/13/24 0337  HGB 9.7*  --   --  9.6*  HCT 33.4*  --   --  32.3*  PLT 432*  --   --  451*  LABPROT  --  31.1*  --  33.1*  INR  --  2.8*  --  3.1*  CREATININE 1.10*  --   --  1.01*  TROPONINIHS 10  --  11  --     Estimated Creatinine Clearance: 145.4 mL/min (A) (by C-G formula based on SCr of 1.01 mg/dL (H)).   Medical History: Past Medical History:  Diagnosis Date   Abdominal pain due to right abdominal and pelvic cystic mass 04/16/2022   Allergy    Anxiety    Candidemia (HCC) 04/18/2022   Depression    DVT (deep venous thrombosis) (HCC)    GERD (gastroesophageal reflux disease)    Hepatic cyst    Hypertension    Lymphedema of both lower extremities    Migraines    Morbid obesity with BMI of 70 and over, adult [facility])    PE (pulmonary thromboembolism) (HCC) 03/2019   Pneumonia 04/09/2022   Sleep apnea     Medications:  Scheduled:   Warfarin - Pharmacist Dosing Inpatient   Does not apply q1600    Assessment: INR as recorded above. Slight increase overnight consistent with having commenced fluconazole  yesterday, which has now been discontinued and ampicillin -sublactam having been commenced.   Goal of Therapy:  INR 2-3 Monitor platelets by anticoagulation protocol: Yes>>432 K/uL.   Plan:  Given that INR has gone greater than 3.0 after one dose of fluconazole  (now discontinued), will give only 2.5 mg warfarin today. Home dose had been 5 mg MWF; 2.5 on  all other days--but owing to the DDI fluconazole  + warfarin, a hypoprothrombinemic response overnight now dictates only 2.5 mg warfarin today. If INR commences to fall rapidly/drastically over the weekend, may consider dose re-escalation.   Lynwood KATHEE Lites, PharmD. CPP Clinical Pharmacist Practitioner 08/13/2024,8:27 AM

## 2024-08-13 NOTE — Evaluation (Signed)
 Physical Therapy Evaluation Patient Details Name: Susan Warren MRN: [id number] DOB: 1976/04/30 Today's Date: 08/13/2024  History of Present Illness  48 yo female admitted with R abdominal pain with 3 days of pain and nausea. Admitted for hepatic cyst leading to sub hepatic fluid collection. PMH HTN, hx of PE/ DVT on warfarin, obese, hx of R complex abdominal cyst, candidemia, lymphedema BLE ( untreated)  Clinical Impression  Pt is presenting below baseline level of functioning. HR up to 138 bpm with 50 ft of gait with intermittent UE support on rails in hall. Pt declines RW for home and gait. No overt loss of balance but definite need for UE support with gait due to poor balance, wide BOS and pain in the LB. Pt requires increased time at mod I for bed mobility and sit to stand without an AD. Pt demonstrates significant lymphedema in the RLE and abdomen as well as the L lower leg. Due to pt current functional status, home set up and available assistance at home recommending skilled physical therapy services 3x/week in order to address strength, balance and functional mobility to decrease risk for falls, injury and re-hospitalization.           If plan is discharge home, recommend the following: Help with stairs or ramp for entrance     Equipment Recommendations None recommended by PT     Functional Status Assessment Patient has had a recent decline in their functional status and demonstrates the ability to make significant improvements in function in a reasonable and predictable amount of time.     Precautions / Restrictions Precautions Precautions: None Recall of Precautions/Restrictions: Intact Restrictions Weight Bearing Restrictions Per Provider Order: No      Mobility  Bed Mobility Overal bed mobility: Modified Independent       General bed mobility comments: HOB elevated 30 degrees and use of rail    Transfers Overall transfer level: Modified independent Equipment used:  None     General transfer comment: pt stabilizes herself on the bed rail and sink intermittently on standing with wide BOS.    Ambulation/Gait Ambulation/Gait assistance: Contact guard assist Gait Distance (Feet): 50 Feet Assistive device: None Gait Pattern/deviations: Step-through pattern, Wide base of support, Trunk flexed, Antalgic Gait velocity: Decreased Gait velocity interpretation: <1.31 ft/sec, indicative of household ambulator   General Gait Details: intermittent use of rail on wall with flexion and R bias due to pain in the side/low back secondary to cyst. Pt HR up to 138 bpm with gait and shortness of breathe.  Stairs Stairs:  (Pt son can assist with steps with sturdy rails. Demonstrates sufficent strength to navigate stairs with some help per sit to stand and gait.)             Balance Overall balance assessment: Needs assistance Sitting-balance support: Single extremity supported Sitting balance-Leahy Scale: Normal     Standing balance support: Single extremity supported, During functional activity Standing balance-Leahy Scale: Fair Standing balance comment: intermittent need for UE support       Pertinent Vitals/Pain Pain Assessment Pain Assessment: 0-10 Pain Score: 7  Pain Location: at drainage site. Pain Descriptors / Indicators: Aching Pain Intervention(s): Monitored during session, Patient requesting pain meds-RN notified    Home Living Family/patient expects to be discharged to:: Private residence Living Arrangements: Children Available Help at Discharge: Family Type of Home: House Home Access: Stairs to enter Entrance Stairs-Rails: Right;Left;Can reach both Entrance Stairs-Number of Steps: 2   Home Layout: One level Home Equipment:  [place] - single point Additional Comments: lives with son    Prior Function Prior Level of Function : Independent/Modified Independent;Working/employed;Driving             Mobility Comments: Ind with out an  AD ADLs Comments: Pt is ind with ADL's and works from home.     Extremity/Trunk Assessment   Upper Extremity Assessment Upper Extremity Assessment: Defer to OT evaluation    Lower Extremity Assessment Lower Extremity Assessment: Overall WFL for tasks assessed    Cervical / Trunk Assessment Cervical / Trunk Assessment: Normal  Communication   Communication Communication: No apparent difficulties    Cognition Arousal: Alert Behavior During Therapy: WFL for tasks assessed/performed   PT - Cognitive impairments: No apparent impairments   Following commands: Intact       Cueing Cueing Techniques: Verbal cues     General Comments General comments (skin integrity, edema, etc.): Pt with stage III post thrombotic lymphedema throughout the RLE and R abdomen and stage III lymphedema in the L lower leg. Pt states she has never received treatment. Pt briefly educated on the lymphatic system and the possibility of decreasing girth size of the abdomen and RLE/L lower leg in order to improve ease of mobility. Pt educated on lymphedema therapy.        Assessment/Plan    PT Assessment Patient needs continued PT services  PT Problem List Cardiopulmonary status limiting activity;Decreased activity tolerance;Decreased mobility       PT Treatment Interventions DME instruction;Therapeutic activities;Gait training;Patient/family education;Therapeutic exercise;Stair training;Balance training;Functional mobility training    PT Goals (Current goals can be found in the Care Plan section)       Frequency Min 2X/week        AM-PAC PT 6 Clicks Mobility  Outcome Measure Help needed turning from your back to your side while in a flat bed without using bedrails?: None Help needed moving from lying on your back to sitting on the side of a flat bed without using bedrails?: None Help needed moving to and from a bed to a chair (including a wheelchair)?: None Help needed standing up from a  chair using your arms (e.g., wheelchair or bedside chair)?: None Help needed to walk in hospital room?: A Little Help needed climbing 3-5 steps with a railing? : A Little 6 Click Score: 22    End of Session   Activity Tolerance: Patient tolerated treatment well Patient left: in bed;with call bell/phone within reach Nurse Communication: Mobility status PT Visit Diagnosis: Other abnormalities of gait and mobility (R26.89)    Time: 1206-1252 PT Time Calculation (min) (ACUTE ONLY): 46 min   Charges:   PT Evaluation $PT Eval Low Complexity: 1 Low PT Treatments $Therapeutic Activity: 23-37 mins PT General Charges $$ ACUTE PT VISIT: 1 Visit         Dorothyann Maier, DPT, CLT  Acute Rehabilitation Services Office: [phone] (Secure chat preferred)   Dorothyann VEAR Maier 08/13/2024, 12:57 PM

## 2024-08-13 NOTE — Evaluation (Signed)
 Occupational Therapy Evaluation Patient Details Name: Susan Warren MRN: [id number] DOB: Feb 15, 1976 Today's Date: 08/13/2024   History of Present Illness   48 yo female admitted with R abdominal pain with 3 days of pain and nausea. Admitted for hepatic cyst leading to sub hepatic fluid collection. PMH HTN, hx of PE/ DVT on warfarin, obese, hx of R complex abdominal cyst, candidemia, lymphedema BLE ( untreated)     Clinical Impressions PT admitted with hepatic cyst. Pt currently with functional limitiations due to the deficits listed below (see OT problem list). Pt reports that BLE are better than they have been in weeks but needing (A) with lifting them onto bed surface. Pt reports x3 weeks of sitting due to illness and fatigued with basic house level transfers. Pt needs to stop for rest break and environemental support. Pt states wow. I didn't realize this was this hard. Ot to follow for energy conservation and AE for LB adls.  Pt will benefit from skilled OT to increase their independence and safety with adls and balance to allow discharge home.      If plan is discharge home, recommend the following:   A little help with walking and/or transfers;A little help with bathing/dressing/bathroom     Functional Status Assessment   Patient has had a recent decline in their functional status and demonstrates the ability to make significant improvements in function in a reasonable and predictable amount of time.     Equipment Recommendations   None recommended by OT     Recommendations for Other Services         Precautions/Restrictions   Precautions Precautions: None Recall of Precautions/Restrictions: Intact Restrictions Weight Bearing Restrictions Per Provider Order: No     Mobility Bed Mobility Overal bed mobility: Needs Assistance Bed Mobility: Supine to Sit, Sit to Supine     Supine to sit: Modified independent (Device/Increase time) Sit to supine: Mod  assist   General bed mobility comments: pt requires (A) to lift bil LE onto bed surface. pt wants bed completely deflated to help with transfer    Transfers Overall transfer level: Modified independent                        Balance Overall balance assessment: Needs assistance           Standing balance-Leahy Scale: Poor Standing balance comment: walking holding the rail in the hallway the entire time                           ADL either performed or assessed with clinical judgement   ADL Overall ADL's : Needs assistance/impaired Eating/Feeding: Modified independent   Grooming: Wash/dry face   Upper Body Bathing: Supervision/ safety   Lower Body Bathing: Maximal assistance Lower Body Bathing Details (indicate cue type and reason): decreased LB hygiene noted and suspect this could be closer to baseline Upper Body Dressing : Supervision/safety   Lower Body Dressing: Maximal assistance   Toilet Transfer: Supervision/safety           Functional mobility during ADLs: Supervision/safety General ADL Comments: pt with max [id number] on RA but needing rest break and O2 90>on RA. pt reports slight dizziness but resolves with restbreak     Vision Baseline Vision/History: 1 Wears glasses Patient Visual Report: No change from baseline Vision Assessment?: No apparent visual deficits Additional Comments: does not have glasses in acute presently     Perception  Praxis         Pertinent Vitals/Pain Pain Assessment Pain Assessment: 0-10 Pain Score: 2  Pain Location: sore in abdomen Pain Descriptors / Indicators: Sore Pain Intervention(s): Monitored during session, Repositioned     Extremity/Trunk Assessment Upper Extremity Assessment Upper Extremity Assessment: Overall WFL for tasks assessed   Lower Extremity Assessment Lower Extremity Assessment: Defer to PT evaluation   Cervical / Trunk Assessment Cervical / Trunk Assessment: Normal    Communication Communication Communication: No apparent difficulties   Cognition Arousal: Alert Behavior During Therapy: WFL for tasks assessed/performed Cognition: No apparent impairments                                       Cueing  General Comments      Max HR 147   Exercises     Shoulder Instructions      Home Living Family/patient expects to be discharged to:: Private residence Living Arrangements: Children Available Help at Discharge: Family Type of Home: House Home Access: Stairs to enter Secretary/administrator of Steps: 2 Entrance Stairs-Rails: Right;Left;Can reach both Home Layout: One level     Bathroom Shower/Tub: Chief Strategy Officer: Handicapped height     Home Equipment: Medical laboratory scientific officer - single point   Additional Comments: lives with son      Prior Functioning/Environment Prior Level of Function : Independent/Modified Independent;Working/employed;Driving             Mobility Comments: Ind with out an AD ADLs Comments: Pt is ind with ADL's and works from home.    OT Problem List: Decreased strength;Decreased activity tolerance;Impaired balance (sitting and/or standing);Decreased knowledge of use of DME or AE;Obesity;Cardiopulmonary status limiting activity   OT Treatment/Interventions: Self-care/ADL training;Energy conservation;DME and/or AE instruction;Therapeutic activities;Patient/family education;Balance training      OT Goals(Current goals can be found in the care plan section)   Acute Rehab OT Goals Patient Stated Goal: to get better and leave faster than 3 weeks like last time. pt does express prior hx of anxiety with hospitalization OT Goal Formulation: With patient Time For Goal Achievement: 08/27/24 Potential to Achieve Goals: Good   OT Frequency:  Min 2X/week    Co-evaluation              AM-PAC OT 6 Clicks Daily Activity     Outcome Measure Help from another person eating meals?: None Help  from another person taking care of personal grooming?: None Help from another person toileting, which includes using toliet, bedpan, or urinal?: A Little Help from another person bathing (including washing, rinsing, drying)?: A Little Help from another person to put on and taking off regular upper body clothing?: None Help from another person to put on and taking off regular lower body clothing?: A Little 6 Click Score: 21   End of Session Nurse Communication: Mobility status;Precautions  Activity Tolerance: Patient tolerated treatment well Patient left: in bed;with call bell/phone within reach  OT Visit Diagnosis: Unsteadiness on feet (R26.81);Muscle weakness (generalized) (M62.81)                Time: 8974-8948 OT Time Calculation (min): 26 min Charges:  OT General Charges $OT Visit: 1 Visit OT Evaluation $OT Eval Moderate Complexity: 1 Mod   Brynn, OTR/L  Acute Rehabilitation Services Office: [phone] .   Ely Molt 08/13/2024, 1:27 PM

## 2024-08-13 NOTE — Progress Notes (Addendum)
 HD#0 SUBJECTIVE:  Patient Summary: Susan Warren is a 48 y.o. with a pertinent PMH of PE and DVT on warfarin, complex right abdominal cyst, candidemia (2023), HTN, morbid obesity, who presented with SOB and RUQ abdominal pain and admitted for hepatic cyst leading to subhepatic fluid collection.   Overnight Events: Lactic acid elevated at 2.5 but improved to 1.5 with fluids. Slightly tachycardic up to 111 at the time which has remained consistent throughout the night. Remained normotensive and afebrile.   Interim History:  Patient reports everything feels better. Abdominal pain is improved since they drained the cyst. She is not SOB, denies cough, and maintains an appropriate appetite and bowel movements. She only reports that she feels weak, but is still improved from yesterday.    OBJECTIVE:  Vital Signs: Vitals:   08/12/24 2313 08/12/24 2315 08/13/24 0352 08/13/24 0729  BP: 125/74 125/74 120/73 (!) 103/54  Pulse:   (!) 108 (!) 108  Resp: 20 20 20 20   Temp: 98.2 F (36.8 C)  98.7 F (37.1 C) 98.4 F (36.9 C)  TempSrc: Oral  Oral Oral  SpO2:   96% 96%  Weight:      Height:       Supplemental O2: Room Air SpO2: 96 %  Filed Weights   08/12/24 0549  Weight: (!) 263.5 kg     Intake/Output Summary (Last 24 hours) at 08/13/2024 1116 Last data filed at 08/12/2024 2353 Gross per 24 hour  Intake 1597.46 ml  Output --  Net 1597.46 ml   Net IO Since Admission: 2,597.46 mL [08/13/24 1116]  Physical Exam: Constitutional: well-appearing, morbidly obese, sitting up in hospital bed in no acute distress HENT: normocephalic atraumatic, mucous membranes moist Eyes: conjunctiva non-erythematous, PERRL, no scleral icterus Neck: supple without lesions, thyroid non-enlarged and non-tender Cardiovascular: slightly tachycardic with normal rhythm, no m/r/g Pulmonary/Chest: normal work of breathing on room air, lungs clear to auscultation bilaterally Abdominal: soft, non-tender,  non-distended, bowel sounds normal; gauze covering prior aspiration site in midline upper abdomen - no erythema, warmth, or purulent drainage noted. No purpura or other signs of bruising noted at drainage site. MSK: normal bulk and tone Neurological: alert & oriented x3, 5/5 strength in bilateral upper and lower extremities, normal gait Skin: warm and dry; several excoriations on abdomen. Extremities: no cyanosis present; significant BLE edema present; peripheral pulses intact Psych: normal mood and affect, thought content normal   Patient Lines/Drains/Airways Status     Active Line/Drains/Airways     Name Placement date Placement time Site Days   Peripheral IV 08/12/24 20 G 2.5 Anterior;Right Forearm 08/12/24  0647  Forearm  1   External Urinary Catheter 04/16/22  2300  --  850            Pertinent labs and imaging:      Latest Ref Rng & Units 08/13/2024    3:37 AM 08/12/2024    6:58 AM 05/06/2022   11:23 AM  CBC  WBC 4.0 - 10.5 K/uL 14.1  14.2  8.5   Hemoglobin 12.0 - 15.0 g/dL 9.6  9.7  88.3   Hematocrit 36.0 - 46.0 % 32.3  33.4  36.1   Platelets 150 - 400 K/uL 451  432  609        Latest Ref Rng & Units 08/13/2024    3:37 AM 08/12/2024    6:58 AM 05/06/2022   11:23 AM  CMP  Glucose 70 - 99 mg/dL 865  820  861   BUN  6 - 20 mg/dL 14  12  13    Creatinine 0.44 - 1.00 mg/dL 8.98  8.89  9.17   Sodium 135 - 145 mmol/L 134  137  139   Potassium 3.5 - 5.1 mmol/L 3.8  3.9  4.5   Chloride 98 - 111 mmol/L 99  100  98   CO2 22 - 32 mmol/L 24  21  20    Calcium 8.9 - 10.3 mg/dL 8.5  8.3  9.2   Total Protein 6.5 - 8.1 g/dL 7.8  7.8  8.2   Total Bilirubin 0.0 - 1.2 mg/dL 0.9  1.2  0.5   Alkaline Phos 38 - 126 U/L 90  103  109   AST 15 - 41 U/L 63  16  18   ALT 0 - 44 U/L 26  10  14      IR US  Guide Bx Asp/Drain Result Date: 08/13/2024 INDICATION: Chronic very large cystic abdominal mass presumed to be a recurrent exophytic liver cyst EXAM: ULTRASOUND ASPIRATION AND DRAINAGE OF  THE LARGE ABDOMINAL CYSTIC MASS MEDICATIONS: The patient is currently admitted to the hospital and receiving intravenous antibiotics. The antibiotics were administered within an appropriate time frame prior to the initiation of the procedure. ANESTHESIA/SEDATION: None. COMPLICATIONS: None immediate. PROCEDURE: Informed written consent was obtained from the patient after a thorough discussion of the procedural risks, benefits and alternatives. All questions were addressed. Maximal Sterile Barrier Technique was utilized including caps, mask, sterile gowns, sterile gloves, sterile drape, hand hygiene and skin antiseptic. A timeout was performed prior to the initiation of the procedure. Previous imaging reviewed. Preliminary ultrasound performed. The large abdominal cystic mass was localized and marked percutaneous needle access in the midline. Under sterile conditions and local anesthesia, the cystic lesion was accessed with a 6 French Safe-T-Centesis needle catheter. Needle position confirmed with ultrasound. Images obtained for documentation. Suction aspiration yielded 5 L of debris-filled brown cystic fluid. Sample sent for culture and cytology. Postprocedure imaging demonstrates significant collapse of the cystic lesion. No immediate complication. Patient tolerated the procedure well. IMPRESSION: Successful ultrasound aspiration of the large abdominal cystic mass. 5 L of debris-filled brown cystic fluid removed. Sample sent for culture and cytology. Electronically Signed   By: CHRISTELLA.  Shick M.D.   On: 08/13/2024 07:57   ECHOCARDIOGRAM COMPLETE Result Date: 08/12/2024    ECHOCARDIOGRAM REPORT   Patient Name:   Susan Warren Date of Exam: 08/12/2024 Medical Rec #:  [id number]      Height:       64.0 in Accession #:    [id number]     Weight:       581.0 lb Date of Birth:  20-Mar-1976      BSA:          3.078 m Patient Age:    48 years       BP:           131/73 mmHg Patient Gender: F              HR:           104 bpm.  Exam Location:  Inpatient Procedure: 2D Echo, Cardiac Doppler, Color Doppler and Intracardiac            Opacification Agent (Both Spectral and Color Flow Doppler were            utilized during procedure). Indications:    Syncope  History:        Patient has prior history of Echocardiogram examinations,  most                 recent 04/17/2022. Risk Factors:Hypertension.  Sonographer:    Therisa Crouch Referring Phys: [id number] ELSIE KATHEE SAVANNAH  Sonographer Comments: Image acquisition challenging due to patient body habitus. IMPRESSIONS  1. Left ventricular ejection fraction, by estimation, is 60 to 65%. The left ventricle has normal function. The left ventricle has no regional wall motion abnormalities. Left ventricular diastolic parameters are consistent with Grade I diastolic dysfunction (impaired relaxation).  2. Right ventricular systolic function is normal. The right ventricular size is normal.  3. The mitral valve is normal in structure. Trivial mitral valve regurgitation. No evidence of mitral stenosis.  4. The aortic valve is tricuspid. There is mild calcification of the aortic valve. Aortic valve regurgitation is not visualized. No aortic stenosis is present.  5. The inferior vena cava is normal in size with greater than 50% respiratory variability, suggesting right atrial pressure of 3 mmHg. Conclusion(s)/Recommendation(s): On subcostal imaging there is a large cystic structure adjacent to the liver. This has been previously noted on abdoimant CT imaging. FINDINGS  Left Ventricle: Left ventricular ejection fraction, by estimation, is 60 to 65%. The left ventricle has normal function. The left ventricle has no regional wall motion abnormalities. The left ventricular internal cavity size was normal in size. There is  no left ventricular hypertrophy. Left ventricular diastolic parameters are consistent with Grade I diastolic dysfunction (impaired relaxation). Right Ventricle: The right ventricular size is normal.  No increase in right ventricular wall thickness. Right ventricular systolic function is normal. Left Atrium: Left atrial size was normal in size. Right Atrium: Right atrial size was normal in size. Pericardium: There is no evidence of pericardial effusion. Mitral Valve: The mitral valve is normal in structure. Mild mitral annular calcification. Trivial mitral valve regurgitation. No evidence of mitral valve stenosis. Tricuspid Valve: The tricuspid valve is normal in structure. Tricuspid valve regurgitation is trivial. No evidence of tricuspid stenosis. Aortic Valve: The aortic valve is tricuspid. There is mild calcification of the aortic valve. Aortic valve regurgitation is not visualized. No aortic stenosis is present. Pulmonic Valve: The pulmonic valve was normal in structure. Pulmonic valve regurgitation is not visualized. No evidence of pulmonic stenosis. Aorta: The aortic root is normal in size and structure. Venous: The inferior vena cava is normal in size with greater than 50% respiratory variability, suggesting right atrial pressure of 3 mmHg. IAS/Shunts: No atrial level shunt detected by color flow Doppler.  LEFT VENTRICLE PLAX 2D LVIDd:         4.88 cm   Diastology LVIDs:         2.97 cm   LV e' medial:    7.83 cm/s LV PW:         1.31 cm   LV E/e' medial:  11.3 LV IVS:        1.29 cm   LV e' lateral:   14.30 cm/s LVOT diam:     2.13 cm   LV E/e' lateral: 6.2 LVOT Area:     3.56 cm  RIGHT VENTRICLE             IVC RV S prime:     16.50 cm/s  IVC diam: 1.91 cm TAPSE (M-mode): 1.7 cm LEFT ATRIUM             Index LA diam:        4.56 cm 1.48 cm/m LA Vol (A2C):   48.7 ml 15.82 ml/m LA  Vol (A4C):   61.1 ml 19.85 ml/m LA Biplane Vol: 55.8 ml 18.13 ml/m   AORTA Ao Root diam: 3.19 cm Ao Asc diam:  2.81 cm MITRAL VALVE MV Area (PHT): 5.54 cm     SHUNTS MV Decel Time: 137 msec     Systemic Diam: 2.13 cm MV E velocity: 88.80 cm/s MV A velocity: 108.00 cm/s MV E/A ratio:  0.82 Toribio Fuel MD Electronically  signed by Toribio Fuel MD Signature Date/Time: 08/12/2024/1:20:52 PM    Final     ASSESSMENT/PLAN:  Assessment: Principal Problem:   RUQ abdominal pain Active Problems:   Hypertension   History of pulmonary embolism   Lymphedema of both lower extremities   Intraabdominal fluid collection   Plan:  #Complex abdominal cystic mass #Recurrent hepatic cysts #Hx of right abdominal abscess (2023) Today her abdominal pain is much improved - it resolved as soon as the fluid collection was drained yesterday by IR. Unable to obtain CT a/p due to body habitus. LFT normal but notes bili border 1.2. Normal lipase. hCG negative. Unclear if she had f/u with general surgery after abscess drainage. Fluid collection is likely bacterial in nature as initial gram stain of drained fluid showing primarily PMNs with few gram-negative rods. Culture is pending. Lactic acid improved with IVF. She has a history of candidemia in the past but low concern for her current admission. IR aspirated 5L from the complex abdominal cystic mass seen on RUQUS measuring 2.6 x 2.3 x 2.3 cm. We have since re-engaged IR regarding potential drain placement for recurrent fluid collection from hepatic cysts. Consulted ID for assistance regarding antimicrobial coverage. We will check another RUQUS to evaluate if fluid collection has returned. -ID recs:  -start ampicillin -sulbactam (i8/21/25 - )  -stop antifungals; avoid due to supratherapeutic INR  -ESR elevated at 133  -CRP elevated at 44.6 -IR recs:  -s/p drainage of 5L on 08/12/24  -awaiting further recommendations - may offer the patient EtOH ablation -RUQUS pending -Pain Regimen:  -Tylenol  1000 mg Q6H PRN and oxycodone  5 mg Q4H PRN moderate/severe   #Dyspnea, resolved #Sinus tachycardia #Hx of DVT and PE on Warfarin She initially presented with dyspnea but this has since resolved. Has been taking warfarin as prescribed with therapeutic INR. D-dimer 3.88. Unable to obtain CT  or V/Q imaging due to body habitus. TTE shows normal EF and no right heart strain. Wells score of at least 3 but she is adherent to warfarin with INR of 2.8 in goal range. Low suspicion for PE given adequate warfarin use. Troponin without significant rise 10->11. Not as concerned for PE given active anticoagulation and lack of echo findings. However, CXR showing right basilar opacity c/f atelectasis vs. PNA. Procal slightly elevated but this may be in the setting of hepatic cyst/abscess. She is already on Unasyn  for subhepatic fluid collection but this does not include coverage for atypicals. Not likely PNA due to improvement of symptoms with normal O2 saturations. Will hold off on CAP coverage, suspected atelectasis which may be improving. Tachycardia most likely due to infectious etiology given elevated inflammatory markers. -Echo: Normal EF, no right heart strain -Continue telemetry -Continue warfarin with pharmacy consult (INR goal 2-3)  #Hx of candidemia  04/16/22 Bcx grew Candida parapsilosis and treated with micafungin  then fluconazole  for 14 days. TEE w/o vegetations. Repeat Bcx negative. WBC 14.2. Recent treatment of sinus infection per patient. Denies any new cough or dysuria. Denies any acute skin infections. Pending Bcx on this admission. ID consulted and  recommended holding off on antifungals d/t findings of gram negative rods on stain of abdominal fluid collection. -F/u Bcx -F/u fluid collection cx   #AKI Scr 1.10 from what appears baseline of 0.6-0.8. Downtrending to 1.01. BUN creatinine ratio of 11. Denies any urinary symptoms of dysuria, frequency or hematuria. Denies any recent NSAID use and only been taking Tylenol . S/p IVF. Pending UA collection still.  -Trend BMP -S/p LR bolus, gave additional bolus   #HTN Notes hx of this but is normotensive. No longer on antihypertensives due to insurance change for several months.    #Chronic Anemia, normocytic  #Hx of B12 deficiency  No  reported bleeding symptoms. Hgb 9.7, appears stable. Baseline around 10-11. Denies any acute signs of bleeding while on Warfarin. Last B12 low 180. B12 during this admission of 185. Not on supplementation per patient.   -Trend CBC and signs of bleed  #Hepatic steatosis #Morbid obesity Current and prior imaging showing hepatic steatosis. LFTs were normal on admission but AST is mildly elevated today at 63 which could represent mild inflammatory changes s/p IR drainage of hepatic cyst. Other likely causes include MASH given significantly elevated BMI. -trend LFTs  Best Practice: Diet: Cardiac diet IVF: Fluids: none VTE: Warfarin Code: Full  Disposition planning: Therapy Recs: Pending, DME: pending DISPO: Anticipated discharge in 1-2 days to Home pending clinical improvement.  Signature:  Letha Cheadle, MD [facility]  11:16 AM, 08/13/2024  On Call pager [phone]

## 2024-08-14 ENCOUNTER — Inpatient Hospital Stay (HOSPITAL_COMMUNITY)

## 2024-08-14 DIAGNOSIS — Z86718 Personal history of other venous thrombosis and embolism: Secondary | ICD-10-CM

## 2024-08-14 DIAGNOSIS — I10 Essential (primary) hypertension: Secondary | ICD-10-CM

## 2024-08-14 DIAGNOSIS — Z7901 Long term (current) use of anticoagulants: Secondary | ICD-10-CM

## 2024-08-14 DIAGNOSIS — Z8719 Personal history of other diseases of the digestive system: Secondary | ICD-10-CM

## 2024-08-14 DIAGNOSIS — Z86711 Personal history of pulmonary embolism: Secondary | ICD-10-CM

## 2024-08-14 DIAGNOSIS — N939 Abnormal uterine and vaginal bleeding, unspecified: Secondary | ICD-10-CM

## 2024-08-14 DIAGNOSIS — K760 Fatty (change of) liver, not elsewhere classified: Secondary | ICD-10-CM

## 2024-08-14 DIAGNOSIS — R1011 Right upper quadrant pain: Secondary | ICD-10-CM

## 2024-08-14 LAB — CBC WITH DIFFERENTIAL/PLATELET
Abs Granulocyte: 8.7 K/uL — ABNORMAL HIGH (ref 1.5–6.5)
Abs Immature Granulocytes: 0.76 K/uL — ABNORMAL HIGH (ref 0.00–0.07)
Basophils Absolute: 0.1 K/uL (ref 0.0–0.1)
Basophils Relative: 0 %
Eosinophils Absolute: 0.2 K/uL (ref 0.0–0.5)
Eosinophils Relative: 2 %
HCT: 30.7 % — ABNORMAL LOW (ref 36.0–46.0)
Hemoglobin: 9 g/dL — ABNORMAL LOW (ref 12.0–15.0)
Immature Granulocytes: 7 %
Lymphocytes Relative: 10 %
Lymphs Abs: 1.1 K/uL (ref 0.7–4.0)
MCH: 24.2 pg — ABNORMAL LOW (ref 26.0–34.0)
MCHC: 29.3 g/dL — ABNORMAL LOW (ref 30.0–36.0)
MCV: 82.5 fL (ref 80.0–100.0)
Monocytes Absolute: 1 K/uL (ref 0.1–1.0)
Monocytes Relative: 8 %
Neutro Abs: 8.7 K/uL — ABNORMAL HIGH (ref 1.7–7.7)
Neutrophils Relative %: 73 %
Platelets: 456 K/uL — ABNORMAL HIGH (ref 150–400)
RBC: 3.72 MIL/uL — ABNORMAL LOW (ref 3.87–5.11)
RDW: 16.7 % — ABNORMAL HIGH (ref 11.5–15.5)
Smear Review: NORMAL
WBC: 11.8 K/uL — ABNORMAL HIGH (ref 4.0–10.5)
nRBC: 0 % (ref 0.0–0.2)

## 2024-08-14 LAB — URINALYSIS, ROUTINE W REFLEX MICROSCOPIC
Bilirubin Urine: NEGATIVE
Glucose, UA: NEGATIVE mg/dL
Ketones, ur: NEGATIVE mg/dL
Leukocytes,Ua: NEGATIVE
Nitrite: NEGATIVE
Protein, ur: NEGATIVE mg/dL
RBC / HPF: >50 RBC/hpf (ref 0–5)
Specific Gravity, Urine: 1.023 (ref 1.005–1.030)
pH: 5 (ref 5.0–8.0)

## 2024-08-14 LAB — COMPREHENSIVE METABOLIC PANEL WITH GFR
ALT: 55 U/L — ABNORMAL HIGH (ref 0–44)
AST: 116 U/L — ABNORMAL HIGH (ref 15–41)
Albumin: 1.8 g/dL — ABNORMAL LOW (ref 3.5–5.0)
Alkaline Phosphatase: 117 U/L (ref 38–126)
Anion gap: 11 (ref 5–15)
BUN: 14 mg/dL (ref 6–20)
CO2: 25 mmol/L (ref 22–32)
Calcium: 8.2 mg/dL — ABNORMAL LOW (ref 8.9–10.3)
Chloride: 96 mmol/L — ABNORMAL LOW (ref 98–111)
Creatinine, Ser: 0.78 mg/dL (ref 0.44–1.00)
GFR, Estimated: >60 mL/min (ref >60)
Glucose, Bld: 176 mg/dL — ABNORMAL HIGH (ref 70–99)
Potassium: 4.1 mmol/L (ref 3.5–5.1)
Sodium: 132 mmol/L — ABNORMAL LOW (ref 135–145)
Total Bilirubin: 0.7 mg/dL (ref 0.0–1.2)
Total Protein: 7.2 g/dL (ref 6.5–8.1)

## 2024-08-14 LAB — IRON AND TIBC
Iron: 12 ug/dL — ABNORMAL LOW (ref 28–170)
Saturation Ratios: 5 % — ABNORMAL LOW (ref 10.4–31.8)
TIBC: 239 ug/dL — ABNORMAL LOW (ref 250–450)
UIBC: 227 ug/dL

## 2024-08-14 LAB — PROTIME-INR
INR: 3.4 — ABNORMAL HIGH (ref 0.8–1.2)
Prothrombin Time: 36 s — ABNORMAL HIGH (ref 11.4–15.2)

## 2024-08-14 LAB — FERRITIN: Ferritin: 430 ng/mL — ABNORMAL HIGH (ref 11–307)

## 2024-08-14 MED ORDER — ONDANSETRON 4 MG PO TBDP
4.0000 mg | ORAL_TABLET | Freq: Three times a day (TID) | ORAL | Status: DC | PRN
  Administered 2024-08-14: 4 mg via ORAL
  Filled 2024-08-14: qty 1

## 2024-08-14 MED ORDER — METHOCARBAMOL 500 MG PO TABS
750.0000 mg | ORAL_TABLET | Freq: Four times a day (QID) | ORAL | Status: DC | PRN
  Administered 2024-08-14 – 2024-08-18 (×8): 750 mg via ORAL
  Filled 2024-08-14 (×8): qty 2

## 2024-08-14 MED ORDER — CAMPHOR-MENTHOL 0.5-0.5 % EX LOTN
TOPICAL_LOTION | CUTANEOUS | Status: DC | PRN
  Administered 2024-08-14: 1 via TOPICAL
  Filled 2024-08-14: qty 222

## 2024-08-14 NOTE — Consult Note (Signed)
 Chief Complaint: Abdominal recurrent cystic fluid collection  Referring Provider(s): Dr. Francesco  Supervising Physician: Johann Sieving  Patient Status: Susan Warren  History of Present Illness: Susan Warren is a 47 y.o. female with  PMH significant for PE and DVT (warfarin), complex right abdominal cyst, candidemia (2023), HTN, morbid obesity, who presented with SOB and RUQ abdominal pain that she describes as circumferential stabbing sensation primarily in upper abd. She was admitted for recurrent RUQ cyst concerning for abscess.   Dr.Shick aspirated 5 L of fluid on 8/22 with symptom improvement. Fluid culture grew klebsiella pneumoniae (neg blood cultures), leading to IR consult for drain placement.   Denies previous complications related to sedation. On room air.   Denies fever, chills, SOB, CP, sore throat, N/V, abd pain, blood in stool or urine while IP. Notes that she had a low grade fever at home prior to arrival.   Allergies Reviewed:  Adhesive [tape]   Patient is Full Code  Past Medical History:  Diagnosis Date   Abdominal pain due to right abdominal and pelvic cystic mass 04/16/2022   Allergy    Anxiety    Candidemia (HCC) 04/18/2022   Depression    DVT (deep venous thrombosis) (HCC)    GERD (gastroesophageal reflux disease)    Hepatic cyst    Hypertension    Lymphedema of both lower extremities    Migraines    Morbid obesity with BMI of 70 and over, adult [facility])    PE (pulmonary thromboembolism) (HCC) 03/2019   Pneumonia 04/09/2022   Sleep apnea     Past Surgical History:  Procedure Laterality Date   CHOLECYSTECTOMY     IR INFUSION THROMBOL ARTERIAL INITIAL (MS)  03/29/2019   IR INFUSION THROMBOL ARTERIAL INITIAL (MS)  03/29/2019   IR THROMB F/U EVAL ART/VEN FINAL DAY (MS)  03/30/2019   IR US  GUIDE BX ASP/DRAIN  08/12/2024   IR US  GUIDE VASC ACCESS RIGHT  03/29/2019   TEE WITHOUT CARDIOVERSION N/A 04/23/2022   Procedure: TRANSESOPHAGEAL ECHOCARDIOGRAM (TEE);   Surgeon: Francyne Headland, MD;  Location: [facility] ENDOSCOPY;  Service: Cardiovascular;  Laterality: N/A;      Medications: Prior to Admission medications   Medication Sig Start Date End Date Taking? Authorizing Provider  acetaminophen  (TYLENOL ) 325 MG tablet Take 2 tablets (650 mg total) by mouth every 6 (six) hours as needed for mild pain (or Fever >/= 101). 04/11/22  Yes Elgergawy, Brayton RAMAN, MD  amoxicillin -clavulanate (AUGMENTIN ) 875-125 MG tablet Take 1 tablet by mouth every 12 (twelve) hours. 08/08/24  Yes [provider]  cyanocobalamin  ([employer] VITAMIN B12) 1000 MCG tablet Take 1 tablet (1,000 mcg total) by mouth daily. 01/28/23  Yes Brien Belvie BRAVO, MD  warfarin (COUMADIN ) 5 MG tablet Take 1 tablet (5mg  total) on Mondays, Wednesdays, and Fridays with 1/2 tablet (2.5mg ) all other days. 06/01/24  Yes Brien Belvie BRAVO, MD     Family History  Problem Relation Age of Onset   Hypertension Mother    Thyroid disease Mother    Hypertension Maternal Grandmother    Diabetes Maternal Grandmother    Breast cancer Maternal Grandmother    Stroke Maternal Grandmother    Heart attack Neg Hx     Social History   Socioeconomic History   Marital status: Widowed    Spouse name: Not on file   Number of children: Not on file   Years of education: 12   Highest education level: Associate degree: occupational, Scientist, product/process development, or vocational program  Occupational History   Occupation: Admin.    Employer: [employer]  Tobacco Use   Smoking status: Never   Smokeless tobacco: Never  Vaping Use   Vaping status: Never Used  Substance and Sexual Activity   Alcohol use: Yes    Comment: occ   Drug use: No   Sexual activity: Not Currently    Birth control/protection: I.U.D.  Other Topics Concern   Not on file  Social History Narrative   Married, 1 child   Social Drivers of Corporate investment banker Strain: Low Risk  (12/11/2023)   Overall Financial Resource Strain (CARDIA)    Difficulty of  Paying Living Expenses: Not hard at all  Food Insecurity: Food Insecurity Present (12/11/2023)   Hunger Vital Sign    Worried About Running Out of Food in the Last Year: Never true    Ran Out of Food in the Last Year: Sometimes true  Transportation Needs: No Transportation Needs (12/11/2023)   PRAPARE - Administrator, Civil Service (Medical): No    Lack of Transportation (Non-Medical): No  Physical Activity: Insufficiently Active (12/11/2023)   Exercise Vital Sign    Days of Exercise per Week: 2 days    Minutes of Exercise per Session: 10 min  Stress: No Stress Concern Present (12/11/2023)   [employer] of Occupational Health - Occupational Stress Questionnaire    Feeling of Stress : Only a little  Social Connections: Unknown (12/11/2023)   Social Connection and Isolation Panel    Frequency of Communication with Friends and Family: More than three times a week    Frequency of Social Gatherings with Friends and Family: Once a week    Attends Religious Services: Patient declined    Database administrator or Organizations: No    Attends Engineer, structural: Not on file    Marital Status: Widowed     Review of Systems: A 12 point ROS discussed and pertinent positives are indicated in the HPI above.  All other systems are negative.    Vital Signs: BP 131/66 (BP Location: Left Arm)   Pulse (!) 102   Temp 98.9 F (37.2 C) (Oral)   Resp 20   Ht 5' 4 (1.626 m)   Wt (!) 581 lb (263.5 kg)   SpO2 95%   BMI 99.73 kg/m     Physical Exam Constitutional:      Appearance: She is obese.  HENT:     Mouth/Throat:     Mouth: Mucous membranes are moist.     Dentition: Dental caries present.     Pharynx: Oropharynx is clear.  Cardiovascular:     Rate and Rhythm: Regular rhythm. Tachycardia present.     Pulses: Normal pulses.     Heart sounds: Normal heart sounds.  Pulmonary:     Effort: Pulmonary effort is normal.     Breath sounds: Normal breath  sounds.  Abdominal:     Palpations: Abdomen is soft.     Tenderness: There is no abdominal tenderness.     Comments: Well healing puncture site midline from previous puncture, dermabond still present.   Skin:    General: Skin is warm and dry.  Neurological:     Mental Status: She is alert and oriented to person, place, and time.  Psychiatric:        Mood and Affect: Mood normal.        Behavior: Behavior normal.        Thought Content: Thought  content normal.        Judgment: Judgment normal.     Imaging: US  Abdomen Limited RUQ (LIVER/GB) Result Date: 08/13/2024 CLINICAL DATA:  Follow-up cystic liver lesion following needle aspiration. EXAM: ULTRASOUND ABDOMEN LIMITED COMPARISON:  08/12/2024 FINDINGS: A thick-walled cystic lesion is seen involving the liver. This currently measures 17.5 x 15.0 x 17.6 cm, compared to 25.7 x 23.7 x 23.0 cm previously. IMPRESSION: Large complex thick-walled cystic lesion involving the liver shows mild decrease in size since previous study. Electronically Signed   By: Norleen DELENA Kil M.D.   On: 08/13/2024 15:47   IR US  Guide Bx Asp/Drain Result Date: 08/13/2024 INDICATION: Chronic very large cystic abdominal mass presumed to be a recurrent exophytic liver cyst EXAM: ULTRASOUND ASPIRATION AND DRAINAGE OF THE LARGE ABDOMINAL CYSTIC MASS MEDICATIONS: The patient is currently admitted to the hospital and receiving intravenous antibiotics. The antibiotics were administered within an appropriate time frame prior to the initiation of the procedure. ANESTHESIA/SEDATION: None. COMPLICATIONS: None immediate. PROCEDURE: Informed written consent was obtained from the patient after a thorough discussion of the procedural risks, benefits and alternatives. All questions were addressed. Maximal Sterile Barrier Technique was utilized including caps, mask, sterile gowns, sterile gloves, sterile drape, hand hygiene and skin antiseptic. A timeout was performed prior to the initiation of  the procedure. Previous imaging reviewed. Preliminary ultrasound performed. The large abdominal cystic mass was localized and marked percutaneous needle access in the midline. Under sterile conditions and local anesthesia, the cystic lesion was accessed with a 6 French Safe-T-Centesis needle catheter. Needle position confirmed with ultrasound. Images obtained for documentation. Suction aspiration yielded 5 L of debris-filled brown cystic fluid. Sample sent for culture and cytology. Postprocedure imaging demonstrates significant collapse of the cystic lesion. No immediate complication. Patient tolerated the procedure well. IMPRESSION: Successful ultrasound aspiration of the large abdominal cystic mass. 5 L of debris-filled brown cystic fluid removed. Sample sent for culture and cytology. Electronically Signed   By: CHRISTELLA.  Shick M.D.   On: 08/13/2024 07:57   ECHOCARDIOGRAM COMPLETE Result Date: 08/12/2024    ECHOCARDIOGRAM REPORT   Patient Name:   ZNIYAH MIDKIFF Date of Exam: 08/12/2024 Medical Rec #:  [id number]      Height:       64.0 in Accession #:    [id number]     Weight:       581.0 lb Date of Birth:  15-Dec-1976      BSA:          3.078 m Patient Age:    48 years       BP:           131/73 mmHg Patient Gender: F              HR:           104 bpm. Exam Location:  Inpatient Procedure: 2D Echo, Cardiac Doppler, Color Doppler and Intracardiac            Opacification Agent (Both Spectral and Color Flow Doppler were            utilized during procedure). Indications:    Syncope  History:        Patient has prior history of Echocardiogram examinations, most                 recent 04/17/2022. Risk Factors:Hypertension.  Sonographer:    Therisa Crouch Referring Phys: [id number] ELSIE KATHEE SAVANNAH  Sonographer Comments: Image acquisition challenging due to patient body habitus. IMPRESSIONS  1. Left ventricular ejection fraction, by estimation, is 60 to 65%. The left ventricle has normal function. The left ventricle has no  regional wall motion abnormalities. Left ventricular diastolic parameters are consistent with Grade I diastolic dysfunction (impaired relaxation).  2. Right ventricular systolic function is normal. The right ventricular size is normal.  3. The mitral valve is normal in structure. Trivial mitral valve regurgitation. No evidence of mitral stenosis.  4. The aortic valve is tricuspid. There is mild calcification of the aortic valve. Aortic valve regurgitation is not visualized. No aortic stenosis is present.  5. The inferior vena cava is normal in size with greater than 50% respiratory variability, suggesting right atrial pressure of 3 mmHg. Conclusion(s)/Recommendation(s): On subcostal imaging there is a large cystic structure adjacent to the liver. This has been previously noted on abdoimant CT imaging. FINDINGS  Left Ventricle: Left ventricular ejection fraction, by estimation, is 60 to 65%. The left ventricle has normal function. The left ventricle has no regional wall motion abnormalities. The left ventricular internal cavity size was normal in size. There is  no left ventricular hypertrophy. Left ventricular diastolic parameters are consistent with Grade I diastolic dysfunction (impaired relaxation). Right Ventricle: The right ventricular size is normal. No increase in right ventricular wall thickness. Right ventricular systolic function is normal. Left Atrium: Left atrial size was normal in size. Right Atrium: Right atrial size was normal in size. Pericardium: There is no evidence of pericardial effusion. Mitral Valve: The mitral valve is normal in structure. Mild mitral annular calcification. Trivial mitral valve regurgitation. No evidence of mitral valve stenosis. Tricuspid Valve: The tricuspid valve is normal in structure. Tricuspid valve regurgitation is trivial. No evidence of tricuspid stenosis. Aortic Valve: The aortic valve is tricuspid. There is mild calcification of the aortic valve. Aortic valve  regurgitation is not visualized. No aortic stenosis is present. Pulmonic Valve: The pulmonic valve was normal in structure. Pulmonic valve regurgitation is not visualized. No evidence of pulmonic stenosis. Aorta: The aortic root is normal in size and structure. Venous: The inferior vena cava is normal in size with greater than 50% respiratory variability, suggesting right atrial pressure of 3 mmHg. IAS/Shunts: No atrial level shunt detected by color flow Doppler.  LEFT VENTRICLE PLAX 2D LVIDd:         4.88 cm   Diastology LVIDs:         2.97 cm   LV e' medial:    7.83 cm/s LV PW:         1.31 cm   LV E/e' medial:  11.3 LV IVS:        1.29 cm   LV e' lateral:   14.30 cm/s LVOT diam:     2.13 cm   LV E/e' lateral: 6.2 LVOT Area:     3.56 cm  RIGHT VENTRICLE             IVC RV S prime:     16.50 cm/s  IVC diam: 1.91 cm TAPSE (M-mode): 1.7 cm LEFT ATRIUM             Index LA diam:        4.56 cm 1.48 cm/m LA Vol (A2C):   48.7 ml 15.82 ml/m LA Vol (A4C):   61.1 ml 19.85 ml/m LA Biplane Vol: 55.8 ml 18.13 ml/m   AORTA Ao Root diam: 3.19 cm Ao Asc diam:  2.81 cm MITRAL VALVE MV Area (PHT): 5.54 cm     SHUNTS MV Decel Time: 137 msec  Systemic Diam: 2.13 cm MV E velocity: 88.80 cm/s MV A velocity: 108.00 cm/s MV E/A ratio:  0.82 Toribio Fuel MD Electronically signed by Toribio Fuel MD Signature Date/Time: 08/12/2024/1:20:52 PM    Final    US  Abdomen Limited RUQ (LIVER/GB) Result Date: 08/12/2024 CLINICAL DATA:  Right upper quadrant abdominal pain. Status post ultrasound-guided drainage of a large cystic mass inferior to the liver on 04/22/2022 with no organism growth on culture of the fluid and rare white blood cells in the fluid. There was no malignancy on cytological evaluation of the fluid. The cytological evaluation did demonstrate numerous acute inflammatory cells and necrotic debris. EXAM: ULTRASOUND ABDOMEN LIMITED RIGHT UPPER QUADRANT COMPARISON:  Ultrasound-guided abdominal cyst aspiration dated  04/16/2022. Abdomen and pelvis CT dated 04/16/2022. FINDINGS: Gallbladder: Surgically absent. Common bile duct: Diameter: 5.0 mm Liver: No focal lesion identified. Within normal limits in parenchymal echogenicity. Portal vein is patent on color Doppler imaging with normal direction of blood flow towards the liver. Other: There has been re-accumulation of fluid containing diffuse internal echoes in the previously aspirated large cystic mass inferior to the liver. This measures 2.6 x 2.3 x 2.3 cm. No internal blood flow was seen with color Doppler. IMPRESSION: 1. Re-accumulation of fluid containing diffuse internal echoes in the previously aspirated large cystic mass inferior to the liver. This measures 2.6 x 2.3 x 2.3 cm. This is compatible with a recurrent complicated cyst of unknown origin based on the previous cytology and culture results. 2. Status post cholecystectomy. 3. Otherwise, normal examination. Electronically Signed   By: Elspeth Bathe M.D.   On: 08/12/2024 10:44   DG Chest Portable 1 View Result Date: 08/12/2024 CLINICAL DATA:  Chest pain. Tachycardia. History of pulmonary embolism. EXAM: PORTABLE CHEST - 1 VIEW COMPARISON:  04/07/2022 FINDINGS: Cardiomediastinal silhouette and pulmonary vasculature are within normal limits. RIGHT basilar opacity may be atelectasis or pneumonia. Lungs are otherwise clear. IMPRESSION: RIGHT basilar opacity may be due to atelectasis or pneumonia. Electronically Signed   By: Aliene Lloyd M.D.   On: 08/12/2024 09:24    Labs:  CBC: Recent Labs    08/12/24 0658 08/13/24 0337 08/14/24 0338  WBC 14.2* 14.1* 11.8*  HGB 9.7* 9.6* 9.0*  HCT 33.4* 32.3* 30.7*  PLT 432* 451* 456*    COAGS: Recent Labs    06/01/24 1549 08/12/24 0659 08/13/24 0337 08/14/24 0338  INR 2.2 2.8* 3.1* 3.4*    BMP: Recent Labs    08/12/24 0658 08/13/24 0337 08/14/24 0338  NA 137 134* 132*  K 3.9 3.8 4.1  CL 100 99 96*  CO2 21* 24 25  GLUCOSE 179* 134* 176*  BUN 12 14  14   CALCIUM 8.3* 8.5* 8.2*  CREATININE 1.10* 1.01* 0.78  GFRNONAA >60 >60 >60    LIVER FUNCTION TESTS: Recent Labs    08/12/24 0658 08/13/24 0337 08/14/24 0338  BILITOT 1.2 0.9 0.7  AST 16 63* 116*  ALT 10 26 55*  ALKPHOS 103 90 117  PROT 7.8 7.8 7.2  ALBUMIN 2.2* 2.0* 1.8*    TUMOR MARKERS: No results for input(s): AFPTM, CEA, CA199, CHROMGRNA in the last 8760 hours.  Assessment and Plan:  Request for  image guided abdominal fluid collection drain placement approved by Dr. Johann for Monday.  No contraindications for procedure identified in ROS, physical exam, or review of pre-sedation considerations. INR is elevated (on warfarin). Shared with Dr. Johann; no hold for warfarin requested. Initial procedure done without warfarin hold. INR to be redrawn Monday  morning, already part of IP planning, will not duplicate order.  8/22 US   imaging available and reviewed: A thick-walled cystic lesion is seen involving the liver. This currently measures 17.5 x 15.0 x 17.6 cm, compared to 25.7 x 23.7 x 23.0 cm previously. Afebrile, mildly tachycardic, BP stable    Risks and benefits discussed with the patient including bleeding, infection, damage to adjacent structures, bowel perforation/fistula connection, and sepsis.  All of the patient's questions were answered, patient is agreeable to proceed. Consent signed and in chart.   Thank you for allowing our service to participate in SHERRILL BUIKEMA 's care.    Electronically Signed: Laymon Coast, NP   08/14/2024, 2:25 PM     I spent a total of 20 Minutes    in face to face in clinical consultation, greater than 50% of which was counseling/coordinating care for RUQ fluid collection drain placement.    (A copy of this note was sent to the referring provider and the time of visit.)

## 2024-08-14 NOTE — Progress Notes (Signed)
 PHARMACY - ANTICOAGULATION CONSULT NOTE  Pharmacy Consult for warfarin dosing. Indication: VTE prophylaxis  Allergies  Allergen Reactions   Adhesive [Tape] Anaphylaxis and Swelling    *Adhesive Spray*     Patient Measurements: Height: 5' 4 (162.6 cm) Weight: (!) 263.5 kg (581 lb) IBW/kg (Calculated) : 54.7  Vital Signs: Temp: 98.3 F (36.8 C) (08/23 0821) Temp Source: Oral (08/23 0442) BP: 110/56 (08/23 0821) Pulse Rate: 106 (08/23 0821)  Labs: Recent Labs    08/12/24 0658 08/12/24 0659 08/12/24 0923 08/13/24 0337 08/14/24 0338  HGB 9.7*  --   --  9.6* 9.0*  HCT 33.4*  --   --  32.3* 30.7*  PLT 432*  --   --  451* 456*  LABPROT  --  31.1*  --  33.1* 36.0*  INR  --  2.8*  --  3.1* 3.4*  CREATININE 1.10*  --   --  1.01* 0.78  TROPONINIHS 10  --  11  --   --     Estimated Creatinine Clearance: 183.5 mL/min (by C-G formula based on SCr of 0.78 mg/dL).   Medical History: Past Medical History:  Diagnosis Date   Abdominal pain due to right abdominal and pelvic cystic mass 04/16/2022   Allergy    Anxiety    Candidemia (HCC) 04/18/2022   Depression    DVT (deep venous thrombosis) (HCC)    GERD (gastroesophageal reflux disease)    Hepatic cyst    Hypertension    Lymphedema of both lower extremities    Migraines    Morbid obesity with BMI of 70 and over, adult [facility])    PE (pulmonary thromboembolism) (HCC) 03/2019   Pneumonia 04/09/2022   Sleep apnea     Medications:  Scheduled:   Warfarin - Pharmacist Dosing Inpatient   Does not apply q1600    Assessment: Pt is a 48yoF with hx of PE and DVT on warfarin. Patient admitted for infected hepatic cyst evaluation and treatment. Prior to admission dosing of warfarin as follows: Warfarin 5 mg MWF, 2.5 mg on other days (last dose Wed 8/20 ~1800 hours documented in review of PTA medication history.)   8/23 AM update:  INR 3.4 is supratherapeutic. Uptrend secondary to fluconazole  dose x1 on 8/21. Patient was also  switched to Zosyn  which can elevate INR.  Hgb and PLT WNL.   Goal of Therapy:  INR 2-3 Monitor platelets by anticoagulation protocol: Yes   Plan:  Given that INR is above therapeutic goal range of 2-3, will hold warfarin dose today.  Monitor daily INR Monitor for signs/symptoms of bleeding, Hgb, and PLT.  Izetta Carl, PharmD PGY1 Pharmacy Resident [facility]  08/14/2024 8:30 AM

## 2024-08-14 NOTE — Consult Note (Signed)
   OB/GYN Telephone Consult  Susan Warren is a 48 y.o. G1P1001 presenting with RUQ cyst but had episode of PMB.   I was called for a consult regarding the care of this patient by Dr. Rosan.    The provider had a clinical question regarding outpatient follow up for the PMB.    The provider presented the following relevant clinical information and I performed a chart review on the patient and reviewed available documentation:  Pt had attempt at IUD removal in 2014. She was amenorrheic at that time. IUD noted to be a Paragard. She had one episode of clot passage during this hospital admission.   IUD still present by CT report in 2023.   BP (!) 113/59   Pulse (!) 102   Temp 98.3 F (36.8 C)   Resp (!) 30   Ht 5' 4 (1.626 m)   Wt (!) 263.5 kg   SpO2 93%   BMI 99.73 kg/m   Exam- performed by consulting provider   Recommendations:  - Check FSH while in patient and consider inpatient US  for pt convenience.  - Will attempt EMB and IUD removal in the office.  - Message sent to [facility] to set up with me on 9/9.    I spent approximately 5 minutes directly consulting with the provider and verbally discussing this case. Additionally 10 minutes minutes was spent performing chart review and documentation.   Vina Solian, MD Attending Obstetrician & Gynecologist, [facility] for [facility], [facility], [facility]

## 2024-08-14 NOTE — Plan of Care (Signed)

## 2024-08-14 NOTE — Progress Notes (Addendum)
 HD#1 SUBJECTIVE:  Patient Summary: Susan Warren is a 48 y.o. with a pertinent PMH of PE and DVT on warfarin, complex right abdominal cyst, candidemia (2023), HTN, morbid obesity, who presented with SOB and RUQ abdominal pain and admitted for recurrent RUQ cyst concerning for abscess. IR drained 5 L of fluid on 8/22 with symptom improvement. She has been broadened to zosyn  and fluid culture grew klebsiella pneumoniae with negative blood cultures.   Overnight Events:  none  Interim History:  Still having some soreness in the RUQ after the procedure but no return of the sharp pain she was experiencing on admission. She is not comfortable with the current plan of treating again when symptoms return and would like to discuss this with IR.   OBJECTIVE:  Vital Signs: Vitals:   08/13/24 1522 08/13/24 2058 08/14/24 0001 08/14/24 0442  BP: 109/71 129/68 111/60 (!) 106/59  Pulse: (!) 101 (!) 102 (!) 103 (!) 109  Resp: 14 20 20 20   Temp: 98.3 F (36.8 C) 98.1 F (36.7 C) 98.8 F (37.1 C) 98.3 F (36.8 C)  TempSrc: Oral Oral Oral Oral  SpO2: 97% 97% 96% 96%  Weight:      Height:       Supplemental O2: Room Air SpO2: 96 %  Filed Weights   08/12/24 0549  Weight: (!) 263.5 kg     Intake/Output Summary (Last 24 hours) at 08/14/2024 0618 Last data filed at 08/13/2024 2100 Gross per 24 hour  Intake 540 ml  Output --  Net 540 ml   Net IO Since Admission: 3,137.46 mL [08/14/24 0618]  Physical Exam: Constitutional: comfortable appearing, morbidly obese female, sitting up in hospital bed in no acute distress Pulmonary/Chest: normal work of breathing on room air, Abdominal: soft, non-tender, gauze covering prior aspiration site in midline upper abdomen - no erythema, warmth, or purulent drainage noted. Extremities: no cyanosis present; significant BLE edema present; peripheral pulses intact    Patient Lines/Drains/Airways Status     Active Line/Drains/Airways     Name Placement  date Placement time Site Days   Peripheral IV 08/12/24 20 G 2.5 Anterior;Right Forearm 08/12/24  0647  Forearm  1   External Urinary Catheter 04/16/22  2300  --  850            Pertinent labs and imaging:      Latest Ref Rng & Units 08/14/2024    3:38 AM 08/13/2024    3:37 AM 08/12/2024    6:58 AM  CBC  WBC 4.0 - 10.5 K/uL 11.8  14.1  14.2   Hemoglobin 12.0 - 15.0 g/dL 9.0  9.6  9.7   Hematocrit 36.0 - 46.0 % 30.7  32.3  33.4   Platelets 150 - 400 K/uL 456  451  432        Latest Ref Rng & Units 08/14/2024    3:38 AM 08/13/2024    3:37 AM 08/12/2024    6:58 AM  CMP  Glucose 70 - 99 mg/dL 823  865  820   BUN 6 - 20 mg/dL 14  14  12    Creatinine 0.44 - 1.00 mg/dL 9.21  8.98  8.89   Sodium 135 - 145 mmol/L 132  134  137   Potassium 3.5 - 5.1 mmol/L 4.1  3.8  3.9   Chloride 98 - 111 mmol/L 96  99  100   CO2 22 - 32 mmol/L 25  24  21    Calcium 8.9 - 10.3 mg/dL  8.2  8.5  8.3   Total Protein 6.5 - 8.1 g/dL 7.2  7.8  7.8   Total Bilirubin 0.0 - 1.2 mg/dL 0.7  0.9  1.2   Alkaline Phos 38 - 126 U/L 117  90  103   AST 15 - 41 U/L 116  63  16   ALT 0 - 44 U/L 55  26  10     US  Abdomen Limited RUQ (LIVER/GB) Result Date: 08/13/2024 CLINICAL DATA:  Follow-up cystic liver lesion following needle aspiration. EXAM: ULTRASOUND ABDOMEN LIMITED COMPARISON:  08/12/2024 FINDINGS: A thick-walled cystic lesion is seen involving the liver. This currently measures 17.5 x 15.0 x 17.6 cm, compared to 25.7 x 23.7 x 23.0 cm previously. IMPRESSION: Large complex thick-walled cystic lesion involving the liver shows mild decrease in size since previous study. Electronically Signed   By: Norleen DELENA Kil M.D.   On: 08/13/2024 15:47    ASSESSMENT/PLAN:  Assessment: Principal Problem:   RUQ abdominal pain Active Problems:   Hypertension   History of pulmonary embolism   Lymphedema of both lower extremities   Intraabdominal fluid collection   Plan:  #Complex abdominal cystic mass #Recurrent hepatic  cysts #Hx of right abdominal abscess (2023) Abdominal pain is stable and only post procedural soreness. AST/ALT doubled overnight to 116/55 with alk phos and bili WNL. RUQ US  yesterday showed slightly decreased size of RUQ cyst.  Klebsiella pneumonia growing in cystic fluid and with this development IR plans to place a drain. - continue zosyn  per ID - IR drain placement - continue tylenol  1000 mg q6h prn and oxycodone  5 mg q4h prn    #Dyspnea, resolved #Sinus tachycardia #Hx of DVT and PE on Warfarin Continues to be tachycardic to 110 in the setting of ongoing pain. No return of dyspnea.  -Continue telemetry -Continue warfarin with pharmacy consult (INR goal 2-3)  #Hx of candidemia  No fungus in from IR cyst drain this admission.    #AKI, resolved sCr back to baseline at 0.78.    #HTN Stable off meds, normotensive here.    #Chronic Anemia, normocytic  #Hx of B12 deficiency  Hgb stable at 9.0. Alerted by nursing that there were some possible blood clots in the toilet this morning so we will re-evaluate for GI/GU bleeding today.  Abnormal uterine bleeding ParaGard placed in 2003 with unsuccessful removal in 2014.  CT venogram of the pelvis shows IUD still in place at that time and CT of the abdomen pelvis in 2023 does not but images are suboptimal.  She passed some blood clots when she urinated this morning which she denies knowing any other occurrences of this.  Last menstrual period was over 2 years ago.  She denies any lower abdominal pain, vaginal pain, or discharge.  Discussed with on-call OB/GYN who recommended transvaginal ultrasound while inpatient and outpatient follow-up.  Low suspicion for PID however there is some worry for postmenopausal uterine bleeding. - Urinalysis - Transvaginal ultrasound - If the patient is to go to to the OR under general anesthesia OB/GYN requests to be notified as they may be able to evaluate and retrieve the IUD  #Hepatic steatosis #Morbid  obesity Current and prior imaging showing hepatic steatosis. Elevated AST/ALT today likely in relation to procedure and proximity of large abdominal cyst.   - repeat CMP tomorrow   Best Practice: Diet: Cardiac diet IVF: Fluids: none VTE: Warfarin Code: Full  Disposition planning: Therapy Recs: Pending, DME: pending DISPO: Anticipated discharge in 1-2 days to Home  pending clinical improvement and transition to oral antibiotics.  Signature: Fairy Pool, DO Internal Medicine Resident, PGY-3 Please contact the on call pager at [phone] for any urgent or emergent needs. 6:18 AM 08/14/2024

## 2024-08-15 LAB — COMPREHENSIVE METABOLIC PANEL WITH GFR
ALT: 41 U/L (ref 0–44)
AST: 43 U/L — ABNORMAL HIGH (ref 15–41)
Albumin: 1.7 g/dL — ABNORMAL LOW (ref 3.5–5.0)
Alkaline Phosphatase: 128 U/L — ABNORMAL HIGH (ref 38–126)
Anion gap: 9 (ref 5–15)
BUN: 13 mg/dL (ref 6–20)
CO2: 27 mmol/L (ref 22–32)
Calcium: 8.2 mg/dL — ABNORMAL LOW (ref 8.9–10.3)
Chloride: 97 mmol/L — ABNORMAL LOW (ref 98–111)
Creatinine, Ser: 0.87 mg/dL (ref 0.44–1.00)
GFR, Estimated: >60 mL/min (ref >60)
Glucose, Bld: 171 mg/dL — ABNORMAL HIGH (ref 70–99)
Potassium: 4.6 mmol/L (ref 3.5–5.1)
Sodium: 133 mmol/L — ABNORMAL LOW (ref 135–145)
Total Bilirubin: 0.5 mg/dL (ref 0.0–1.2)
Total Protein: 7.4 g/dL (ref 6.5–8.1)

## 2024-08-15 LAB — CBC
HCT: 30.3 % — ABNORMAL LOW (ref 36.0–46.0)
Hemoglobin: 8.8 g/dL — ABNORMAL LOW (ref 12.0–15.0)
MCH: 24.2 pg — ABNORMAL LOW (ref 26.0–34.0)
MCHC: 29 g/dL — ABNORMAL LOW (ref 30.0–36.0)
MCV: 83.2 fL (ref 80.0–100.0)
Platelets: 470 K/uL — ABNORMAL HIGH (ref 150–400)
RBC: 3.64 MIL/uL — ABNORMAL LOW (ref 3.87–5.11)
RDW: 16.8 % — ABNORMAL HIGH (ref 11.5–15.5)
WBC: 13.2 K/uL — ABNORMAL HIGH (ref 4.0–10.5)
nRBC: 0 % (ref 0.0–0.2)

## 2024-08-15 LAB — PROTIME-INR
INR: 3 — ABNORMAL HIGH (ref 0.8–1.2)
Prothrombin Time: 32.8 s — ABNORMAL HIGH (ref 11.4–15.2)

## 2024-08-15 MED ORDER — DICLOFENAC SODIUM 1 % EX GEL
4.0000 g | Freq: Four times a day (QID) | CUTANEOUS | Status: DC
  Administered 2024-08-15 – 2024-08-17 (×4): 4 g via TOPICAL
  Filled 2024-08-15: qty 100

## 2024-08-15 NOTE — Progress Notes (Signed)
 HD#2 SUBJECTIVE:  Patient Summary: Susan Warren is a 48 y.o. with a pertinent PMH of PE and DVT on warfarin, complex right abdominal cyst, candidemia (2023), HTN, morbid obesity, who presented with SOB and RUQ abdominal pain and admitted for recurrent RUQ cyst concerning for abscess. IR drained 5 L of fluid on 8/22 with symptom improvement. She has been broadened to zosyn  and fluid culture grew klebsiella pneumoniae with negative blood cultures.   Overnight Events:  none  Interim History:  Overall stable this morning with some mild return of the sharp right upper quadrant pain but no associated shortness of breath and it is not nearly as bad as when she came in.  No more blood clots noticed in the toilet.  No reported vaginal discharge or pelvic pain.   OBJECTIVE:  Vital Signs: Vitals:   08/14/24 1249 08/14/24 1638 08/14/24 1943 08/14/24 2320  BP: 131/66 (!) 140/48 131/69 122/67  Pulse: (!) 102 (!) 102  99  Resp: 20 (!) 24 20 20   Temp: 98.9 F (37.2 C) 99.1 F (37.3 C) 99.5 F (37.5 C) 98.6 F (37 C)  TempSrc: Oral Oral Oral Oral  SpO2: 95% 93% 96% 94%  Weight:      Height:       Supplemental O2: Room Air SpO2: 94 %  Filed Weights   08/12/24 0549  Weight: (!) 263.5 kg     Intake/Output Summary (Last 24 hours) at 08/15/2024 0631 Last data filed at 08/15/2024 0500 Gross per 24 hour  Intake 440.15 ml  Output --  Net 440.15 ml   Net IO Since Admission: 3,577.61 mL [08/15/24 0631]  Physical Exam: Constitutional: comfortable appearing, morbidly obese female, sitting up in hospital bed in no acute distress Pulmonary/Chest: normal work of breathing on room air, Abdominal: soft, non-tender Extremities: no cyanosis present; significant BLE edema present    Patient Lines/Drains/Airways Status     Active Line/Drains/Airways     Name Placement date Placement time Site Days   Peripheral IV 08/12/24 20 G 2.5 Anterior;Right Forearm 08/12/24  0647  Forearm  1    External Urinary Catheter 04/16/22  2300  --  850            Pertinent labs and imaging:      Latest Ref Rng & Units 08/15/2024    3:34 AM 08/14/2024    3:38 AM 08/13/2024    3:37 AM  CBC  WBC 4.0 - 10.5 K/uL 13.2  11.8  14.1   Hemoglobin 12.0 - 15.0 g/dL 8.8  9.0  9.6   Hematocrit 36.0 - 46.0 % 30.3  30.7  32.3   Platelets 150 - 400 K/uL 470  456  451        Latest Ref Rng & Units 08/15/2024    3:34 AM 08/14/2024    3:38 AM 08/13/2024    3:37 AM  CMP  Glucose 70 - 99 mg/dL 828  823  865   BUN 6 - 20 mg/dL 13  14  14    Creatinine 0.44 - 1.00 mg/dL 9.12  9.21  8.98   Sodium 135 - 145 mmol/L 133  132  134   Potassium 3.5 - 5.1 mmol/L 4.6  4.1  3.8   Chloride 98 - 111 mmol/L 97  96  99   CO2 22 - 32 mmol/L 27  25  24    Calcium 8.9 - 10.3 mg/dL 8.2  8.2  8.5   Total Protein 6.5 - 8.1 g/dL 7.4  7.2  7.8   Total Bilirubin 0.0 - 1.2 mg/dL 0.5  0.7  0.9   Alkaline Phos 38 - 126 U/L 128  117  90   AST 15 - 41 U/L 43  116  63   ALT 0 - 44 U/L 41  55  26     US  PELVIC COMPLETE WITH TRANSVAGINAL Result Date: 08/14/2024 CLINICAL DATA:  [id number] Abnormal uterine and vaginal bleeding, unspecified [id number]. Last menstrual period 10 years ago. Postmenopausal EXAM: TRANSABDOMINAL AND TRANSVAGINAL ULTRASOUND OF PELVIS TECHNIQUE: Both transabdominal and transvaginal ultrasound examinations of the pelvis were performed. Transabdominal technique was performed for global imaging of the pelvis including uterus, ovaries, adnexal regions, and pelvic cul-de-sac. It was necessary to proceed with endovaginal exam following the transabdominal exam to visualize the endometrium and bilateral ovaries. COMPARISON:  CT abdomen pelvis 04/16/2022 FINDINGS: Uterus Measurements: 9.8 x 6 x 4 cm = volume: 121 mL. Posterior intramural uterine fibroid measuring up to 4.3 cm. Endometrium Thickness: 14 mm. Right ovary Not visualized. Left ovary Not visualized. Other findings No abnormal free fluid. IMPRESSION: 1. Thickened  endometrium in a postmenopausal patient. Recommend gynecologic consultation as underlying malignancy not excluded. 2. Intramural uterine fibroid. 3. Bilateral ovaries not visualized. Electronically Signed   By: Morgane  Naveau M.D.   On: 08/14/2024 19:19    ASSESSMENT/PLAN:  Assessment: Principal Problem:   RUQ abdominal pain Active Problems:   Hypertension   History of pulmonary embolism   Hepatic cyst   Lymphedema of both lower extremities   Morbid obesity with body mass index of 70 and over in adult [facility])   Intraabdominal fluid collection   Plan:  #Recurrent right upper quadrant abscess #Recurrent hepatic cysts #Hx of right abdominal abscess (2023) Sharp abdominal pain has slightly returned but does not have any associated shortness of breath.  Pain continues to be IR drain placement tomorrow. - continue zosyn  per ID - IR drain placement tomorrow, n.p.o. at midnight - continue tylenol  1000 mg q6h prn and oxycodone  5 mg q4h prn    #Dyspnea, resolved #Sinus tachycardia #Hx of DVT and PE on Warfarin Improved tachycardia but still intermittently tachycardic up to 110.  No return of dyspnea. -Continue warfarin with pharmacy consult (INR goal 2-3)  #Hx of candidemia  Negative fungal culture from cyst aspiration.   #AKI, resolved sCr back to baseline.   #HTN Stable off meds, normotensive here.    #Chronic Anemia, normocytic  #Hx of B12 deficiency  Hgb stable at 8.8.  No more blood passing from GU.  Abnormal uterine bleeding Uterine fibroid Thickened endometrium ParaGard placed in 2003 with unsuccessful removal in 2014.  CT venogram of the pelvis shows IUD still in place in 2020 and CT of the abdomen pelvis in 2023 does not but images are suboptimal.  Transvaginal ultrasound here with intramural uterine fibroid and thickened endometrium.  Still denies any symptoms of PID. - Continue with planned outpatient follow-up with OB/GYN for endometrial biopsy - If the patient is  to go to to the OR under general anesthesia OB/GYN requests to be notified as they may be able to evaluate and retrieve the IUD  #Hepatic steatosis #Morbid obesity Current and prior imaging showing hepatic steatosis. Elevated AST/ALT improving. - repeat CMP tomorrow   Best Practice: Diet: Cardiac diet IVF: Fluids: none VTE: Warfarin Code: Full  Disposition planning: Therapy Recs: Pending, DME: pending DISPO: Anticipated discharge in 1-2 days to Home pending clinical improvement and transition to oral antibiotics.  Signature: Fairy Pool, DO  Internal Medicine Resident, PGY-3 Please contact the on call pager at [phone] for any urgent or emergent needs. 6:31 AM 08/15/2024

## 2024-08-15 NOTE — Consult Note (Signed)
 WOC Nurse Consult Note: Reason for Consult: skin fold; abrasions Wound type: ICD; irritant contact dermatitis; intertriginous  Pressure Injury POA: NA Measurement: see nursing flow sheets Wound bed: see nursing flow sheets Drainage (amount, consistency, odor) see nursing flow sheets Periwound: intact  Dressing procedure/placement/frequency: Interdry for intertriginous skin irritation   Re consult if needed, will not follow at this time. Thanks  [employer]an[employer], RN,CWOCN, CNS, [employer] [phone]

## 2024-08-15 NOTE — Progress Notes (Signed)
 PHARMACY - ANTICOAGULATION CONSULT NOTE  Pharmacy Consult for warfarin dosing. Indication: Hx PE/DVT  Allergies  Allergen Reactions   Adhesive [Tape] Anaphylaxis and Swelling    *Adhesive Spray*     Patient Measurements: Height: 5' 4 (162.6 cm) Weight: (!) 263.5 kg (581 lb) IBW/kg (Calculated) : 54.7  Vital Signs: Temp: 98.6 F (37 C) (08/24 0814) Temp Source: Oral (08/24 0814) BP: 150/66 (08/24 0814) Pulse Rate: 99 (08/23 2320)  Labs: Recent Labs    08/13/24 0337 08/14/24 0338 08/15/24 0334  HGB 9.6* 9.0* 8.8*  HCT 32.3* 30.7* 30.3*  PLT 451* 456* 470*  LABPROT 33.1* 36.0* 32.8*  INR 3.1* 3.4* 3.0*  CREATININE 1.01* 0.78 0.87    Estimated Creatinine Clearance: 168.8 mL/min (by C-G formula based on SCr of 0.87 mg/dL).   Medical History: Past Medical History:  Diagnosis Date   Abdominal pain due to right abdominal and pelvic cystic mass 04/16/2022   Allergy    Anxiety    Candidemia (HCC) 04/18/2022   Depression    DVT (deep venous thrombosis) (HCC)    GERD (gastroesophageal reflux disease)    Hepatic cyst    Hypertension    Lymphedema of both lower extremities    Migraines    Morbid obesity with BMI of 70 and over, adult [facility])    PE (pulmonary thromboembolism) (HCC) 03/2019   Pneumonia 04/09/2022   Sleep apnea     Medications:  Scheduled:   Warfarin - Pharmacist Dosing Inpatient   Does not apply q1600    Assessment: Pt is a 48yoF with hx of PE and DVT on warfarin. Patient admitted for infected hepatic cyst evaluation and treatment. Prior to admission dosing of warfarin as follows: Warfarin 5 mg MWF, 2.5 mg on other days (last dose Wed 8/20 ~1800 hours documented in review of PTA medication history.)   INR down to 3 today. Plan for drain placement in AM. Since INR still in the upper range of goal, we will hold coumadin  today and re-dose post procedure tomorrow. It should likely be therapeutic in AM.   Goal of Therapy:  INR 2-3 Monitor platelets by  anticoagulation protocol: Yes   Plan:  No coumadin  today Monitor daily INR Monitor for signs/symptoms of bleeding, Hgb, and PLT.  Sergio Batch, PharmD, BCIDP, AAHIVP, CPP Infectious Disease Pharmacist 08/15/2024 10:30 AM

## 2024-08-15 NOTE — Plan of Care (Signed)

## 2024-08-16 ENCOUNTER — Inpatient Hospital Stay (HOSPITAL_COMMUNITY)

## 2024-08-16 ENCOUNTER — Encounter (HOSPITAL_COMMUNITY): Payer: Self-pay

## 2024-08-16 DIAGNOSIS — N938 Other specified abnormal uterine and vaginal bleeding: Secondary | ICD-10-CM

## 2024-08-16 DIAGNOSIS — K750 Abscess of liver: Secondary | ICD-10-CM

## 2024-08-16 DIAGNOSIS — Z6841 Body Mass Index (BMI) 40.0 and over, adult: Secondary | ICD-10-CM

## 2024-08-16 DIAGNOSIS — Z1611 Resistance to penicillins: Secondary | ICD-10-CM

## 2024-08-16 DIAGNOSIS — Z975 Presence of (intrauterine) contraceptive device: Secondary | ICD-10-CM

## 2024-08-16 DIAGNOSIS — D649 Anemia, unspecified: Secondary | ICD-10-CM

## 2024-08-16 DIAGNOSIS — B961 Klebsiella pneumoniae [K. pneumoniae] as the cause of diseases classified elsewhere: Secondary | ICD-10-CM

## 2024-08-16 DIAGNOSIS — E871 Hypo-osmolality and hyponatremia: Secondary | ICD-10-CM

## 2024-08-16 HISTORY — PX: IR PATIENT EVAL TECH 0-60 MINS: IMG5564

## 2024-08-16 LAB — COMPREHENSIVE METABOLIC PANEL WITH GFR
ALT: 31 U/L (ref 0–44)
AST: 29 U/L (ref 15–41)
Albumin: 1.7 g/dL — ABNORMAL LOW (ref 3.5–5.0)
Alkaline Phosphatase: 135 U/L — ABNORMAL HIGH (ref 38–126)
Anion gap: 8 (ref 5–15)
BUN: 10 mg/dL (ref 6–20)
CO2: 25 mmol/L (ref 22–32)
Calcium: 8.4 mg/dL — ABNORMAL LOW (ref 8.9–10.3)
Chloride: 98 mmol/L (ref 98–111)
Creatinine, Ser: 0.8 mg/dL (ref 0.44–1.00)
GFR, Estimated: >60 mL/min (ref >60)
Glucose, Bld: 156 mg/dL — ABNORMAL HIGH (ref 70–99)
Potassium: 4.3 mmol/L (ref 3.5–5.1)
Sodium: 131 mmol/L — ABNORMAL LOW (ref 135–145)
Total Bilirubin: 0.7 mg/dL (ref 0.0–1.2)
Total Protein: 7.4 g/dL (ref 6.5–8.1)

## 2024-08-16 LAB — BODY FLUID CULTURE W GRAM STAIN

## 2024-08-16 LAB — CBC
HCT: 29.2 % — ABNORMAL LOW (ref 36.0–46.0)
Hemoglobin: 8.7 g/dL — ABNORMAL LOW (ref 12.0–15.0)
MCH: 24.6 pg — ABNORMAL LOW (ref 26.0–34.0)
MCHC: 29.8 g/dL — ABNORMAL LOW (ref 30.0–36.0)
MCV: 82.7 fL (ref 80.0–100.0)
Platelets: 512 K/uL — ABNORMAL HIGH (ref 150–400)
RBC: 3.53 MIL/uL — ABNORMAL LOW (ref 3.87–5.11)
RDW: 16.6 % — ABNORMAL HIGH (ref 11.5–15.5)
WBC: 13.9 K/uL — ABNORMAL HIGH (ref 4.0–10.5)
nRBC: 0 % (ref 0.0–0.2)

## 2024-08-16 LAB — PROTIME-INR
INR: 2.8 — ABNORMAL HIGH (ref 0.8–1.2)
Prothrombin Time: 30.9 s — ABNORMAL HIGH (ref 11.4–15.2)

## 2024-08-16 LAB — FOLLICLE STIMULATING HORMONE: FSH: 0.7 m[IU]/mL

## 2024-08-16 MED ORDER — WARFARIN SODIUM 5 MG PO TABS
5.0000 mg | ORAL_TABLET | Freq: Once | ORAL | Status: AC
  Administered 2024-08-16: 5 mg via ORAL
  Filled 2024-08-16: qty 1

## 2024-08-16 MED ORDER — SODIUM CHLORIDE 0.9 % IV SOLN
3.0000 g | Freq: Four times a day (QID) | INTRAVENOUS | Status: DC
  Administered 2024-08-16 – 2024-08-17 (×5): 3 g via INTRAVENOUS
  Filled 2024-08-16 (×6): qty 8

## 2024-08-16 MED ORDER — LIDOCAINE 1 % OPTIME INJ - NO CHARGE
10.0000 mL | Freq: Once | INTRAMUSCULAR | Status: DC
  Filled 2024-08-16: qty 10

## 2024-08-16 MED ORDER — ALUM & MAG HYDROXIDE-SIMETH 200-200-20 MG/5ML PO SUSP
30.0000 mL | ORAL | Status: DC | PRN
  Administered 2024-08-16: 30 mL via ORAL
  Filled 2024-08-16: qty 30

## 2024-08-16 MED ORDER — SODIUM CHLORIDE 0.9% FLUSH
5.0000 mL | Freq: Three times a day (TID) | INTRAVENOUS | Status: DC
  Administered 2024-08-16 – 2024-08-19 (×10): 5 mL

## 2024-08-16 MED ORDER — MIDAZOLAM HCL 2 MG/2ML IJ SOLN
INTRAMUSCULAR | Status: AC
  Filled 2024-08-16: qty 2

## 2024-08-16 MED ORDER — FENTANYL CITRATE (PF) 100 MCG/2ML IJ SOLN
INTRAMUSCULAR | Status: AC
  Filled 2024-08-16: qty 2

## 2024-08-16 MED ORDER — VITAMIN B-12 100 MCG PO TABS
100.0000 ug | ORAL_TABLET | Freq: Every day | ORAL | Status: DC
  Administered 2024-08-16 – 2024-08-17 (×2): 100 ug via ORAL
  Filled 2024-08-16 (×2): qty 1

## 2024-08-16 MED ORDER — ENSURE PLUS HIGH PROTEIN PO LIQD: 237.0000 mL | Freq: Two times a day (BID) | ORAL | Status: DC

## 2024-08-16 MED ORDER — MIDAZOLAM HCL 2 MG/2ML IJ SOLN
INTRAMUSCULAR | Status: AC | PRN
  Administered 2024-08-16: 1 mg via INTRAVENOUS

## 2024-08-16 MED ORDER — FENTANYL CITRATE (PF) 100 MCG/2ML IJ SOLN
INTRAMUSCULAR | Status: AC | PRN
  Administered 2024-08-16: 25 ug via INTRAVENOUS

## 2024-08-16 NOTE — Plan of Care (Signed)

## 2024-08-16 NOTE — Plan of Care (Addendum)
  Problem: Education: Goal: Knowledge of General Education information will improve Description: Including pain rating scale, medication(s)/side effects and non-pharmacologic comfort measures Outcome: Progressing.    Problem: Clinical Measurements: Goal: Ability to maintain clinical measurements within normal limits will improve Outcome: Progressing Goal: Respiratory complications will improve Outcome: Progressing. Drain placement with high output slowing now.   Problem: Activity: Goal: Risk for activity intolerance will decrease Outcome: Progressing. Pt independent with ambulation after bed deflation.

## 2024-08-16 NOTE — Progress Notes (Signed)
 [facility]    Date of Admission:  08/12/2024     ID: Susan Warren is a 48 y.o. female with hepatic abscess Principal Problem:   RUQ abdominal pain Active Problems:   Hypertension   History of pulmonary embolism   Hepatic cyst   Lymphedema of both lower extremities   Morbid obesity with body mass index of 70 and over in adult [facility])   Intraabdominal fluid collection    Subjective: Continue to feel better no complaint otherwise   Medications:   diclofenac  Sodium  4 g Topical QID   lidocaine  1 %  10 mL Other Once   sodium chloride  flush  5 mL Intracatheter Q8H   vitamin B-12  100 mcg Oral Daily   Warfarin - Pharmacist Dosing Inpatient   Does not apply q1600    Objective: Vital signs in last 24 hours: Temp:  [97.7 F (36.5 C)-98.6 F (37 C)] 97.7 F (36.5 C) (08/25 1935) Pulse Rate:  [92-100] 100 (08/25 1739) Resp:  [15-27] 20 (08/25 1935) BP: (107-134)/(56-87) 120/56 (08/25 1935) SpO2:  [90 %-99 %] 97 % (08/25 1935)  Physical Exam  Constitutional:  oriented to person, place, and time. appears well-developed and well-nourished. No distress.  HENT: [place]/AT, PERRLA, no scleral icterus Mouth/Throat: Oropharynx is clear and moist. No oropharyngeal exudate.  Cardiovascular: Normal rate, regular rhythm and normal heart sounds. Exam reveals no gallop and no friction rub.  No murmur heard.  Pulmonary/Chest: Effort normal and breath sounds normal. No respiratory distress.  has no wheezes.  Neck = supple, no nuchal rigidity Abdominal: Soft. Neurological: alert and oriented to person, place, and time.  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  C-Reactive Protein No results for input(s): CRP in the last 72 hours.   Microbiology:  Studies/Results: IR PATIENT EVAL TECH 0-60 MINS Result Date: 08/16/2024 Leigh Andrea LABOR, RT     08/16/2024  4:27 PM Tech called to bedside due to bag frequently being filled. Bag was in the process of being emptied when I arrived. Changed gravity drain bag to foley bag and added a Y adapter to flush. Drain was flushed twice and not much coming out now, Dr Vanice Aware.     CT GUIDED PERITONEAL/RETROPERITONEAL FLUID DRAIN BY PERC CATH Result Date: 08/16/2024 INDICATION: RECURRENT CHRONIC LARGE EXOPHYTIC INFECTED HEPATIC CYST EXAM: CT DRAINAGE OF THE RECURRENT INFERIOR INFECTED SUBHEPATIC CYST MEDICATIONS: The patient is currently admitted to the hospital and receiving intravenous antibiotics. The antibiotics were administered within an appropriate time frame prior to the initiation of the procedure. ANESTHESIA/SEDATION: Moderate (conscious) sedation was employed during this procedure. A total of Versed  1.0 mg and Fentanyl  25 mcg was administered intravenously by the radiology nurse. Total intra-service moderate Sedation Time: 11 minutes. The patient's level of consciousness and vital signs were monitored continuously by radiology nursing  throughout the procedure under my direct supervision. COMPLICATIONS: None immediate. PROCEDURE: Informed written consent was obtained from the patient after a thorough discussion of the procedural risks, benefits and alternatives. All questions were addressed. Maximal Sterile Barrier Technique was utilized including caps, mask, sterile gowns, sterile gloves, sterile drape, hand hygiene and skin antiseptic. A timeout was performed prior to the initiation of the procedure. Previous imaging  reviewed. Patient positioned supine. Noncontrast localization CT performed. The chronic subhepatic exophytic infected cyst was localized and marked for drainage for an anterior approach. Under sterile conditions and local anesthesia, the 18 gauge 10 cm access needle was advanced from a right anterior abdominal approach. Needle position confirmed with CT. Guidewire inserted followed by tract dilatation to insert a 12 [place] drain. Drain catheter position confirmed with CT. Syringe aspiration yielded dark brown blood tinged fluid. Catheter secured with silk suture and a sterile dressing. Gravity drainage bag connected because of the large size. Once the output decreases, gravity bag can be switched to suction bulb. No immediate complication. Patient tolerated the procedure well. IMPRESSION: Successful CT-guided drainage of the infected large subhepatic chronic cyst. Electronically Signed   By: CHRISTELLA.  Shick M.D.   On: 08/16/2024 12:30     Assessment/Plan: 48yo F with with recurrent hepatic abscess (previous episode in 2023) now has  A thick-walled cystic lesion is seen involving the liver. This currently measures 17.5 x 15.0 x 17.6 cm, compared to 25.7 x 23.7 x 23.0 cm  s/p 5 L aspirated by IR ; culture gram stain showing GNR, now identified as klebsiella, but still reincubating  8/21 ir aspirate cx of abscess kleb pna (R amp; S amp-sulb; ceftriaxone ; cipro; flagyl ) 8/21 bcx negative   She underwent IR perc abscess drain 8/25  -change abx to amp-sulb and can keep while inhouse -will plan 4 weeks iv abx with ertapenem at discharge to finish 4 weeks starting 8/25, and transition to augmentin  afterward -plan to repeat abd pelv ct in around 3 - 4 weeks -opat as below -maintain standard isolation precaution -id will sign off    OPAT Orders Discharge antibiotics to be given via PICC line Discharge antibiotics: Ertapenem   Duration: 4 weeks iv abx ertapenem from 8/25 End Date: 09/13/24  Would  start augmentin  on 09/14/24  [facility] Care Per Protocol:  Home health RN for IV administration and teaching; PICC line care and labs.    Labs weekly while on IV antibiotics: _x_ CBC with differential __ BMP _x_ CMP _x_ CRP _x_ ESR __ Vancomycin trough __ CK  _x_ Please pull PIC at completion of IV antibiotics __ Please leave PIC in place until doctor has seen patient or been notified  Fax weekly labs to [phone]  Clinic Follow Up Appt: 9/15 @ 945  @  [facility] [address], [place], [place] [address] Phone: [phone]    Constance DASEN [facility] for Infectious Diseases Pager: [phone]  08/16/2024, 11:04 PM

## 2024-08-16 NOTE — Procedures (Signed)
 Interventional Radiology Procedure Note  Procedure: CT DRAIN ABD ABSCESS    Complications: None  Estimated Blood Loss:  0  Findings: DARK BROWN BLOOD TINGED FLUID GRAVITY BAG DUE TO LARGE SIZE AND ONCE OUTPUT DECREASES CAN SWITCH TO SUCTION BULB    EMERSON FREDERIC SPECKING, MD

## 2024-08-16 NOTE — Progress Notes (Signed)
 Physical Therapy Treatment Patient Details Name: Susan Warren MRN: [id number] DOB: Jan 11, 1976 Today's Date: 08/16/2024   History of Present Illness 48 yo female admitted with R abdominal pain with 3 days of pain and nausea. Admitted for hepatic cyst leading to sub hepatic fluid collection. PMH HTN, hx of PE/ DVT on warfarin, obese, hx of R complex abdominal cyst, candidemia, lymphedema BLE ( untreated)    PT Comments  Pt slowly progressing towards goals. Limited today by pain/fatigue from procedure earlier in the day. Pt currently Min A for bed mobility, Mod I for sit to stand and supervision for short distance gait. Pt requires unilateral UE support intermittently during gait due to balance deficits. Due to pt current functional status, home set up and available assistance at home recommending skilled physical therapy services 3x/week in order to address strength, balance and functional mobility to decrease risk for falls, injury and re-hospitalization.       If plan is discharge home, recommend the following: Help with stairs or ramp for entrance     Equipment Recommendations  None recommended by PT       Precautions / Restrictions Precautions Precautions: None Recall of Precautions/Restrictions: Intact Restrictions Weight Bearing Restrictions Per Provider Order: No     Mobility  Bed Mobility   Bed Mobility: Sit to Supine       Sit to supine: Min assist   General bed mobility comments: arrived pt sitting on toilet., Min A for RLE to bed.    Transfers Overall transfer level: Modified independent Equipment used: None   General transfer comment: pt stabilizes herself on the grab bar and sink with wide BOS    Ambulation/Gait Ambulation/Gait assistance: Supervision Gait Distance (Feet): 15 Feet Assistive device: None Gait Pattern/deviations: Step-through pattern, Wide base of support Gait velocity: Decreased Gait velocity interpretation: <1.31 ft/sec, indicative of  household ambulator   General Gait Details: use of UE on sink in room and bed. Improved gait this session with less guarding. limited due to fatigue and soreness in the abdomen/LB secondary to recent procedure.     Balance Overall balance assessment: Needs assistance Sitting-balance support: Single extremity supported Sitting balance-Leahy Scale: Normal Sitting balance - Comments: sitting on toilet and EOB.   Standing balance support: Single extremity supported, During functional activity Standing balance-Leahy Scale: Fair Standing balance comment: no overt LOB but requires unilateral UE support intermittently     Communication Communication Communication: No apparent difficulties  Cognition Arousal: Alert Behavior During Therapy: WFL for tasks assessed/performed   PT - Cognitive impairments: No apparent impairments     Following commands: Intact      Cueing Cueing Techniques: Verbal cues     General Comments General comments (skin integrity, edema, etc.): Max HR 120 this session      Pertinent Vitals/Pain Pain Assessment Pain Assessment: 0-10 Pain Score: 6  Pain Location: sore in abdomen, low back Pain Descriptors / Indicators: Sore Pain Intervention(s): Monitored during session, Limited activity within patient's tolerance     PT Goals (current goals can now be found in the care plan section) Acute Rehab PT Goals Patient Stated Goal: to improve mobility and get fluid off PT Goal Formulation: With patient Time For Goal Achievement: 08/30/24 Potential to Achieve Goals: Good    Frequency    Min 2X/week      PT Plan  Continue with current POC        AM-PAC PT 6 Clicks Mobility   Outcome Measure  Help needed turning from your  back to your side while in a flat bed without using bedrails?: None Help needed moving from lying on your back to sitting on the side of a flat bed without using bedrails?: None Help needed moving to and from a bed to a chair  (including a wheelchair)?: None Help needed standing up from a chair using your arms (e.g., wheelchair or bedside chair)?: None Help needed to walk in hospital room?: A Little Help needed climbing 3-5 steps with a railing? : A Little 6 Click Score: 22    End of Session   Activity Tolerance: Patient tolerated treatment well Patient left: in bed;with call bell/phone within reach Nurse Communication: Mobility status PT Visit Diagnosis: Other abnormalities of gait and mobility (R26.89)     Time: 8575-8552 PT Time Calculation (min) (ACUTE ONLY): 23 min  Charges:    $Therapeutic Activity: 23-37 mins PT General Charges $$ ACUTE PT VISIT: 1 Visit                     Dorothyann Maier, DPT, CLT  Acute Rehabilitation Services Office: [phone] (Secure chat preferred)    Dorothyann VEAR Maier 08/16/2024, 2:55 PM

## 2024-08-16 NOTE — Progress Notes (Signed)
 Pharmacy Antibiotic Note  Susan Warren is a 48 y.o. female admitted on 08/12/2024 with Intra-abdominal Infection. She was started on empiric unasyn  which was then broadened to zosyn  while awaiting culture susceptibilities for GNR in her abdominal fluid culture. With culture susceptibilities resulted, she is being narrowed back to unasyn .  Plan: -Discontinue Zosyn  -Start Unasyn  3g every 6 hours -Monitor renal function and clinical status  Height: 5' 4 (162.6 cm) Weight: (!) 263.5 kg (581 lb) IBW/kg (Calculated) : 54.7  Temp (24hrs), Avg:98.5 F (36.9 C), Min:98.3 F (36.8 C), Max:99 F (37.2 C)  Recent Labs  Lab 08/12/24 0657 08/12/24 0658 08/12/24 1941 08/12/24 2205 08/13/24 0337 08/14/24 0338 08/15/24 0334 08/16/24 0353  WBC  --  14.2*  --   --  14.1* 11.8* 13.2* 13.9*  CREATININE  --  1.10*  --   --  1.01* 0.78 0.87 0.80  LATICACIDVEN 2.0*  --  2.5* 1.5  --   --   --   --     Estimated Creatinine Clearance: 183.5 mL/min (by C-G formula based on SCr of 0.8 mg/dL).    Allergies  Allergen Reactions   Adhesive [Tape] Anaphylaxis and Swelling    *Adhesive Spray*     Antimicrobials this admission: Fluconazole  8/21 >> 8/21 Unasyn  8/21 >> 8/22 Zosyn  8/22 >> 8/25 Unasyn  8/25 >>  Microbiology results: 8/21 BCx: No growth at 4 days 8/21 Abdomen fluid: klebsiella pneumoniae 8/21 Abdomen fluid fungus: no fungus at 3 days   Thank you for allowing pharmacy to be involved with this patient's care.  Mendel Barter, PharmD PGY1 Clinical Pharmacist [facility]  08/16/2024 11:13 AM

## 2024-08-16 NOTE — Progress Notes (Incomplete)
 Zosyn  >> Unasyn 

## 2024-08-16 NOTE — Progress Notes (Signed)
 HD#3 SUBJECTIVE:  Patient Summary: Susan Warren is a 48 y.o. with a pertinent PMH of PE and DVT on warfarin, complex right abdominal cyst, candidemia (2023), HTN, morbid obesity, who presented with SOB and RUQ abdominal pain and admitted for hepatic cyst leading to subhepatic fluid collection.   Overnight Events: None  Interim History:  Has had RUQ pain on and off. No fever, URI symptoms, SOB, hemoptysis. Denies dysuria. She does note some blood after urination.  OBJECTIVE:  Vital Signs: Vitals:   08/15/24 1314 08/15/24 1612 08/15/24 1939 08/16/24 0418  BP: 135/61 128/73 112/68 107/73  Pulse:   96 94  Resp: 20 17 20 20   Temp: 98.3 F (36.8 C) 98.5 F (36.9 C) 99 F (37.2 C) 98.6 F (37 C)  TempSrc: Oral Oral Oral Oral  SpO2:  96% 90% 95%  Weight:      Height:       Supplemental O2: Room Air SpO2: 95 %  Filed Weights   08/12/24 0549  Weight: (!) 263.5 kg     Intake/Output Summary (Last 24 hours) at 08/16/2024 0709 Last data filed at 08/16/2024 0543 Gross per 24 hour  Intake 560.4 ml  Output --  Net 560.4 ml   Net IO Since Admission: 4,138.01 mL [08/16/24 0709]  Physical Exam: Constitutional: well-appearing, morbidly obese, sitting up in hospital bed in no acute distress HENT: normocephalic atraumatic, mucous membranes moist Eyes: conjunctiva non-erythematous, PERRL, no scleral icterus Cardiovascular: palpable pulses Pulmonary/Chest: normal work of breathing on room air Abdominal: soft, non-tender, non-distended, bowel sounds normal; no purpura or other signs of bruising noted at prior drainage site. MSK: normal bulk and tone Neurological: alert & oriented x3, Skin: warm and dry; several excoriations on abdomen. Extremities: no cyanosis present; significant BLE edema present; peripheral pulses intact Psych: normal mood and affect, thought content normal   Patient Lines/Drains/Airways Status     Active Line/Drains/Airways     Name Placement date  Placement time Site Days   Peripheral IV 08/12/24 20 G 2.5 Anterior;Right Forearm 08/12/24  0647  Forearm  1   External Urinary Catheter 04/16/22  2300  --  850            Pertinent labs and imaging:      Latest Ref Rng & Units 08/16/2024    3:53 AM 08/15/2024    3:34 AM 08/14/2024    3:38 AM  CBC  WBC 4.0 - 10.5 K/uL 13.9  13.2  11.8   Hemoglobin 12.0 - 15.0 g/dL 8.7  8.8  9.0   Hematocrit 36.0 - 46.0 % 29.2  30.3  30.7   Platelets 150 - 400 K/uL 512  470  456        Latest Ref Rng & Units 08/16/2024    3:53 AM 08/15/2024    3:34 AM 08/14/2024    3:38 AM  CMP  Glucose 70 - 99 mg/dL 843  828  823   BUN 6 - 20 mg/dL 10  13  14    Creatinine 0.44 - 1.00 mg/dL 9.19  9.12  9.21   Sodium 135 - 145 mmol/L 131  133  132   Potassium 3.5 - 5.1 mmol/L 4.3  4.6  4.1   Chloride 98 - 111 mmol/L 98  97  96   CO2 22 - 32 mmol/L 25  27  25    Calcium 8.9 - 10.3 mg/dL 8.4  8.2  8.2   Total Protein 6.5 - 8.1 g/dL 7.4  7.4  7.2   Total Bilirubin 0.0 - 1.2 mg/dL 0.7  0.5  0.7   Alkaline Phos 38 - 126 U/L 135  128  117   AST 15 - 41 U/L 29  43  116   ALT 0 - 44 U/L 31  41  55     No results found.   ASSESSMENT/PLAN:  Assessment: Principal Problem:   RUQ abdominal pain Active Problems:   Hypertension   History of pulmonary embolism   Hepatic cyst   Lymphedema of both lower extremities   Morbid obesity with body mass index of 70 and over in adult [facility])   Intraabdominal fluid collection  Patient Summary: NOLIE BIGNELL is a 48 y.o. with a pertinent PMH of PE and DVT on warfarin, complex right abdominal cyst, candidemia (2023), HTN, morbid obesity, who presented with SOB and RUQ abdominal pain and admitted for subhepatic abscess.   Plan:  #Recurrent subhepatic abscess #Recurrent hepatic cysts #Hx of right abdominal abscess (2023) Abdominal pain initially resolved after first drainage, but returned as more fluid accumulated. No pain when we evaluated patient at bedside today. Plan  is for IR to place drain today. Initially drained 5L from 2.6 x 2.3 x 2.3 cm fluid collection growing Klebsiella only resistant to ampicillin . Consulted ID for assistance regarding antimicrobial coverage. Switched to zosyn  from Unasyn . Question is regarding PO antibiotic choice as she is on warfarin and requires close monitoring of INR. Plan to switch to PO bactrim tomorrow. -ID recs:  -continue zosyn  (i8/22 - ) -discontinue ampicillin -sulbactam (i8/21/25 - 08/13/24)  -stop antifungals; avoid due to supratherapeutic INR -IR recs:  -s/p drainage of 5L on 08/12/24  -drain placement today -Pain Regimen:  -Tylenol  1000 mg Q6H PRN -Oxycodone  5 mg Q4H PRN moderate/severe   #Dyspnea, resolved #Sinus tachycardia #Hx of DVT and PE on Warfarin She initially presented with dyspnea but this has since resolved. Has been taking warfarin as prescribed with therapeutic INR. D-dimer 3.88. Unable to obtain CT or V/Q imaging due to body habitus. TTE shows normal EF and no right heart strain. Wells score of at least 3 but she is adherent to warfarin with INR of 2.8 in goal range. Low suspicion for PE given adequate warfarin use. -Echo: Normal EF, no right heart strain -Continue telemetry -Continue warfarin with pharmacy consult (INR goal 2-3)  #Abnormal uterine bleeding #Uterine fibroid #Thickened endometrium Today patient reports still having occasional blood with toilet use. ParaGard placed in 2003 with unsuccessful removal in 2014. CT venogram of the pelvis shows IUD still in place in 2020 and CT of the abdomen pelvis in 2023 does not but images are suboptimal. OB/GYN consulted for vaginal bleeding. Transvaginal ultrasound here with intramural uterine fibroid and thickened endometrium. Still denies any symptoms of PID. -Continue with planned outpatient follow-up with OB/GYN for endometrial biopsy -If the patient is to go to to the OR under general anesthesia OB/GYN requests to be notified as they may be able  to evaluate and retrieve the IUD  #Chronic Anemia, normocytic  #Hx of B12 deficiency  Vaginal bleeding could be cause of anemia. Hgb 8.7, appears stable. Baseline around 10-11. Denies any acute signs of bleeding while on Warfarin. Last B12 low 180. B12 during this admission of 185. Not on supplementation per patient.   -Trend CBC and signs of bleed -Oral vitamin B12 supplementation after drain placed  #Mild hyponatremia Na consistently down-trending from 137 on admission to 131 today. Patient is currently asymptomatic. Could be in the setting  of chronic peripheral edema or malnutrition given low albumin. Will continue to trend. -continue to monitor  #Hx of candidemia  04/16/22 Bcx grew Candida parapsilosis and treated with micafungin  then fluconazole  for 14 days. TEE w/o vegetations. Repeat Bcx negative. WBC 14.2. Recent treatment of sinus infection per patient. Denies any new cough or dysuria. Denies any acute skin infections. Fungal culture from aspiration is negative. ID consulted and recommended holding off on antifungals d/t findings of gram negative rods on stain of abdominal fluid collection.   #AKI, resolved Scr 1.10 on admission, now back to baseline of 0.6-0.8. Likely pre-renal. She received a couple LR boluses. -Trend BMP   #HTN Notes hx of this but is normotensive. No longer on antihypertensives due to insurance change for several months.   #Hepatic steatosis #Morbid obesity LFTs elevated after first drainage, likely due to inflammatory changes s/p IR drainage of hepatic cyst. Today back to normal. She has current and prior imaging showing hepatic steatosis which is likely related to [facility] given significantly elevated BMI. -trend LFTs  Best Practice: Diet: Cardiac diet IVF: Fluids: none VTE: Warfarin Code: Full  Disposition planning: Therapy Recs: Pending, DME: pending DISPO: Anticipated discharge in 1-2 days to Home pending clinical improvement.  Signature:  Letha Cheadle, MD [facility]  7:09 AM, 08/16/2024  On Call pager [phone]

## 2024-08-16 NOTE — Procedures (Signed)
  Tech called to bedside due to bag frequently being filled. Bag was in the process of being emptied when I arrived. Changed gravity drain bag to foley bag and added a Y adapter to flush. Drain was flushed twice and not much coming out now, Dr Vanice Aware.

## 2024-08-16 NOTE — Progress Notes (Signed)
 Tech called to bedside due to bag frequently being filled. Bag was in the process of being emptied when I arrived. Changed gravity drain bag to foley bag and added a Y adapter to flush. Drain was flushed twice and not much coming out now, Dr Vanice Aware.

## 2024-08-16 NOTE — Progress Notes (Signed)
 PHARMACY - ANTICOAGULATION CONSULT NOTE  Pharmacy Consult for warfarin dosing.  Indication: VTE prophylaxis  Allergies  Allergen Reactions   Adhesive [Tape] Anaphylaxis and Swelling    *Adhesive Spray*     Patient Measurements: Height: 5' 4 (162.6 cm) Weight: (!) 263.5 kg (581 lb) IBW/kg (Calculated) : 54.7  Vital Signs: Temp: 98.3 F (36.8 C) (08/25 0757) Temp Source: Oral (08/25 0757) BP: 122/72 (08/25 0757) Pulse Rate: 93 (08/25 0757)  Labs: Recent Labs    08/14/24 0338 08/15/24 0334 08/16/24 0353  HGB 9.0* 8.8* 8.7*  HCT 30.7* 30.3* 29.2*  PLT 456* 470* 512*  LABPROT 36.0* 32.8* 30.9*  INR 3.4* 3.0* 2.8*  CREATININE 0.78 0.87 0.80    Estimated Creatinine Clearance: 183.5 mL/min (by C-G formula based on SCr of 0.8 mg/dL).   Medical History: Past Medical History:  Diagnosis Date   Abdominal pain due to right abdominal and pelvic cystic mass 04/16/2022   Allergy    Anxiety    Candidemia (HCC) 04/18/2022   Depression    DVT (deep venous thrombosis) (HCC)    GERD (gastroesophageal reflux disease)    Hepatic cyst    Hypertension    Lymphedema of both lower extremities    Migraines    Morbid obesity with BMI of 70 and over, adult [facility])    PE (pulmonary thromboembolism) (HCC) 03/2019   Pneumonia 04/09/2022   Sleep apnea     Medications:  Scheduled:   diclofenac  Sodium  4 g Topical QID   vitamin B-12  100 mcg Oral Daily   Warfarin - Pharmacist Dosing Inpatient   Does not apply q1600    Assessment: INR this morning at 0353 hours was 2.8, doses for the past two days having been held 2/2 hypoprothrombinemic responsiveness on her prior to admission regimen of 5 mg Mondays, Wednesdays and Fridays; 2.5 mg all other days.  Goal of Therapy:  INR 2-3 Monitor platelets by anticoagulation protocol: Yes   Plan:  Will resume her PTA dosing regimen, 5 mg warfarin by mouth today at ~1600 hours s/p IR procedure adjudicated as low bleeding risk, permitting resumption  of warfarin this afternoon status-post procedure.   Lynwood KATHEE Lites, PharmD, CPP Clinical Pharmacist Practitioner 08/16/2024,10:10 AM

## 2024-08-16 NOTE — Progress Notes (Signed)
 OT Cancellation Note  Patient Details Name: Susan Warren MRN: [id number] DOB: 07-Oct-1976   Cancelled Treatment:    Reason Eval/Treat Not Completed: Patient at procedure or test/ unavailable  Warrick POUR OTR/L  Acute Rehab Services  [phone] office number  Warrick Berber 08/16/2024, 12:00 PM

## 2024-08-17 ENCOUNTER — Other Ambulatory Visit: Payer: Self-pay

## 2024-08-17 ENCOUNTER — Other Ambulatory Visit (HOSPITAL_COMMUNITY): Payer: Self-pay

## 2024-08-17 DIAGNOSIS — Z792 Long term (current) use of antibiotics: Secondary | ICD-10-CM

## 2024-08-17 DIAGNOSIS — Z9889 Other specified postprocedural states: Secondary | ICD-10-CM

## 2024-08-17 LAB — CBC WITH DIFFERENTIAL/PLATELET
Abs Immature Granulocytes: 0.48 K/uL — ABNORMAL HIGH (ref 0.00–0.07)
Basophils Absolute: 0 K/uL (ref 0.0–0.1)
Basophils Relative: 0 %
Eosinophils Absolute: 0.2 K/uL (ref 0.0–0.5)
Eosinophils Relative: 2 %
HCT: 29.9 % — ABNORMAL LOW (ref 36.0–46.0)
Hemoglobin: 8.8 g/dL — ABNORMAL LOW (ref 12.0–15.0)
Immature Granulocytes: 4 %
Lymphocytes Relative: 10 %
Lymphs Abs: 1.2 K/uL (ref 0.7–4.0)
MCH: 24.1 pg — ABNORMAL LOW (ref 26.0–34.0)
MCHC: 29.4 g/dL — ABNORMAL LOW (ref 30.0–36.0)
MCV: 81.9 fL (ref 80.0–100.0)
Monocytes Absolute: 0.7 K/uL (ref 0.1–1.0)
Monocytes Relative: 6 %
Neutro Abs: 10.1 K/uL — ABNORMAL HIGH (ref 1.7–7.7)
Neutrophils Relative %: 78 %
Platelets: 539 K/uL — ABNORMAL HIGH (ref 150–400)
RBC: 3.65 MIL/uL — ABNORMAL LOW (ref 3.87–5.11)
RDW: 16.5 % — ABNORMAL HIGH (ref 11.5–15.5)
WBC: 12.7 K/uL — ABNORMAL HIGH (ref 4.0–10.5)
nRBC: 0.2 % (ref 0.0–0.2)

## 2024-08-17 LAB — BASIC METABOLIC PANEL WITH GFR
Anion gap: 10 (ref 5–15)
BUN: 9 mg/dL (ref 6–20)
CO2: 25 mmol/L (ref 22–32)
Calcium: 8.2 mg/dL — ABNORMAL LOW (ref 8.9–10.3)
Chloride: 96 mmol/L — ABNORMAL LOW (ref 98–111)
Creatinine, Ser: 0.62 mg/dL (ref 0.44–1.00)
GFR, Estimated: >60 mL/min (ref >60)
Glucose, Bld: 228 mg/dL — ABNORMAL HIGH (ref 70–99)
Potassium: 4.5 mmol/L (ref 3.5–5.1)
Sodium: 131 mmol/L — ABNORMAL LOW (ref 135–145)

## 2024-08-17 LAB — PROTIME-INR
INR: 3.2 — ABNORMAL HIGH (ref 0.8–1.2)
Prothrombin Time: 33.8 s — ABNORMAL HIGH (ref 11.4–15.2)

## 2024-08-17 LAB — CYTOLOGY - NON PAP

## 2024-08-17 LAB — GLUCOSE, CAPILLARY
Glucose-Capillary: 219 mg/dL — ABNORMAL HIGH (ref 70–99)
Glucose-Capillary: 204 mg/dL — ABNORMAL HIGH (ref 70–99)
Glucose-Capillary: 193 mg/dL — ABNORMAL HIGH (ref 70–99)

## 2024-08-17 LAB — CULTURE, BLOOD (ROUTINE X 2): Culture: NO GROWTH

## 2024-08-17 LAB — HEMOGLOBIN A1C
Hgb A1c MFr Bld: 6.9 % — ABNORMAL HIGH (ref 4.8–5.6)
Mean Plasma Glucose: 151.33 mg/dL

## 2024-08-17 MED ORDER — METRONIDAZOLE 500 MG PO TABS
500.0000 mg | ORAL_TABLET | Freq: Two times a day (BID) | ORAL | Status: DC
  Administered 2024-08-17 – 2024-08-19 (×4): 500 mg via ORAL
  Filled 2024-08-17 (×4): qty 1

## 2024-08-17 MED ORDER — INSULIN ASPART 100 UNIT/ML IJ SOLN
0.0000 [IU] | Freq: Three times a day (TID) | INTRAMUSCULAR | Status: DC
  Administered 2024-08-17 (×2): 7 [IU] via SUBCUTANEOUS
  Administered 2024-08-18: 4 [IU] via SUBCUTANEOUS
  Administered 2024-08-18: 3 [IU] via SUBCUTANEOUS
  Administered 2024-08-18: 7 [IU] via SUBCUTANEOUS
  Administered 2024-08-19 (×2): 4 [IU] via SUBCUTANEOUS

## 2024-08-17 MED ORDER — WARFARIN SODIUM 2.5 MG PO TABS: 2.5000 mg | ORAL_TABLET | Freq: Once | ORAL | Status: DC

## 2024-08-17 MED ORDER — INSULIN ASPART 100 UNIT/ML IJ SOLN
0.0000 [IU] | Freq: Every day | INTRAMUSCULAR | Status: DC
  Administered 2024-08-18: 2 [IU] via SUBCUTANEOUS

## 2024-08-17 MED ORDER — RIVAROXABAN 20 MG PO TABS
20.0000 mg | ORAL_TABLET | Freq: Every day | ORAL | Status: DC
  Administered 2024-08-17 – 2024-08-18 (×2): 20 mg via ORAL
  Filled 2024-08-17 (×2): qty 1

## 2024-08-17 MED ORDER — SODIUM CHLORIDE 0.9 % IV SOLN
2.0000 g | Freq: Every day | INTRAVENOUS | Status: DC
  Administered 2024-08-17 – 2024-08-19 (×3): 2 g via INTRAVENOUS
  Filled 2024-08-17 (×3): qty 20

## 2024-08-17 MED ORDER — VITAMIN B-12 1000 MCG PO TABS
1000.0000 ug | ORAL_TABLET | Freq: Every day | ORAL | Status: DC
  Administered 2024-08-18 – 2024-08-19 (×2): 1000 ug via ORAL
  Filled 2024-08-17 (×2): qty 1

## 2024-08-17 MED ORDER — SODIUM CHLORIDE FLUSH 0.9 % IV SOLN
10.0000 mL | INTRAVENOUS | 2 refills | Status: DC | PRN
  Filled 2024-08-17: qty 300, 30d supply, fill #0

## 2024-08-17 NOTE — Progress Notes (Signed)
 Referring Provider(s): Dr. Elsie Savannah  Supervising Physician: Jenna Hacker  Patient Status:  [facility] - In-pt  Chief Complaint: Recurrent abdominal cystic fluid collection s/p drain placement on 8/25 with Dr. ONEIDA Specking  Brief History:  48 year old female with a history of PE and DVT on warfarin, morbid obesity, candidemia, and complex right abdominal cyst who presented with SOB and RUQ pain that she described as a circumferential stabbing sensation primarily in the upper abdomen. She was admitted for recurrent RUQ cyst concerning for abscess. Dr. Specking previously performed aspiration of 5L on 8/22 w/ symptomatic improvement; however US  on 8/22 showed persistent fluid collection and culture grew klebsiella pneumoniae (neg blood cultures), leading to IR consult for drain placement which patient underwent on 8/25 with Dr. ONEIDA Specking   Subjective:  Patient sitting upright in bed. Reports feeling much better. Admits to mild abdominal tenderness around drain site. Tolerating diet well. Likely discharge tomorrow pending start of antibiotics.   Allergies: Adhesive [tape]  Medications: Prior to Admission medications   Medication Sig Start Date End Date Taking? Authorizing Provider  acetaminophen  (TYLENOL ) 325 MG tablet Take 2 tablets (650 mg total) by mouth every 6 (six) hours as needed for mild pain (or Fever >/= 101). 04/11/22  Yes Elgergawy, Brayton RAMAN, MD  amoxicillin -clavulanate (AUGMENTIN ) 875-125 MG tablet Take 1 tablet by mouth every 12 (twelve) hours. 08/08/24  Yes [provider]  cyanocobalamin  ([employer] VITAMIN B12) 1000 MCG tablet Take 1 tablet (1,000 mcg total) by mouth daily. 01/28/23  Yes Brien Belvie BRAVO, MD  warfarin (COUMADIN ) 5 MG tablet Take 1 tablet (5mg  total) on Mondays, Wednesdays, and Fridays with 1/2 tablet (2.5mg ) all other days. 06/01/24  Yes Brien Belvie BRAVO, MD     Vital Signs: BP (!) 151/60 (BP Location: Right Arm)   Pulse 96   Temp 98.1 F (36.7 C) (Oral)    Resp (!) 24   Ht 5' 4 (1.626 m)   Wt (!) 581 lb (263.5 kg)   SpO2 94%   BMI 99.73 kg/m   Physical Exam Constitutional:      Appearance: She is obese.  Cardiovascular:     Rate and Rhythm: Normal rate.  Pulmonary:     Effort: Pulmonary effort is normal.  Abdominal:     General: Abdomen is flat.     Palpations: Abdomen is soft.     Tenderness: There is abdominal tenderness (appropriately tender around drain site).     Comments: RUQ drain sutured in place and attached to foley drainage bag w/ ~20cc dark brown/blood tinged fluid. Overlying dressing just changed and clean/dry/intact. Flushes easily.    Skin:    General: Skin is warm and dry.  Neurological:     Mental Status: She is alert.     Drain Location: periumbilical  Size: Fr size: 12 Fr Date of placement: 08/16/24  Currently to: Foley Bag/Gravity 24 hour output:  Output by Drain (mL) 08/15/24 0701 - 08/15/24 1900 08/15/24 1901 - 08/16/24 0700 08/16/24 0701 - 08/16/24 1900 08/16/24 1901 - 08/17/24 0700 08/17/24 0701 - 08/17/24 1101  Closed System Drain 1 Superior;Midline Abdomen Bulb (JP) 12 Fr.   3250      Current examination: Flushes/aspirates easily.  Insertion site unremarkable. Suture and stat lock in place. Dressed appropriately.    Labs:  CBC: Recent Labs    08/14/24 0338 08/15/24 0334 08/16/24 0353 08/17/24 0411  WBC 11.8* 13.2* 13.9* 12.7*  HGB 9.0* 8.8* 8.7* 8.8*  HCT 30.7* 30.3* 29.2* 29.9*  PLT 456* 470* 512* 539*    COAGS: Recent Labs    08/14/24 0338 08/15/24 0334 08/16/24 0353 08/17/24 0411  INR 3.4* 3.0* 2.8* 3.2*    BMP: Recent Labs    08/14/24 0338 08/15/24 0334 08/16/24 0353 08/17/24 0411  NA 132* 133* 131* 131*  K 4.1 4.6 4.3 4.5  CL 96* 97* 98 96*  CO2 25 27 25 25   GLUCOSE 176* 171* 156* 228*  BUN 14 13 10 9   CALCIUM 8.2* 8.2* 8.4* 8.2*  CREATININE 0.78 0.87 0.80 0.62  GFRNONAA >60 >60 >60 >60    LIVER FUNCTION TESTS: Recent Labs    08/13/24 0337  08/14/24 0338 08/15/24 0334 08/16/24 0353  BILITOT 0.9 0.7 0.5 0.7  AST 63* 116* 43* 29  ALT 26 55* 41 31  ALKPHOS 90 117 128* 135*  PROT 7.8 7.2 7.4 7.4  ALBUMIN 2.0* 1.8* 1.7* 1.7*    Assessment and Plan:  Recurrent Subhepatic fluid collection: Susan Warren is a 48 y.o. female with a history of complex right abdominal cyst s/p 5L aspiration of cyst on 8/22 with Dr. ONEIDA Specking.   -Significantly decreased drainage output today; only ~200cc out today  -Per medicine team, patient w/ likely discharge home/SNF tomorrow with IV antibiotics -Saline flushes sent to [facility] pharmacy  -Discussed at home drain care with patient who feels comfortable caring for this on her own -Discussed case with Dr. KANDICE Banner and will plan for 2 week telephone/virtual follow up to discuss drain output and plan moving forward   While Inpatient: Continue TID flushes with 5 cc NS. Record output Q shift. Dressing changes QD or PRN if soiled.   Discharge planning: -IR follow up order placed; 2 week virtual follow up initially -IR scheduler will contact patient with date/time of appointment -Patient will need to flush drain daily with 5-10mL saline, record daily output, and change dressing every 2-3 days or earlier if soiled   IR will continue to follow - please call with questions or concerns.  Thank you for allowing our service to participate in Susan Warren 's care.   Electronically Signed: Rhyatt Muska M Eshawn Coor, PA-C 08/17/2024, 10:53 AM    I spent a total of 25 Minutes at the the patient's bedside AND on the patient's hospital floor or unit, greater than 50% of which was counseling/coordinating care for drain care follow up.

## 2024-08-17 NOTE — Progress Notes (Signed)
 PHARMACY - ANTICOAGULATION CONSULT NOTE  Pharmacy Consult for warfarin dosing. Indication: VTE prophylaxis  Allergies  Allergen Reactions   Adhesive [Tape] Anaphylaxis and Swelling    *Adhesive Spray*     Patient Measurements: Height: 5' 4 (162.6 cm) Weight: (!) 263.5 kg (581 lb) IBW/kg (Calculated) : 54.7  Vital Signs: Temp: 98.1 F (36.7 C) (08/26 0740) Temp Source: Oral (08/26 0740) BP: 151/60 (08/26 0740) Pulse Rate: 96 (08/26 0740)  Labs: Recent Labs    08/15/24 0334 08/16/24 0353 08/17/24 0411  HGB 8.8* 8.7* 8.8*  HCT 30.3* 29.2* 29.9*  PLT 470* 512* 539*  LABPROT 32.8* 30.9* 33.8*  INR 3.0* 2.8* 3.2*  CREATININE 0.87 0.80 0.62    Estimated Creatinine Clearance: 183.5 mL/min (by C-G formula based on SCr of 0.62 mg/dL).   Medical History: Past Medical History:  Diagnosis Date   Abdominal pain due to right abdominal and pelvic cystic mass 04/16/2022   Allergy    Anxiety    Candidemia (HCC) 04/18/2022   Depression    DVT (deep venous thrombosis) (HCC)    GERD (gastroesophageal reflux disease)    Hepatic cyst    Hypertension    Lymphedema of both lower extremities    Migraines    Morbid obesity with BMI of 70 and over, adult [facility])    PE (pulmonary thromboembolism) (HCC) 03/2019   Pneumonia 04/09/2022   Sleep apnea     Medications:  Scheduled:   diclofenac  Sodium  4 g Topical QID   insulin  aspart  0-20 Units Subcutaneous TID WC   insulin  aspart  0-5 Units Subcutaneous QHS   lidocaine  1 %  10 mL Other Once   sodium chloride  flush  5 mL Intracatheter Q8H   vitamin B-12  100 mcg Oral Daily   Warfarin - Pharmacist Dosing Inpatient   Does not apply q1600    Assessment: INR 3.2 at 0411 hours today. Today patient reports still having occasional blood with toilet use per documentation by her physician, Dr. Waymond. Vaginal bleeding could be cause of anemia. Hgb 8.7, appears stable. Baseline around 10-11. Denies any acute signs of bleeding while on Warfarin,  all per review of Dr. Kyra note dated 26-AUG-25.  Goal of Therapy:  INR 2-3 Monitor platelets by anticoagulation protocol: Yes. Stable.   Plan:  As per her PTA dosing regimen, will give 2.5 mg warfarin by mouth today at ~1600 and evaluate dose response based on INR scheduled for 27-AUG-25. If marked response, will give consideration to decreasing dose from her usual 5 mg MWF; 2.5 mg all other days.   Lynwood KATHEE Lites, PharmD, CPP Clinical Pharmacist Practitioner 08/17/2024,8:14 AM

## 2024-08-17 NOTE — Progress Notes (Signed)
 Occupational Therapy Treatment and Discharge Patient Details Name: Susan Warren MRN: [id number] DOB: May 04, 1976 Today's Date: 08/17/2024   History of present illness 48 yo female admitted with R abdominal pain with 3 days of pain and nausea. Admitted for hepatic cyst leading to sub hepatic fluid collection. Drain placed 08/16/24. PMH HTN, hx of PE/ DVT on warfarin, obese, hx of R complex abdominal cyst, candidemia, lymphedema BLE ( untreated)   OT comments  Pt seen today and noted to be Mod I when up and about in her room--pushing IV pole and drain bag in IV pole as well. Pt did need A to get her legs into the air bed here but reports she has a method at home to get her legs into her normal bed on her own. She has been getting up to the bathroom by herself and reports she will not have any issues with LBD. We did discuss the care of a smaller drain she will have when she does go home. No further OT needs, we will sign off.      If plan is discharge home, recommend the following:   (intermittent A prn for mobility and ADLs)   Equipment Recommendations  None recommended by OT       Precautions / Restrictions Precautions Precautions: None Precaution/Restrictions Comments: foley drain bag in place for abdominal drain at present due to high output Restrictions Weight Bearing Restrictions Per Provider Order: No       Mobility Bed Mobility Overal bed mobility: Needs Assistance Bed Mobility: Supine to Sit       Sit to supine: Min assist   General bed mobility comments: Pt sitting EOB upon arrival, needed A to get both legs in bed separately--reports she has a system at home that works for her where she does not need assistance    Transfers Overall transfer level: Modified independent                 General transfer comment: sit<>stand at EOB; pt ambulated pushing IV pole (Mod I) to door and then back from door to bed without IV pole (Mod I due to increased time)      Balance Overall balance assessment: Needs assistance Sitting-balance support: No upper extremity supported, Feet supported Sitting balance-Leahy Scale: Normal     Standing balance support: No upper extremity supported Standing balance-Leahy Scale: Fair Standing balance comment: no overt LOB but has to take her time with first step if not using assistive device to ambulate.                           ADL either performed or assessed with clinical judgement   ADL Overall ADL's : At baseline                                       General ADL Comments: Pt stands to put her underwear and pants on--we did discuss how sitting is safer, but she feels the way she does it works for her. We spoke about a shower seat, but she does not want one since it may not fit in her tub and it would be in the way. She feels she is completely able at [facility] point to resume as she was taking care of herself before all of this. We did discuss her asking MD about if she can shower with drain  in and how to manage the drain once they change it back to the smaller pouch or bulb drain--by safety pinning it to her clothes.    Extremity/Trunk Assessment Upper Extremity Assessment Upper Extremity Assessment: Overall WFL for tasks assessed            Vision Baseline Vision/History: 1 Wears glasses Patient Visual Report: No change from baseline           Communication Communication Communication: No apparent difficulties   Cognition Arousal: Alert Behavior During Therapy: WFL for tasks assessed/performed Cognition: No apparent impairments                               Following commands: Intact                      Pertinent Vitals/ Pain       Pain Assessment Pain Assessment: Faces Faces Pain Scale: Hurts little more Pain Location: sore in abdomen Pain Descriptors / Indicators: Sore Pain Intervention(s): Limited activity within patient's tolerance, Monitored  during session, Repositioned         Frequency  Min 2X/week        Progress Toward Goals  OT Goals(current goals can now be found in the care plan section)  Progress towards OT goals: Goals met/education completed, patient discharged from OT  Acute Rehab OT Goals Patient Stated Goal: to hopefully go home soon wihtout IV antibiotics OT Goal Formulation: With patient         AM-PAC OT 6 Clicks Daily Activity     Outcome Measure   Help from another person eating meals?: None Help from another person taking care of personal grooming?: None Help from another person toileting, which includes using toliet, bedpan, or urinal?: None Help from another person bathing (including washing, rinsing, drying)?: None Help from another person to put on and taking off regular upper body clothing?: None Help from another person to put on and taking off regular lower body clothing?: None 6 Click Score: 24    End of Session    OT Visit Diagnosis: Unsteadiness on feet (R26.81)   Activity Tolerance Patient tolerated treatment well   Patient Left in bed;with call bell/phone within reach   Nurse Communication Mobility status        Time: 1010-1025 OT Time Calculation (min): 15 min  Charges: OT General Charges $OT Visit: 1 Visit OT Treatments $Self Care/Home Management : 8-22 mins  Donny BECKER OT Acute Rehabilitation Services Office [phone]    [place] Dorothyann Distel 08/17/2024, 11:02 AM

## 2024-08-17 NOTE — Plan of Care (Signed)
 Diagnosis: Hepatic abscess   Reviewed options for meds For outpatient convenience and spectrum, will change ertapenem to ceftriaxone  iv and po flagyl     OPAT Orders Discharge antibiotics to be given via PICC line Discharge antibiotics: Ceftriaxone  2 gram iv daily Oral flagyl  500 mg bid  Duration 4 weeks from 8/25 till 9/22  [facility] Care Per Protocol:  Home health RN for IV administration and teaching; PICC line care and labs.    Labs weekly while on IV antibiotics: _x_ CBC with differential __ BMP _x_ CMP _x_ CRP _x_ ESR __ Vancomycin trough __ CK  _x_ Please pull PIC at completion of IV antibiotics __ Please leave PIC in place until doctor has seen patient or been notified  Fax weekly labs to [phone]  Clinic Follow Up Appt: 9/15 @ 945  @  [facility] [address], [place], [place] [address] Phone: [phone]

## 2024-08-17 NOTE — Progress Notes (Signed)
 PHARMACY CONSULT NOTE FOR:  OUTPATIENT  PARENTERAL ANTIBIOTIC THERAPY (OPAT)  Indication: Klebsiella Pneumoniae Intra-abdominal infection Regimen: Ceftriaxone  2g IV daily + oral Metronidazole  500mg  every 12 hours End date: 09/13/24 (4 weeks from 8/25)  IV antibiotic discharge orders are pended. To discharging provider:  please sign these orders via discharge navigator,  Select New Orders & click on the button choice - Manage This Unsigned Work.     Thank you for allowing pharmacy to be a part of this patient's care.  Frederica Chrestman 08/17/2024, 8:55 AM

## 2024-08-17 NOTE — Progress Notes (Signed)
 PHARMACY - ANTICOAGULATION CONSULT NOTE  Pharmacy Consult for converting from oral anticoagulation with warfarin to a direct oral anticoagulant  (DOAC) Indication: VTE prophylaxis  Allergies  Allergen Reactions   Adhesive [Tape] Anaphylaxis and Swelling    *Adhesive Spray*     Patient Measurements: Height: 5' 4 (162.6 cm) Weight: (!) 263.5 kg (581 lb) IBW/kg (Calculated) : 54.7  Vital Signs: Temp: 97.7 F (36.5 C) (08/26 1321) Temp Source: Oral (08/26 1321) BP: 149/71 (08/26 1321) Pulse Rate: 97 (08/26 1321)  Labs: Recent Labs    08/15/24 0334 08/16/24 0353 08/17/24 0411  HGB 8.8* 8.7* 8.8*  HCT 30.3* 29.2* 29.9*  PLT 470* 512* 539*  LABPROT 32.8* 30.9* 33.8*  INR 3.0* 2.8* 3.2*  CREATININE 0.87 0.80 0.62    Estimated Creatinine Clearance: 183.5 mL/min (by C-G formula based on SCr of 0.62 mg/dL).   Medical History: Past Medical History:  Diagnosis Date   Abdominal pain due to right abdominal and pelvic cystic mass 04/16/2022   Allergy    Anxiety    Candidemia (HCC) 04/18/2022   Depression    DVT (deep venous thrombosis) (HCC)    GERD (gastroesophageal reflux disease)    Hepatic cyst    Hypertension    Lymphedema of both lower extremities    Migraines    Morbid obesity with BMI of 70 and over, adult [facility])    PE (pulmonary thromboembolism) (HCC) 03/2019   Pneumonia 04/09/2022   Sleep apnea     Medications:  Scheduled:   diclofenac  Sodium  4 g Topical QID   insulin  aspart  0-20 Units Subcutaneous TID WC   insulin  aspart  0-5 Units Subcutaneous QHS   lidocaine  1 %  10 mL Other Once   sodium chloride  flush  5 mL Intracatheter Q8H   vitamin B-12  100 mcg Oral Daily   warfarin  2.5 mg Oral ONCE-1600   Warfarin - Pharmacist Dosing Inpatient   Does not apply q1600    Assessment: Although data in these patient groups are limited, apixaban and rivaroxaban  show a favorable pharmacokinetic and pharmacodynamic profile in obese VTE treatment and NVAF patients  and, in the case of apixaban, also in underweight patients. In particular, these drugs demonstrated comparable efficacy and safety to standard therapy. Very few data were available for dabigatran and edoxaban; the latter drug was safer at a lower dose, mainly in underweight patients.  These findings are in line with the last International Society of Haemostasis and Thrombosis position paper and European Heart Rhythm Association 2021 practical guide, suggesting the use of apixaban and rivaroxaban  in morbidly obese patients (>120?kg or body mass index >=40?kg/m 2 ).  Shared decision making discussion involving the primary team, patient, myself and ID Pharmacist Almarie Lunger, PharmD, discussed in addition to the above Guideline Recommended findings published as described. Additionally, the discussion of once-daily vs twice daily use of the drug(s) was discussed. Finally, the following information was discussed as a part of the shared decision making:  Rivaroxaban  has the most extensive body of data in patients with obesity, followed closely by apixaban. In contrast, data for dabigatran and edoxaban in this patient population are more limited, particularly for individuals with severe obesity.   Apixaban and rivaroxaban   Multiple systematic reviews and guidelines support the use of apixaban (Eliquis) and rivaroxaban  (Xarelto ) in obese patients, including those with severe or morbid obesity (BMI >=40 kg/m or weight >120 kg).  Clinical trial data: Post-hoc analyses of major clinical trials like ROCKET AF (rivaroxaban ) and  ARISTOTLE (apixaban) found that these DOACs were effective and safe across a range of BMI subgroups, including obese patients. Real-world evidence: Large observational and retrospective studies have shown that both rivaroxaban  and apixaban are effective and safe compared to warfarin in morbidly obese patients with atrial fibrillation and venous thromboembolism (VTE). Pharmacokinetic data:  The extensive data available for rivaroxaban  suggest that its pharmacological profile is not significantly affected by high body weight, supporting standard dosing. For apixaban, studies have shown that while higher weight can affect drug concentrations, levels generally remain within the expected therapeutic range for most patients.   FROM:  TH Open. 2024 Jan 8;8(1):e31-e41. doi: 10.1055/[id number] Direct-Acting Oral Anticoagulants in patients at extremes of body weight: a review of pharmacological considerations and clinical implications Rosa Talerico 1,2,?, [address], [place] Albertus Klok 3, [place] Volk[address] 3  Goal of Therapy:  Secondary prophylaxis against recurrence of her index event dating back to 2020 she states. Patient after shared decision making discussion and discussion of GRT as well as above discussion points indicates a preference of once daily dosing with rivaroxaban  at the 20 mg dose, once-daily with evening meal.   Plan:  Patient education materials have been provided to the patient at the bedside. Patient was provided an opportunity to ask and have answered to her satisfaction any questions.   Lynwood KATHEE Lites, PharmD, CPP Clinical Pharmacist Practitioner 08/17/2024,1:26 PM

## 2024-08-17 NOTE — Discharge Instructions (Addendum)
 To Susan Warren or their caretakers,  You were recently admitted to [facility] for an abscess below your liver. The fluid collection is likely due to an infection and you will need intravenous antibiotic therapy for 4 weeks. You will need to follow up with Interventional Radiology for your drain care. We discussed with the Cardiology team (heart doctors) regarding your flutter symptoms this morning. They are not concerned about the episode you had this morning and think it may be related to the low magnesium  levels you had.  Continue taking your home medications with the following changes:  Start taking Ceftriaxone  (Rocephin ) 2g IV once daily Metronidazole  (Flagyl ) 500mg  twice daily Cyanocobalamin  (Vitamin B12) 1000mcg daily Xarelto  20mg  once daily Stop taking Warfarin 5mg  tablet MWF and 2.5mg  other days Maalox/Mylanta (alum & mag) suspension Augmentin  (amoxicillin /clavulunate) Continue taking Acetaminophen  (Tylenol ) 650mg  every 6 hours as needed Oxycodone  IR 5mg  every 4 hours as needed Cyanocobalamin  (Vitamin B12) 1000mcg daily Ondansetron  (Zofran ) 4mg  as needed for nausea Senna-docusate (senokot) 8.6-50mg  at bedtime as needed Methocarbamol  (Robaxin ) 750mg  every 6 hours as needed   You should seek further medical care if you notice increased bleeding (coughing up blood, increased vaginal bleeding, rectal bleeding, etc.) or chest pain, shortness of breath, leg swelling. If your abdominal pain worsens, please seek medical care. If you start having a fever and redness near your catheter site, please seek medical care.  Please follow up with the following doctors/specialties: Infectious Diseases - you have an appointment on 09/06/24 with Dr. Montie Bologna. Obstetrics/Gynecology - you have an appointment on 08/31/24 with Dr. Vina Solian Interventional Radiology - please schedule an appointment with [facility] for removal of your drain.  We recommend that you also see  your primary care doctor in about a week to make sure that you continue to improve. We are so glad that you are feeling better.  Sincerely,  [facility]    Interventional Radiology Percutaneous Drain Placement After Care   This sheet gives you information about how to care for yourself after your procedure. Your health care provider may also give you more specific instructions. Your drain was placed by an interventional radiologist with [facility]. If you have questions or concerns, contact [facility] at [phone].   What is a percutaneous drain?   A drain is a small plastic tube (catheter) that goes into the fluid collection in your body through your skin.   How long will I need the drain?   How long the drain needs to stay in is determined by where the drain is, how much comes out of the drain each day and if you are having any other surgical procedures.   Interventional radiology will determine when it is time to remove the drain. It is important to follow up as directed so that the drain can be removed as soon as it is safe to do so.   What can I expect after the procedure?   After the procedure, it is common to have:   A small amount of bruising and discomfort in the area where the drainage tube (catheter) was placed.   Sleepiness and fatigue. This should go away after the medicines you were given have worn off.   Follow these instructions at home:   Insertion site care   Check your insertion site when you change the bandage. Check for:   More redness, swelling, or pain.   More fluid or blood.   Warmth.  Pus or a bad smell.   When caring for your insertion site:   Wash your hands with soap and water for at least 20 seconds before and after you change your bandage (dressing). If soap and water are not available, use hand sanitizer.   You do not need to change your dressing everyday if it is clean and dry. Change your  dressing every 3 days or as needed when it is soiled, wet or becoming dislodged. You will need to change your dressing each time you shower.   Leave stitches (sutures), skin glue, or adhesive strips in place. These skin closures may need to stay in place for 2 weeks or longer. If adhesive strip edges start to loosen and curl up, you may trim the loose edges. Do not remove adhesive strips completely unless your health care provider tells you to do so.   Catheter care   Flush the catheter once per day with 5 mL of 0.9% normal saline unless you are told otherwise by your healthcare provider. This helps to prevent clogs in the catheter.   To disconnect the drain, turn the clear plastic tube to the left. Attach the saline syringe by placing it on the white end of the drain and turning gently to the right. Once attached gently push the plunger to the 5 mL mark. After you are done flushing, disconnect the syringe by turning to the left and reattach your drainage container   Check for fluid leaking from around your catheter (instead of fluid draining through your catheter). This may be a sign that the drain is no longer working correctly.   Write down the following information every time you empty your bag:   The date and time.   The amount of drainage.   Activity   Rest at home for 1-2 days after your procedure.   For the first 48 hours do not lift anything more than 10 lbs (about a gallon of milk). You may perform moderate activities/exercise. Please avoid strenuous activities during this time.   Avoid any activities which may pull on your drain as this can cause your drain to become dislodged.   If you were given a sedative during the procedure, it can affect you for several hours. Do not drive or operate machinery until your health care provider says that it is safe.   General instructions   For mild pain take over-the-counter medications as needed for pain such as Tylenol  or Advil . If you  are experiencing severe pain please call our office as this may indicate an issue with your drain.    If you were prescribed an antibiotic medicine, take it as told by your health care provider. Do not stop using the antibiotic even if you start to feel better.   You may shower 24 hours after the drain is placed. To do this cover the insertion site with a water tight material such as saran wrap and seal the edges with tape, you may also purchase waterproof dressings at your local drug store. Shower as usual and then remove the water tight dressing and any gauze/tape underneath it once you have exited the shower and dried off. Allow the area to air dry or pat dry with a clean towel. Once the skin is completely dry place a new gauze dressing. It is important to keep the site dry at all times to prevent infection.   Do not submerge the drain - this means you cannot take baths, swim, use a  hot tub, etc. until the drain is removed.    Do not use any products that contain nicotine or tobacco, such as cigarettes, e-cigarettes, and chewing tobacco. If you need help quitting, ask your health care provider.   Keep all follow-up visits as told by your health care provider. This is important.   Contact a health care provider if:   You have less than 10 mL of drainage a day for 2-3 days in a row, or as directed by your health care provider.   You have any of these signs of infection:   More redness, swelling, or pain around your incision area.   More fluid or blood coming from your incision area.   Warmth coming from your incision area.   Pus or a bad smell coming from your incision area.   You have fluid leaking from around your catheter (instead of through your catheter).   You are unable to flush the drain.   You have a fever or chills.   You have pain that does not get better with medicine.   You have not been contacted to schedule a drain follow up appointment within 10 days of discharge  from the hospital.   Please call [facility] at [phone] with any questions or concerns.   Get help right away if:   Your catheter comes out.   You suddenly stop having drainage from your catheter.   You suddenly have blood in the fluid that is draining from your catheter.   You become dizzy or you faint.   You develop a rash.   You have nausea or vomiting.   You have difficulty breathing or you feel short of breath.   You develop chest pain.   You have problems with your speech or vision.   You have trouble balancing or moving your arms or legs.   Summary   It is common to have a small amount of bruising and discomfort in the area where the drainage tube (catheter) was placed. You may also have minor discomfort with movement while the drain is in place.   Flush the drain once per day with 5 mL of 0.9% normal saline (unless you were told otherwise by your healthcare provider).    Record the amount of drainage from the bag every time you empty it.   Change the dressing every 3 days or earlier if soiled/wet. Keep the skin dry under the dressing.   You may shower with the drain in place. Do not submerge the drain (no baths, swimming, hot tubs, etc.).   Contact [facility] at [phone] if you have more redness, swelling, or pain around your incision area or if you have pain that does not get better with medicine.   This information is not intended to replace advice given to you by your health care provider. Make sure you discuss any questions you have with your health care provider.   Interventional Radiology Drain Record   Empty your drain at least once per day. You may empty it as often as needed. Use this form to write down the amount of fluid that has collected in the drainage container. Bring this form with you to your follow-up visits. Please call [facility] at [phone] with any questions or concerns prior to your appointment.    Drain #1 location: ___________________   Date __________ Time __________ Amount __________   Date __________ Time __________ Amount __________   Date __________ Time __________ Amount  __________   Date __________ Time __________ Amount __________   Date __________ Time __________ Amount __________   Date __________ Time __________ Amount __________   Date __________ Time __________ Amount __________   Date __________ Time __________ Amount __________   Date __________ Time __________ Amount __________   Date __________ Time __________ Amount __________   Date __________ Time __________ Amount __________   Date __________ Time __________ Amount __________   Date __________ Time __________ Amount __________   Date __________ Time __________ Amount __________

## 2024-08-17 NOTE — Progress Notes (Addendum)
 HD#4 SUBJECTIVE:  Patient Summary: Susan Warren is a 48 y.o. with a pertinent PMH of PE and DVT on warfarin, complex right abdominal cyst, candidemia (2023), HTN, morbid obesity, who presented with SOB and RUQ abdominal pain and admitted for hepatic cyst leading to subhepatic fluid collection.   Overnight Events: None  Interim History:  Patient's RUQ pain is controlled this morning, but says that it comes back intermittently and feels sharp. She has been eating but notes a mildly decreased appetite. She is concerned about outpatient IV antibiotic therapy because of her cats at home.    OBJECTIVE:  Vital Signs: Vitals:   08/16/24 1935 08/16/24 2351 08/17/24 0349 08/17/24 0740  BP: (!) 120/56 99/78 132/66 (!) 151/60  Pulse:  99 99 96  Resp: 20 20 20  (!) 24  Temp: 97.7 F (36.5 C) 98.4 F (36.9 C) 98.1 F (36.7 C) 98.1 F (36.7 C)  TempSrc: Oral Oral Oral Oral  SpO2: 97% 97% 96% 94%  Weight:      Height:       Supplemental O2: Room Air SpO2: 94 % O2 Flow Rate (L/min): 3 L/min  Filed Weights   08/12/24 0549  Weight: (!) 263.5 kg     Intake/Output Summary (Last 24 hours) at 08/17/2024 1133 Last data filed at 08/17/2024 0359 Gross per 24 hour  Intake 25 ml  Output 3250 ml  Net -3225 ml   Net IO Since Admission: 913.01 mL [08/17/24 1133]  Physical Exam: Constitutional: well-appearing, morbidly obese, sitting up in hospital bed in no acute distress HENT: normocephalic atraumatic, mucous membranes moist Eyes: conjunctiva non-erythematous, PERRL, no scleral icterus Cardiovascular: palpable pulses Pulmonary/Chest: normal work of breathing on room air Abdominal: soft, non-tender, non-distended, bowel sounds normal; no purpura or other signs of bruising noted at drain insertion site. No purulent drainage at the drain site. Foley bag hanging at edge of bed with dark brown output.  MSK: normal bulk and tone Neurological: alert & oriented x3 Skin: warm and dry; several  excoriations on abdomen. Extremities: no cyanosis present; significant BLE edema present; peripheral pulses intact Psych: normal mood and affect, thought content normal   Patient Lines/Drains/Airways Status     Active Line/Drains/Airways     Name Placement date Placement time Site Days   Peripheral IV 08/12/24 20 G 2.5 Anterior;Right Forearm 08/12/24  0647  Forearm  1   External Urinary Catheter 04/16/22  2300  --  850            Pertinent labs and imaging:      Latest Ref Rng & Units 08/17/2024    4:11 AM 08/16/2024    3:53 AM 08/15/2024    3:34 AM  CBC  WBC 4.0 - 10.5 K/uL 12.7  13.9  13.2   Hemoglobin 12.0 - 15.0 g/dL 8.8  8.7  8.8   Hematocrit 36.0 - 46.0 % 29.9  29.2  30.3   Platelets 150 - 400 K/uL 539  512  470        Latest Ref Rng & Units 08/17/2024    4:11 AM 08/16/2024    3:53 AM 08/15/2024    3:34 AM  CMP  Glucose 70 - 99 mg/dL 771  843  828   BUN 6 - 20 mg/dL 9  10  13    Creatinine 0.44 - 1.00 mg/dL 9.37  9.19  9.12   Sodium 135 - 145 mmol/L 131  131  133   Potassium 3.5 - 5.1 mmol/L 4.5  4.3  4.6   Chloride 98 - 111 mmol/L 96  98  97   CO2 22 - 32 mmol/L 25  25  27    Calcium 8.9 - 10.3 mg/dL 8.2  8.4  8.2   Total Protein 6.5 - 8.1 g/dL  7.4  7.4   Total Bilirubin 0.0 - 1.2 mg/dL  0.7  0.5   Alkaline Phos 38 - 126 U/L  135  128   AST 15 - 41 U/L  29  43   ALT 0 - 44 U/L  31  41     IR PATIENT EVAL TECH 0-60 MINS Result Date: 08/16/2024 Leigh Andrea LABOR, RT     08/16/2024  4:27 PM Tech called to bedside due to bag frequently being filled. Bag was in the process of being emptied when I arrived. Changed gravity drain bag to foley bag and added a Y adapter to flush. Drain was flushed twice and not much coming out now, Dr Vanice Aware.     CT GUIDED PERITONEAL/RETROPERITONEAL FLUID DRAIN BY PERC CATH Result Date: 08/16/2024 INDICATION: RECURRENT CHRONIC LARGE EXOPHYTIC INFECTED HEPATIC CYST EXAM: CT DRAINAGE OF THE RECURRENT INFERIOR INFECTED SUBHEPATIC CYST  MEDICATIONS: The patient is currently admitted to the hospital and receiving intravenous antibiotics. The antibiotics were administered within an appropriate time frame prior to the initiation of the procedure. ANESTHESIA/SEDATION: Moderate (conscious) sedation was employed during this procedure. A total of Versed  1.0 mg and Fentanyl  25 mcg was administered intravenously by the radiology nurse. Total intra-service moderate Sedation Time: 11 minutes. The patient's level of consciousness and vital signs were monitored continuously by radiology nursing throughout the procedure under my direct supervision. COMPLICATIONS: None immediate. PROCEDURE: Informed written consent was obtained from the patient after a thorough discussion of the procedural risks, benefits and alternatives. All questions were addressed. Maximal Sterile Barrier Technique was utilized including caps, mask, sterile gowns, sterile gloves, sterile drape, hand hygiene and skin antiseptic. A timeout was performed prior to the initiation of the procedure. Previous imaging reviewed. Patient positioned supine. Noncontrast localization CT performed. The chronic subhepatic exophytic infected cyst was localized and marked for drainage for an anterior approach. Under sterile conditions and local anesthesia, the 18 gauge 10 cm access needle was advanced from a right anterior abdominal approach. Needle position confirmed with CT. Guidewire inserted followed by tract dilatation to insert a 12 [place] drain. Drain catheter position confirmed with CT. Syringe aspiration yielded dark brown blood tinged fluid. Catheter secured with silk suture and a sterile dressing. Gravity drainage bag connected because of the large size. Once the output decreases, gravity bag can be switched to suction bulb. No immediate complication. Patient tolerated the procedure well. IMPRESSION: Successful CT-guided drainage of the infected large subhepatic chronic cyst. Electronically Signed    By: CHRISTELLA.  Shick M.D.   On: 08/16/2024 12:30     ASSESSMENT/PLAN:  Assessment: Principal Problem:   RUQ abdominal pain Active Problems:   Hypertension   History of pulmonary embolism   Hepatic cyst   Lymphedema of both lower extremities   Morbid obesity with body mass index of 70 and over in adult [facility])   Intraabdominal fluid collection   Hepatic abscess  Patient Summary: Susan Warren is a 48 y.o. with a pertinent PMH of PE and DVT on warfarin, complex right abdominal cyst, candidemia (2023), HTN, morbid obesity, who presented with SOB and RUQ abdominal pain and admitted for subhepatic abscess.   Plan:  #Recurrent subhepatic abscess #Recurrent hepatic cysts #Hx of right  abdominal abscess (2023) Abdominal pain initially resolved after first drainage, but returned as more fluid accumulated. No pain when we evaluated patient at bedside today but patient is worried it will return as she notes intermittent stabbing pain. IR placed drain on 8/25 and initially 3.25L drained. The drain was flushed and replaced with Foley bag and appears to have slowed down. First aspiration was 5L from 2.6 x 2.3 x 2.3 cm fluid collection growing Klebsiella only resistant to ampicillin , but susceptible to ampicillin gabino. Consulted ID for assistance regarding antimicrobial coverage. Settled on Unasyn  while the patient is in the hospital. Disposition is unclear as patient feels her home environment is unsafe for IV care but her deep abscess and body habitus dictates prolonged IV therapy. -ID recs:  -restart Unasyn  while inpatient  -plan for discharge is PO flagyl  + IV ceftriaxone  via PICC for 4 weeks (8/25-9/22)  -discontinue zosyn  (i8/22 - 8/25)  -stop antifungals; avoid due to supratherapeutic INR -IR recs:  -s/p drainage of 5L on 08/12/24  -drain placed 08/16/24, awaiting further recommendations regarding safe output for discharge -Pain Regimen:  -Tylenol  1000 mg Q6H PRN -Oxycodone  5 mg Q4H PRN  moderate/severe   #Dyspnea, resolved #Sinus tachycardia #Hx of DVT and PE on Warfarin She initially presented with dyspnea but this has since resolved. Has been taking warfarin as prescribed with therapeutic INR. D-dimer 3.88. Unable to obtain CT or V/Q imaging due to body habitus. TTE shows normal EF and no right heart strain. Wells score of at least 3 but she is adherent to warfarin with INR of 2.8 in goal range. Low suspicion for PE given adequate warfarin use. -Echo: Normal EF, no right heart strain -Continue telemetry -Continue warfarin with pharmacy consult (INR goal 2-3)  #Abnormal uterine bleeding #Uterine fibroid #Thickened endometrium Today patient reports still having occasional blood with toilet use. ParaGard placed in 2003 with unsuccessful removal in 2014. CT venogram of the pelvis shows IUD still in place in 2020 and CT of the abdomen pelvis in 2023 does not but images are suboptimal. OB/GYN consulted for vaginal bleeding. Transvaginal ultrasound here with intramural uterine fibroid and thickened endometrium. Still denies any symptoms of PID. -Continue with planned outpatient follow-up with OB/GYN for endometrial biopsy -If the patient is to go to to the OR under general anesthesia OB/GYN requests to be notified as they may be able to evaluate and retrieve the IUD  #Chronic Anemia, normocytic  #Hx of B12 deficiency  Vaginal bleeding could be cause of anemia. Hgb 8.7, appears stable. Baseline around 10-11. Denies any acute signs of bleeding while on Warfarin. Last B12 low 180. B12 during this admission of 185. Not on supplementation per patient.   -Trend CBC and signs of bleed -Oral vitamin B12 supplementation after drain placed  #Mild hyponatremia Na consistently down-trending from 137 on admission to 131 today. Patient is currently asymptomatic. Could be in the setting of chronic peripheral edema or malnutrition given low albumin. Will continue to trend. -continue to  monitor  #Hx of candidemia  04/16/22 Bcx grew Candida parapsilosis and treated with micafungin  then fluconazole  for 14 days. TEE w/o vegetations. Repeat Bcx negative. WBC 14.2. Recent treatment of sinus infection per patient. Denies any new cough or dysuria. Denies any acute skin infections. Fungal culture from aspiration is negative. ID consulted and recommended holding off on antifungals d/t findings of gram negative rods on stain of abdominal fluid collection.   #AKI, resolved Scr 1.10 on admission, now back to baseline of 0.6-0.8. Likely pre-renal.  She received a couple LR boluses. -Trend BMP   #HTN Notes hx of this but is normotensive. No longer on antihypertensives due to insurance change for several months.   #Hepatic steatosis #Morbid obesity LFTs elevated after first drainage, likely due to inflammatory changes s/p IR drainage of hepatic cyst. Today back to normal. She has current and prior imaging showing hepatic steatosis which is likely related to [facility] given significantly elevated BMI. -trend LFTs  Best Practice: Diet: Cardiac diet IVF: Fluids: none VTE: Warfarin Code: Full  Disposition planning: Therapy Recs: Pending, DME: pending DISPO: Anticipated discharge in 1-2 days to Home pending clinical improvement.  Signature:  Letha Cheadle, MD [facility]  11:33 AM, 08/17/2024  On Call pager [phone]

## 2024-08-17 NOTE — TOC Progression Note (Signed)
 Transition of Care (TOC) - Progression Note  Rayfield Gobble RN, BSN Inpatient Care Management Unit 4E- RN Case Manager See Treatment Team for direct phone #   Patient Details  Name: Susan Warren MRN: [id number] Date of Birth: 1976-03-04  Transition of Care [facility]) CM/SW Contact  [name] Hurst, RN Phone Number: 08/17/2024, 3:23 PM  Clinical Narrative:    Notified that pt will need LT IV abx.   CM in to speak with pt at bedside regarding transition plan.  Per conversation with pt she confirmed that she is willing to go home w/ HH and IV abx- she voiced that her adult son could assist if needed who lives with her. She also has a MIL next door, and her mom lives about 10 min away.  Pt feels she will be able to manage home infusion.  Pt does state that she has 3 cats in the home- and will most likely go to her MIL home next door when Home nursing comes for their weekly visits for PICC line care and lab draws.  MIL address is [id number].   Discussed HH needs for nursing- pt voiced she does not have a preference and is agreeable to use [facility] for home infusion and coordination of nursing needs. Pt declines any other HH needs and will wait until she finishes IV abx to follow up on outpt lymphedema referral.   IV abx end date 9/22 per OPAT.   CM has spoken with liaison Pam w/ home infusion agency- [facility]- OPAT has bene placed by ID- Pam will follow up with pt to schedule bedside education. Anticipate education to be this afternoon/evening.  Per MD pt may be ready for discharge tomorrow.   IP CM will follow for further coordination with home infusion prior to discharge.   Pt stated that her mom would most likely transport her home.    Expected Discharge Plan: OP Rehab Barriers to Discharge: Continued Medical Work up               Expected Discharge Plan and Services   Discharge Planning Services: CM Consult Post Acute Care Choice: Home Health Living arrangements  for the past 2 months: Single Family Home                 DME Arranged: N/A DME Agency: NA       HH Arranged: RN, IV Antibiotics HH Agency: [facility] Date HH Agency Contacted: 08/17/24 Time HH Agency Contacted: 1445 Representative spoke with at [facility] Agency: Pam   Social Drivers of Health (SDOH) Interventions SDOH Screenings   Food Insecurity: Food Insecurity Present (12/11/2023)  Housing: Low Risk  (12/11/2023)  Transportation Needs: No Transportation Needs (12/11/2023)  Alcohol Screen: Low Risk  (12/11/2023)  Depression (PHQ2-9): Low Risk  (06/17/2022)  Recent Concern: Depression (PHQ2-9) - Medium Risk (05/06/2022)  Financial Resource Strain: Low Risk  (12/11/2023)  Physical Activity: Insufficiently Active (12/11/2023)  Social Connections: Unknown (12/11/2023)  Stress: No Stress Concern Present (12/11/2023)  Tobacco Use: Low Risk  (12/11/2023)    Readmission Risk Interventions     No data to display

## 2024-08-18 LAB — COMPREHENSIVE METABOLIC PANEL WITH GFR
ALT: 19 U/L (ref 0–44)
AST: 18 U/L (ref 15–41)
Albumin: 1.6 g/dL — ABNORMAL LOW (ref 3.5–5.0)
Alkaline Phosphatase: 117 U/L (ref 38–126)
Anion gap: 10 (ref 5–15)
BUN: 8 mg/dL (ref 6–20)
CO2: 26 mmol/L (ref 22–32)
Calcium: 8.1 mg/dL — ABNORMAL LOW (ref 8.9–10.3)
Chloride: 96 mmol/L — ABNORMAL LOW (ref 98–111)
Creatinine, Ser: 0.64 mg/dL (ref 0.44–1.00)
GFR, Estimated: >60 mL/min (ref >60)
Glucose, Bld: 183 mg/dL — ABNORMAL HIGH (ref 70–99)
Potassium: 4.6 mmol/L (ref 3.5–5.1)
Sodium: 132 mmol/L — ABNORMAL LOW (ref 135–145)
Total Bilirubin: 0.4 mg/dL (ref 0.0–1.2)
Total Protein: 6.8 g/dL (ref 6.5–8.1)

## 2024-08-18 LAB — CBC WITH DIFFERENTIAL/PLATELET
Abs Immature Granulocytes: 0.49 K/uL — ABNORMAL HIGH (ref 0.00–0.07)
Basophils Absolute: 0 K/uL (ref 0.0–0.1)
Basophils Relative: 0 %
Eosinophils Absolute: 0.2 K/uL (ref 0.0–0.5)
Eosinophils Relative: 1 %
HCT: 28.2 % — ABNORMAL LOW (ref 36.0–46.0)
Hemoglobin: 8.3 g/dL — ABNORMAL LOW (ref 12.0–15.0)
Immature Granulocytes: 4 %
Lymphocytes Relative: 10 %
Lymphs Abs: 1.3 K/uL (ref 0.7–4.0)
MCH: 24.3 pg — ABNORMAL LOW (ref 26.0–34.0)
MCHC: 29.4 g/dL — ABNORMAL LOW (ref 30.0–36.0)
MCV: 82.7 fL (ref 80.0–100.0)
Monocytes Absolute: 0.8 K/uL (ref 0.1–1.0)
Monocytes Relative: 7 %
Neutro Abs: 10 K/uL — ABNORMAL HIGH (ref 1.7–7.7)
Neutrophils Relative %: 78 %
Platelets: 507 K/uL — ABNORMAL HIGH (ref 150–400)
RBC: 3.41 MIL/uL — ABNORMAL LOW (ref 3.87–5.11)
RDW: 16.8 % — ABNORMAL HIGH (ref 11.5–15.5)
WBC: 12.9 K/uL — ABNORMAL HIGH (ref 4.0–10.5)
nRBC: 0.2 % (ref 0.0–0.2)

## 2024-08-18 LAB — GLUCOSE, CAPILLARY
Glucose-Capillary: 144 mg/dL — ABNORMAL HIGH (ref 70–99)
Glucose-Capillary: 211 mg/dL — ABNORMAL HIGH (ref 70–99)
Glucose-Capillary: 189 mg/dL — ABNORMAL HIGH (ref 70–99)
Glucose-Capillary: 228 mg/dL — ABNORMAL HIGH (ref 70–99)

## 2024-08-18 MED ORDER — SODIUM CHLORIDE 0.9% FLUSH: 10.0000 mL | INTRAVENOUS | Status: DC | PRN

## 2024-08-18 MED ORDER — SODIUM CHLORIDE 0.9% FLUSH
10.0000 mL | Freq: Two times a day (BID) | INTRAVENOUS | Status: DC
  Administered 2024-08-18 – 2024-08-19 (×3): 10 mL

## 2024-08-18 MED ORDER — CHLORHEXIDINE GLUCONATE CLOTH 2 % EX PADS
6.0000 | MEDICATED_PAD | Freq: Every day | CUTANEOUS | Status: DC
  Administered 2024-08-18 – 2024-08-19 (×2): 6 via TOPICAL

## 2024-08-18 NOTE — Progress Notes (Signed)
 Another episode of large clots when patient went to the bathroom.

## 2024-08-18 NOTE — Plan of Care (Signed)
 ?  Problem: Clinical Measurements: ?Goal: Will remain free from infection ?Outcome: Progressing ?  ?

## 2024-08-18 NOTE — Progress Notes (Signed)
 Pt called out to let this RN know that she has passed larger clots when she went to the bathroom. This is a change from prior instances.  Paged on call MD. Patient came to bedside to assess. Will pass along to days

## 2024-08-18 NOTE — Progress Notes (Signed)
 PT Cancellation Note  Patient Details Name: Susan Warren MRN: [id number] DOB: Jul 21, 1976   Cancelled Treatment:    Reason Eval/Treat Not Completed: Patient declined, no reason specified.   Dorothyann Maier, DPT, CLT  Acute Rehabilitation Services Office: [phone] (Secure chat preferred)    Dorothyann VEAR Maier 08/18/2024, 2:34 PM

## 2024-08-18 NOTE — Progress Notes (Signed)
 HD#5 SUBJECTIVE:  Patient Summary: Susan Warren is a 48 y.o. with a pertinent PMH of PE and DVT on warfarin, complex right abdominal cyst, candidemia (2023), HTN, morbid obesity, who presented with SOB and RUQ abdominal pain and admitted for hepatic cyst leading to subhepatic fluid collection.   Overnight Events: Passed a large vaginal blood clot overnight.  At that time she was feeling fine and vitals were stable. She did not have any abdominal pain at that time.  Interim History:  Patient is feeling sharp abdominal pain that is worse compared to yesterday. She has been passing vaginal clots. She has been eating and drinking okay.  She is very apprehensive about caring for her PICC line and is not feeling comfortable going home today.  OBJECTIVE:  Vital Signs: Vitals:   08/17/24 1544 08/17/24 1952 08/17/24 2323 08/18/24 0403  BP: (!) 146/84 (!) 176/68 (!) 143/76 128/69  Pulse: 96 (!) 104 99 92  Resp: 20 20 20 20   Temp: 97.7 F (36.5 C) 99.6 F (37.6 C) 98.5 F (36.9 C) 99.2 F (37.3 C)  TempSrc: Oral Oral Oral Oral  SpO2: 98% 93% 100% 94%  Weight:      Height:       Supplemental O2: Room Air SpO2: 94 % O2 Flow Rate (L/min): 3 L/min  Filed Weights   08/12/24 0549  Weight: (!) 263.5 kg     Intake/Output Summary (Last 24 hours) at 08/18/2024 1052 Last data filed at 08/18/2024 0655 Gross per 24 hour  Intake 604.86 ml  Output 375 ml  Net 229.86 ml   Net IO Since Admission: 1,142.87 mL [08/18/24 1052]  Physical Exam: Constitutional: well-appearing, morbidly obese, sitting up in hospital bed in no acute distress HENT: normocephalic atraumatic, mucous membranes moist Eyes: conjunctiva non-erythematous, PERRL, no scleral icterus Cardiovascular: palpable pulses, regular rhythm rate/rhythm, no m/r/g Pulmonary/Chest: normal work of breathing on room air Abdominal: soft, non-tender, non-distended, bowel sounds normal; no purpura or other signs of bruising noted at drain  insertion site. No purulent drainage at the drain site. Foley bag hanging at edge of bed with dark brown output.  MSK: normal bulk and tone Neurological: alert & oriented x3 Skin: warm and dry; several excoriations on abdomen. Extremities: no cyanosis present; significant BLE edema present; peripheral pulses intact Psych: normal mood and affect, thought content normal   Patient Lines/Drains/Airways Status     Active Line/Drains/Airways     Name Placement date Placement time Site Days   Peripheral IV 08/12/24 20 G 2.5 Anterior;Right Forearm 08/12/24  0647  Forearm  1   External Urinary Catheter 04/16/22  2300  --  850            Pertinent labs and imaging:      Latest Ref Rng & Units 08/18/2024    3:02 AM 08/17/2024    4:11 AM 08/16/2024    3:53 AM  CBC  WBC 4.0 - 10.5 K/uL 12.9  12.7  13.9   Hemoglobin 12.0 - 15.0 g/dL 8.3  8.8  8.7   Hematocrit 36.0 - 46.0 % 28.2  29.9  29.2   Platelets 150 - 400 K/uL 507  539  512        Latest Ref Rng & Units 08/18/2024    3:02 AM 08/17/2024    4:11 AM 08/16/2024    3:53 AM  CMP  Glucose 70 - 99 mg/dL 816  771  843   BUN 6 - 20 mg/dL 8  9  10   Creatinine 0.44 - 1.00 mg/dL 9.35  9.37  9.19   Sodium 135 - 145 mmol/L 132  131  131   Potassium 3.5 - 5.1 mmol/L 4.6  4.5  4.3   Chloride 98 - 111 mmol/L 96  96  98   CO2 22 - 32 mmol/L 26  25  25    Calcium 8.9 - 10.3 mg/dL 8.1  8.2  8.4   Total Protein 6.5 - 8.1 g/dL 6.8   7.4   Total Bilirubin 0.0 - 1.2 mg/dL 0.4   0.7   Alkaline Phos 38 - 126 U/L 117   135   AST 15 - 41 U/L 18   29   ALT 0 - 44 U/L 19   31     US  EKG SITE RITE Result Date: 08/17/2024 If Site Rite image not attached, placement could not be confirmed due to current cardiac rhythm.    ASSESSMENT/PLAN:  Assessment: Principal Problem:   RUQ abdominal pain Active Problems:   Hypertension   History of pulmonary embolism   Hepatic cyst   Lymphedema of both lower extremities   Morbid obesity with body mass index  of 70 and over in adult [facility])   Intraabdominal fluid collection   Hepatic abscess  Patient Summary: Susan Warren is a 48 y.o. with a pertinent PMH of PE and DVT on warfarin, complex right abdominal cyst, candidemia (2023), HTN, morbid obesity, who presented with SOB and RUQ abdominal pain and admitted for subhepatic abscess.   Plan:  #Recurrent subhepatic abscess #Recurrent hepatic cysts #Hx of right abdominal abscess (2023) Abdominal pain initially resolved after first drainage, but returned as more fluid accumulated. Mild abdominal pain when we evaluated patient at bedside but notes intermittently feeling worse stabbing pain. IR placed drain on 8/25 and initially 3.25L drained. The drain was flushed and replaced with Foley bag and appears to have slowed down. Drain output of 125cc today. First aspiration was 5L from 2.6 x 2.3 x 2.3 cm fluid collection growing Klebsiella only resistant to ampicillin , but susceptible to ampicillin /sulbactam. Consulted ID for assistance regarding antimicrobial coverage. Settled on Unasyn  while the patient is in the hospital. Patient had PICC line placed 8/27 with plans to discharge tomorrow. She is overwhelmed with antibiotics and apprehensive about caring for her IV but will be ready tomorrow. -ID recs:  -restart Unasyn  while inpatient  -plan for discharge is PO flagyl  + IV ceftriaxone  via PICC for 4 weeks (8/27-9/24)  -discontinue zosyn  (i8/22 - 8/25)  -stop antifungals; avoid due to supratherapeutic INR -IR recs:  -s/p drainage of 5L on 08/12/24  -drain placed 08/16/24, drain output appropriate  -outpatient follow up for drain -Pain Regimen:  -Tylenol  1000 mg Q6H PRN -Oxycodone  5 mg Q4H PRN moderate/severe   #Dyspnea, resolved #Sinus tachycardia #Hx of DVT and PE on Warfarin She initially presented with dyspnea but this has since resolved. Has been taking warfarin as prescribed with therapeutic INR. D-dimer 3.88. Unable to obtain CT or V/Q imaging due  to body habitus. TTE shows normal EF and no right heart strain. Wells score of at least 3 but she is adherent to warfarin with INR of 2.8 in goal range. Low suspicion for PE given adequate warfarin use. We are switching to Xarelto  due to concerns for INR given the antibiotics. -Echo: Normal EF, no right heart strain -Continue telemetry -Continue Xarelto  20mg  daily  #Abnormal uterine bleeding #Uterine fibroid #Thickened endometrium Patient reports larger blood clots last night. Hemoglobin still  stable. ParaGard placed in 2003 with unsuccessful removal in 2014. CT venogram of the pelvis shows IUD still in place in 2020 and CT of the abdomen pelvis in 2023 does not but images are suboptimal. OB/GYN consulted for vaginal bleeding. Transvaginal ultrasound here with intramural uterine fibroid and thickened endometrium. Still denies any symptoms of PID. -Continue with planned outpatient follow-up with OB/GYN for endometrial biopsy -If the patient is to go to to the OR under general anesthesia OB/GYN requests to be notified as they may be able to evaluate and retrieve the IUD  #Chronic Anemia, normocytic  #Hx of B12 deficiency  Vaginal bleeding could be cause of anemia. Hgb 8.7, appears stable. Baseline around 10-11. Denies any acute signs of bleeding while on Warfarin. Last B12 low 180. B12 during this admission of 185. Not on supplementation per patient.   -Trend CBC and signs of bleed -Oral vitamin B12 supplementation after drain placed  #Mild hyponatremia Na consistently down-trending from 137 on admission to 132 today. Patient is currently asymptomatic. Could be in the setting of chronic peripheral edema or malnutrition given low albumin. Will continue to trend. -continue to monitor  #Hx of candidemia  04/16/22 Bcx grew Candida parapsilosis and treated with micafungin  then fluconazole  for 14 days. TEE w/o vegetations. Repeat Bcx negative. WBC 14.2. Recent treatment of sinus infection per patient.  Denies any new cough or dysuria. Denies any acute skin infections. Fungal culture from aspiration is negative. ID consulted and recommended holding off on antifungals d/t findings of gram negative rods on stain of abdominal fluid collection.   #AKI, resolved Scr 1.10 on admission, now back to baseline of 0.6-0.8. Likely pre-renal. She received a couple LR boluses. -Trend BMP   #HTN Notes hx of this but is normotensive. No longer on antihypertensives due to insurance change for several months.   #Hepatic steatosis #Morbid obesity LFTs elevated after first drainage, likely due to inflammatory changes s/p IR drainage of hepatic cyst. Today back to normal. She has current and prior imaging showing hepatic steatosis which is likely related to [facility] given significantly elevated BMI. -trend LFTs  Best Practice: Diet: Cardiac diet IVF: Fluids: none VTE: Xarelto  Code: Full  Disposition planning: Therapy Recs: Pending, DME: pending DISPO: Anticipated discharge tomorrow to Home pending clinical improvement.  Signature:  Letha Cheadle, MD [facility]  10:52 AM, 08/18/2024  On Call pager [phone]

## 2024-08-18 NOTE — Progress Notes (Signed)
 Peripherally Inserted Central Catheter Placement  The IV Nurse has discussed with the patient and/or persons authorized to consent for the patient, the purpose of this procedure and the potential benefits and risks involved with this procedure.  The benefits include less needle sticks, lab draws from the catheter, and the patient may be discharged home with the catheter. Risks include, but not limited to, infection, bleeding, blood clot (thrombus formation), and puncture of an artery; nerve damage and irregular heartbeat and possibility to perform a PICC exchange if needed/ordered by physician.  Alternatives to this procedure were also discussed.  Bard Power PICC patient education guide, fact sheet on infection prevention and patient information card has been provided to patient /or left at bedside.    PICC Placement Documentation  PICC Single Lumen 08/18/24 Right Cephalic 43 cm (Active)  Indication for Insertion or Continuance of Line Home intravenous therapies (PICC only) 08/18/24 0926  Exposed Catheter (cm) 0 cm 08/18/24 0926  Site Assessment Clean, Dry, Intact 08/18/24 0926  Line Status Flushed;Saline locked;Blood return noted 08/18/24 0926  Dressing Type Transparent;Securing device 08/18/24 0926  Dressing Status Antimicrobial disc/dressing in place;Clean, Dry, Intact 08/18/24 0926  Line Care Connections checked and tightened 08/18/24 0926  Line Adjustment (NICU/IV Team Only) No 08/18/24 0926  Dressing Intervention New dressing;Adhesive placed at insertion site (IV team only) 08/18/24 0926  Dressing Change Due 08/25/24 08/18/24 0926       Renaee Notice Albarece 08/18/2024, 9:28 AM

## 2024-08-18 NOTE — Discharge Summary (Incomplete)
 Name: CASADY VOSHELL MRN: [id number] DOB: [birth year] 48 y.o. PCP: Brien Belvie BRAVO, MD  Date of Admission: 08/12/2024  4:59 AM Date of Discharge: 08/19/2024  Attending Physician: Dr. Dayton Eastern  Discharge Diagnosis: Principal Problem:   RUQ abdominal pain Active Problems:   Hypertension   History of pulmonary embolism   Hepatic cyst   Lymphedema of both lower extremities   Morbid obesity with body mass index of 70 and over in adult [facility])   Intraabdominal fluid collection   Hepatic abscess Hyponatremia Hypomagnesemia Anemia Vitamin B12 Deficiency Diabetes Mellitus   Discharge Medications: Allergies as of 08/19/2024       Reactions   Adhesive [tape] Anaphylaxis, Swelling   *Adhesive Spray*         Medication List     STOP taking these medications    amoxicillin -clavulanate 875-125 MG tablet Commonly known as: AUGMENTIN    warfarin 5 MG tablet Commonly known as: COUMADIN        TAKE these medications    acetaminophen  325 MG tablet Commonly known as: TYLENOL  Take 2 tablets (650 mg total) by mouth every 6 (six) hours as needed for mild pain (or Fever >/= 101).   cefTRIAXone  IVPB Commonly known as: ROCEPHIN  Inject 2 g into the vein daily for 27 days. Indication: Klebsiella Pneumoniae Intra-abdominal infection First Dose: Yes Last Day of Therapy: 09/13/24 Labs - Once weekly:  CBC/D and BMP, Labs - Once weekly: ESR and CRP Method of administration: IV Push Method of administration may be changed at the discretion of home infusion pharmacist based upon assessment of the patient and/or caregiver's ability to self-administer the medication ordered.   cyanocobalamin  1000 MCG tablet Commonly known as: CVS VITAMIN B12 Take 1 tablet (1,000 mcg total) by mouth daily. What changed: Another medication with the same name was added. Make sure you understand how and when to take each.   cyanocobalamin  1000 MCG tablet Take 1 tablet (1,000 mcg total) by mouth  daily. What changed: You were already taking a medication with the same name, and this prescription was added. Make sure you understand how and when to take each.   diclofenac  Sodium 1 % Gel Commonly known as: VOLTAREN  Apply 4 g topically 4 (four) times daily.   methocarbamol  750 MG tablet Commonly known as: ROBAXIN  Take 1 tablet (750 mg total) by mouth every 6 (six) hours as needed for muscle spasms.   metroNIDAZOLE  500 MG tablet Commonly known as: FLAGYL  Take 1 tablet (500 mg total) by mouth 2 (two) times daily for 27 days.   ondansetron  4 MG disintegrating tablet Commonly known as: ZOFRAN -ODT Take 1 tablet (4 mg total) by mouth every 8 (eight) hours as needed for nausea or vomiting.   oxyCODONE  5 MG immediate release tablet Commonly known as: Oxy IR/ROXICODONE  Take 1 tablet (5 mg total) by mouth every 4 (four) hours as needed for up to 7 days for severe pain (pain score 7-10) or moderate pain (pain score 4-6).   rivaroxaban  20 MG Tabs tablet Commonly known as: Xarelto  Take 1 tablet (20 mg total) by mouth daily with supper.   senna-docusate 8.6-50 MG tablet Commonly known as: Senokot-S Take 1 tablet by mouth at bedtime as needed for mild constipation.   sodium chloride  flush 0.9 % Soln injection Flush 5-10mL of saline into catheter daily to prevent clogging               Discharge Care Instructions  (From admission, onward)  Start     Ordered   08/19/24 0000  Change dressing on IV access line weekly and PRN  (Home infusion instructions - [facility] )        08/19/24 1330   08/19/24 0000  Change dressing (specify)       Comments: It is common to have a small amount of bruising and discomfort in the area where the drainage tube (catheter) was placed. You may also have minor discomfort with movement while the drain is in place.   Flush the drain once per day with 5 mL of 0.9% normal saline (unless you were told otherwise by your healthcare  provider).    Record the amount of drainage from the bag every time you empty it.   Change the dressing every 3 days or earlier if soiled/wet. Keep the skin dry under the dressing.   You may shower with the drain in place. Do not submerge the drain (no baths, swimming, hot tubs, etc.).   08/19/24 1333            Disposition and follow-up:   Ms.Lilliah L Diffley was discharged from [facility] in Good condition.  At the hospital follow up visit please address:  1. IV outpatient antibiotics - ensure the patient is appropriately caring for PICC line and taking IV ceftriaxone  once daily as prescribed. She is also being given PO Flagyl  for her recurrent subhepatic abscess with cultures growing Klebsiella. Plan is to obtain a CT abdomen/pelvis to evaluate fluid collection in 3-4 weeks. We have scheduled her for follow up with Dr. Montie Bologna (ID) on 09/06/24. She will also need follow up with IR for drain removal.  2. She has been passing a few vaginal blood clots while she has been in the hospital. We have scheduled a follow up with OBGYN for endometrial biopsy.  3. Nonsustained Vtach - found to have low Mg levels, was repleted before discharge. Consider rechecking if she is still having flutter symptoms. Inpatient cardiology team said nothing to do before discharge.  4. Her glucose was high while she was hospitalized and with values > 200. A1c elevated to 6.9 and she was started on sliding scale insulin . Please address whether antihyperglycemic medication is needed.  5. Please monitor blood pressure in outpatient setting as she had a few elevated readings during this admission.  6. Labs / imaging needed at time of follow-up: BMP; fasting lipid panel  Follow-up Appointments:  Follow-up Information     [facility] Follow up.   Specialty: Radiology Why: Please contact Interventional Radiology clinic if you develop recurrence of your abdominal symptoms for  ongoing evaluation of hepatic cyst. Contact information: [address] [facility] [place]  [address] [phone]        [facility] Follow up.   Why: will follow for home IV abx needs and RN for PICC line care and lab draws weekly Contact information: [address], [place], [place] [address] Phone: [phone]                Hospital Course by problem list: REGAN MCBRYAR is a 48 y.o. with a pertinent PMH of PE and DVT on warfarin, complex right abdominal cyst, candidemia (2023), HTN, morbid obesity, who presented with SOB and RUQ abdominal pain and was admitted for subhepatic abscess requiring aspiration and drain placement.   #Subhepatic abscess #Recurrent hepatic cysts #Hx of right abdominal abscess (2023) Patient presented with abdominal pain  with nausea but no vomiting or diarrhea. RUQ was tender to palpation with tachycardia but she remained afebrile. Due to her body habitus, we were unable to obtain CT imaging, however, RUQUS showed a 2.6 x 2.3 x 2.3 cm fluid collection. IR aspirated 5L of fluid and sent for gram stain + culture which grew Klebsiella only resistant to ampicillin  monotherapy. She was initially given broad-spectrum antibiotics which were tailored to Unasyn  while inpatient with the help of ID consultation. Her RUQ pain returned which improved after IR placed a drain. She had about 3.25 L of fluid on the day of drain placement, but this slowed down considerably. During hospitalization, her WBC started at 14.2 which was stable with a slight decrease to 11.8 upon discharge. ID recommended IV OPAT with ceftriaxone  for [redacted] weeks along with p.o. Flagyl  with plans to repeat CT abdomen pelvis in about 3-4 weeks. Her pain regimen consisted of prn tylenol  and oxycodone  which she required consistently.   #Nonsustained Vtach #Hypomagnesemia On day of discharge, patient had 13 beats of Vtach with reports of fluttering. Mag level found to be low and  she was repleted before discharge. Cards consulted and did not have any recommendations for discharge: nothing to do.  #Dyspnea, resolved #Sinus tachycardia #Hx of DVT and PE on Warfarin She initially presented with dyspnea but this has since resolved. There was initial concern for PE since unable to get CT or V/Q imaging due to body habitus. D-dimer 3.99 with a Well's score of 3+ but she has been on warfarin with a therapeutic INR. TTE showed normal EF with no signs of right heart strain. She was placed on cardiac telemetry while in the hospital. Her Warfarin dosing decreased during hospital stay and discontinued on discharge with pharmacy assistance. She is being discharged on Xarelto  20mg  daily with her evening meal.   #Abnormal uterine bleeding #Uterine fibroid #Thickened endometrium During hospitalization patient had reports of blood after urinating, unclear if this is from urine or vaginal source. UA showed 50+ RBCs/HPF. She had a ParaGard placed in 2003 with unsuccessful removal in 2014. CT venogram of the pelvis shows IUD still in place in 2020 and CT of the abdomen pelvis in 2023 does not but images are suboptimal. OB/GYN consulted for vaginal bleeding. Transvaginal ultrasound here with intramural uterine fibroid and thickened endometrium. Still denies any symptoms of PID. Plan is for outpatient follow up with OBGYN for endometrial biopsy.    #Chronic Anemia, normocytic  #Hx of B12 deficiency  Vaginal bleeding could be cause of anemia. Hgb down to 8.8 but remained stable. Baseline around 10-11. She is on Warfarin but did not have other bleed symptoms other than potential vaginal bleeding. Last B12 low 180. B12 during this admission of 185. Not on supplementation per patient. She was started on cyanocobalamin .   #Mild hyponatremia Na consistently down-trending from 137 on admission to 133 on day of discharge. Patient is currently asymptomatic. Could be in the setting of chronic  peripheral edema or malnutrition given low albumin. Can be followed outpatient.  #Diabetes Mellitus Patient had consistently elevated morning glucoses with CMP. A1c checked on 08/17/24 was elevated at 6.9. Likely T2DM given morbid obesity. She was placed on sliding scale insulin . This can be followed up outpatient with possible antihyperglycemic agents and lipid panel.  #Hx of candidemia  04/16/22 Bcx grew Candida parapsilosis and treated with micafungin  then fluconazole  for 14 days. TEE w/o vegetations. Repeat Bcx negative. WBC 14.2. Recent treatment of sinus infection per patient.  Denies any new cough or dysuria. Denies any acute skin infections. Fungal culture from aspiration is negative. ID consulted and recommended holding off on antifungals d/t findings of gram negative rods on stain of abdominal fluid collection.   #AKI Scr 1.10 on admission, now back to baseline of 0.6-0.8. Likely pre-renal. She received a couple LR boluses.  #HTN Notes hx of this but mostly stayed normotensive. One elevated reading of 151/60. No longer on antihypertensives due to insurance change for several months. Follow up outpatient.   #Hepatic steatosis #Morbid obesity LFTs elevated after first drainage, likely due to inflammatory changes s/p IR drainage of hepatic cyst. Today back to normal. She has current and prior imaging showing hepatic steatosis which is likely related to [facility] given significantly elevated BMI.   Discharge Subjective: Ms. Pfahler reports large vaginal blood clots but feels otherwise fine. She did note some fluttering symptoms this morning. Her abdominal pain has been intermittent and is not any worse this morning. Patient is medically ready for discharge.  Discharge Exam: BP 128/73 (BP Location: Left Arm)   Pulse 92   Temp 97.7 F (36.5 C) (Oral)   Resp (!) 22   Ht 5' 4 (1.626 m)   Wt (!) 263.5 kg   SpO2 95%   BMI 99.73 kg/m  Constitutional: well-appearing, morbidly obese woman sitting  in hospital bed, in no acute distress HENT: normocephalic atraumatic, mucous membranes moist Eyes: conjunctiva non-erythematous Neck: supple Cardiovascular: regular rate and rhythm, no m/r/g Pulmonary/Chest: normal work of breathing on room air, lungs CTAB Abdominal: soft, non-tender, non-distended; drain in place with no erythema, bruising, purulent drainage from insertion site MSK: normal bulk and tone Neurological: alert & oriented x 3 Skin: warm and dry Psych: Normal mood, thought content appropriate   Pertinent Labs, Studies, and Procedures:     Latest Ref Rng & Units 08/19/2024    6:10 AM 08/18/2024    3:02 AM 08/17/2024    4:11 AM  CBC  WBC 4.0 - 10.5 K/uL 11.8  12.9  12.7   Hemoglobin 12.0 - 15.0 g/dL 8.4  8.3  8.8   Hematocrit 36.0 - 46.0 % 28.1  28.2  29.9   Platelets 150 - 400 K/uL 543  507  539        Latest Ref Rng & Units 08/19/2024    6:10 AM 08/18/2024    3:02 AM 08/17/2024    4:11 AM  CMP  Glucose 70 - 99 mg/dL 792  816  771   BUN 6 - 20 mg/dL 9  8  9    Creatinine 0.44 - 1.00 mg/dL 9.26  9.35  9.37   Sodium 135 - 145 mmol/L 133  132  131   Potassium 3.5 - 5.1 mmol/L 4.4  4.6  4.5   Chloride 98 - 111 mmol/L 96  96  96   CO2 22 - 32 mmol/L 26  26  25    Calcium 8.9 - 10.3 mg/dL 8.3  8.1  8.2   Total Protein 6.5 - 8.1 g/dL 7.1  6.8    Total Bilirubin 0.0 - 1.2 mg/dL 0.4  0.4    Alkaline Phos 38 - 126 U/L 101  117    AST 15 - 41 U/L 15  18    ALT 0 - 44 U/L 15  19      US  Abdomen Limited RUQ (LIVER/GB) Addendum Date: 08/16/2024 ADDENDUM REPORT: 08/16/2024 10:26 ADDENDUM: The mass measures 25.7 x 23.1 x 22.7 cm, not 2.6 x 2.3  x 2.3 cm. Electronically Signed   By: Elspeth Bathe M.D.   On: 08/16/2024 10:26   Result Date: 08/16/2024 CLINICAL DATA:  Right upper quadrant abdominal pain. Status post ultrasound-guided drainage of a large cystic mass inferior to the liver on 04/22/2022 with no organism growth on culture of the fluid and rare white blood cells in the  fluid. There was no malignancy on cytological evaluation of the fluid. The cytological evaluation did demonstrate numerous acute inflammatory cells and necrotic debris. EXAM: ULTRASOUND ABDOMEN LIMITED RIGHT UPPER QUADRANT COMPARISON:  Ultrasound-guided abdominal cyst aspiration dated 04/16/2022. Abdomen and pelvis CT dated 04/16/2022. FINDINGS: Gallbladder: Surgically absent. Common bile duct: Diameter: 5.0 mm Liver: No focal lesion identified. Within normal limits in parenchymal echogenicity. Portal vein is patent on color Doppler imaging with normal direction of blood flow towards the liver. Other: There has been re-accumulation of fluid containing diffuse internal echoes in the previously aspirated large cystic mass inferior to the liver. This measures 2.6 x 2.3 x 2.3 cm. No internal blood flow was seen with color Doppler. IMPRESSION: 1. Re-accumulation of fluid containing diffuse internal echoes in the previously aspirated large cystic mass inferior to the liver. This measures 2.6 x 2.3 x 2.3 cm. This is compatible with a recurrent complicated cyst of unknown origin based on the previous cytology and culture results. 2. Status post cholecystectomy. 3. Otherwise, normal examination. Electronically Signed: By: Elspeth Bathe M.D. On: 08/12/2024 10:44   US  Abdomen Limited RUQ (LIVER/GB) Result Date: 08/13/2024 CLINICAL DATA:  Follow-up cystic liver lesion following needle aspiration. EXAM: ULTRASOUND ABDOMEN LIMITED COMPARISON:  08/12/2024 FINDINGS: A thick-walled cystic lesion is seen involving the liver. This currently measures 17.5 x 15.0 x 17.6 cm, compared to 25.7 x 23.7 x 23.0 cm previously. IMPRESSION: Large complex thick-walled cystic lesion involving the liver shows mild decrease in size since previous study. Electronically Signed   By: Norleen DELENA Kil M.D.   On: 08/13/2024 15:47   IR US  Guide Bx Asp/Drain Result Date: 08/13/2024 INDICATION: Chronic very large cystic abdominal mass presumed to be a  recurrent exophytic liver cyst EXAM: ULTRASOUND ASPIRATION AND DRAINAGE OF THE LARGE ABDOMINAL CYSTIC MASS MEDICATIONS: The patient is currently admitted to the hospital and receiving intravenous antibiotics. The antibiotics were administered within an appropriate time frame prior to the initiation of the procedure. ANESTHESIA/SEDATION: None. COMPLICATIONS: None immediate. PROCEDURE: Informed written consent was obtained from the patient after a thorough discussion of the procedural risks, benefits and alternatives. All questions were addressed. Maximal Sterile Barrier Technique was utilized including caps, mask, sterile gowns, sterile gloves, sterile drape, hand hygiene and skin antiseptic. A timeout was performed prior to the initiation of the procedure. Previous imaging reviewed. Preliminary ultrasound performed. The large abdominal cystic mass was localized and marked percutaneous needle access in the midline. Under sterile conditions and local anesthesia, the cystic lesion was accessed with a 6 French Safe-T-Centesis needle catheter. Needle position confirmed with ultrasound. Images obtained for documentation. Suction aspiration yielded 5 L of debris-filled brown cystic fluid. Sample sent for culture and cytology. Postprocedure imaging demonstrates significant collapse of the cystic lesion. No immediate complication. Patient tolerated the procedure well. IMPRESSION: Successful ultrasound aspiration of the large abdominal cystic mass. 5 L of debris-filled brown cystic fluid removed. Sample sent for culture and cytology. Electronically Signed   By: CHRISTELLA.  Shick M.D.   On: 08/13/2024 07:57   ECHOCARDIOGRAM COMPLETE Result Date: 08/12/2024    ECHOCARDIOGRAM REPORT   Patient Name:   Nanetta LITTIE MOLT  Date of Exam: 08/12/2024 Medical Rec #:  [id number]      Height:       64.0 in Accession #:    [id number]     Weight:       581.0 lb Date of Birth:  Sep 05, 1976      BSA:          3.078 m Patient Age:    48 years       BP:            131/73 mmHg Patient Gender: F              HR:           104 bpm. Exam Location:  Inpatient Procedure: 2D Echo, Cardiac Doppler, Color Doppler and Intracardiac            Opacification Agent (Both Spectral and Color Flow Doppler were            utilized during procedure). Indications:    Syncope  History:        Patient has prior history of Echocardiogram examinations, most                 recent 04/17/2022. Risk Factors:Hypertension.  Sonographer:    Therisa Crouch Referring Phys: [id number] ELSIE KATHEE SAVANNAH  Sonographer Comments: Image acquisition challenging due to patient body habitus. IMPRESSIONS  1. Left ventricular ejection fraction, by estimation, is 60 to 65%. The left ventricle has normal function. The left ventricle has no regional wall motion abnormalities. Left ventricular diastolic parameters are consistent with Grade I diastolic dysfunction (impaired relaxation).  2. Right ventricular systolic function is normal. The right ventricular size is normal.  3. The mitral valve is normal in structure. Trivial mitral valve regurgitation. No evidence of mitral stenosis.  4. The aortic valve is tricuspid. There is mild calcification of the aortic valve. Aortic valve regurgitation is not visualized. No aortic stenosis is present.  5. The inferior vena cava is normal in size with greater than 50% respiratory variability, suggesting right atrial pressure of 3 mmHg. Conclusion(s)/Recommendation(s): On subcostal imaging there is a large cystic structure adjacent to the liver. This has been previously noted on abdoimant CT imaging. FINDINGS  Left Ventricle: Left ventricular ejection fraction, by estimation, is 60 to 65%. The left ventricle has normal function. The left ventricle has no regional wall motion abnormalities. The left ventricular internal cavity size was normal in size. There is  no left ventricular hypertrophy. Left ventricular diastolic parameters are consistent with Grade I diastolic dysfunction  (impaired relaxation). Right Ventricle: The right ventricular size is normal. No increase in right ventricular wall thickness. Right ventricular systolic function is normal. Left Atrium: Left atrial size was normal in size. Right Atrium: Right atrial size was normal in size. Pericardium: There is no evidence of pericardial effusion. Mitral Valve: The mitral valve is normal in structure. Mild mitral annular calcification. Trivial mitral valve regurgitation. No evidence of mitral valve stenosis. Tricuspid Valve: The tricuspid valve is normal in structure. Tricuspid valve regurgitation is trivial. No evidence of tricuspid stenosis. Aortic Valve: The aortic valve is tricuspid. There is mild calcification of the aortic valve. Aortic valve regurgitation is not visualized. No aortic stenosis is present. Pulmonic Valve: The pulmonic valve was normal in structure. Pulmonic valve regurgitation is not visualized. No evidence of pulmonic stenosis. Aorta: The aortic root is normal in size and structure. Venous: The inferior vena cava is normal in size with greater than 50% respiratory variability, suggesting right  atrial pressure of 3 mmHg. IAS/Shunts: No atrial level shunt detected by color flow Doppler.  LEFT VENTRICLE PLAX 2D LVIDd:         4.88 cm   Diastology LVIDs:         2.97 cm   LV e' medial:    7.83 cm/s LV PW:         1.31 cm   LV E/e' medial:  11.3 LV IVS:        1.29 cm   LV e' lateral:   14.30 cm/s LVOT diam:     2.13 cm   LV E/e' lateral: 6.2 LVOT Area:     3.56 cm  RIGHT VENTRICLE             IVC RV S prime:     16.50 cm/s  IVC diam: 1.91 cm TAPSE (M-mode): 1.7 cm LEFT ATRIUM             Index LA diam:        4.56 cm 1.48 cm/m LA Vol (A2C):   48.7 ml 15.82 ml/m LA Vol (A4C):   61.1 ml 19.85 ml/m LA Biplane Vol: 55.8 ml 18.13 ml/m   AORTA Ao Root diam: 3.19 cm Ao Asc diam:  2.81 cm MITRAL VALVE MV Area (PHT): 5.54 cm     SHUNTS MV Decel Time: 137 msec     Systemic Diam: 2.13 cm MV E velocity: 88.80 cm/s MV  A velocity: 108.00 cm/s MV E/A ratio:  0.82 Toribio Fuel MD Electronically signed by Toribio Fuel MD Signature Date/Time: 08/12/2024/1:20:52 PM    Final    DG Chest Portable 1 View Result Date: 08/12/2024 CLINICAL DATA:  Chest pain. Tachycardia. History of pulmonary embolism. EXAM: PORTABLE CHEST - 1 VIEW COMPARISON:  04/07/2022 FINDINGS: Cardiomediastinal silhouette and pulmonary vasculature are within normal limits. RIGHT basilar opacity may be atelectasis or pneumonia. Lungs are otherwise clear. IMPRESSION: RIGHT basilar opacity may be due to atelectasis or pneumonia. Electronically Signed   By: Aliene Lloyd M.D.   On: 08/12/2024 09:24     Discharge Instructions:   Discharge Instructions      To ARMANIE ULLMER or their caretakers,  You were recently admitted to [facility] for an abscess below your liver. The fluid collection is likely due to an infection and you will need intravenous antibiotic therapy for 4 weeks. You will need to follow up with Interventional Radiology for your drain care. We discussed with the Cardiology team (heart doctors) regarding your flutter symptoms this morning. They are not concerned about the episode you had this morning and think it may be related to the low magnesium  levels you had.  Continue taking your home medications with the following changes:  Start taking Ceftriaxone  (Rocephin ) 2g IV once daily Metronidazole  (Flagyl ) 500mg  twice daily Cyanocobalamin  (Vitamin B12) 1000mcg daily Xarelto  20mg  once daily Stop taking Warfarin 5mg  tablet MWF and 2.5mg  other days Maalox/Mylanta (alum & mag) suspension Augmentin  (amoxicillin /clavulunate) Continue taking Acetaminophen  (Tylenol ) 650mg  every 6 hours as needed Oxycodone  IR 5mg  every 4 hours as needed Cyanocobalamin  (Vitamin B12) 1000mcg daily Ondansetron  (Zofran ) 4mg  as needed for nausea Senna-docusate (senokot) 8.6-50mg  at bedtime as needed Methocarbamol  (Robaxin ) 750mg  every 6 hours as  needed   You should seek further medical care if you notice increased bleeding (coughing up blood, increased vaginal bleeding, rectal bleeding, etc.) or chest pain, shortness of breath, leg swelling. If your abdominal pain worsens, please seek medical care. If you start having a fever  and redness near your catheter site, please seek medical care.  Please follow up with the following doctors/specialties: Infectious Diseases - you have an appointment on 09/06/24 with Dr. Montie Bologna. Obstetrics/Gynecology - you have an appointment on 08/31/24 with Dr. Vina Solian Interventional Radiology - please schedule an appointment with [facility] for removal of your drain.  We recommend that you also see your primary care doctor in about a week to make sure that you continue to improve. We are so glad that you are feeling better.  Sincerely,  [facility]    Interventional Radiology Percutaneous Drain Placement After Care   This sheet gives you information about how to care for yourself after your procedure. Your health care provider may also give you more specific instructions. Your drain was placed by an interventional radiologist with [facility]. If you have questions or concerns, contact [facility] at [phone].   What is a percutaneous drain?   A drain is a small plastic tube (catheter) that goes into the fluid collection in your body through your skin.   How long will I need the drain?   How long the drain needs to stay in is determined by where the drain is, how much comes out of the drain each day and if you are having any other surgical procedures.   Interventional radiology will determine when it is time to remove the drain. It is important to follow up as directed so that the drain can be removed as soon as it is safe to do so.   What can I expect after the procedure?   After the procedure, it is common to have:   A small  amount of bruising and discomfort in the area where the drainage tube (catheter) was placed.   Sleepiness and fatigue. This should go away after the medicines you were given have worn off.   Follow these instructions at home:   Insertion site care   Check your insertion site when you change the bandage. Check for:   More redness, swelling, or pain.   More fluid or blood.   Warmth.   Pus or a bad smell.   When caring for your insertion site:   Wash your hands with soap and water for at least 20 seconds before and after you change your bandage (dressing). If soap and water are not available, use hand sanitizer.   You do not need to change your dressing everyday if it is clean and dry. Change your dressing every 3 days or as needed when it is soiled, wet or becoming dislodged. You will need to change your dressing each time you shower.   Leave stitches (sutures), skin glue, or adhesive strips in place. These skin closures may need to stay in place for 2 weeks or longer. If adhesive strip edges start to loosen and curl up, you may trim the loose edges. Do not remove adhesive strips completely unless your health care provider tells you to do so.   Catheter care   Flush the catheter once per day with 5 mL of 0.9% normal saline unless you are told otherwise by your healthcare provider. This helps to prevent clogs in the catheter.   To disconnect the drain, turn the clear plastic tube to the left. Attach the saline syringe by placing it on the white end of the drain and turning gently to the right. Once attached gently push the plunger to the 5 mL mark. After you  are done flushing, disconnect the syringe by turning to the left and reattach your drainage container   Check for fluid leaking from around your catheter (instead of fluid draining through your catheter). This may be a sign that the drain is no longer working correctly.   Write down the following information every time you empty  your bag:   The date and time.   The amount of drainage.   Activity   Rest at home for 1-2 days after your procedure.   For the first 48 hours do not lift anything more than 10 lbs (about a gallon of milk). You may perform moderate activities/exercise. Please avoid strenuous activities during this time.   Avoid any activities which may pull on your drain as this can cause your drain to become dislodged.   If you were given a sedative during the procedure, it can affect you for several hours. Do not drive or operate machinery until your health care provider says that it is safe.   General instructions   For mild pain take over-the-counter medications as needed for pain such as Tylenol  or Advil . If you are experiencing severe pain please call our office as this may indicate an issue with your drain.    If you were prescribed an antibiotic medicine, take it as told by your health care provider. Do not stop using the antibiotic even if you start to feel better.   You may shower 24 hours after the drain is placed. To do this cover the insertion site with a water tight material such as saran wrap and seal the edges with tape, you may also purchase waterproof dressings at your local drug store. Shower as usual and then remove the water tight dressing and any gauze/tape underneath it once you have exited the shower and dried off. Allow the area to air dry or pat dry with a clean towel. Once the skin is completely dry place a new gauze dressing. It is important to keep the site dry at all times to prevent infection.   Do not submerge the drain - this means you cannot take baths, swim, use a hot tub, etc. until the drain is removed.    Do not use any products that contain nicotine or tobacco, such as cigarettes, e-cigarettes, and chewing tobacco. If you need help quitting, ask your health care provider.   Keep all follow-up visits as told by your health care provider. This is important.   Contact  a health care provider if:   You have less than 10 mL of drainage a day for 2-3 days in a row, or as directed by your health care provider.   You have any of these signs of infection:   More redness, swelling, or pain around your incision area.   More fluid or blood coming from your incision area.   Warmth coming from your incision area.   Pus or a bad smell coming from your incision area.   You have fluid leaking from around your catheter (instead of through your catheter).   You are unable to flush the drain.   You have a fever or chills.   You have pain that does not get better with medicine.   You have not been contacted to schedule a drain follow up appointment within 10 days of discharge from the hospital.   Please call [facility]iology at [phone] with any questions or concerns.   Get help right away if:   Your catheter  comes out.   You suddenly stop having drainage from your catheter.   You suddenly have blood in the fluid that is draining from your catheter.   You become dizzy or you faint.   You develop a rash.   You have nausea or vomiting.   You have difficulty breathing or you feel short of breath.   You develop chest pain.   You have problems with your speech or vision.   You have trouble balancing or moving your arms or legs.   Summary   It is common to have a small amount of bruising and discomfort in the area where the drainage tube (catheter) was placed. You may also have minor discomfort with movement while the drain is in place.   Flush the drain once per day with 5 mL of 0.9% normal saline (unless you were told otherwise by your healthcare provider).    Record the amount of drainage from the bag every time you empty it.   Change the dressing every 3 days or earlier if soiled/wet. Keep the skin dry under the dressing.   You may shower with the drain in place. Do not submerge the drain (no baths, swimming, hot tubs, etc.).    Contact [facility] at [phone] if you have more redness, swelling, or pain around your incision area or if you have pain that does not get better with medicine.   This information is not intended to replace advice given to you by your health care provider. Make sure you discuss any questions you have with your health care provider.   Interventional Radiology Drain Record   Empty your drain at least once per day. You may empty it as often as needed. Use this form to write down the amount of fluid that has collected in the drainage container. Bring this form with you to your follow-up visits. Please call [facility] at [phone] with any questions or concerns prior to your appointment.   Drain #1 location: ___________________   Date __________ Time __________ Amount __________   Date __________ Time __________ Amount __________   Date __________ Time __________ Amount __________   Date __________ Time __________ Amount __________   Date __________ Time __________ Amount __________   Date __________ Time __________ Amount __________   Date __________ Time __________ Amount __________   Date __________ Time __________ Amount __________   Date __________ Time __________ Amount __________   Date __________ Time __________ Amount __________   Date __________ Time __________ Amount __________   Date __________ Time __________ Amount __________   Date __________ Time __________ Amount __________   Date __________ Time __________ Amount __________        Signed:  Letha Cheadle, MD Internal Medicine Resident, PGY-1 08/19/2024, 1:52 PM Please contact the on call pager after 5 pm and on weekends at [phone].

## 2024-08-18 NOTE — Progress Notes (Signed)
 Patient ID: Susan Warren, female   DOB: 05-03-1976, 48 y.o.   MRN: [id number]  IR contacted by medicine team regarding patient questions involving hepatic cyst/abscess drain. Went to evaluate patient and her primary question was involving changing drain to standard gravity leg bag instead of foley bag. She currently only has 50 mL in foley bag at bedside. Given this amount of output, I feel it is reasonable to switch to standard gravity drain bag for size convenience. Pt also wondering how to drain the standard gravity bag, which I informed her was a twist valve at the bottom, and a nurse can show her before discharge. She voices she feels comfortable draining, flushing once daily, and overall managing the drain at home.  Inpatient team made aware. Planning for discharge tomorrow. IR outpatient f/u order and flushes already in place.   Please reach out to IR with any additional questions or concerns.  Susan DEL Kaye Mitro PA-C 08/18/2024 3:27 PM

## 2024-08-18 NOTE — Progress Notes (Addendum)
 Paged by RN about patient passing vaginal blood clot. Patient is evaluated at bedside, resting comfortably without any acute distress. Patient reports that she has been intermittently passing clots since last Friday, mostly happens when she urinates. Denies any abdominal pain or adnexal tenderness. Notes that the clot today was the largest compared to prior. She also reports that she has an IUD that was placed ~2011 and it was not removed 2014; therefore she still has the IUD, confirmed by CT 2023. OBGYN consulted during this admission, US  pelvic showed thickened endometrium and intramural uterine fibroid. Overall, patient is stable. Will notify the OBGYN team in the AM and continue to trend CBC.

## 2024-08-18 NOTE — Progress Notes (Signed)
 Mobility Specialist: Progress Note   08/18/24 1500  Mobility  Activity Refused and notified nurse if applicable  Mobility Specialist Start Time (ACUTE ONLY) 1534    Pt politely declined OOB mobility stating she had been up during the day to the BR and back. MS encouraged hallway ambulation but pt said she didn't feel like it at this time. Left in bed.   Susan Warren Mobility Specialist Please contact via SecureChat or Rehab office at [phone]

## 2024-08-19 ENCOUNTER — Other Ambulatory Visit (HOSPITAL_COMMUNITY): Payer: Self-pay

## 2024-08-19 ENCOUNTER — Other Ambulatory Visit: Payer: Self-pay

## 2024-08-19 DIAGNOSIS — R7309 Other abnormal glucose: Secondary | ICD-10-CM

## 2024-08-19 LAB — COMPREHENSIVE METABOLIC PANEL WITH GFR
ALT: 15 U/L (ref 0–44)
AST: 15 U/L (ref 15–41)
Albumin: 1.5 g/dL — ABNORMAL LOW (ref 3.5–5.0)
Alkaline Phosphatase: 101 U/L (ref 38–126)
Anion gap: 11 (ref 5–15)
BUN: 9 mg/dL (ref 6–20)
CO2: 26 mmol/L (ref 22–32)
Calcium: 8.3 mg/dL — ABNORMAL LOW (ref 8.9–10.3)
Chloride: 96 mmol/L — ABNORMAL LOW (ref 98–111)
Creatinine, Ser: 0.73 mg/dL (ref 0.44–1.00)
GFR, Estimated: >60 mL/min (ref >60)
Glucose, Bld: 207 mg/dL — ABNORMAL HIGH (ref 70–99)
Potassium: 4.4 mmol/L (ref 3.5–5.1)
Sodium: 133 mmol/L — ABNORMAL LOW (ref 135–145)
Total Bilirubin: 0.4 mg/dL (ref 0.0–1.2)
Total Protein: 7.1 g/dL (ref 6.5–8.1)

## 2024-08-19 LAB — CBC
HCT: 28.1 % — ABNORMAL LOW (ref 36.0–46.0)
Hemoglobin: 8.4 g/dL — ABNORMAL LOW (ref 12.0–15.0)
MCH: 24.5 pg — ABNORMAL LOW (ref 26.0–34.0)
MCHC: 29.9 g/dL — ABNORMAL LOW (ref 30.0–36.0)
MCV: 81.9 fL (ref 80.0–100.0)
Platelets: 543 K/uL — ABNORMAL HIGH (ref 150–400)
RBC: 3.43 MIL/uL — ABNORMAL LOW (ref 3.87–5.11)
RDW: 16.7 % — ABNORMAL HIGH (ref 11.5–15.5)
WBC: 11.8 K/uL — ABNORMAL HIGH (ref 4.0–10.5)
nRBC: 0 % (ref 0.0–0.2)

## 2024-08-19 LAB — GLUCOSE, CAPILLARY
Glucose-Capillary: 183 mg/dL — ABNORMAL HIGH (ref 70–99)
Glucose-Capillary: 187 mg/dL — ABNORMAL HIGH (ref 70–99)

## 2024-08-19 LAB — MAGNESIUM: Magnesium: 1.5 mg/dL — ABNORMAL LOW (ref 1.7–2.4)

## 2024-08-19 MED ORDER — METRONIDAZOLE 500 MG PO TABS
500.0000 mg | ORAL_TABLET | Freq: Two times a day (BID) | ORAL | 0 refills | Status: AC
  Filled 2024-08-19: qty 54, 27d supply, fill #0

## 2024-08-19 MED ORDER — CEFTRIAXONE IV (FOR PTA / DISCHARGE USE ONLY): 2.0000 g | INTRAVENOUS | 0 refills | Status: AC

## 2024-08-19 MED ORDER — HEPARIN SOD (PORK) LOCK FLUSH 100 UNIT/ML IV SOLN
250.0000 [IU] | INTRAVENOUS | Status: AC | PRN
  Administered 2024-08-19: 250 [IU]

## 2024-08-19 MED ORDER — METHOCARBAMOL 750 MG PO TABS
750.0000 mg | ORAL_TABLET | Freq: Four times a day (QID) | ORAL | 0 refills | Status: AC | PRN
  Filled 2024-08-19: qty 60, 15d supply, fill #0

## 2024-08-19 MED ORDER — SENNOSIDES-DOCUSATE SODIUM 8.6-50 MG PO TABS: 1.0000 | ORAL_TABLET | Freq: Every evening | ORAL | Status: DC | PRN

## 2024-08-19 MED ORDER — CYANOCOBALAMIN 1000 MCG PO TABS
1000.0000 ug | ORAL_TABLET | Freq: Every day | ORAL | 0 refills | Status: DC
  Filled 2024-08-19: qty 30, 30d supply, fill #0

## 2024-08-19 MED ORDER — ONDANSETRON 4 MG PO TBDP
4.0000 mg | ORAL_TABLET | Freq: Three times a day (TID) | ORAL | 0 refills | Status: DC | PRN
  Filled 2024-08-19: qty 20, 7d supply, fill #0

## 2024-08-19 MED ORDER — MAGNESIUM SULFATE 4 GM/100ML IV SOLN
4.0000 g | Freq: Once | INTRAVENOUS | Status: AC
  Administered 2024-08-19: 4 g via INTRAVENOUS
  Filled 2024-08-19: qty 100

## 2024-08-19 MED ORDER — RIVAROXABAN 20 MG PO TABS
20.0000 mg | ORAL_TABLET | Freq: Every day | ORAL | 0 refills | Status: DC
  Filled 2024-08-19: qty 30, 30d supply, fill #0

## 2024-08-19 MED ORDER — DICLOFENAC SODIUM 1 % EX GEL
4.0000 g | Freq: Four times a day (QID) | CUTANEOUS | 0 refills | Status: DC
  Filled 2024-08-19: qty 100, 7d supply, fill #0

## 2024-08-19 MED ORDER — OXYCODONE HCL 5 MG PO TABS
5.0000 mg | ORAL_TABLET | ORAL | 0 refills | Status: AC | PRN
  Filled 2024-08-19: qty 30, 5d supply, fill #0

## 2024-08-19 NOTE — Progress Notes (Signed)
  IR BRIEF PROGRESS NOTE:  I rounded on patient and switched out her current foley bag to a leg bag drain, as previously discussed and coordinated between my colleague, Kimble Clas PA-C, and patient. No new concerns on examination from last seen, and patient continues to tolerate her drain, with expected tenderness noted. I demonstrated to patient how to disconnect and reconnect the new gravity bag, as well as how to drain the bag through the valve closure at the bottom of the gravity bag. Patient voiced understanding, and had no further concerns. Approximately 150 mL in foley bag at time of bag exchange. RN updated.    Electronically Signed: Carlin DELENA Griffon, PA-C 08/19/2024, 4:09 PM

## 2024-08-19 NOTE — Progress Notes (Addendum)
 Contacted Dr. Rosan via secure chat regarding B12 order for home on med detail list.   Per Dr. Rosan, will be fine, we will correct that order, thanks.  PICC line flushed by IV team.  Patient alert oriented in good spirits. Pt received TOC meds at bedside.  Paper prescription for Rocephin  IVPB given to patient with AVS. Per Medical team, Fairy Pool, DO through Secure chat.  ceftriaxone  will be delivered to her house by [facility]. metronidazole  should be at toc. Patient verbalized understand of ABX delivery.  Work Public house manager and given to patient as requested.  Mom/Ride at bedside.  Will discharge from unit.

## 2024-08-19 NOTE — Progress Notes (Signed)
 PT Cancellation Note  Patient Details Name: Susan Warren MRN: [id number] DOB: 1975-12-29   Cancelled Treatment:    Reason Eval/Treat Not Completed: (P) Patient declined, no reason specified (pt preparing for DC, RN assisting her, pt denies need for assist with getting OOB to prepare for discharge.)   Connell HERO Able Malloy 08/19/2024, 3:02 PM

## 2024-08-19 NOTE — TOC Transition Note (Signed)
 Transition of Care (TOC) - Discharge Note Rayfield Gobble RN, BSN Inpatient Care Management Unit 4E- RN Case Manager See Treatment Team for direct phone #   Patient Details  Name: Susan Warren MRN: [id number] Date of Birth: 1976/11/05  Transition [facility] CM/SW Contact:  [name] Hurst, RN Phone Number: 08/19/2024, 2:02 PM   Clinical Narrative:    Pt stable for transition home today. Home IV abx has been arranged w/ [facility]- liaison- Pam has completed bedside education with pt and will coordinate delivery of home abx to the home. [facility] RN will follow pt in the home for PICC line care and weekly lab draws.  Liaison updated on discharge today.   Family to transport home.   No DME needs, pt will follow up with outpt needs for lymphedema after completion of abx.   IP CM interventions completed for discharge, no further needs noted.    Final next level of care: Home w Home Health Services Barriers to Discharge: Barriers Resolved   Patient Goals and CMS Choice Patient states their goals for this hospitalization and ongoing recovery are:: return home with family   Choice offered to / list presented to : Patient      Discharge Placement                 Home w/ [facility]      Discharge Plan and Services Additional resources added to the After Visit Summary for     Discharge Planning Services: CM Consult Post Acute Care Choice: Home Health          DME Arranged: N/A DME Agency: NA       HH Arranged: RN, IV Antibiotics HH Agency: [facility] Date HH Agency Contacted: 08/17/24 Time HH Agency Contacted: 1445 Representative spoke with at [facility] Agency: Pam  Social Drivers of Health (SDOH) Interventions SDOH Screenings   Food Insecurity: Food Insecurity Present (12/11/2023)  Housing: Low Risk  (12/11/2023)  Transportation Needs: No Transportation Needs (12/11/2023)  Alcohol Screen: Low Risk  (12/11/2023)  Depression (PHQ2-9): Low Risk   (06/17/2022)  Recent Concern: Depression (PHQ2-9) - Medium Risk (05/06/2022)  Financial Resource Strain: Low Risk  (12/11/2023)  Physical Activity: Insufficiently Active (12/11/2023)  Social Connections: Unknown (12/11/2023)  Stress: No Stress Concern Present (12/11/2023)  Tobacco Use: Low Risk  (12/11/2023)     Readmission Risk Interventions    08/19/2024    2:02 PM  Readmission Risk Prevention Plan  Post Dischage Appt Complete  Medication Screening Complete  Transportation Screening Complete

## 2024-08-22 ENCOUNTER — Other Ambulatory Visit: Payer: Self-pay

## 2024-08-22 ENCOUNTER — Emergency Department (HOSPITAL_COMMUNITY)
Admission: EM | Admit: 2024-08-22 | Discharge: 2024-08-22 | Disposition: A | Discharge status: Home / Self Care | Attending: Emergency Medicine | Admitting: Emergency Medicine

## 2024-08-22 ENCOUNTER — Emergency Department (HOSPITAL_COMMUNITY)

## 2024-08-22 ENCOUNTER — Encounter (HOSPITAL_COMMUNITY): Payer: Self-pay

## 2024-08-22 DIAGNOSIS — Z7901 Long term (current) use of anticoagulants: Secondary | ICD-10-CM | POA: Insufficient documentation

## 2024-08-22 DIAGNOSIS — K750 Abscess of liver: Secondary | ICD-10-CM

## 2024-08-22 DIAGNOSIS — R42 Dizziness and giddiness: Secondary | ICD-10-CM | POA: Diagnosis not present

## 2024-08-22 DIAGNOSIS — R202 Paresthesia of skin: Secondary | ICD-10-CM | POA: Insufficient documentation

## 2024-08-22 DIAGNOSIS — R1011 Right upper quadrant pain: Secondary | ICD-10-CM | POA: Diagnosis present

## 2024-08-22 DIAGNOSIS — K651 Peritoneal abscess: Secondary | ICD-10-CM

## 2024-08-22 DIAGNOSIS — R109 Unspecified abdominal pain: Secondary | ICD-10-CM | POA: Diagnosis not present

## 2024-08-22 LAB — CBC
HCT: 31.9 % — ABNORMAL LOW (ref 36.0–46.0)
Hemoglobin: 9.2 g/dL — ABNORMAL LOW (ref 12.0–15.0)
MCH: 24.3 pg — ABNORMAL LOW (ref 26.0–34.0)
MCHC: 28.8 g/dL — ABNORMAL LOW (ref 30.0–36.0)
MCV: 84.2 fL (ref 80.0–100.0)
Platelets: 608 K/uL — ABNORMAL HIGH (ref 150–400)
RBC: 3.79 MIL/uL — ABNORMAL LOW (ref 3.87–5.11)
RDW: 17.2 % — ABNORMAL HIGH (ref 11.5–15.5)
WBC: 10.2 K/uL (ref 4.0–10.5)
nRBC: 0 % (ref 0.0–0.2)

## 2024-08-22 LAB — COMPREHENSIVE METABOLIC PANEL WITH GFR
ALT: 14 U/L (ref 0–44)
AST: 17 U/L (ref 15–41)
Albumin: 1.9 g/dL — ABNORMAL LOW (ref 3.5–5.0)
Alkaline Phosphatase: 91 U/L (ref 38–126)
Anion gap: 13 (ref 5–15)
BUN: 14 mg/dL (ref 6–20)
CO2: 24 mmol/L (ref 22–32)
Calcium: 8.8 mg/dL — ABNORMAL LOW (ref 8.9–10.3)
Chloride: 98 mmol/L (ref 98–111)
Creatinine, Ser: 1 mg/dL (ref 0.44–1.00)
GFR, Estimated: >60 mL/min (ref >60)
Glucose, Bld: 171 mg/dL — ABNORMAL HIGH (ref 70–99)
Potassium: 3.9 mmol/L (ref 3.5–5.1)
Sodium: 135 mmol/L (ref 135–145)
Total Bilirubin: 0.4 mg/dL (ref 0.0–1.2)
Total Protein: 7.4 g/dL (ref 6.5–8.1)

## 2024-08-22 LAB — MAGNESIUM: Magnesium: 1.5 mg/dL — ABNORMAL LOW (ref 1.7–2.4)

## 2024-08-22 LAB — LIPASE, BLOOD: Lipase: 42 U/L (ref 11–51)

## 2024-08-22 MED ORDER — ACETAMINOPHEN 500 MG PO TABS
1000.0000 mg | ORAL_TABLET | Freq: Once | ORAL | Status: AC
  Administered 2024-08-22: 1000 mg via ORAL
  Filled 2024-08-22: qty 2

## 2024-08-22 MED ORDER — MAGNESIUM OXIDE -MG SUPPLEMENT 400 (240 MG) MG PO TABS
400.0000 mg | ORAL_TABLET | Freq: Once | ORAL | Status: AC
  Administered 2024-08-22: 400 mg via ORAL
  Filled 2024-08-22: qty 1

## 2024-08-22 MED ORDER — IOHEXOL 350 MG/ML SOLN
100.0000 mL | Freq: Once | INTRAVENOUS | Status: AC | PRN
  Administered 2024-08-22: 100 mL via INTRAVENOUS

## 2024-08-22 NOTE — ED Triage Notes (Signed)
 PT BIB EMS FROM HOME WITH C/O ABD PAIN X2 HOURS. PT HAD A PROCEDURE 08/21 AND ENDORSES PAIN AT THE SITE

## 2024-08-22 NOTE — ED Notes (Signed)
 Patient transported to CT

## 2024-08-22 NOTE — ED Notes (Signed)
 Pt in [place]

## 2024-08-22 NOTE — Discharge Instructions (Signed)
 Keep your follow-up appointments as previously scheduled. Continue your antibiotic regimen as directed at the time of your recent hospital discharge. Return to the emergency department if your symptoms worsen.

## 2024-08-22 NOTE — Hospital Course (Signed)
 Feels like something is poking her in her RLQ. No more output from her drain, around the same. 4AM this morning, immediately woke her up. No nausea or fever, some lightheadedness as well. Even when she is laying down. Has been eating ok.  Rightarm numbmess, started this morning. No weakness anywhere. Didn't lsoe consciousness. Starting to get one here. Was sleepign wel. Using the bathroom ok, no diarrhea or pain with bowel movements.

## 2024-08-22 NOTE — ED Provider Notes (Signed)
 [place] EMERGENCY DEPARTMENT AT [facility] Provider Note   CSN: [id number] Arrival date & time: 08/22/24  0559     Patient presents with: Abdominal Pain   Susan Warren is a 48 y.o. female.   48 year old female presenting with right upper quadrant pain.  Patient was hospitalized from 8/21 to 8/28 and found to have a subhepatic abscess, she currently has a PICC line in place in her right upper extremity and is on IV Rocephin  daily at home as well as p.o. Flagyl .  She tells me around 4 AM she began to experience right-sided numbness, from her upper extremity down, and that her sharp right upper quadrant pain began around the same time.  She also has a drain in place that exits her right upper quadrant, does not report any change in drain output.  She endorses lightheadedness at times, sometimes like the room is spinning , no syncope.  She denies fever, nausea/vomiting, diarrhea, chest pain, shortness of breath. On Xarelto .   Abdominal Pain      Prior to Admission medications   Medication Sig Start Date End Date Taking? Authorizing Provider  acetaminophen  (TYLENOL ) 325 MG tablet Take 2 tablets (650 mg total) by mouth every 6 (six) hours as needed for mild pain (or Fever >/= 101). 04/11/22   Elgergawy, Brayton RAMAN, MD  cefTRIAXone  (ROCEPHIN ) IVPB Inject 2 g into the vein daily for 27 days. Indication: Klebsiella Pneumoniae Intra-abdominal infection First Dose: Yes Last Day of Therapy: 09/13/24 Labs - Once weekly:  CBC/D and BMP, Labs - Once weekly: ESR and CRP Method of administration: IV Push Method of administration may be changed at the discretion of home infusion pharmacist based upon assessment of the patient and/or caregiver's ability to self-administer the medication ordered. 08/19/24 09/15/24  Waymond Cart, MD  cyanocobalamin  ([employer] VITAMIN B12) 1000 MCG tablet Take 1 tablet (1,000 mcg total) by mouth daily. 01/28/23   Brien Belvie BRAVO, MD  cyanocobalamin  1000 MCG  tablet Take 1 tablet (1,000 mcg total) by mouth daily. 08/19/24   Waymond Cart, MD  diclofenac  Sodium (VOLTAREN ) 1 % GEL Apply 4 g topically 4 (four) times daily. 08/19/24   Waymond Cart, MD  methocarbamol  (ROBAXIN ) 750 MG tablet Take 1 tablet (750 mg total) by mouth every 6 (six) hours as needed for muscle spasms. 08/19/24 09/18/24  Waymond Cart, MD  metroNIDAZOLE  (FLAGYL ) 500 MG tablet Take 1 tablet (500 mg total) by mouth 2 (two) times daily for 27 days. 08/19/24 09/15/24  Waymond Cart, MD  ondansetron  (ZOFRAN -ODT) 4 MG disintegrating tablet Take 1 tablet (4 mg total) by mouth every 8 (eight) hours as needed for nausea or vomiting. 08/19/24   Waymond Cart, MD  oxyCODONE  (OXY IR/ROXICODONE ) 5 MG immediate release tablet Take 1 tablet (5 mg total) by mouth every 4 (four) hours as needed for up to 7 days for severe pain (pain score 7-10) or moderate pain (pain score 4-6). 08/19/24 08/26/24  Waymond Cart, MD  rivaroxaban  (XARELTO ) 20 MG TABS tablet Take 1 tablet (20 mg total) by mouth daily with supper. 08/19/24   Waymond Cart, MD  senna-docusate (SENOKOT-S) 8.6-50 MG tablet Take 1 tablet by mouth at bedtime as needed for mild constipation. 08/19/24   Waymond Cart, MD  sodium chloride  flush 0.9 % SOLN injection Flush 5-10mL of saline into catheter daily to prevent clogging 08/17/24   McInnis, Caitlyn M, PA-C    Allergies: Adhesive [tape]    Review of Systems  Gastrointestinal:  Positive for abdominal  pain.    Updated Vital Signs  Vitals:   08/22/24 0615 08/22/24 0753 08/22/24 0800 08/22/24 0900  BP: (!) 114/57 (!) 113/57 107/60 111/88  Pulse: 92 95 94 94  Resp:  14 14   Temp:  97.9 F (36.6 C)    TempSrc:  Oral    SpO2: 91% 95% 95% 100%     Physical Exam Vitals and nursing note reviewed.  Constitutional:      Appearance: She is obese.  HENT:     Head: Normocephalic.  Eyes:     Extraocular Movements: Extraocular movements intact.     Pupils: Pupils are equal, round, and reactive to light.   Cardiovascular:     Rate and Rhythm: Normal rate.     Pulses:          Radial pulses are 2+ on the right side and 2+ on the left side.       Dorsalis pedis pulses are 2+ on the right side and 2+ on the left side.  Pulmonary:     Effort: Pulmonary effort is normal.  Abdominal:     Palpations: Abdomen is soft.     Tenderness: There is abdominal tenderness (generalized RUQ). There is no guarding.     Comments: Drain in place exiting RUQ with translucent brown output, no obvious blood. Site where drain exits RUQ is without surrounding erythema/warmth  Musculoskeletal:     Cervical back: Normal range of motion.     Comments: 5/5 strength against resistance of bilateral upper and lower extremities  Skin:    General: Skin is warm and dry.     Comments: RUE PICC line in place with some bruising around the site, no obvious erythema/warmth  Neurological:     Mental Status: She is alert and oriented to person, place, and time.     Comments: No appreciable sensory deficits of bilateral upper extremities Diminished sensation to right lower extremity, does not follow specific dermatomal distribution Facial expressions are symmetric and intact without evidence of facial droop     (all labs ordered are listed, but only abnormal results are displayed) Labs Reviewed  COMPREHENSIVE METABOLIC PANEL WITH GFR - Abnormal; Notable for the following components:      Result Value   Glucose, Bld 171 (*)    Calcium 8.8 (*)    Albumin 1.9 (*)    All other components within normal limits  CBC - Abnormal; Notable for the following components:   RBC 3.79 (*)    Hemoglobin 9.2 (*)    HCT 31.9 (*)    MCH 24.3 (*)    MCHC 28.8 (*)    RDW 17.2 (*)    Platelets 608 (*)    All other components within normal limits  MAGNESIUM  - Abnormal; Notable for the following components:   Magnesium  1.5 (*)    All other components within normal limits  LIPASE, BLOOD    EKG: None  Radiology: CT ABDOMEN PELVIS W  CONTRAST Result Date: 08/22/2024 EXAM: CT ABDOMEN AND PELVIS WITH CONTRAST 08/22/2024 10:21:43 AM TECHNIQUE: CT of the abdomen and pelvis was performed with the administration of intravenous contrast. Multiplanar reformatted images are provided for review. Automated exposure control, iterative reconstruction, and/or weight-based adjustment of the mA/kV was utilized to reduce the radiation dose to as low as reasonably achievable. COMPARISON: None available. CLINICAL HISTORY: RUQ pain, subhepatic abscess with drain placed by IR. PT BIB EMS FROM HOME WITH C/O ABD PAIN X2 HOURS. PT HAD A PROCEDURE 08/21 AND  ENDORSES PAIN AT THE SITE FINDINGS: LOWER CHEST: Visualized lung bases are clear. LIVER: Gas and minimal fluid remain within portions of the collection that are now inferior and anterior to the liver. Details are somewhat obscured due to beam hardening artifact. GALLBLADDER AND BILE DUCTS: Cholecystectomy. SPLEEN: Normal size. No focal lesion. PANCREAS: No mass. No ductal dilatation. ADRENAL GLANDS: Normal appearance. No mass. KIDNEYS, URETERS AND BLADDER: No stones in the kidneys or ureters. No hydronephrosis. No perinephric or periureteral stranding. Urinary bladder is unremarkable. GI AND BOWEL: Stomach demonstrates no acute abnormality. There is no bowel obstruction. No bowel wall thickening. PERITONEUM AND RETROPERITONEUM: Percutaneous drain is present in the most inferior aspect of the collection. The collection is mostly collapsed. VASCULATURE: Aorta is normal in caliber. LYMPH NODES: No lymphadenopathy. REPRODUCTIVE ORGANS: No significant abnormality. BONES AND SOFT TISSUES: Calcifications are present at the mitral valve. No acute osseous abnormality. No focal soft tissue abnormality. Paraumbilical hernia contains fat without bowel. IMPRESSION: 1. Subhepatic abscess with percutaneous drain in place. The collection is mostly collapsed with residual gas and minimal fluid, details limited by beam hardening  artifact. 2. Paraumbilical hernia containing fat without bowel. Electronically signed by: Lonni Necessary MD 08/22/2024 10:36 AM EDT RP Workstation: [id number]   US  Abdomen Limited RUQ (LIVER/GB) Result Date: 08/22/2024 EXAM: Right Upper Quadrant Abdominal Ultrasound 08/22/2024 07:23:01 AM TECHNIQUE: Real-time ultrasonography of the right upper quadrant of the abdomen was performed. COMPARISON: None available. CLINICAL HISTORY: RUQ pain FINDINGS: LIVER: Increased parenchymal echogenicity of the liver compatible with steatosis. No intrahepatic biliary ductal dilatation. The portal vein is patent with normal direction towards the liver. BILIARY SYSTEM: Status post cholecystectomy. Common bile duct measures 4.9 mm. OTHER: In the region of the right lower quadrant drainage catheter there is a complex fluid collection noted corresponding with the known hepatic abscess. This is suboptimally evaluated on the current exam. IMPRESSION: 1. Known hepatic abscess in the region of the right lower quadrant drainage catheter, suboptimally evaluated. 2. Status post cholecystectomy. 3. Increased parenchymal echogenicity of the liver compatible with steatosis. Electronically signed by: Waddell Calk MD 08/22/2024 07:28 AM EDT RP Workstation: [id number]     Procedures   Medications Ordered in the ED  magnesium  oxide (MAG-OX) tablet 400 mg (400 mg Oral Given 08/22/24 0757)  acetaminophen  (TYLENOL ) tablet 1,000 mg (1,000 mg Oral Given 08/22/24 0923)  iohexol  (OMNIPAQUE ) 350 MG/ML injection 100 mL (100 mLs Intravenous Contrast Given 08/22/24 1003)                                    Medical Decision Making This patient presents to the ED for concern of right upper quadrant pain, this involves an extensive number of treatment options, and is a complaint that carries with it a high risk of complications and morbidity.  The differential diagnosis includes worsening subhepatic abscess, antibiotic failure, postoperative  complication, drain malfunction, other postoperative pain   Co morbidities that complicate the patient evaluation  Obesity, recent admission for subhepatic abscess, on Xarelto    Additional history obtained:  Additional history obtained from record review External records from outside source obtained and reviewed including recent hospital discharge summary   Lab Tests:  I Ordered, and personally interpreted labs.  The pertinent results include: CBC notable for improvement in white blood cell count down from 11.8-10.2 today, hemoglobin trending up from 8.4-9.2 today, thrombocytosis with platelet count of 608 which is up from 543.  CMP  largely stable from previous, with improvement in calcium/albumin.  Lipase 42.  Magnesium  1.5.   Imaging Studies ordered:  I ordered imaging studies including RUQ Ultrasound  I independently visualized and interpreted imaging which showed 1. Known hepatic abscess in the region of the right lower quadrant drainage catheter, suboptimally evaluated. 2. Status post cholecystectomy. 3. Increased parenchymal echogenicity of the liver compatible with steatosis.  I agree with the radiologist interpretation   Cardiac Monitoring: / EKG:  The patient was maintained on a cardiac monitor.  I personally viewed and interpreted the cardiac monitored which showed an underlying rhythm of: NSR   Consultations Obtained:  I requested consultation with the hospital medicine service,  and discussed lab and imaging findings as well as pertinent plan - they recommend: I spoke with Dr. Nelia who agrees to come and evaluate this patient at the bedside, as she is well-known to their service as they cared for her during her recent hospitalization. Dr. Nelia messaged me via secure chat and requested that I consult IR to have them review her US  images, if they feel her abscess is worsening/any acute intervention is to be made she may be appropriate for admission.  I spoke  with Dr. Hughes, he advised that US  is likely not sufficient enough to evaluate any worsening/progression of her abscess, recommends CT. I discussed findings of CT results with Dr. Rochester. [place], Dr. Hughes states: Re the collection and drain, the collection looks rather good as compared to the recent comparisons. No VIR intervention recommended, she should have routine ambulatory drain clinic followup.    Problem List / ED Course / Critical interventions / Medication management  I ordered medication including magnesium   for hypomagnesemia   I have reviewed the patients home medicines and have made adjustments as needed   Social Determinants of Health:  Insufficient physical activity   Test / Admission - Considered:  Physical exam is notable as above.  Patient reports right sided numbness, there is no appreciable deficit on exam however she does report that her right lower extremity feels weird as compared to the left, this has no specific dermatomal distribution.  Otherwise, neurologic exam is within normal limits.  She does have some generalized tenderness to palpation in her right upper quadrant, her drain remains in place and does not have any obvious signs of surrounding infection, drain output is translucent/brown in appearance without frank blood.  Ultrasound results as above. See above for detailed discussion/recommendations from the internal medicine service and interventional radiology service. CT results are reassuring, she is afebrile and without leukocytosis. I discussed these results in depth with the patient, she tells me that she does have an appointment scheduled with the drain clinic/IR as directed above. Strict return precautions discussed, she is appropriate for discharge at this time.      Amount and/or Complexity of Data Reviewed Labs: ordered. Radiology: ordered.  Risk OTC drugs. Prescription drug management.        Final diagnoses:  Right upper  quadrant abdominal pain    ED Discharge Orders     None          Glendia Rocky LOISE DEVONNA 08/22/24 1122    Bernard Drivers, MD 08/22/24 1135

## 2024-08-22 NOTE — ED Notes (Signed)
 Patient transported to Ultrasound

## 2024-08-22 NOTE — Consult Note (Signed)
 Date: 08/22/2024               Patient Name:  Susan Warren MRN: [id number]  DOB: 12-Jun-1976 Age / Sex: 48 y.o., female   PCP: Brien Belvie BRAVO, MD         Requesting Physician: Dr. Bernard Drivers, MD    Consulting Reason:  Abdominal Pain     Chief Complaint: Abdominal pain  History of Present Illness:   Susan Warren is a 48 year old female with past medical history of morbid obesity, and recurrent hepatic abscesses who was recently discharged from the internal medicine teaching service on August 28 after a drain was placed for these recurrent hepatic abscesses.  She was discharged with IV ceftriaxone  via PICC line as well as Flagyl  p.o.  This morning, she woke up around 4 AM due to abdominal pain around the drain site. She denies any systemic symptoms such as fevers, chills, chest pain, shortness of breath. Drain site also looks non-erythematous, and she hasn't noticed any increase in output from the drain.   Meds: No current facility-administered medications for this encounter.   Current Outpatient Medications  Medication Sig Dispense Refill   acetaminophen  (TYLENOL ) 325 MG tablet Take 2 tablets (650 mg total) by mouth every 6 (six) hours as needed for mild pain (or Fever >/= 101).     cefTRIAXone  (ROCEPHIN ) IVPB Inject 2 g into the vein daily for 27 days. Indication: Klebsiella Pneumoniae Intra-abdominal infection First Dose: Yes Last Day of Therapy: 09/13/24 Labs - Once weekly:  CBC/D and BMP, Labs - Once weekly: ESR and CRP Method of administration: IV Push Method of administration may be changed at the discretion of home infusion pharmacist based upon assessment of the patient and/or caregiver's ability to self-administer the medication ordered. 27 Units 0   cyanocobalamin  (CVS VITAMIN B12) 1000 MCG tablet Take 1 tablet (1,000 mcg total) by mouth daily. 90 tablet 0   cyanocobalamin  1000 MCG tablet Take 1 tablet (1,000 mcg total) by mouth daily. 30 tablet 0   diclofenac   Sodium (VOLTAREN ) 1 % GEL Apply 4 g topically 4 (four) times daily. 120 g 0   methocarbamol  (ROBAXIN ) 750 MG tablet Take 1 tablet (750 mg total) by mouth every 6 (six) hours as needed for muscle spasms. 60 tablet 0   metroNIDAZOLE  (FLAGYL ) 500 MG tablet Take 1 tablet (500 mg total) by mouth 2 (two) times daily for 27 days. 54 tablet 0   ondansetron  (ZOFRAN -ODT) 4 MG disintegrating tablet Take 1 tablet (4 mg total) by mouth every 8 (eight) hours as needed for nausea or vomiting. 20 tablet 0   oxyCODONE  (OXY IR/ROXICODONE ) 5 MG immediate release tablet Take 1 tablet (5 mg total) by mouth every 4 (four) hours as needed for up to 7 days for severe pain (pain score 7-10) or moderate pain (pain score 4-6). 30 tablet 0   rivaroxaban  (XARELTO ) 20 MG TABS tablet Take 1 tablet (20 mg total) by mouth daily with supper. 30 tablet 0   senna-docusate (SENOKOT-S) 8.6-50 MG tablet Take 1 tablet by mouth at bedtime as needed for mild constipation.     sodium chloride  flush 0.9 % SOLN injection Flush 5-10mL of saline into catheter daily to prevent clogging 300 mL 2    Allergies: Allergies as of 08/22/2024 - Review Complete 08/22/2024  Allergen Reaction Noted   Adhesive [tape] Anaphylaxis and Swelling 03/19/2016   Past Medical History:  Diagnosis Date   Abdominal pain due to right abdominal and  pelvic cystic mass 04/16/2022   Allergy    Anxiety    Candidemia (HCC) 04/18/2022   Depression    DVT (deep venous thrombosis) (HCC)    GERD (gastroesophageal reflux disease)    Hepatic cyst    Hypertension    Lymphedema of both lower extremities    Migraines    Morbid obesity with BMI of 70 and over, adult [facility])    PE (pulmonary thromboembolism) (HCC) 03/2019   Pneumonia 04/09/2022   Sleep apnea    Past Surgical History:  Procedure Laterality Date   CHOLECYSTECTOMY     IR INFUSION THROMBOL ARTERIAL INITIAL (MS)  03/29/2019   IR INFUSION THROMBOL ARTERIAL INITIAL (MS)  03/29/2019   IR PATIENT EVAL TECH 0-60 MINS   08/16/2024   IR THROMB F/U EVAL ART/VEN FINAL DAY (MS)  03/30/2019   IR US  GUIDE BX ASP/DRAIN  08/12/2024   IR US  GUIDE VASC ACCESS RIGHT  03/29/2019   TEE WITHOUT CARDIOVERSION N/A 04/23/2022   Procedure: TRANSESOPHAGEAL ECHOCARDIOGRAM (TEE);  Surgeon: Francyne Headland, MD;  Location: [facility] ENDOSCOPY;  Service: Cardiovascular;  Laterality: N/A;   Family History  Problem Relation Age of Onset   Hypertension Mother    Thyroid disease Mother    Hypertension Maternal Grandmother    Diabetes Maternal Grandmother    Breast cancer Maternal Grandmother    Stroke Maternal Grandmother    Heart attack Neg Hx    Social History   Socioeconomic History   Marital status: Widowed    Spouse name: Not on file   Number of children: Not on file   Years of education: 12   Highest education level: Associate degree: occupational, Scientist, product/process development, or vocational program  Occupational History   Occupation: Admin.    Employer: [employer]  Tobacco Use   Smoking status: Never   Smokeless tobacco: Never  Vaping Use   Vaping status: Never Used  Substance and Sexual Activity   Alcohol use: Yes    Comment: occ   Drug use: No   Sexual activity: Not Currently    Birth control/protection: I.U.D.  Other Topics Concern   Not on file  Social History Narrative   Married, 1 child   Social Drivers of Corporate investment banker Strain: Low Risk  (12/11/2023)   Overall Financial Resource Strain (CARDIA)    Difficulty of Paying Living Expenses: Not hard at all  Food Insecurity: Food Insecurity Present (12/11/2023)   Hunger Vital Sign    Worried About Running Out of Food in the Last Year: Never true    Ran Out of Food in the Last Year: Sometimes true  Transportation Needs: No Transportation Needs (12/11/2023)   PRAPARE - Administrator, Civil Service (Medical): No    Lack of Transportation (Non-Medical): No  Physical Activity: Insufficiently Active (12/11/2023)   Exercise Vital Sign    Days of  Exercise per Week: 2 days    Minutes of Exercise per Session: 10 min  Stress: No Stress Concern Present (12/11/2023)   [employer] of Occupational Health - Occupational Stress Questionnaire    Feeling of Stress : Only a little  Social Connections: Unknown (12/11/2023)   Social Connection and Isolation Panel    Frequency of Communication with Friends and Family: More than three times a week    Frequency of Social Gatherings with Friends and Family: Once a week    Attends Religious Services: Patient declined    Database administrator or Organizations: No  Attends Banker Meetings: Not on file    Marital Status: Widowed  Intimate Partner Violence: Not on file    Review of Systems: Pertinent items are noted in HPI.  Physical Exam: Blood pressure 118/63, pulse 90, temperature 98.1 F (36.7 C), temperature source Oral, resp. rate 16, SpO2 96%.  Constitutional: Obese female, in no acute distress  Abdominal: Drain in place on RLQ, non erythematous, draining well. Mildly tender to palpation in that area. Normoactive bowel sounds.  Cardiovascular: Regular rate and rhythm  Pulmonary: Lungs clear to auscultation bilaterally  Lab results:    Latest Ref Rng & Units 08/22/2024    6:23 AM 08/19/2024    6:10 AM 08/18/2024    3:02 AM  BMP  Glucose 70 - 99 mg/dL 828  792  816   BUN 6 - 20 mg/dL 14  9  8    Creatinine 0.44 - 1.00 mg/dL 8.99  9.26  9.35   Sodium 135 - 145 mmol/L 135  133  132   Potassium 3.5 - 5.1 mmol/L 3.9  4.4  4.6   Chloride 98 - 111 mmol/L 98  96  96   CO2 22 - 32 mmol/L 24  26  26    Calcium 8.9 - 10.3 mg/dL 8.8  8.3  8.1         Latest Ref Rng & Units 08/22/2024    6:23 AM 08/19/2024    6:10 AM 08/18/2024    3:02 AM  CBC  WBC 4.0 - 10.5 K/uL 10.2  11.8  12.9   Hemoglobin 12.0 - 15.0 g/dL 9.2  8.4  8.3   Hematocrit 36.0 - 46.0 % 31.9  28.1  28.2   Platelets 150 - 400 K/uL 608  543  507      Imaging results:  CT ABDOMEN PELVIS W CONTRAST Result  Date: 08/22/2024 EXAM: CT ABDOMEN AND PELVIS WITH CONTRAST 08/22/2024 10:21:43 AM TECHNIQUE: CT of the abdomen and pelvis was performed with the administration of intravenous contrast. Multiplanar reformatted images are provided for review. Automated exposure control, iterative reconstruction, and/or weight-based adjustment of the mA/kV was utilized to reduce the radiation dose to as low as reasonably achievable. COMPARISON: None available. CLINICAL HISTORY: RUQ pain, subhepatic abscess with drain placed by IR. PT BIB EMS FROM HOME WITH C/O ABD PAIN X2 HOURS. PT HAD A PROCEDURE 08/21 AND ENDORSES PAIN AT THE SITE FINDINGS: LOWER CHEST: Visualized lung bases are clear. LIVER: Gas and minimal fluid remain within portions of the collection that are now inferior and anterior to the liver. Details are somewhat obscured due to beam hardening artifact. GALLBLADDER AND BILE DUCTS: Cholecystectomy. SPLEEN: Normal size. No focal lesion. PANCREAS: No mass. No ductal dilatation. ADRENAL GLANDS: Normal appearance. No mass. KIDNEYS, URETERS AND BLADDER: No stones in the kidneys or ureters. No hydronephrosis. No perinephric or periureteral stranding. Urinary bladder is unremarkable. GI AND BOWEL: Stomach demonstrates no acute abnormality. There is no bowel obstruction. No bowel wall thickening. PERITONEUM AND RETROPERITONEUM: Percutaneous drain is present in the most inferior aspect of the collection. The collection is mostly collapsed. VASCULATURE: Aorta is normal in caliber. LYMPH NODES: No lymphadenopathy. REPRODUCTIVE ORGANS: No significant abnormality. BONES AND SOFT TISSUES: Calcifications are present at the mitral valve. No acute osseous abnormality. No focal soft tissue abnormality. Paraumbilical hernia contains fat without bowel. IMPRESSION: 1. Subhepatic abscess with percutaneous drain in place. The collection is mostly collapsed with residual gas and minimal fluid, details limited by beam hardening artifact. 2.  Paraumbilical  hernia containing fat without bowel. Electronically signed by: Lonni Necessary MD 08/22/2024 10:36 AM EDT RP Workstation: [id number]   US  Abdomen Limited RUQ (LIVER/GB) Result Date: 08/22/2024 EXAM: Right Upper Quadrant Abdominal Ultrasound 08/22/2024 07:23:01 AM TECHNIQUE: Real-time ultrasonography of the right upper quadrant of the abdomen was performed. COMPARISON: None available. CLINICAL HISTORY: RUQ pain FINDINGS: LIVER: Increased parenchymal echogenicity of the liver compatible with steatosis. No intrahepatic biliary ductal dilatation. The portal vein is patent with normal direction towards the liver. BILIARY SYSTEM: Status post cholecystectomy. Common bile duct measures 4.9 mm. OTHER: In the region of the right lower quadrant drainage catheter there is a complex fluid collection noted corresponding with the known hepatic abscess. This is suboptimally evaluated on the current exam. IMPRESSION: 1. Known hepatic abscess in the region of the right lower quadrant drainage catheter, suboptimally evaluated. 2. Status post cholecystectomy. 3. Increased parenchymal echogenicity of the liver compatible with steatosis. Electronically signed by: Waddell Calk MD 08/22/2024 07:28 AM EDT RP Workstation: [id number]    Assessment, Plan, & Recommendations by Problem:  #Abdominal Pain  #Recent Drain Placement for Hepatic Abscesses Patient recently discharged from the internal medicine teaching service with drain in place, complaining of 3-hour history of worsening abdominal pain that woke her up this morning.  She is not having any systemic symptoms, and her white blood cell count is within normal limits.  She is taking her antibiotics as prescribed.  She was given a dose of Tylenol  in the emergency department, she states that her pain is better.  She also has oxycodone  at home which she has been taking sparingly.  CT abdomen shows subhepatic abscess with percutaneous drain in place and collection  is mostly collapsed with residual gas and minimal fluid, which is reassuring.  Discussed with interventional radiology, they had no acute interventions, and recommended outpatient follow-up.  I agree with this plan, reemphasized the importance of continuing to take her antibiotics and pain control with Tylenol  and her oxycodone .  Also encouraged outpatient follow-up with her PCP.    Signed: Michalina Calbert, MD 08/22/2024, 11:33 AM

## 2024-08-24 ENCOUNTER — Other Ambulatory Visit: Payer: Self-pay | Admitting: Critical Care Medicine

## 2024-08-24 ENCOUNTER — Other Ambulatory Visit: Payer: Self-pay | Admitting: Internal Medicine

## 2024-08-24 ENCOUNTER — Telehealth: Payer: Self-pay

## 2024-08-24 DIAGNOSIS — K7689 Other specified diseases of liver: Secondary | ICD-10-CM

## 2024-08-24 NOTE — Telephone Encounter (Unsigned)
 Copied from CRM #[id number]. Topic: Clinical - Medication Refill >> Aug 24, 2024 11:36 AM Corin V wrote: Medication: rivaroxaban  (XARELTO ) 20 MG TABS tablet- hospitalist stopped Warfarin and switched her to Xarelto . They advised PCP will need to handle refills   Has the patient contacted their pharmacy? Yes (Agent: If no, request that the patient contact the pharmacy for the refill. If patient does not wish to contact the pharmacy document the reason why and proceed with request.) (Agent: If yes, when and what did the pharmacy advise?)  This is the patient's preferred pharmacy:  [id number] [place], [place] - [address] [place] [address] Phone: [phone] Fax: [phone]  Is this the correct pharmacy for this prescription? No If no, delete pharmacy and type the correct one.   Has the prescription been filled recently? No  Is the patient out of the medication? No  Has the patient been seen for an appointment in the last year OR does the patient have an upcoming appointment? Yes  Can we respond through MyChart? No  Agent: Please be advised that Rx refills may take up to 3 business days. We ask that you follow-up with your pharmacy.

## 2024-08-24 NOTE — Transitions of Care (Post Inpatient/ED Visit) (Signed)
 08/24/2024  Name: Susan Warren MRN: [id number] DOB: Aug 12, 1976  Today's TOC FU Call Status: Today's TOC FU Call Status:: Successful TOC FU Call Completed TOC FU Call Complete Date: 08/24/24 Patient's Name and Date of Birth confirmed.  Transition Care Management Follow-up Telephone Call Date of Discharge: 08/19/24 Discharge Facility: [facility]) Type of Discharge: Inpatient Admission Primary Inpatient Discharge Diagnosis:: hepatic abscess.   returned to ED 08/22/2024 with RUQ pain. How have you been since you were released from the hospital?: Better Any questions or concerns?: No  Items Reviewed: Did you receive and understand the discharge instructions provided?: Yes Medications obtained,verified, and reconciled?: Yes (Medications Reviewed) (She has all medications and she did not have any questions about the med regime.  She noted that the cyanocobalamin  is listed twice on her AVS) Any new allergies since your discharge?: No Dietary orders reviewed?: Yes Type of Diet Ordered:: heart healthy, low sodium Do you have support at home?: Yes People in Home [RPT]: child(ren), adult Name of Support/Comfort Primary Source: her son  Medications Reviewed Today: Medications Reviewed Today     Reviewed by Marvis Bradley, RN (Case Manager) on 08/24/24 at 1511  Med List Status: <None>   Medication Order Taking? Sig Documenting Provider Last Dose Status Informant  acetaminophen  (TYLENOL ) 325 MG tablet [id number]  Take 2 tablets (650 mg total) by mouth every 6 (six) hours as needed for mild pain (or Fever >/= 101). Elgergawy, Brayton RAMAN, MD  Active Self, Pharmacy Records  cefTRIAXone  (ROCEPHIN ) IVPB [id number]  Inject 2 g into the vein daily for 27 days. Indication: Klebsiella Pneumoniae Intra-abdominal infection First Dose: Yes Last Day of Therapy: 09/13/24 Labs - Once weekly:  CBC/D and BMP, Labs - Once weekly: ESR and CRP Method of administration: IV Push Method of administration may  be changed at the discretion of home infusion pharmacist based upon assessment of the patient and/or caregiver's ability to self-administer the medication ordered. Waymond Cart, MD  Active   cyanocobalamin  ([facility]S VITAMIN B12) 1000 MCG tablet [id number]  Take 1 tablet (1,000 mcg total) by mouth daily. Brien Belvie BRAVO, MD  Active Self, Pharmacy Records  cyanocobalamin  1000 MCG tablet [id number]  Take 1 tablet (1,000 mcg total) by mouth daily. Waymond Cart, MD  Active   diclofenac  Sodium (VOLTAREN ) 1 % GEL [id number]  Apply 4 g topically 4 (four) times daily. Waymond Cart, MD  Active   methocarbamol  (ROBAXIN ) 750 MG tablet [id number]  Take 1 tablet (750 mg total) by mouth every 6 (six) hours as needed for muscle spasms. Waymond Cart, MD  Active   metroNIDAZOLE  (FLAGYL ) 500 MG tablet [id number]  Take 1 tablet (500 mg total) by mouth 2 (two) times daily for 27 days. Waymond Cart, MD  Active   ondansetron  (ZOFRAN -ODT) 4 MG disintegrating tablet [id number]  Take 1 tablet (4 mg total) by mouth every 8 (eight) hours as needed for nausea or vomiting. Waymond Cart, MD  Active   oxyCODONE  (OXY IR/ROXICODONE ) 5 MG immediate release tablet [id number]  Take 1 tablet (5 mg total) by mouth every 4 (four) hours as needed for up to 7 days for severe pain (pain score 7-10) or moderate pain (pain score 4-6). Waymond Cart, MD  Active   rivaroxaban  (XARELTO ) 20 MG TABS tablet [id number]  Take 1 tablet (20 mg total) by mouth daily with supper. Waymond Cart, MD  Active   senna-docusate (SENOKOT-S) 8.6-50 MG tablet [id number]  Take 1 tablet by mouth at bedtime as needed for mild constipation. Tan,  Letha, MD  Active   sodium chloride  flush 0.9 % SOLN injection [id number]  Flush 5-10mL of saline into catheter daily to prevent clogging Spencer Glennon HERO, PA-C  Active             Home Care and Equipment/Supplies: Were Home Health Services Ordered?: Yes Name of Home Health Agency:: [facility]home infusion. Has Agency set up a time to come to  your home?: No EMR reviewed for Home Health Orders: Orders present/patient has not received call (refer to CM for follow-up) (The patient said that her appointment with [facility] was cancelled because she had the PICC dressing changed in the ED.  She plans to [facility] tomorrow to confirm when they will be seeing her. the PICC dressing and labs are to be done weekly.) Any new equipment or medical supplies ordered?: Yes Name of Medical supply agency?: [facility]- IV medication and administration supplies Were you able to get the equipment/medical supplies?: Yes Do you have any questions related to the use of the equipment/supplies?: No  Functional Questionnaire: Do you need assistance with bathing/showering or dressing?: No (She stated she has been caring for her hepatic drain indpendently and has been empying the drainage and keeping a record of the output.) Do you need assistance with meal preparation?: No Do you need assistance with eating?: No Do you have difficulty maintaining continence: No Do you need assistance with getting out of bed/getting out of a chair/moving?: No Do you have difficulty managing or taking your medications?: No (She said she has been self administering her IV antibiotic without any difficulty.)  Follow up appointments reviewed: PCP Follow-up appointment confirmed?: Yes Date of PCP follow-up appointment?: 09/01/24 Follow-up Provider: Dr Tanda.    Dr Brien, who is listed as PCP has retired and patient would like to estab care at [facility] Follow-up appointment confirmed?: Yes Date of Specialist follow-up appointment?: 08/31/24 Follow-Up Specialty Provider:: OB/GYB.   [facility]- 09/06/2024.   She understands that because she is not on coumadin  she does not need to follow up with Herlene Fleeta Morris, RPH/ Coumadin  clinic Do you need transportation to your follow-up appointment?: No Do you understand care options if your condition(s) worsen?: Yes-patient  verbalized understanding    SIGNATURE Slater Diesel, RN

## 2024-08-25 NOTE — Telephone Encounter (Signed)
 Too soon for refill, LRF 08/19/24 for30 days.  Requested Prescriptions  Pending Prescriptions Disp Refills   rivaroxaban  (XARELTO ) 20 MG TABS tablet 30 tablet 0    Sig: Take 1 tablet (20 mg total) by mouth daily with supper.     Hematology: Anticoagulants - rivaroxaban  Failed - 08/25/2024  8:36 AM      Failed - HCT in normal range and within 360 days    HCT  Date Value Ref Range Status  08/22/2024 31.9 (L) 36.0 - 46.0 % Final   Hematocrit  Date Value Ref Range Status  05/06/2022 36.1 34.0 - 46.6 % Final         Failed - HGB in normal range and within 360 days    Hemoglobin  Date Value Ref Range Status  08/22/2024 9.2 (L) 12.0 - 15.0 g/dL Final  94/84/7976 88.3 11.1 - 15.9 g/dL Final         Failed - PLT in normal range and within 360 days    Platelets  Date Value Ref Range Status  08/22/2024 608 (H) 150 - 400 K/uL Final  05/06/2022 609 (H) 150 - 450 x10E3/uL Final         Failed - Valid encounter within last 12 months    Recent Outpatient Visits           2 years ago Leg DVT (deep venous thromboembolism), chronic, left (HCC)   [facility] [place]. [facility] Brien Belvie BRAVO, MD   2 years ago Candidemia [facility])   [facility] Of [place]. [facility] Brien Belvie BRAVO, MD   5 years ago PE (pulmonary thromboembolism) [facility])   [facility] Dept Of [place]. [facility] Brien Belvie BRAVO, MD   5 years ago Acute deep vein thrombosis (DVT) of femoral vein of right lower extremity [facility])   [facility] - [facility] Dept Of [place]. [facility] Brien Belvie BRAVO, MD   5 years ago PE (pulmonary thromboembolism) [facility])   [facility] - [facility] Dept Of [place]. [facility] Brien Belvie BRAVO, MD       Future Appointments             In 1 week Luiz Channel, MD [facility] - A Dept Of [facility]. [facility], [facility]            Passed - ALT in normal range and within 360 days    ALT  Date Value Ref Range Status  08/22/2024 14 0 - 44 U/L Final         Passed - AST in normal range and within 360 days    AST  Date Value Ref Range Status  08/22/2024 17 15 - 41 U/L Final         Passed - Cr in normal range and within 360 days    Creat  Date Value Ref Range Status  05/09/2016 0.74 0.50 - 1.10 mg/dL Final   Creatinine, Ser  Date Value Ref Range Status  08/22/2024 1.00 0.44 - 1.00 mg/dL Final         Passed - eGFR is 15 or above and within 360 days    GFR calc Af [facility]  Date Value Ref Range Status  04/15/2019 115 >59 mL/min/1.73 Final   GFR, Estimated  Date  Value Ref Range Status  08/22/2024 >60 >60 mL/min Final    Comment:    (NOTE) Calculated using the CKD-EPI Creatinine Equation (2021)    GFR  Date Value Ref Range Status  01/25/2014 84.43 >60.00 mL/min Final   eGFR  Date Value Ref Range Status  05/06/2022 88 >59 mL/min/1.73 Final         Passed - Patient is not pregnant

## 2024-08-30 NOTE — Progress Notes (Deleted)
 GYNECOLOGY OFFICE VISIT NOTE  History:  Susan Warren is a 48 y.o. G1P1001 here today for PMB with known IUD in place. She has a paragard IUD in place. It was previously attempted to be removed but unsuccessfully.  This last attempt was in 2014.   She had an US  while she was inpatient during her hospitalization (when the PMB was diagnosed) which showed: Posterior 4 cm intramural fibroid EL 14 mm  No IUD mentioned  She has had multiple CT imaging of her pelvis and no visualization of the IUD. However it was mentioned in 2020 in the CT venogram.   I reviewed the images of all of these studies from 2020 onward. ONLY on the CT venogram is it apparent that there is a IUD and it is extremely obvious (since it is a copper IUD).     She has a history of VTE.   She is on Xarelto .   She denies any abnormal vaginal discharge, bleeding, pelvic pain or other concerns.    Past Medical History:  Diagnosis Date   Abdominal pain due to right abdominal and pelvic cystic mass 04/16/2022   Allergy    Anxiety    Candidemia (HCC) 04/18/2022   Depression    DVT (deep venous thrombosis) (HCC)    GERD (gastroesophageal reflux disease)    Hepatic cyst    Hypertension    Lymphedema of both lower extremities    Migraines    Morbid obesity with BMI of 70 and over, adult [facility])    PE (pulmonary thromboembolism) (HCC) 03/2019   Pneumonia 04/09/2022   Sleep apnea     Past Surgical History:  Procedure Laterality Date   CHOLECYSTECTOMY     IR INFUSION THROMBOL ARTERIAL INITIAL (MS)  03/29/2019   IR INFUSION THROMBOL ARTERIAL INITIAL (MS)  03/29/2019   IR PATIENT EVAL TECH 0-60 MINS  08/16/2024   IR THROMB F/U EVAL ART/VEN FINAL DAY (MS)  03/30/2019   IR US  GUIDE BX ASP/DRAIN  08/12/2024   IR US  GUIDE VASC ACCESS RIGHT  03/29/2019   TEE WITHOUT CARDIOVERSION N/A 04/23/2022   Procedure: TRANSESOPHAGEAL ECHOCARDIOGRAM (TEE);  Surgeon: Francyne Headland, MD;  Location: [facility] ENDOSCOPY;  Service: Cardiovascular;   Laterality: N/A;    The following portions of the patient's history were reviewed and updated as appropriate: allergies, current medications, past family history, past medical history, past social history, past surgical history and problem list.   Health Maintenance:   Normal pap and negative HRHPV on ***.   No results found for: DIAGPAP   Normal mammogram on ***.   Review of Systems:  Pertinent items noted in HPI and remainder of comprehensive ROS otherwise negative.  Physical Exam:  There were no vitals taken for this visit. CONSTITUTIONAL: Well-developed, well-nourished female in no acute distress.  HEENT:  Normocephalic, atraumatic. External right and left ear normal. No scleral icterus.  NECK: Normal range of motion, supple, no masses noted on observation SKIN: No rash noted. Not diaphoretic. No erythema. No pallor. MUSCULOSKELETAL: Normal range of motion. No edema noted. NEUROLOGIC: Alert and oriented to person, place, and time. Normal muscle tone coordination. No cranial nerve deficit noted. PSYCHIATRIC: Normal mood and affect. Normal behavior. Normal judgment and thought content.  CARDIOVASCULAR: Normal heart rate noted RESPIRATORY: Effort and breath sounds normal, no problems with respiration noted ABDOMEN: No masses noted. No other overt distention noted.    PELVIC: {Blank single:19197::Deferred,Normal appearing external genitalia; normal urethral meatus; normal appearing vaginal mucosa and cervix.  No abnormal discharge noted.  Normal uterine size, no other palpable masses, no uterine or adnexal tenderness. Performed in the presence of a chaperone}  Labs and Imaging No results found for this or any previous visit (from the past week). CT ABDOMEN PELVIS W CONTRAST Result Date: 08/22/2024 EXAM: CT ABDOMEN AND PELVIS WITH CONTRAST 08/22/2024 10:21:43 AM TECHNIQUE: CT of the abdomen and pelvis was performed with the administration of intravenous contrast. Multiplanar  reformatted images are provided for review. Automated exposure control, iterative reconstruction, and/or weight-based adjustment of the mA/kV was utilized to reduce the radiation dose to as low as reasonably achievable. COMPARISON: None available. CLINICAL HISTORY: RUQ pain, subhepatic abscess with drain placed by IR. PT BIB EMS FROM HOME WITH C/O ABD PAIN X2 HOURS. PT HAD A PROCEDURE 08/21 AND ENDORSES PAIN AT THE SITE FINDINGS: LOWER CHEST: Visualized lung bases are clear. LIVER: Gas and minimal fluid remain within portions of the collection that are now inferior and anterior to the liver. Details are somewhat obscured due to beam hardening artifact. GALLBLADDER AND BILE DUCTS: Cholecystectomy. SPLEEN: Normal size. No focal lesion. PANCREAS: No mass. No ductal dilatation. ADRENAL GLANDS: Normal appearance. No mass. KIDNEYS, URETERS AND BLADDER: No stones in the kidneys or ureters. No hydronephrosis. No perinephric or periureteral stranding. Urinary bladder is unremarkable. GI AND BOWEL: Stomach demonstrates no acute abnormality. There is no bowel obstruction. No bowel wall thickening. PERITONEUM AND RETROPERITONEUM: Percutaneous drain is present in the most inferior aspect of the collection. The collection is mostly collapsed. VASCULATURE: Aorta is normal in caliber. LYMPH NODES: No lymphadenopathy. REPRODUCTIVE ORGANS: No significant abnormality. BONES AND SOFT TISSUES: Calcifications are present at the mitral valve. No acute osseous abnormality. No focal soft tissue abnormality. Paraumbilical hernia contains fat without bowel. IMPRESSION: 1. Subhepatic abscess with percutaneous drain in place. The collection is mostly collapsed with residual gas and minimal fluid, details limited by beam hardening artifact. 2. Paraumbilical hernia containing fat without bowel. Electronically signed by: Lonni Necessary MD 08/22/2024 10:36 AM EDT RP Workstation: [id number]   US  Abdomen Limited RUQ (LIVER/GB) Result Date:  08/22/2024 EXAM: Right Upper Quadrant Abdominal Ultrasound 08/22/2024 07:23:01 AM TECHNIQUE: Real-time ultrasonography of the right upper quadrant of the abdomen was performed. COMPARISON: None available. CLINICAL HISTORY: RUQ pain FINDINGS: LIVER: Increased parenchymal echogenicity of the liver compatible with steatosis. No intrahepatic biliary ductal dilatation. The portal vein is patent with normal direction towards the liver. BILIARY SYSTEM: Status post cholecystectomy. Common bile duct measures 4.9 mm. OTHER: In the region of the right lower quadrant drainage catheter there is a complex fluid collection noted corresponding with the known hepatic abscess. This is suboptimally evaluated on the current exam. IMPRESSION: 1. Known hepatic abscess in the region of the right lower quadrant drainage catheter, suboptimally evaluated. 2. Status post cholecystectomy. 3. Increased parenchymal echogenicity of the liver compatible with steatosis. Electronically signed by: Waddell Calk MD 08/22/2024 07:28 AM EDT RP Workstation: [id number]   US  EKG SITE RITE Result Date: 08/17/2024 If Site Rite image not attached, placement could not be confirmed due to current cardiac rhythm.  IR PATIENT EVAL TECH 0-60 MINS Result Date: 08/16/2024 Leigh Andrea LABOR, RT     08/16/2024  4:27 PM Tech called to bedside due to bag frequently being filled. Bag was in the process of being emptied when I arrived. Changed gravity drain bag to foley bag and added a Y adapter to flush. Drain was flushed twice and not much coming out now, Dr Vanice Aware.  CT GUIDED PERITONEAL/RETROPERITONEAL FLUID DRAIN BY PERC CATH Result Date: 08/16/2024 INDICATION: RECURRENT CHRONIC LARGE EXOPHYTIC INFECTED HEPATIC CYST EXAM: CT DRAINAGE OF THE RECURRENT INFERIOR INFECTED SUBHEPATIC CYST MEDICATIONS: The patient is currently admitted to the hospital and receiving intravenous antibiotics. The antibiotics were administered within an appropriate time frame  prior to the initiation of the procedure. ANESTHESIA/SEDATION: Moderate (conscious) sedation was employed during this procedure. A total of Versed  1.0 mg and Fentanyl  25 mcg was administered intravenously by the radiology nurse. Total intra-service moderate Sedation Time: 11 minutes. The patient's level of consciousness and vital signs were monitored continuously by radiology nursing throughout the procedure under my direct supervision. COMPLICATIONS: None immediate. PROCEDURE: Informed written consent was obtained from the patient after a thorough discussion of the procedural risks, benefits and alternatives. All questions were addressed. Maximal Sterile Barrier Technique was utilized including caps, mask, sterile gowns, sterile gloves, sterile drape, hand hygiene and skin antiseptic. A timeout was performed prior to the initiation of the procedure. Previous imaging reviewed. Patient positioned supine. Noncontrast localization CT performed. The chronic subhepatic exophytic infected cyst was localized and marked for drainage for an anterior approach. Under sterile conditions and local anesthesia, the 18 gauge 10 cm access needle was advanced from a right anterior abdominal approach. Needle position confirmed with CT. Guidewire inserted followed by tract dilatation to insert a 12 [place] drain. Drain catheter position confirmed with CT. Syringe aspiration yielded dark brown blood tinged fluid. Catheter secured with silk suture and a sterile dressing. Gravity drainage bag connected because of the large size. Once the output decreases, gravity bag can be switched to suction bulb. No immediate complication. Patient tolerated the procedure well. IMPRESSION: Successful CT-guided drainage of the infected large subhepatic chronic cyst. Electronically Signed   By: CHRISTELLA.  Shick M.D.   On: 08/16/2024 12:30   US  Abdomen Limited RUQ (LIVER/GB) Addendum Date: 08/16/2024 ADDENDUM REPORT: 08/16/2024 10:26 ADDENDUM: The mass measures  25.7 x 23.1 x 22.7 cm, not 2.6 x 2.3 x 2.3 cm. Electronically Signed   By: Elspeth Bathe M.D.   On: 08/16/2024 10:26   Result Date: 08/16/2024 CLINICAL DATA:  Right upper quadrant abdominal pain. Status post ultrasound-guided drainage of a large cystic mass inferior to the liver on 04/22/2022 with no organism growth on culture of the fluid and rare white blood cells in the fluid. There was no malignancy on cytological evaluation of the fluid. The cytological evaluation did demonstrate numerous acute inflammatory cells and necrotic debris. EXAM: ULTRASOUND ABDOMEN LIMITED RIGHT UPPER QUADRANT COMPARISON:  Ultrasound-guided abdominal cyst aspiration dated 04/16/2022. Abdomen and pelvis CT dated 04/16/2022. FINDINGS: Gallbladder: Surgically absent. Common bile duct: Diameter: 5.0 mm Liver: No focal lesion identified. Within normal limits in parenchymal echogenicity. Portal vein is patent on color Doppler imaging with normal direction of blood flow towards the liver. Other: There has been re-accumulation of fluid containing diffuse internal echoes in the previously aspirated large cystic mass inferior to the liver. This measures 2.6 x 2.3 x 2.3 cm. No internal blood flow was seen with color Doppler. IMPRESSION: 1. Re-accumulation of fluid containing diffuse internal echoes in the previously aspirated large cystic mass inferior to the liver. This measures 2.6 x 2.3 x 2.3 cm. This is compatible with a recurrent complicated cyst of unknown origin based on the previous cytology and culture results. 2. Status post cholecystectomy. 3. Otherwise, normal examination. Electronically Signed: By: Elspeth Bathe M.D. On: 08/12/2024 10:44   US  PELVIC COMPLETE WITH TRANSVAGINAL Result Date: 08/14/2024 CLINICAL DATA:  [id number] Abnormal uterine and vaginal bleeding, unspecified [id number]. Last menstrual period 10 years ago. Postmenopausal EXAM: TRANSABDOMINAL AND TRANSVAGINAL ULTRASOUND OF PELVIS TECHNIQUE: Both transabdominal and  transvaginal ultrasound examinations of the pelvis were performed. Transabdominal technique was performed for global imaging of the pelvis including uterus, ovaries, adnexal regions, and pelvic cul-de-sac. It was necessary to proceed with endovaginal exam following the transabdominal exam to visualize the endometrium and bilateral ovaries. COMPARISON:  CT abdomen pelvis 04/16/2022 FINDINGS: Uterus Measurements: 9.8 x 6 x 4 cm = volume: 121 mL. Posterior intramural uterine fibroid measuring up to 4.3 cm. Endometrium Thickness: 14 mm. Right ovary Not visualized. Left ovary Not visualized. Other findings No abnormal free fluid. IMPRESSION: 1. Thickened endometrium in a postmenopausal patient. Recommend gynecologic consultation as underlying malignancy not excluded. 2. Intramural uterine fibroid. 3. Bilateral ovaries not visualized. Electronically Signed   By: Morgane  Naveau M.D.   On: 08/14/2024 19:19   US  Abdomen Limited RUQ (LIVER/GB) Result Date: 08/13/2024 CLINICAL DATA:  Follow-up cystic liver lesion following needle aspiration. EXAM: ULTRASOUND ABDOMEN LIMITED COMPARISON:  08/12/2024 FINDINGS: A thick-walled cystic lesion is seen involving the liver. This currently measures 17.5 x 15.0 x 17.6 cm, compared to 25.7 x 23.7 x 23.0 cm previously. IMPRESSION: Large complex thick-walled cystic lesion involving the liver shows mild decrease in size since previous study. Electronically Signed   By: Norleen DELENA Kil M.D.   On: 08/13/2024 15:47   IR US  Guide Bx Asp/Drain Result Date: 08/13/2024 INDICATION: Chronic very large cystic abdominal mass presumed to be a recurrent exophytic liver cyst EXAM: ULTRASOUND ASPIRATION AND DRAINAGE OF THE LARGE ABDOMINAL CYSTIC MASS MEDICATIONS: The patient is currently admitted to the hospital and receiving intravenous antibiotics. The antibiotics were administered within an appropriate time frame prior to the initiation of the procedure. ANESTHESIA/SEDATION: None. COMPLICATIONS: None  immediate. PROCEDURE: Informed written consent was obtained from the patient after a thorough discussion of the procedural risks, benefits and alternatives. All questions were addressed. Maximal Sterile Barrier Technique was utilized including caps, mask, sterile gowns, sterile gloves, sterile drape, hand hygiene and skin antiseptic. A timeout was performed prior to the initiation of the procedure. Previous imaging reviewed. Preliminary ultrasound performed. The large abdominal cystic mass was localized and marked percutaneous needle access in the midline. Under sterile conditions and local anesthesia, the cystic lesion was accessed with a 6 French Safe-T-Centesis needle catheter. Needle position confirmed with ultrasound. Images obtained for documentation. Suction aspiration yielded 5 L of debris-filled brown cystic fluid. Sample sent for culture and cytology. Postprocedure imaging demonstrates significant collapse of the cystic lesion. No immediate complication. Patient tolerated the procedure well. IMPRESSION: Successful ultrasound aspiration of the large abdominal cystic mass. 5 L of debris-filled brown cystic fluid removed. Sample sent for culture and cytology. Electronically Signed   By: CHRISTELLA.  Shick M.D.   On: 08/13/2024 07:57   ECHOCARDIOGRAM COMPLETE Result Date: 08/12/2024    ECHOCARDIOGRAM REPORT   Patient Name:   MARTENA EMANUELE Date of Exam: 08/12/2024 Medical Rec #:  [id number]      Height:       64.0 in Accession #:    [id number]     Weight:       581.0 lb Date of Birth:  01-25-1976      BSA:          3.078 m Patient Age:    48 years       BP:           131/73 mmHg Patient  Gender: F              HR:           104 bpm. Exam Location:  Inpatient Procedure: 2D Echo, Cardiac Doppler, Color Doppler and Intracardiac            Opacification Agent (Both Spectral and Color Flow Doppler were            utilized during procedure). Indications:    Syncope  History:        Patient has prior history of Echocardiogram  examinations, most                 recent 04/17/2022. Risk Factors:Hypertension.  Sonographer:    Therisa Crouch Referring Phys: [id number] ELSIE KATHEE SAVANNAH  Sonographer Comments: Image acquisition challenging due to patient body habitus. IMPRESSIONS  1. Left ventricular ejection fraction, by estimation, is 60 to 65%. The left ventricle has normal function. The left ventricle has no regional wall motion abnormalities. Left ventricular diastolic parameters are consistent with Grade I diastolic dysfunction (impaired relaxation).  2. Right ventricular systolic function is normal. The right ventricular size is normal.  3. The mitral valve is normal in structure. Trivial mitral valve regurgitation. No evidence of mitral stenosis.  4. The aortic valve is tricuspid. There is mild calcification of the aortic valve. Aortic valve regurgitation is not visualized. No aortic stenosis is present.  5. The inferior vena cava is normal in size with greater than 50% respiratory variability, suggesting right atrial pressure of 3 mmHg. Conclusion(s)/Recommendation(s): On subcostal imaging there is a large cystic structure adjacent to the liver. This has been previously noted on abdoimant CT imaging. FINDINGS  Left Ventricle: Left ventricular ejection fraction, by estimation, is 60 to 65%. The left ventricle has normal function. The left ventricle has no regional wall motion abnormalities. The left ventricular internal cavity size was normal in size. There is  no left ventricular hypertrophy. Left ventricular diastolic parameters are consistent with Grade I diastolic dysfunction (impaired relaxation). Right Ventricle: The right ventricular size is normal. No increase in right ventricular wall thickness. Right ventricular systolic function is normal. Left Atrium: Left atrial size was normal in size. Right Atrium: Right atrial size was normal in size. Pericardium: There is no evidence of pericardial effusion. Mitral Valve: The mitral valve is  normal in structure. Mild mitral annular calcification. Trivial mitral valve regurgitation. No evidence of mitral valve stenosis. Tricuspid Valve: The tricuspid valve is normal in structure. Tricuspid valve regurgitation is trivial. No evidence of tricuspid stenosis. Aortic Valve: The aortic valve is tricuspid. There is mild calcification of the aortic valve. Aortic valve regurgitation is not visualized. No aortic stenosis is present. Pulmonic Valve: The pulmonic valve was normal in structure. Pulmonic valve regurgitation is not visualized. No evidence of pulmonic stenosis. Aorta: The aortic root is normal in size and structure. Venous: The inferior vena cava is normal in size with greater than 50% respiratory variability, suggesting right atrial pressure of 3 mmHg. IAS/Shunts: No atrial level shunt detected by color flow Doppler.  LEFT VENTRICLE PLAX 2D LVIDd:         4.88 cm   Diastology LVIDs:         2.97 cm   LV e' medial:    7.83 cm/s LV PW:         1.31 cm   LV E/e' medial:  11.3 LV IVS:        1.29 cm   LV e' lateral:  14.30 cm/s LVOT diam:     2.13 cm   LV E/e' lateral: 6.2 LVOT Area:     3.56 cm  RIGHT VENTRICLE             IVC RV S prime:     16.50 cm/s  IVC diam: 1.91 cm TAPSE (M-mode): 1.7 cm LEFT ATRIUM             Index LA diam:        4.56 cm 1.48 cm/m LA Vol (A2C):   48.7 ml 15.82 ml/m LA Vol (A4C):   61.1 ml 19.85 ml/m LA Biplane Vol: 55.8 ml 18.13 ml/m   AORTA Ao Root diam: 3.19 cm Ao Asc diam:  2.81 cm MITRAL VALVE MV Area (PHT): 5.54 cm     SHUNTS MV Decel Time: 137 msec     Systemic Diam: 2.13 cm MV E velocity: 88.80 cm/s MV A velocity: 108.00 cm/s MV E/A ratio:  0.82 Toribio Fuel MD Electronically signed by Toribio Fuel MD Signature Date/Time: 08/12/2024/1:20:52 PM    Final    DG Chest Portable 1 View Result Date: 08/12/2024 CLINICAL DATA:  Chest pain. Tachycardia. History of pulmonary embolism. EXAM: PORTABLE CHEST - 1 VIEW COMPARISON:  04/07/2022 FINDINGS: Cardiomediastinal  silhouette and pulmonary vasculature are within normal limits. RIGHT basilar opacity may be atelectasis or pneumonia. Lungs are otherwise clear. IMPRESSION: RIGHT basilar opacity may be due to atelectasis or pneumonia. Electronically Signed   By: Aliene Lloyd M.D.   On: 08/12/2024 09:24    Assessment and Plan:  1. PMB (postmenopausal bleeding) (Primary) ***  2. IUD (intrauterine device) in place ***    Diagnoses and all orders for this visit:  PMB (postmenopausal bleeding)  IUD (intrauterine device) in place     No orders of the defined types were placed in this encounter.    Routine preventative health maintenance measures emphasized. Please refer to After Visit Summary for other counseling recommendations.   No follow-ups on file.  Vina Solian, MD, FACOG Obstetrician & Gynecologist, [facility] for [facility], [facility]

## 2024-08-31 ENCOUNTER — Ambulatory Visit: Admitting: Obstetrics and Gynecology

## 2024-08-31 ENCOUNTER — Ambulatory Visit (HOSPITAL_COMMUNITY)
Admission: RE | Admit: 2024-08-31 | Discharge: 2024-08-31 | Disposition: A | Discharge status: Home / Self Care | Source: Ambulatory Visit | Attending: Radiology | Admitting: Radiology

## 2024-08-31 ENCOUNTER — Ambulatory Visit (HOSPITAL_COMMUNITY)
Admission: RE | Admit: 2024-08-31 | Discharge: 2024-08-31 | Disposition: A | Discharge status: Home / Self Care | Source: Ambulatory Visit | Attending: Internal Medicine | Admitting: Internal Medicine

## 2024-08-31 DIAGNOSIS — K7689 Other specified diseases of liver: Secondary | ICD-10-CM | POA: Insufficient documentation

## 2024-08-31 DIAGNOSIS — Z975 Presence of (intrauterine) contraceptive device: Secondary | ICD-10-CM

## 2024-08-31 DIAGNOSIS — N950 Postmenopausal bleeding: Secondary | ICD-10-CM

## 2024-08-31 HISTORY — PX: IR RADIOLOGIST EVAL & MGMT: IMG5224

## 2024-08-31 MED ORDER — IOHEXOL 350 MG/ML SOLN
100.0000 mL | Freq: Once | INTRAVENOUS | Status: AC | PRN
  Administered 2024-08-31: 100 mL via INTRAVENOUS

## 2024-08-31 NOTE — Progress Notes (Signed)
 Brief Interventional Radiology Note:   48 year old female with a history of complex right abdominal cyst. She underwent aspiration in IR on 8/22 with Dr. Vanice in which 5L of fluid was aspirated from the cyst. Culture from fluid grew klebsiella pneumoniae and IR subsequently placed a drain on 8/25 with Dr. ONEIDA Vanice. During her previous hospital stay, case had been discussed with Dr. KANDICE Banner who requested close follow up with patient.  Patient presents today for follow up imaging and evaluation. She reports continued output of approximately 200cc daily. Continues to flush drain without difficulty. Denies any pain around drain site, fevers/chills, nausea/vomiting. Insertion site is clean and drain w/ ~50cc serous output in bag. Briefly discussed repeat imaging with patient and recommended continued at home care for the next few weeks. Also discussed the possibility of sclerotherapy with patient who reports she would be open to pursuing this if she was deemed a good candidate.   Patient to follow up with Dr. Banner in clinic in the next 3-4 weeks to discuss continued maintenance and/or possible sclerotherapy.   Patient is agreeable to this plan. All questions and concerns answered prior to discharge.   Electronically Signed: Beckham Capistran M Azora Bonzo, PA-C 08/31/2024, 2:18 PM

## 2024-09-01 ENCOUNTER — Encounter: Payer: Self-pay | Admitting: Family Medicine

## 2024-09-01 ENCOUNTER — Ambulatory Visit (INDEPENDENT_AMBULATORY_CARE_PROVIDER_SITE_OTHER): Admitting: Family Medicine

## 2024-09-01 VITALS — BP 134/92 | HR 112 | Ht 64.0 in | Wt >= 6400 oz

## 2024-09-01 DIAGNOSIS — K750 Abscess of liver: Secondary | ICD-10-CM | POA: Diagnosis not present

## 2024-09-01 DIAGNOSIS — E66813 Obesity, class 3: Secondary | ICD-10-CM

## 2024-09-01 DIAGNOSIS — R7309 Other abnormal glucose: Secondary | ICD-10-CM

## 2024-09-01 DIAGNOSIS — I10 Essential (primary) hypertension: Secondary | ICD-10-CM | POA: Diagnosis not present

## 2024-09-01 DIAGNOSIS — Z8679 Personal history of other diseases of the circulatory system: Secondary | ICD-10-CM | POA: Diagnosis not present

## 2024-09-01 DIAGNOSIS — Z09 Encounter for follow-up examination after completed treatment for conditions other than malignant neoplasm: Secondary | ICD-10-CM

## 2024-09-01 DIAGNOSIS — Z6841 Body Mass Index (BMI) 40.0 and over, adult: Secondary | ICD-10-CM

## 2024-09-01 MED ORDER — RIVAROXABAN 20 MG PO TABS: 20.0000 mg | ORAL_TABLET | Freq: Every day | ORAL | 0 refills | Status: DC

## 2024-09-01 NOTE — Progress Notes (Signed)
 Established Patient Office Visit  Subjective    Patient ID: Susan Warren, female    DOB: 06-Feb-1976  Age: 48 y.o. MRN: [id number]  CC:  Chief Complaint  Patient presents with   Hospitalization Follow-up    HPI Susan Warren presents for to establish with a new provider and for routine hospital discharge follow up. She was admitted with RUQ 2/2 hepatic abscess.   Outpatient Encounter Medications as of 09/01/2024  Medication Sig   acetaminophen  (TYLENOL ) 325 MG tablet Take 2 tablets (650 mg total) by mouth every 6 (six) hours as needed for mild pain (or Fever >/= 101).   cefTRIAXone  (ROCEPHIN ) IVPB Inject 2 g into the vein daily for 27 days. Indication: Klebsiella Pneumoniae Intra-abdominal infection First Dose: Yes Last Day of Therapy: 09/13/24 Labs - Once weekly:  CBC/D and BMP, Labs - Once weekly: ESR and CRP Method of administration: IV Push Method of administration may be changed at the discretion of home infusion pharmacist based upon assessment of the patient and/or caregiver's ability to self-administer the medication ordered.   cyanocobalamin  (CVS VITAMIN B12) 1000 MCG tablet Take 1 tablet (1,000 mcg total) by mouth daily.   diclofenac  Sodium (VOLTAREN ) 1 % GEL Apply 4 g topically 4 (four) times daily.   methocarbamol  (ROBAXIN ) 750 MG tablet Take 1 tablet (750 mg total) by mouth every 6 (six) hours as needed for muscle spasms.   metroNIDAZOLE  (FLAGYL ) 500 MG tablet Take 1 tablet (500 mg total) by mouth 2 (two) times daily for 27 days.   ondansetron  (ZOFRAN -ODT) 4 MG disintegrating tablet Take 1 tablet (4 mg total) by mouth every 8 (eight) hours as needed for nausea or vomiting.   senna-docusate (SENOKOT-S) 8.6-50 MG tablet Take 1 tablet by mouth at bedtime as needed for mild constipation.   sodium chloride  flush 0.9 % SOLN injection Flush 5-10mL of saline into catheter daily to prevent clogging   [DISCONTINUED] rivaroxaban  (XARELTO ) 20 MG TABS tablet Take 1  tablet (20 mg total) by mouth daily with supper.   cyanocobalamin  1000 MCG tablet Take 1 tablet (1,000 mcg total) by mouth daily. (Patient not taking: Reported on 09/01/2024)   rivaroxaban  (XARELTO ) 20 MG TABS tablet Take 1 tablet (20 mg total) by mouth daily with supper.   No facility-administered encounter medications on file as of 09/01/2024.    Past Medical History:  Diagnosis Date   Abdominal pain due to right abdominal and pelvic cystic mass 04/16/2022   Allergy    Anxiety    Candidemia (HCC) 04/18/2022   Depression    DVT (deep venous thrombosis) (HCC)    GERD (gastroesophageal reflux disease)    Hepatic cyst    Hypertension    Lymphedema of both lower extremities    Migraines    Morbid obesity with BMI of 70 and over, adult [facility])    PE (pulmonary thromboembolism) (HCC) 03/2019   Pneumonia 04/09/2022   Sleep apnea     Past Surgical History:  Procedure Laterality Date   CHOLECYSTECTOMY     IR INFUSION THROMBOL ARTERIAL INITIAL (MS)  03/29/2019   IR INFUSION THROMBOL ARTERIAL INITIAL (MS)  03/29/2019   IR PATIENT EVAL TECH 0-60 MINS  08/16/2024   IR THROMB F/U EVAL ART/VEN FINAL DAY (MS)  03/30/2019   IR US  GUIDE BX ASP/DRAIN  08/12/2024   IR US  GUIDE VASC ACCESS RIGHT  03/29/2019   TEE WITHOUT CARDIOVERSION N/A 04/23/2022   Procedure: TRANSESOPHAGEAL ECHOCARDIOGRAM (TEE);  Surgeon: Francyne Headland, MD;  Location:  [facility];  Service: Cardiovascular;  Laterality: N/A;    Family History  Problem Relation Age of Onset   Hypertension Mother    Thyroid disease Mother    Hypertension Maternal Grandmother    Diabetes Maternal Grandmother    Breast cancer Maternal Grandmother    Stroke Maternal Grandmother    Heart attack Neg Hx     Social History   Socioeconomic History   Marital status: Widowed    Spouse name: Not on file   Number of children: Not on file   Years of education: 12   Highest education level: Associate degree: occupational, Scientist, product/process development, or vocational program   Occupational History   Occupation: Admin.    Employer: [employer]  Tobacco Use   Smoking status: Never   Smokeless tobacco: Never  Vaping Use   Vaping status: Never Used  Substance and Sexual Activity   Alcohol use: Yes    Comment: occ   Drug use: No   Sexual activity: Not Currently    Birth control/protection: I.U.D.  Other Topics Concern   Not on file  Social History Narrative   Married, 1 child   Social Drivers of Corporate investment banker Strain: Low Risk  (12/11/2023)   Overall Financial Resource Strain (CARDIA)    Difficulty of Paying Living Expenses: Not hard at all  Food Insecurity: Food Insecurity Present (12/11/2023)   Hunger Vital Sign    Worried About Running Out of Food in the Last Year: Never true    Ran Out of Food in the Last Year: Sometimes true  Transportation Needs: No Transportation Needs (12/11/2023)   PRAPARE - Administrator, Civil Service (Medical): No    Lack of Transportation (Non-Medical): No  Physical Activity: Insufficiently Active (12/11/2023)   Exercise Vital Sign    Days of Exercise per Week: 2 days    Minutes of Exercise per Session: 10 min  Stress: No Stress Concern Present (12/11/2023)   [employer] of Occupational Health - Occupational Stress Questionnaire    Feeling of Stress : Only a little  Social Connections: Unknown (12/11/2023)   Social Connection and Isolation Panel    Frequency of Communication with Friends and Family: More than three times a week    Frequency of Social Gatherings with Friends and Family: Once a week    Attends Religious Services: Patient declined    Database administrator or Organizations: No    Attends Engineer, structural: Not on file    Marital Status: Widowed  Intimate Partner Violence: Not on file    Review of Systems  All other systems reviewed and are negative.       Objective    BP (!) 134/92   Pulse (!) 112   Ht 5' 4 (1.626 m)   Wt (!) 456 lb 3.2  oz (206.9 kg)   SpO2 94%   BMI 78.31 kg/m   Physical Exam Vitals and nursing note reviewed.  Constitutional:      General: She is not in acute distress.    Appearance: She is obese.  Cardiovascular:     Rate and Rhythm: Normal rate and regular rhythm.  Pulmonary:     Effort: Pulmonary effort is normal.     Breath sounds: Normal breath sounds.  Abdominal:     Palpations: Abdomen is soft.     Tenderness: There is no abdominal tenderness.  Neurological:     General: No focal deficit present.     Mental  Status: She is alert and oriented to person, place, and time.         Assessment & Plan:   1. Hospital discharge follow-up (Primary) Improved since discharge  2. Hepatic abscess Keep appts with ID and IR as scheduled. Ptient completing course of outpatient iv antibiotics.   3. History of ventricular tachycardia No further sx. Patient to keep scheduled appts with cardiology  4. Essential hypertension As above. Slightly elevated readings.  - Basic Metabolic Panel - Lipid Panel  5. Elevated hemoglobin A1c Will montior  6. Class 3 severe obesity due to excess calories with serious comorbidity and body mass index (BMI) greater than or equal to 70 in adult   Return in about 6 weeks (around 10/13/2024) for follow up.   Tanda Raguel SQUIBB, MD

## 2024-09-02 LAB — CULTURE, FUNGUS WITHOUT SMEAR

## 2024-09-06 ENCOUNTER — Encounter: Payer: Self-pay | Admitting: Internal Medicine

## 2024-09-06 ENCOUNTER — Other Ambulatory Visit: Payer: Self-pay | Admitting: Internal Medicine

## 2024-09-06 ENCOUNTER — Other Ambulatory Visit: Payer: Self-pay

## 2024-09-06 ENCOUNTER — Ambulatory Visit: Admitting: Internal Medicine

## 2024-09-06 VITALS — BP 124/64 | HR 115 | Temp 98.0°F | Wt >= 6400 oz

## 2024-09-06 DIAGNOSIS — Z79899 Other long term (current) drug therapy: Secondary | ICD-10-CM | POA: Diagnosis not present

## 2024-09-06 DIAGNOSIS — K7689 Other specified diseases of liver: Secondary | ICD-10-CM

## 2024-09-06 DIAGNOSIS — K750 Abscess of liver: Secondary | ICD-10-CM | POA: Diagnosis not present

## 2024-09-06 MED ORDER — AMOXICILLIN-POT CLAVULANATE 875-125 MG PO TABS: 1.0000 | ORAL_TABLET | Freq: Two times a day (BID) | ORAL | 1 refills | Status: DC

## 2024-09-06 NOTE — Patient Instructions (Signed)
 After you finish iv abtx, also stop taking metronidazole  on 9/22, then on 9/23, please start taking augmentin  875mg  bid until I see you

## 2024-09-06 NOTE — Progress Notes (Unsigned)
 Patient ID: Susan Warren, female   DOB: March 18, 1976, 48 y.o.   MRN: [id number]  HPI Susan Warren is a 48yo F with large hepatic abscess. She underwent IR perc abscess drain 8/25. She was discharge on  4 weeks iv abx with ertapenem through 09/13/24, and transition to augmentin  afterward. She had repeat abd CT that did still showed larged fluid collection. She still reports daily from her hepatic drain. She denies fever or chills.   CT  on 9/9 showed:Hepatobiliary:  thick wall decompressed structure extending from the inferior margin of the liver difficult to measure given its infolding and extension into multiple planes measuring proximally 17.3 x 5.5 cm on image 50/3. There is minimal fluid and gas within the structure, which previously reflected a fluid-filled cyst with peripheral calcifications on CT August 16, 2024 measuring 22.7 x 19.1 cm. There is a small amount of free fluid adjacent to this cyst and tracking along the anterior aspect of the liver which has slightly increased from CT August 22, 2024.   Outpatient Encounter Medications as of 09/06/2024  Medication Sig   acetaminophen  (TYLENOL ) 325 MG tablet Take 2 tablets (650 mg total) by mouth every 6 (six) hours as needed for mild pain (or Fever >/= 101).   cefTRIAXone  (ROCEPHIN ) IVPB Inject 2 g into the vein daily for 27 days. Indication: Klebsiella Pneumoniae Intra-abdominal infection First Dose: Yes Last Day of Therapy: 09/13/24 Labs - Once weekly:  CBC/D and BMP, Labs - Once weekly: ESR and CRP Method of administration: IV Push Method of administration may be changed at the discretion of home infusion pharmacist based upon assessment of the patient and/or caregiver's ability to self-administer the medication ordered.   cyanocobalamin  (CVS VITAMIN B12) 1000 MCG tablet Take 1 tablet (1,000 mcg total) by mouth daily.   cyanocobalamin  1000 MCG tablet Take 1 tablet (1,000 mcg total) by mouth daily.   diclofenac  Sodium  (VOLTAREN ) 1 % GEL Apply 4 g topically 4 (four) times daily.   methocarbamol  (ROBAXIN ) 750 MG tablet Take 1 tablet (750 mg total) by mouth every 6 (six) hours as needed for muscle spasms.   metroNIDAZOLE  (FLAGYL ) 500 MG tablet Take 1 tablet (500 mg total) by mouth 2 (two) times daily for 27 days.   ondansetron  (ZOFRAN -ODT) 4 MG disintegrating tablet Take 1 tablet (4 mg total) by mouth every 8 (eight) hours as needed for nausea or vomiting.   rivaroxaban  (XARELTO ) 20 MG TABS tablet Take 1 tablet (20 mg total) by mouth daily with supper.   senna-docusate (SENOKOT-S) 8.6-50 MG tablet Take 1 tablet by mouth at bedtime as needed for mild constipation.   sodium chloride  flush 0.9 % SOLN injection Flush 5-10mL of saline into catheter daily to prevent clogging   No facility-administered encounter medications on file as of 09/06/2024.     Patient Active Problem List   Diagnosis Date Noted   Intra-abdominal abscess (HCC) 08/22/2024   Hepatic abscess 08/16/2024   Intraabdominal fluid collection 08/13/2024   RUQ abdominal pain 08/12/2024   Colon cancer screening 06/17/2022   Cervical cancer screening 06/17/2022   Herpes simplex labialis 04/21/2022   Vitamin B12 deficiency 04/19/2022   Normocytic anemia 04/18/2022   Right abdominal and pelvic cystic mass 04/16/2022   Supratherapeutic INR 04/16/2022   Morbid obesity with body mass index of 70 and over in adult [facility])    Leg DVT (deep venous thromboembolism), chronic, left (HCC) 04/12/2019   Hepatic cyst 04/12/2019   Lymphedema of both lower extremities  History of pulmonary embolism 03/29/2019   Preventative health care 01/25/2014   Hypertension    Depression    GERD (gastroesophageal reflux disease)    Allergy    Migraines    Anxiety    IUD (intrauterine device) in place 03/10/2013     Health Maintenance Due  Topic Date Due   Hepatitis B Vaccines 19-59 Average Risk (1 of 3 - 19+ 3-dose series) Never done   Mammogram  Never done    Cervical Cancer Screening (HPV/Pap Cotest)  11/24/2015   Fecal DNA (Cologuard)  Never done   DTaP/Tdap/Td (2 - Td or Tdap) 11/23/2022   Pneumococcal Vaccine: 50+ Years (1 of 1 - PCV) Never done   Zoster Vaccines- Shingrix (1 of 2) Never done   Influenza Vaccine  Never done   COVID-19 Vaccine (3 - 2025-26 season) 08/23/2024     Review of Systems 12 point ros is otherwise negative Physical Exam   BP 124/64   Pulse (!) 115   Temp 98 F (36.7 C) (Oral)   Wt (!) 455 lb (206.4 kg)   SpO2 96%   BMI 78.10 kg/m    Physical Exam  Constitutional:  oriented to person, place, and time. appears well-developed and well-nourished. No distress.  HENT: [place]/AT, PERRLA, no scleral icterus Mouth/Throat: Oropharynx is clear and moist. No oropharyngeal exudate.  Cardiovascular: Normal rate, regular rhythm and normal heart sounds. Exam reveals no gallop and no friction rub.  No murmur heard.  Pulmonary/Chest: Effort normal and breath sounds normal. No respiratory distress.  has no wheezes.  Neck = supple, no nuchal rigidity Abdominal: Soft. Bowel sounds are normal.  exhibits no distension. There is no tenderness. Drain serous fluid in collection Lymphadenopathy: no cervical adenopathy. No axillary adenopathy Neurological: alert and oriented to person, place, and time.  Skin: Skin is warm and dry. No rash noted. No erythema.  Psychiatric: a normal mood and affect.  behavior is normal.    CBC Lab Results  Component Value Date   WBC 10.2 08/22/2024   RBC 3.79 (L) 08/22/2024   HGB 9.2 (L) 08/22/2024   HCT 31.9 (L) 08/22/2024   PLT 608 (H) 08/22/2024   MCV 84.2 08/22/2024   MCH 24.3 (L) 08/22/2024   MCHC 28.8 (L) 08/22/2024   RDW 17.2 (H) 08/22/2024   LYMPHSABS 1.3 08/18/2024   MONOABS 0.8 08/18/2024   EOSABS 0.2 08/18/2024    BMET Lab Results  Component Value Date   NA 135 08/22/2024   K 3.9 08/22/2024   CL 98 08/22/2024   CO2 24 08/22/2024   GLUCOSE 171 (H) 08/22/2024   BUN 14  08/22/2024   CREATININE 1.00 08/22/2024   CALCIUM 8.8 (L) 08/22/2024   GFRNONAA >60 08/22/2024   GFRAA 115 04/15/2019     Assessment and Plan Hepatic abscess = Plan for Iv abtx to continue through 9/22, and start oral abtx, amox/clav 875mg  bid on 9/23. Still need to continue through when she has drain removed.  Long term medicaiton management = recent labs showing improvement

## 2024-09-08 ENCOUNTER — Inpatient Hospital Stay: Admitting: Nurse Practitioner

## 2024-09-08 ENCOUNTER — Telehealth: Payer: Self-pay

## 2024-09-08 NOTE — Telephone Encounter (Signed)
 Received fax from [facility] for short term disability.  Copy of form placed in provider's box and in triage accordion folder. Will secure chat provider to make her aware of form.  Lorenda CHRISTELLA Code, RMA

## 2024-09-09 ENCOUNTER — Encounter (HOSPITAL_COMMUNITY): Payer: Self-pay

## 2024-09-24 ENCOUNTER — Ambulatory Visit: Admitting: Pharmacist

## 2024-09-29 ENCOUNTER — Ambulatory Visit
Admission: RE | Admit: 2024-09-29 | Discharge: 2024-09-29 | Disposition: A | Source: Ambulatory Visit | Attending: Radiology

## 2024-09-29 ENCOUNTER — Ambulatory Visit
Admission: RE | Admit: 2024-09-29 | Discharge: 2024-09-29 | Disposition: A | Source: Ambulatory Visit | Attending: Internal Medicine

## 2024-09-29 ENCOUNTER — Other Ambulatory Visit: Payer: Self-pay | Admitting: Internal Medicine

## 2024-09-29 ENCOUNTER — Other Ambulatory Visit: Payer: Self-pay | Admitting: Family Medicine

## 2024-09-29 ENCOUNTER — Ambulatory Visit
Admission: RE | Admit: 2024-09-29 | Discharge: 2024-09-29 | Disposition: A | Discharge status: Home / Self Care | Source: Ambulatory Visit | Attending: Internal Medicine | Admitting: Internal Medicine

## 2024-09-29 DIAGNOSIS — K7689 Other specified diseases of liver: Secondary | ICD-10-CM

## 2024-09-29 HISTORY — PX: IR RADIOLOGIST EVAL & MGMT: IMG5224

## 2024-09-29 MED ORDER — IOPAMIDOL (ISOVUE-300) INJECTION 61%
100.0000 mL | Freq: Once | INTRAVENOUS | Status: AC | PRN
  Administered 2024-09-29: 100 mL via INTRAVENOUS

## 2024-09-29 NOTE — Telephone Encounter (Unsigned)
 Copied from CRM #[id number]. Topic: Clinical - Medication Refill >> Sep 29, 2024  9:37 AM Donna BRAVO wrote: Patient has appt on 10/19/24 and is asking for enough medication to hold over until appt    Medication:  rivaroxaban  (XARELTO ) 20 MG TABS tablet  Has the patient contacted their pharmacy? No Bottle says no refills   This is the patient's preferred pharmacy:   [id number] [place], [place] - [address] [facility] [address] [place] [address] Phone: [phone] Fax: [phone]   Is this the correct pharmacy for this prescription? Yes If no, delete pharmacy and type the correct one.   Has the prescription been filled recently? Yes  Is the patient out of the medication? No  Has the patient been seen for an appointment in the last year OR does the patient have an upcoming appointment? Yes  Can we respond through MyChart? Yes  Agent: Please be advised that Rx refills may take up to 3 business days. We ask that you follow-up with your pharmacy.

## 2024-09-29 NOTE — Progress Notes (Signed)
 Referring Physician(s): Vu,Trung T Dr Montie Bologna  Supervising Physician: Jenna Hacker  Patient Status:  [name]  Chief Complaint:  Known history subhepatic cyst 2 years ago.  Pt had this cyst drained while in hospital - with 7 liters removed per pt. No drain was ever placed New complex right hepatic cyst.  She underwent aspiration in IR on 8/22 with Dr. Vanice in which 5L of fluid was aspirated from the cyst. Culture from fluid grew klebsiella pneumoniae and IR subsequently placed a drain on 8/25 with Dr. ONEIDA Vanice. During her previous hospital stay, case had been discussed with Dr. KANDICE Jenna who requested close follow up with patient.  Subjective:   Last seen in  IR Clinic 08/31/24 Still with high OP from drain; flushing daily CT that day revealed smaller abscess yet not yet completely resolved. Rec: continued drain and return 3 weeks Pt here today for follow up CT and drain evaluation  She follows with ID Dr Bologna- last seen 09/06/24: changed from IV antibx on 9/22 to Augmentin  875 mg BID on 9/23 Continues antibiotics now - to see Dr Bologna 10/13/24  OP now 200 cc/daily (was 200 cc x 2 daily); brownish color +flushes 10 cc twice daily Denies fever; chills; redness +pain at drain site   Allergies: Adhesive [tape]  Medications: Prior to Admission medications   Medication Sig Start Date End Date Taking? Authorizing Provider  acetaminophen  (TYLENOL ) 325 MG tablet Take 2 tablets (650 mg total) by mouth every 6 (six) hours as needed for mild pain (or Fever >/= 101). 04/11/22   Elgergawy, Brayton RAMAN, MD  amoxicillin -clavulanate (AUGMENTIN ) 875-125 MG tablet Take 1 tablet by mouth 2 (two) times daily. 09/06/24   Bologna Montie, MD  cyanocobalamin  ([employer]S VITAMIN B12) 1000 MCG tablet Take 1 tablet (1,000 mcg total) by mouth daily. 01/28/23   Brien Belvie BRAVO, MD  cyanocobalamin  1000 MCG tablet Take 1 tablet (1,000 mcg total) by mouth daily. 08/19/24   Waymond Cart, MD  diclofenac   Sodium (VOLTAREN ) 1 % GEL Apply 4 g topically 4 (four) times daily. 08/19/24   Waymond Cart, MD  ondansetron  (ZOFRAN -ODT) 4 MG disintegrating tablet Take 1 tablet (4 mg total) by mouth every 8 (eight) hours as needed for nausea or vomiting. 08/19/24   Waymond Cart, MD  rivaroxaban  (XARELTO ) 20 MG TABS tablet Take 1 tablet (20 mg total) by mouth daily with supper. 09/01/24   Tanda Bleacher, MD  senna-docusate (SENOKOT-S) 8.6-50 MG tablet Take 1 tablet by mouth at bedtime as needed for mild constipation. 08/19/24   Waymond Cart, MD  sodium chloride  flush 0.9 % SOLN injection Flush 5-10mL of saline into catheter daily to prevent clogging 08/17/24   McInnis, Caitlyn M, PA-C     Vital Signs: There were no vitals taken for this visit.  Physical Exam Skin:    Comments: Drain site is clean and dry No sign of infection Suture has released--- not holding catheter in place- drain catheter is still in good location Stat lock drain secure device was placed Gravity bag changed to JP suction bub per Dr Jenna order        Imaging: No results found.  Labs:  CBC: Recent Labs    08/17/24 0411 08/18/24 0302 08/19/24 0610 08/22/24 0623  WBC 12.7* 12.9* 11.8* 10.2  HGB 8.8* 8.3* 8.4* 9.2*  HCT 29.9* 28.2* 28.1* 31.9*  PLT 539* 507* 543* 608*    COAGS: Recent Labs    08/14/24 0338 08/15/24 0334 08/16/24 9646  08/17/24 0411  INR 3.4* 3.0* 2.8* 3.2*    BMP: Recent Labs    08/17/24 0411 08/18/24 0302 08/19/24 0610 08/22/24 0623  NA 131* 132* 133* 135  K 4.5 4.6 4.4 3.9  CL 96* 96* 96* 98  CO2 25 26 26 24   GLUCOSE 228* 183* 207* 171*  BUN 9 8 9 14   CALCIUM 8.2* 8.1* 8.3* 8.8*  CREATININE 0.62 0.64 0.73 1.00  GFRNONAA >60 >60 >60 >60    LIVER FUNCTION TESTS: Recent Labs    08/16/24 0353 08/18/24 0302 08/19/24 0610 08/22/24 0623  BILITOT 0.7 0.4 0.4 0.4  AST 29 18 15 17   ALT 31 19 15 14   ALKPHOS 135* 117 101 91  PROT 7.4 6.8 7.1 7.4  ALBUMIN 1.7* 1.6* 1.5* 1.9*     Assessment and Plan:  CT scan performed today- pending result With OP still at 200 cc daily; will keep to drain. Change from gravity bag to JP bulb per Dr Jenna Plan:  pt is to call Clinic when OP has diminished to 30 cc daily or in 1 month- whichever comes first. Will need CT and discussion as to next plan. Pt is interested in sclerotherapy for this existing cyst since it is second time to occur. She is encouraged to keep appt 10/22 with Dr Luiz Pt has good understanding of plan   Electronically Signed: Sharlet DELENA Candle, PA-C 09/29/2024, 8:06 AM   I spent a total of 25 Minutes at the the patient's bedside AND on the patient's hospital floor or unit, greater than 50% of which was counseling/coordinating care for drain evaluation

## 2024-09-30 MED ORDER — RIVAROXABAN 20 MG PO TABS: 20.0000 mg | ORAL_TABLET | Freq: Every day | ORAL | 0 refills | Status: DC

## 2024-09-30 NOTE — Telephone Encounter (Signed)
 Requested Prescriptions  Pending Prescriptions Disp Refills   rivaroxaban  (XARELTO ) 20 MG TABS tablet 30 tablet 0    Sig: Take 1 tablet (20 mg total) by mouth daily with supper.     Hematology: Anticoagulants - rivaroxaban  Failed - 09/30/2024  4:20 PM      Failed - HCT in normal range and within 360 days    HCT  Date Value Ref Range Status  08/22/2024 31.9 (L) 36.0 - 46.0 % Final   Hematocrit  Date Value Ref Range Status  05/06/2022 36.1 34.0 - 46.6 % Final         Failed - HGB in normal range and within 360 days    Hemoglobin  Date Value Ref Range Status  08/22/2024 9.2 (L) 12.0 - 15.0 g/dL Final  94/84/7976 88.3 11.1 - 15.9 g/dL Final         Failed - PLT in normal range and within 360 days    Platelets  Date Value Ref Range Status  08/22/2024 608 (H) 150 - 400 K/uL Final  05/06/2022 609 (H) 150 - 450 x10E3/uL Final         Passed - ALT in normal range and within 360 days    ALT  Date Value Ref Range Status  08/22/2024 14 0 - 44 U/L Final         Passed - AST in normal range and within 360 days    AST  Date Value Ref Range Status  08/22/2024 17 15 - 41 U/L Final         Passed - Cr in normal range and within 360 days    Creat  Date Value Ref Range Status  05/09/2016 0.74 0.50 - 1.10 mg/dL Final   Creatinine, Ser  Date Value Ref Range Status  08/22/2024 1.00 0.44 - 1.00 mg/dL Final         Passed - eGFR is 15 or above and within 360 days    GFR calc Af Amer  Date Value Ref Range Status  04/15/2019 115 >59 mL/min/1.73 Final   GFR, Estimated  Date Value Ref Range Status  08/22/2024 >60 >60 mL/min Final    Comment:    (NOTE) Calculated using the CKD-EPI Creatinine Equation (2021)    GFR  Date Value Ref Range Status  01/25/2014 84.43 >60.00 mL/min Final   eGFR  Date Value Ref Range Status  05/06/2022 88 >59 mL/min/1.73 Final         Passed - Patient is not pregnant      Passed - Valid encounter within last 12 months    Recent Outpatient Visits            4 weeks ago Hospital discharge follow-up   [facility], Raguel, MD   2 years ago Leg DVT (deep venous thromboembolism), chronic, left [facility])   [facility] [place]. [facility] Brien Belvie BRAVO, MD   2 years ago Candidemia [facility])   [facility] [place]. [facility] Brien Belvie BRAVO, MD   5 years ago PE (pulmonary thromboembolism) [facility])   [facility] [place]. [facility] Brien Belvie BRAVO, MD   5 years ago Acute deep vein thrombosis (DVT) of femoral vein of right lower extremity [facility])   [facility] [place]. [facility]  Brien Belvie BRAVO, MD       Future Appointments             In 1 week Luiz Channel, MD [facility] - [facility] Of [place]. [facility] [place]

## 2024-10-13 ENCOUNTER — Ambulatory Visit: Admitting: Internal Medicine

## 2024-10-19 ENCOUNTER — Ambulatory Visit: Admitting: Family Medicine

## 2024-11-01 ENCOUNTER — Other Ambulatory Visit

## 2024-11-01 ENCOUNTER — Inpatient Hospital Stay: Admission: RE | Admit: 2024-11-01 | Source: Ambulatory Visit

## 2024-11-04 ENCOUNTER — Ambulatory Visit: Admitting: Internal Medicine

## 2024-11-09 ENCOUNTER — Other Ambulatory Visit: Payer: Self-pay | Admitting: Internal Medicine

## 2024-11-09 ENCOUNTER — Other Ambulatory Visit: Payer: Self-pay | Admitting: Family Medicine

## 2024-11-11 ENCOUNTER — Other Ambulatory Visit: Payer: Self-pay | Admitting: Family Medicine

## 2024-11-11 NOTE — Telephone Encounter (Signed)
 Copied from CRM #[id number]. Topic: Clinical - Medication Refill >> Nov 11, 2024  4:30 PM Zebedee SAUNDERS wrote: Medication: rivaroxaban  (XARELTO ) 20 MG TABS tablet  Has the patient contacted their pharmacy? Yes (Agent: If no, request that the patient contact the pharmacy for the refill. If patient does not wish to contact the pharmacy document the reason why and proceed with request.) (Agent: If yes, when and what did the pharmacy advise?)  This is the patient's preferred pharmacy:  [id number] [place], [place] - [address] AT [facility] [address] [place] [address] Phone: [phone] Fax: [phone]   Is this the correct pharmacy for this prescription? Yes If no, delete pharmacy and type the correct one.   Has the prescription been filled recently? Yes  Is the patient out of the medication? Yes  Has the patient been seen for an appointment in the last year OR does the patient have an upcoming appointment? Yes  Can we respond through MyChart? Yes  Agent: Please be advised that Rx refills may take up to 3 business days. We ask that you follow-up with your pharmacy.

## 2024-11-13 NOTE — Telephone Encounter (Signed)
 Duplicate request  Requested Prescriptions  Pending Prescriptions Disp Refills   rivaroxaban  (XARELTO ) 20 MG TABS tablet 30 tablet 0    Sig: Take 1 tablet (20 mg total) by mouth daily with supper.     Hematology: Anticoagulants - rivaroxaban  Failed - 11/13/2024 10:48 AM      Failed - HCT in normal range and within 360 days    HCT  Date Value Ref Range Status  08/22/2024 31.9 (L) 36.0 - 46.0 % Final   Hematocrit  Date Value Ref Range Status  05/06/2022 36.1 34.0 - 46.6 % Final         Failed - HGB in normal range and within 360 days    Hemoglobin  Date Value Ref Range Status  08/22/2024 9.2 (L) 12.0 - 15.0 g/dL Final  94/84/7976 88.3 11.1 - 15.9 g/dL Final         Failed - PLT in normal range and within 360 days    Platelets  Date Value Ref Range Status  08/22/2024 608 (H) 150 - 400 K/uL Final  05/06/2022 609 (H) 150 - 450 x10E3/uL Final         Passed - ALT in normal range and within 360 days    ALT  Date Value Ref Range Status  08/22/2024 14 0 - 44 U/L Final         Passed - AST in normal range and within 360 days    AST  Date Value Ref Range Status  08/22/2024 17 15 - 41 U/L Final         Passed - Cr in normal range and within 360 days    Creat  Date Value Ref Range Status  05/09/2016 0.74 0.50 - 1.10 mg/dL Final   Creatinine, Ser  Date Value Ref Range Status  08/22/2024 1.00 0.44 - 1.00 mg/dL Final         Passed - eGFR is 15 or above and within 360 days    GFR calc Af Amer  Date Value Ref Range Status  04/15/2019 115 >59 mL/min/1.73 Final   GFR, Estimated  Date Value Ref Range Status  08/22/2024 >60 >60 mL/min Final    Comment:    (NOTE) Calculated using the CKD-EPI Creatinine Equation (2021)    GFR  Date Value Ref Range Status  01/25/2014 84.43 >60.00 mL/min Final   eGFR  Date Value Ref Range Status  05/06/2022 88 >59 mL/min/1.73 Final         Passed - Patient is not pregnant      Passed - Valid encounter within last 12 months     Recent Outpatient Visits           2 months ago Hospital discharge follow-up   [facility], [place], MD   2 years ago Leg DVT (deep venous thromboembolism), chronic, left [facility])   [facility] Dept Of [place]. [facility] Brien Belvie BRAVO, MD   2 years ago Candidemia [facility])   [facility] - A Dept Of [place]. [facility] Brien Belvie BRAVO, MD   5 years ago PE (pulmonary thromboembolism) [facility])   [facility] - A Dept Of [place]. [facility] Brien Belvie BRAVO, MD   5 years ago Acute deep vein thrombosis (DVT) of femoral vein of right lower extremity [facility])   [facility] Dept Of [place]. [facility]  [facility] Brien Belvie BRAVO, MD       Future Appointments             In 3 weeks Luiz Channel, MD [facility] - A Dept Of [place]. [facility], [place]

## 2024-11-17 ENCOUNTER — Ambulatory Visit
Admission: RE | Admit: 2024-11-17 | Discharge: 2024-11-17 | Disposition: A | Discharge status: Home / Self Care | Source: Ambulatory Visit | Attending: Internal Medicine | Admitting: Internal Medicine

## 2024-11-17 ENCOUNTER — Other Ambulatory Visit: Payer: Self-pay | Admitting: Internal Medicine

## 2024-11-17 DIAGNOSIS — K7689 Other specified diseases of liver: Secondary | ICD-10-CM

## 2024-11-17 MED ORDER — IOPAMIDOL (ISOVUE-300) INJECTION 61%
100.0000 mL | Freq: Once | INTRAVENOUS | Status: AC | PRN
  Administered 2024-11-17: 100 mL via INTRAVENOUS

## 2024-11-17 NOTE — Progress Notes (Signed)
   IR Note  Addendum:  Pt is to have a follow up appt at [facility] 2-3 weeks from now Re CT - hopefully week of Dec 22 ----After Dec 18 appt with Dr Luiz  If pt gets an earlier appt with Dr Luiz--- she could be scheduled at [facility] week of Dec 15- dependent on schedule.  Thanks

## 2024-11-17 NOTE — Progress Notes (Signed)
 Referring Physician(s): Vu,Trung T Dr JAYSON Bologna  Chief Complaint: The patient is seen in follow up today s/p subhepatic cyst- drain placed in IR 08/13/24  History of present illness:  Known subhepatic cyst since 2023 Initially had 7 Liters drained from cyst- no drain placed Recurrence 07/2024 with 5 liters aspirated: +Klebsiella Drain placed in IR 08/13/24 Remains on daily antibiotic per ID Last Clinic visit 09/29/24: told to return when output was less that 30 cc daily-- or in 1 month- whichever is first. Now returns for evaluation and drain management  OP now 25-35 cc daily Cloudy dark yellow Sore at site Denies fever;chills Painful when catheter moves around Pt is unable to keep any kind of dressing on area---- comes off very quickly regardless of her attempts to secure a dressing  Follows up with Dr Bologna Dec 09, 2024  Past Medical History:  Diagnosis Date   Abdominal pain due to right abdominal and pelvic cystic mass 04/16/2022   Allergy    Anxiety    Candidemia (HCC) 04/18/2022   Depression    DVT (deep venous thrombosis) (HCC)    GERD (gastroesophageal reflux disease)    Hepatic cyst    Hypertension    Lymphedema of both lower extremities    Migraines    Morbid obesity with BMI of 70 and over, adult [facility])    PE (pulmonary thromboembolism) (HCC) 03/2019   Pneumonia 04/09/2022   Sleep apnea     Past Surgical History:  Procedure Laterality Date   CHOLECYSTECTOMY     IR INFUSION THROMBOL ARTERIAL INITIAL (MS)  03/29/2019   IR INFUSION THROMBOL ARTERIAL INITIAL (MS)  03/29/2019   IR PATIENT EVAL TECH 0-60 MINS  08/16/2024   IR RADIOLOGIST EVAL & MGMT  08/31/2024   IR RADIOLOGIST EVAL & MGMT  09/29/2024   IR THROMB F/U EVAL ART/VEN FINAL DAY (MS)  03/30/2019   IR US  GUIDE BX ASP/DRAIN  08/12/2024   IR US  GUIDE VASC ACCESS RIGHT  03/29/2019   TEE WITHOUT CARDIOVERSION N/A 04/23/2022   Procedure: TRANSESOPHAGEAL ECHOCARDIOGRAM (TEE);  Surgeon: Francyne Headland, MD;  Location[facility]  ENDOSCOPY;  Service: Cardiovascular;  Laterality: N/A;    Allergies: Adhesive [tape]  Medications: Prior to Admission medications   Medication Sig Start Date End Date Taking? Authorizing Provider  acetaminophen  (TYLENOL ) 325 MG tablet Take 2 tablets (650 mg total) by mouth every 6 (six) hours as needed for mild pain (or Fever >/= 101). 04/11/22   Elgergawy, Brayton RAMAN, MD  amoxicillin -clavulanate (AUGMENTIN ) 875-125 MG tablet TAKE 1 TABLET BY MOUTH TWICE A DAY 11/09/24   Bologna Channel, MD  cyanocobalamin  ([employer] VITAMIN B12) 1000 MCG tablet Take 1 tablet (1,000 mcg total) by mouth daily. 01/28/23   Brien Belvie BRAVO, MD  cyanocobalamin  1000 MCG tablet Take 1 tablet (1,000 mcg total) by mouth daily. 08/19/24   Waymond Cart, MD  diclofenac  Sodium (VOLTAREN ) 1 % GEL Apply 4 g topically 4 (four) times daily. 08/19/24   Waymond Cart, MD  ondansetron  (ZOFRAN -ODT) 4 MG disintegrating tablet Take 1 tablet (4 mg total) by mouth every 8 (eight) hours as needed for nausea or vomiting. 08/19/24   Waymond Cart, MD  senna-docusate (SENOKOT-S) 8.6-50 MG tablet Take 1 tablet by mouth at bedtime as needed for mild constipation. 08/19/24   Waymond Cart, MD  sodium chloride  flush 0.9 % SOLN injection Flush 5-10mL of saline into catheter daily to prevent clogging 08/17/24   McInnis, Caitlyn M, PA-C  XARELTO  20 MG TABS tablet TAKE  1 TABLET BY MOUTH DAILY WITH SUPPER. 11/12/24   Tanda Bleacher, MD     Family History  Problem Relation Age of Onset   Hypertension Mother    Thyroid disease Mother    Hypertension Maternal Grandmother    Diabetes Maternal Grandmother    Breast cancer Maternal Grandmother    Stroke Maternal Grandmother    Heart attack Neg Hx     Social History   Socioeconomic History   Marital status: Widowed    Spouse name: Not on file   Number of children: Not on file   Years of education: 12   Highest education level: Associate degree: occupational, scientist, product/process development, or vocational program  Occupational  History   Occupation: Admin.    Employer: [employer]  Tobacco Use   Smoking status: Never   Smokeless tobacco: Never  Vaping Use   Vaping status: Never Used  Substance and Sexual Activity   Alcohol use: Yes    Comment: occ   Drug use: No   Sexual activity: Not Currently    Birth control/protection: I.U.D.  Other Topics Concern   Not on file  Social History Narrative   Married, 1 child   Social Drivers of Corporate Investment Banker Strain: Low Risk  (12/11/2023)   Overall Financial Resource Strain (CARDIA)    Difficulty of Paying Living Expenses: Not hard at all  Food Insecurity: Food Insecurity Present (12/11/2023)   Hunger Vital Sign    Worried About Running Out of Food in the Last Year: Never true    Ran Out of Food in the Last Year: Sometimes true  Transportation Needs: No Transportation Needs (12/11/2023)   PRAPARE - Administrator, Civil Service (Medical): No    Lack of Transportation (Non-Medical): No  Physical Activity: Insufficiently Active (12/11/2023)   Exercise Vital Sign    Days of Exercise per Week: 2 days    Minutes of Exercise per Session: 10 min  Stress: No Stress Concern Present (12/11/2023)   [employer] of Occupational Health - Occupational Stress Questionnaire    Feeling of Stress : Only a little  Social Connections: Unknown (12/11/2023)   Social Connection and Isolation Panel    Frequency of Communication with Friends and Family: More than three times a week    Frequency of Social Gatherings with Friends and Family: Once a week    Attends Religious Services: Patient declined    Database Administrator or Organizations: No    Attends Engineer, Structural: Not on file    Marital Status: Widowed     Vital Signs: There were no vitals taken for this visit.  Physical Exam Skin:    General: Skin is warm and dry.     Comments: Skin site is clean and dry Minimal redness at site; minimally sore to touch Hurts most  to move the catheter around  No sign of infection noted  Output in JP 30 cc dark yellow fluid     Imaging: No results found.  Labs:  CBC: Recent Labs    08/17/24 0411 08/18/24 0302 08/19/24 0610 08/22/24 0623  WBC 12.7* 12.9* 11.8* 10.2  HGB 8.8* 8.3* 8.4* 9.2*  HCT 29.9* 28.2* 28.1* 31.9*  PLT 539* 507* 543* 608*    COAGS: Recent Labs    08/14/24 0338 08/15/24 0334 08/16/24 0353 08/17/24 0411  INR 3.4* 3.0* 2.8* 3.2*    BMP: Recent Labs    08/17/24 0411 08/18/24 0302 08/19/24 0610 08/22/24 9376  NA  131* 132* 133* 135  K 4.5 4.6 4.4 3.9  CL 96* 96* 96* 98  CO2 25 26 26 24   GLUCOSE 228* 183* 207* 171*  BUN 9 8 9 14   CALCIUM 8.2* 8.1* 8.3* 8.8*  CREATININE 0.62 0.64 0.73 1.00  GFRNONAA >60 >60 >60 >60    LIVER FUNCTION TESTS: Recent Labs    08/16/24 0353 08/18/24 0302 08/19/24 0610 08/22/24 0623  BILITOT 0.7 0.4 0.4 0.4  AST 29 18 15 17   ALT 31 19 15 14   ALKPHOS 135* 117 101 91  PROT 7.4 6.8 7.1 7.4  ALBUMIN 1.7* 1.6* 1.5* 1.9*    Assessment:  Subhepatic infected cyst: +klebsiella 08/13/24 Drain intact Output slowing - now 30 - 35 cc daily Follows with Dr JAYSON Bologna - next visit 12/09/24 Remains on antibiotic per ID Discussed with Dr Jenna CT today showing continued improvement in collection. Pt wants to consider sclerotherapy for cyst.  Dr Jenna recommending resolution of this infected cyst first; finish antibiotic course Consider sclerotherapy if recurrence and non infected. Pt is aware and agreeable to this plan   Signed: Sharlet DELENA Candle, PA-C 11/17/2024, 1:42 PM   Please refer to Dr. Jenna attestation of this note for management and plan.

## 2024-11-28 ENCOUNTER — Emergency Department (HOSPITAL_COMMUNITY)

## 2024-11-28 ENCOUNTER — Inpatient Hospital Stay (HOSPITAL_COMMUNITY)
Admission: EM | Admit: 2024-11-28 | Discharge: 2024-12-05 | DRG: 312 | Disposition: A | Attending: Family Medicine | Admitting: Family Medicine

## 2024-11-28 DIAGNOSIS — I10 Essential (primary) hypertension: Secondary | ICD-10-CM | POA: Diagnosis present

## 2024-11-28 DIAGNOSIS — D649 Anemia, unspecified: Secondary | ICD-10-CM | POA: Diagnosis present

## 2024-11-28 DIAGNOSIS — R55 Syncope and collapse: Secondary | ICD-10-CM | POA: Diagnosis not present

## 2024-11-28 DIAGNOSIS — D72829 Elevated white blood cell count, unspecified: Secondary | ICD-10-CM | POA: Diagnosis present

## 2024-11-28 DIAGNOSIS — Z6841 Body Mass Index (BMI) 40.0 and over, adult: Secondary | ICD-10-CM | POA: Diagnosis not present

## 2024-11-28 DIAGNOSIS — R162 Hepatomegaly with splenomegaly, not elsewhere classified: Secondary | ICD-10-CM | POA: Diagnosis present

## 2024-11-28 DIAGNOSIS — Z8249 Family history of ischemic heart disease and other diseases of the circulatory system: Secondary | ICD-10-CM | POA: Diagnosis not present

## 2024-11-28 DIAGNOSIS — E860 Dehydration: Secondary | ICD-10-CM | POA: Diagnosis present

## 2024-11-28 DIAGNOSIS — R3129 Other microscopic hematuria: Secondary | ICD-10-CM | POA: Diagnosis present

## 2024-11-28 DIAGNOSIS — G8929 Other chronic pain: Secondary | ICD-10-CM | POA: Diagnosis present

## 2024-11-28 DIAGNOSIS — G4733 Obstructive sleep apnea (adult) (pediatric): Secondary | ICD-10-CM | POA: Diagnosis present

## 2024-11-28 DIAGNOSIS — N390 Urinary tract infection, site not specified: Secondary | ICD-10-CM | POA: Diagnosis not present

## 2024-11-28 DIAGNOSIS — I951 Orthostatic hypotension: Secondary | ICD-10-CM | POA: Diagnosis present

## 2024-11-28 DIAGNOSIS — K219 Gastro-esophageal reflux disease without esophagitis: Secondary | ICD-10-CM | POA: Diagnosis present

## 2024-11-28 DIAGNOSIS — Z7901 Long term (current) use of anticoagulants: Secondary | ICD-10-CM | POA: Diagnosis not present

## 2024-11-28 DIAGNOSIS — K750 Abscess of liver: Secondary | ICD-10-CM | POA: Diagnosis present

## 2024-11-28 DIAGNOSIS — K7460 Unspecified cirrhosis of liver: Secondary | ICD-10-CM | POA: Diagnosis present

## 2024-11-28 DIAGNOSIS — Z833 Family history of diabetes mellitus: Secondary | ICD-10-CM | POA: Diagnosis not present

## 2024-11-28 DIAGNOSIS — Z86718 Personal history of other venous thrombosis and embolism: Secondary | ICD-10-CM | POA: Diagnosis not present

## 2024-11-28 DIAGNOSIS — E1165 Type 2 diabetes mellitus with hyperglycemia: Secondary | ICD-10-CM | POA: Diagnosis present

## 2024-11-28 DIAGNOSIS — E872 Acidosis, unspecified: Secondary | ICD-10-CM | POA: Diagnosis present

## 2024-11-28 DIAGNOSIS — Z86711 Personal history of pulmonary embolism: Secondary | ICD-10-CM | POA: Diagnosis not present

## 2024-11-28 DIAGNOSIS — R8281 Pyuria: Secondary | ICD-10-CM | POA: Diagnosis present

## 2024-11-28 LAB — URINALYSIS, ROUTINE W REFLEX MICROSCOPIC
Bilirubin Urine: NEGATIVE
Glucose, UA: NEGATIVE mg/dL
Ketones, ur: NEGATIVE mg/dL
Nitrite: NEGATIVE
Protein, ur: 100 mg/dL — AB
RBC / HPF: >50 RBC/hpf (ref 0–5)
Specific Gravity, Urine: >1.046 — ABNORMAL HIGH (ref 1.005–1.030)
WBC, UA: >50 WBC/hpf (ref 0–5)
pH: 6 (ref 5.0–8.0)

## 2024-11-28 LAB — I-STAT CG4 LACTIC ACID, ED
Lactic Acid, Venous: 2.7 mmol/L (ref 0.5–1.9)
Lactic Acid, Venous: 3 mmol/L (ref 0.5–1.9)

## 2024-11-28 LAB — CBC WITH DIFFERENTIAL/PLATELET
Abs Immature Granulocytes: 0.06 K/uL (ref 0.00–0.07)
Basophils Absolute: 0 K/uL (ref 0.0–0.1)
Basophils Relative: 0 %
Eosinophils Absolute: 0.1 K/uL (ref 0.0–0.5)
Eosinophils Relative: 1 %
HCT: 32.4 % — ABNORMAL LOW (ref 36.0–46.0)
Hemoglobin: 10.1 g/dL — ABNORMAL LOW (ref 12.0–15.0)
Immature Granulocytes: 1 %
Lymphocytes Relative: 12 %
Lymphs Abs: 1.5 K/uL (ref 0.7–4.0)
MCH: 25.6 pg — ABNORMAL LOW (ref 26.0–34.0)
MCHC: 31.2 g/dL (ref 30.0–36.0)
MCV: 82.2 fL (ref 80.0–100.0)
Monocytes Absolute: 0.7 K/uL (ref 0.1–1.0)
Monocytes Relative: 6 %
Neutro Abs: 9.5 K/uL — ABNORMAL HIGH (ref 1.7–7.7)
Neutrophils Relative %: 80 %
Platelets: ADEQUATE K/uL (ref 150–400)
RBC: 3.94 MIL/uL (ref 3.87–5.11)
RDW: 14.9 % (ref 11.5–15.5)
WBC: 11.8 K/uL — ABNORMAL HIGH (ref 4.0–10.5)
nRBC: 0 % (ref 0.0–0.2)

## 2024-11-28 LAB — COMPREHENSIVE METABOLIC PANEL WITH GFR
ALT: 11 U/L (ref 0–44)
AST: 19 U/L (ref 15–41)
Albumin: 2.4 g/dL — ABNORMAL LOW (ref 3.5–5.0)
Alkaline Phosphatase: 63 U/L (ref 38–126)
Anion gap: 8 (ref 5–15)
BUN: 16 mg/dL (ref 6–20)
CO2: 20 mmol/L — ABNORMAL LOW (ref 22–32)
Calcium: 7.2 mg/dL — ABNORMAL LOW (ref 8.9–10.3)
Chloride: 110 mmol/L (ref 98–111)
Creatinine, Ser: 0.81 mg/dL (ref 0.44–1.00)
GFR, Estimated: >60 mL/min (ref >60)
Glucose, Bld: 130 mg/dL — ABNORMAL HIGH (ref 70–99)
Potassium: 3.6 mmol/L (ref 3.5–5.1)
Sodium: 138 mmol/L (ref 135–145)
Total Bilirubin: 0.9 mg/dL (ref 0.0–1.2)
Total Protein: 6.6 g/dL (ref 6.5–8.1)

## 2024-11-28 LAB — BRAIN NATRIURETIC PEPTIDE: B Natriuretic Peptide: 34.7 pg/mL (ref 0.0–100.0)

## 2024-11-28 LAB — PROCALCITONIN: Procalcitonin: <0.1 ng/mL

## 2024-11-28 LAB — HEMOGLOBIN A1C
Hgb A1c MFr Bld: 6.8 % — ABNORMAL HIGH (ref 4.8–5.6)
Mean Plasma Glucose: 148.46 mg/dL

## 2024-11-28 LAB — TSH: TSH: 1.935 u[IU]/mL (ref 0.350–4.500)

## 2024-11-28 LAB — CBG MONITORING, ED: Glucose-Capillary: 138 mg/dL — ABNORMAL HIGH (ref 70–99)

## 2024-11-28 LAB — TROPONIN I (HIGH SENSITIVITY): Troponin I (High Sensitivity): 6 ng/L (ref <18)

## 2024-11-28 LAB — SEDIMENTATION RATE: Sed Rate: 76 mm/h — ABNORMAL HIGH (ref 0–22)

## 2024-11-28 LAB — C-REACTIVE PROTEIN: CRP: 3.9 mg/dL — ABNORMAL HIGH (ref <1.0)

## 2024-11-28 LAB — MAGNESIUM: Magnesium: 1.5 mg/dL — ABNORMAL LOW (ref 1.7–2.4)

## 2024-11-28 LAB — HCG, SERUM, QUALITATIVE: Preg, Serum: NEGATIVE

## 2024-11-28 MED ORDER — RIVAROXABAN 20 MG PO TABS
20.0000 mg | ORAL_TABLET | Freq: Every day | ORAL | Status: DC
  Administered 2024-11-28 – 2024-12-04 (×7): 20 mg via ORAL
  Filled 2024-11-28 (×4): qty 1
  Filled 2024-11-28: qty 2
  Filled 2024-11-28 (×2): qty 1

## 2024-11-28 MED ORDER — ACETAMINOPHEN 325 MG PO TABS
650.0000 mg | ORAL_TABLET | Freq: Four times a day (QID) | ORAL | Status: DC | PRN
  Administered 2024-11-29 – 2024-12-03 (×9): 650 mg via ORAL
  Filled 2024-11-28 (×10): qty 2

## 2024-11-28 MED ORDER — CALCIUM GLUCONATE-NACL 1-0.675 GM/50ML-% IV SOLN
1.0000 g | Freq: Once | INTRAVENOUS | Status: AC
  Administered 2024-11-28: 1000 mg via INTRAVENOUS
  Filled 2024-11-28: qty 50

## 2024-11-28 MED ORDER — SODIUM CHLORIDE 0.9 % IV BOLUS
1000.0000 mL | Freq: Once | INTRAVENOUS | Status: AC
  Administered 2024-11-28: 1000 mL via INTRAVENOUS

## 2024-11-28 MED ORDER — ONDANSETRON HCL 4 MG/2ML IJ SOLN: 4.0000 mg | Freq: Four times a day (QID) | INTRAMUSCULAR | Status: DC | PRN

## 2024-11-28 MED ORDER — ACETAMINOPHEN 650 MG RE SUPP: 650.0000 mg | Freq: Four times a day (QID) | RECTAL | Status: DC | PRN

## 2024-11-28 MED ORDER — SODIUM CHLORIDE 0.9 % IV SOLN
1.0000 g | INTRAVENOUS | Status: DC
  Administered 2024-11-28: 1 g via INTRAVENOUS
  Filled 2024-11-28: qty 10

## 2024-11-28 MED ORDER — SODIUM CHLORIDE 0.9 % IV SOLN: INTRAVENOUS | Status: DC

## 2024-11-28 MED ORDER — ONDANSETRON HCL 4 MG PO TABS: 4.0000 mg | ORAL_TABLET | Freq: Four times a day (QID) | ORAL | Status: DC | PRN

## 2024-11-28 MED ORDER — IOHEXOL 350 MG/ML SOLN
100.0000 mL | Freq: Once | INTRAVENOUS | Status: AC | PRN
  Administered 2024-11-28: 100 mL via INTRAVENOUS

## 2024-11-28 NOTE — ED Triage Notes (Addendum)
 BIB [facility] from home. Syncopal episodes. Starting in Sept 2025, progressively getting worse.  On Xarelto . BP Fluctuating 70/40 & 130s systolic. HR 110s NSR. 224 CBG 100% RA

## 2024-11-28 NOTE — ED Provider Notes (Signed)
 [place] EMERGENCY DEPARTMENT AT [facility] Provider Note   CSN: [id number] Arrival date & time: 11/28/24  8361     Patient presents with: Loss of Consciousness   Susan Warren is a 48 y.o. female.   Pt is a 48 yo female with pmhx significant for morbid obesity, htn, hx vtach, depression, gerd, hx PE (on Xarelto ), and hepatic abscesses/cysts.  She still has a drain in place, but has very little output.  Pt remains on Augmentin .  She has been having dizzy spells and labile blood pressure since September.  She had a syncopal event this afternoon while she was on the toilet.  She did not injure herself.  She denies any bleeding.  No n/v/d.       Prior to Admission medications   Medication Sig Start Date End Date Taking? Authorizing Provider  acetaminophen  (TYLENOL ) 325 MG tablet Take 2 tablets (650 mg total) by mouth every 6 (six) hours as needed for mild pain (or Fever >/= 101). 04/11/22   Elgergawy, Brayton RAMAN, MD  amoxicillin -clavulanate (AUGMENTIN ) 875-125 MG tablet TAKE 1 TABLET BY MOUTH TWICE A DAY 11/09/24   Luiz Channel, MD  cyanocobalamin  ([employer] VITAMIN B12) 1000 MCG tablet Take 1 tablet (1,000 mcg total) by mouth daily. 01/28/23   Brien Belvie BRAVO, MD  cyanocobalamin  1000 MCG tablet Take 1 tablet (1,000 mcg total) by mouth daily. 08/19/24   Waymond Cart, MD  diclofenac  Sodium (VOLTAREN ) 1 % GEL Apply 4 g topically 4 (four) times daily. 08/19/24   Waymond Cart, MD  ondansetron  (ZOFRAN -ODT) 4 MG disintegrating tablet Take 1 tablet (4 mg total) by mouth every 8 (eight) hours as needed for nausea or vomiting. 08/19/24   Waymond Cart, MD  senna-docusate (SENOKOT-S) 8.6-50 MG tablet Take 1 tablet by mouth at bedtime as needed for mild constipation. 08/19/24   Waymond Cart, MD  sodium chloride  flush 0.9 % SOLN injection Flush 5-10mL of saline into catheter daily to prevent clogging 08/17/24   McInnis, Caitlyn M, PA-C  XARELTO  20 MG TABS tablet TAKE 1 TABLET BY MOUTH DAILY WITH  SUPPER. 11/12/24   Tanda Bleacher, MD    Allergies: Adhesive [tape]    Review of Systems  Neurological:  Positive for dizziness and syncope.  All other systems reviewed and are negative.   Updated Vital Signs BP (!) 142/120   Pulse (!) 114   Temp 98 F (36.7 C) (Oral)   Resp 20   SpO2 100%   Physical Exam Vitals and nursing note reviewed.  Constitutional:      Appearance: Normal appearance. She is obese.  HENT:     Head: Normocephalic and atraumatic.     Right Ear: External ear normal.     Left Ear: External ear normal.     Nose: Nose normal.     Mouth/Throat:     Mouth: Mucous membranes are dry.  Eyes:     Extraocular Movements: Extraocular movements intact.     Conjunctiva/sclera: Conjunctivae normal.     Pupils: Pupils are equal, round, and reactive to light.  Cardiovascular:     Rate and Rhythm: Normal rate and regular rhythm.     Pulses: Normal pulses.     Heart sounds: Normal heart sounds.  Pulmonary:     Effort: Pulmonary effort is normal.     Breath sounds: Normal breath sounds.  Abdominal:     General: Bowel sounds are normal.     Palpations: Abdomen is soft.  Musculoskeletal:  General: Normal range of motion.     Cervical back: Normal range of motion and neck supple.  Skin:    General: Skin is warm.     Capillary Refill: Capillary refill takes less than 2 seconds.     Comments: Bilateral LE chronic lymphedema and skin changes  Neurological:     General: No focal deficit present.     Mental Status: She is alert and oriented to person, place, and time.  Psychiatric:        Mood and Affect: Mood normal.        Behavior: Behavior normal.     (all labs ordered are listed, but only abnormal results are displayed) Labs Reviewed  COMPREHENSIVE METABOLIC PANEL WITH GFR - Abnormal; Notable for the following components:      Result Value   CO2 20 (*)    Glucose, Bld 130 (*)    Calcium  7.2 (*)    Albumin 2.4 (*)    All other components within  normal limits  CBC WITH DIFFERENTIAL/PLATELET - Abnormal; Notable for the following components:   WBC 11.8 (*)    Hemoglobin 10.1 (*)    HCT 32.4 (*)    MCH 25.6 (*)    Neutro Abs 9.5 (*)    All other components within normal limits  CBG MONITORING, ED - Abnormal; Notable for the following components:   Glucose-Capillary 138 (*)    All other components within normal limits  I-STAT CG4 LACTIC ACID, ED - Abnormal; Notable for the following components:   Lactic Acid, Venous 2.7 (*)    All other components within normal limits  URINALYSIS, ROUTINE W REFLEX MICROSCOPIC  HCG, SERUM, QUALITATIVE  I-STAT CG4 LACTIC ACID, ED    EKG: EKG Interpretation Date/Time:  Sunday November 28 2024 16:52:22 EST Ventricular Rate:  110 PR Interval:  145 QRS Duration:  85 QT Interval:  354 QTC Calculation: 479 R Axis:   54  Text Interpretation: Sinus tachycardia Since last tracing rate faster Confirmed by Dean Clarity [id number]) on 11/28/2024 8:05:42 PM  Radiology: CT ABDOMEN PELVIS W CONTRAST Result Date: 11/28/2024 EXAM: CT ABDOMEN AND PELVIS WITH CONTRAST 11/28/2024 08:30:00 PM TECHNIQUE: CT of the abdomen and pelvis was performed with the administration of 100 mL of iohexol  (OMNIPAQUE ) 350 MG/ML injection. Multiplanar reformatted images are provided for review. Automated exposure control, iterative reconstruction, and/or weight-based adjustment of the mA/kV was utilized to reduce the radiation dose to as low as reasonably achievable. COMPARISON: 11/17/2024, 08/22/2024, 04/16/2022. CLINICAL HISTORY: Abdominal pain, acute, nonlocalized; hx hepatic abscess. FINDINGS: LOWER CHEST: Mitral annular calcification. LIVER: The liver is enlarged, measuring up to 21 cm. Redemonstration of the grossly stable multiloculated partially calcified chronic 13 x 7 cm right inferior hepatic lobe subhepatic gas and fluid complex lesion. Findings abuts the hepatic flexure. A surgical drain with a pigtail within the collection  is again noted. GALLBLADDER AND BILE DUCTS: Gallbladder is unremarkable. No biliary ductal dilatation. SPLEEN: The spleen is enlarged, measuring up to 14 cm. PANCREAS: No acute abnormality. ADRENAL GLANDS: No acute abnormality. KIDNEYS, URETERS AND BLADDER: No stones in the kidneys or ureters. No hydronephrosis. No perinephric or periureteral stranding. No filling defects of the partially visualized collecting systems on delayed imaging. Urinary bladder is unremarkable. GI AND BOWEL: Stomach demonstrates no acute abnormality. No small or large bowel thickening or dilatation. The appendix is unremarkable. Stool throughout the ascending colon and proximal to mid transverse colon. There is no bowel obstruction. PERITONEUM AND RETROPERITONEUM: No ascites. No free  air. VASCULATURE: Aorta is normal in caliber. Mild atherosclerotic plaque. LYMPH NODES: No lymphadenopathy. REPRODUCTIVE ORGANS: The uterus and bilateral adnexal regions are unremarkable. BONES AND SOFT TISSUES: No acute osseous abnormality. No focal soft tissue abnormality. IMPRESSION: 1. Grossly stable multiloculated partially calcified chronic 13 x 7 cm right inferior hepatic lobe subhepatic gas and fluid collection with an indwelling surgical drain. Collection abuts the hepatic flexure. 2. Hepatosplenomegaly. Electronically signed by: Morgane Naveau MD 11/28/2024 09:07 PM EST RP Workstation: [id number]   DG Chest Port 1 View Result Date: 11/28/2024 CLINICAL DATA:  Syncope EXAM: PORTABLE CHEST 1 VIEW COMPARISON:  08/12/2024 FINDINGS: The heart size and mediastinal contours are within normal limits. Both lungs are clear. The visualized skeletal structures are unremarkable. IMPRESSION: No active disease. Electronically Signed   By: Ozell Daring M.D.   On: 11/28/2024 18:35     Procedures   Medications Ordered in the ED  calcium  gluconate 1 g/ 50 mL sodium chloride  IVPB (has no administration in time range)  sodium chloride  0.9 % bolus 1,000 mL (0  mLs Intravenous Stopped 11/28/24 2049)  iohexol  (OMNIPAQUE ) 350 MG/ML injection 100 mL (100 mLs Intravenous Contrast Given 11/28/24 2053)                                    Medical Decision Making Amount and/or Complexity of Data Reviewed Labs: ordered. Radiology: ordered.  Risk Prescription drug management. Decision regarding hospitalization.   This patient presents to the ED for concern of syncope, this involves an extensive number of treatment options, and is a complaint that carries with it a high risk of complications and morbidity.  The differential diagnosis includes hypotension, anemia, electrolyte abn, infection   Co morbidities that complicate the patient evaluation  morbid obesity, htn, depression, gerd, hx PE (on Xarelto ), and hepatic abscesses/cysts   Additional history obtained:  Additional history obtained from epic chart review External records from outside source obtained and reviewed including EMS report   Lab Tests:  I Ordered, and personally interpreted labs.  The pertinent results include:   CBC with hgb 10.1 (9.2 on 8/31); cmp nl other than calcium  low at 7.2; lactic elevated at 2.7   Imaging Studies ordered:  I ordered imaging studies including cxr, ct abd/pelvis  I independently visualized and interpreted imaging which showed  CXR: No active disease.  CT abd/pelvis:  Grossly stable multiloculated partially calcified chronic 13 x 7 cm right  inferior hepatic lobe subhepatic gas and fluid collection with an indwelling  surgical drain. Collection abuts the hepatic flexure.  2. Hepatosplenomegaly.   I agree with the radiologist interpretation   Cardiac Monitoring:  The patient was maintained on a cardiac monitor.  I personally viewed and interpreted the cardiac monitored which showed an underlying rhythm of: nsr   Medicines ordered and prescription drug management:  I ordered medication including ivfs  for sx  Reevaluation of the patient  after these medicines showed that the patient improved I have reviewed the patients home medicines and have made adjustments as needed   Test Considered:  ct   Critical Interventions:  ivfs   Consultations Obtained:  I requested consultation with medicine,  and discussed lab and imaging findings as well as pertinent plan - they will admit   Problem List / ED Course:  Syncope:  likely due to hypotension.  Pt's lactic elevated, but she is afebrile.  Liver loculation is stable  BP  has been ok, but her HR shoots up to the 130s just with sitting.  She still feels dizzy.  She is only able to stand for a few seconds. Hypocalcemia:  ca given to pt   Reevaluation:  After the interventions noted above, I reevaluated the patient and found that they have :improved   Social Determinants of Health:  Lives at home   Dispostion:  After consideration of the diagnostic results and the patients response to treatment, I feel that the patent would benefit from admission.       Final diagnoses:  Syncope, unspecified syncope type  Hypocalcemia  Orthostatic hypotension    ED Discharge Orders     None          Dean Clarity, MD 11/28/24 2150

## 2024-11-28 NOTE — H&P (Signed)
 History and Physical    Susan Warren FMW:[id number] DOB: 1976/06/05 DOA: 11/28/2024  PCP: Tanda Bleacher, MD  Patient coming from: home  I have personally briefly reviewed patient's old medical records in [facility]  Chief Complaint: presyncope rona  HPI: Susan Warren is a 48 y.o. female with medical history significant of GERD,DVT/PE on xarelto , HTN, lymphedema of b/l lower extremities, OSA, anxiety , migraines  , hepatic abscess s/p drain insertion, who presents to ED with complaint of syncope BIBEMS. Per patient states she has had syncope episodes intermittent over the last 2 months but it has gotten more severe and frequent with palpitations over the last  few days.She notes symptoms affecting ADLS as on standing she feels presyncopal. She notes with episodes her blood pressure has been low at home. She states today while using the bathroom she had an episode of syncope. Patient notes no associated n/v/d/abdominal pain, fever/chills / sob /or chest pain . She does have history of hepatic abscess for which she continue on oral antibiotics. Patient states drain has minimum output at this time.    ED Course:  EKG: Nsr sinus tachycardia  Afeb , bp 137/91, hr 112-138 sat 90%  Wbc 11.8, hgb 10.1,  left shift  Na 138, K 3.6, Cl 110, glu 130, cr 0.81  UA: dirty , repeat pending  CXR:NAD  Lactic 2.7,3  A1c 6.8 Mag 1.5 ESR76 CTAB/pelvis MPRESSION: 1. Grossly stable multiloculated partially calcified chronic 13 x 7 cm right inferior hepatic lobe subhepatic gas and fluid collection with an indwelling surgical drain. Collection abuts the hepatic flexure. 2. Hepatosplenomegaly.  Tx NS 2L, ctx, robaxin  Review of Systems: As per HPI otherwise 10 point review of systems negative.   Past Medical History:  Diagnosis Date   Abdominal pain due to right abdominal and pelvic cystic mass 04/16/2022   Allergy    Anxiety    Candidemia (HCC) 04/18/2022   Depression    DVT  (deep venous thrombosis) (HCC)    GERD (gastroesophageal reflux disease)    Hepatic cyst    Hypertension    Lymphedema of both lower extremities    Migraines    Morbid obesity with BMI of 70 and over, adult [facility])    PE (pulmonary thromboembolism) (HCC) 03/2019   Pneumonia 04/09/2022   Sleep apnea     Past Surgical History:  Procedure Laterality Date   CHOLECYSTECTOMY     IR INFUSION THROMBOL ARTERIAL INITIAL (MS)  03/29/2019   IR INFUSION THROMBOL ARTERIAL INITIAL (MS)  03/29/2019   IR PATIENT EVAL TECH 0-60 MINS  08/16/2024   IR RADIOLOGIST EVAL & MGMT  08/31/2024   IR RADIOLOGIST EVAL & MGMT  09/29/2024   IR THROMB F/U EVAL ART/VEN FINAL DAY (MS)  03/30/2019   IR US  GUIDE BX ASP/DRAIN  08/12/2024   IR US  GUIDE VASC ACCESS RIGHT  03/29/2019   TEE WITHOUT CARDIOVERSION N/A 04/23/2022   Procedure: TRANSESOPHAGEAL ECHOCARDIOGRAM (TEE);  Surgeon: Francyne Headland, MD;  Location: [facility] ENDOSCOPY;  Service: Cardiovascular;  Laterality: N/A;     reports that she has never smoked. She has never used smokeless tobacco. She reports current alcohol use. She reports that she does not use drugs.  Allergies  Allergen Reactions   Adhesive [Tape] Anaphylaxis and Swelling    *Adhesive Spray*     Family History  Problem Relation Age of Onset   Hypertension Mother    Thyroid disease Mother    Hypertension Maternal Grandmother    Diabetes  Maternal Grandmother    Breast cancer Maternal Grandmother    Stroke Maternal Grandmother    Heart attack Neg Hx     Prior to Admission medications   Medication Sig Start Date End Date Taking? Authorizing Provider  acetaminophen  (TYLENOL ) 325 MG tablet Take 2 tablets (650 mg total) by mouth every 6 (six) hours as needed for mild pain (or Fever >/= 101). 04/11/22   Elgergawy, Brayton RAMAN, MD  amoxicillin -clavulanate (AUGMENTIN ) 875-125 MG tablet TAKE 1 TABLET BY MOUTH TWICE A DAY 11/09/24   Luiz Channel, MD  cyanocobalamin  ([employer] VITAMIN B12) 1000 MCG tablet Take 1 tablet  (1,000 mcg total) by mouth daily. 01/28/23   Brien Belvie BRAVO, MD  cyanocobalamin  1000 MCG tablet Take 1 tablet (1,000 mcg total) by mouth daily. 08/19/24   Waymond Cart, MD  diclofenac  Sodium (VOLTAREN ) 1 % GEL Apply 4 g topically 4 (four) times daily. 08/19/24   Waymond Cart, MD  ondansetron  (ZOFRAN -ODT) 4 MG disintegrating tablet Take 1 tablet (4 mg total) by mouth every 8 (eight) hours as needed for nausea or vomiting. 08/19/24   Waymond Cart, MD  senna-docusate (SENOKOT-S) 8.6-50 MG tablet Take 1 tablet by mouth at bedtime as needed for mild constipation. 08/19/24   Waymond Cart, MD  sodium chloride  flush 0.9 % SOLN injection Flush 5-10mL of saline into catheter daily to prevent clogging 08/17/24   McInnis, Caitlyn M, PA-C  XARELTO  20 MG TABS tablet TAKE 1 TABLET BY MOUTH DAILY WITH SUPPER. 11/12/24   Tanda Bleacher, MD    Physical Exam: Vitals:   11/28/24 1930 11/28/24 1945 11/28/24 2115 11/28/24 2133  BP: 124/80  (!) 142/120   Pulse: 94  (!) 114   Resp:  16 20   Temp:    98 F (36.7 C)  TempSrc:    Oral  SpO2: 100%  100%     Constitutional: NAD, calm, comfortable,obese Vitals:   11/28/24 1930 11/28/24 1945 11/28/24 2115 11/28/24 2133  BP: 124/80  (!) 142/120   Pulse: 94  (!) 114   Resp:  16 20   Temp:    98 F (36.7 C)  TempSrc:    Oral  SpO2: 100%  100%    Eyes: PERRL, lids and conjunctivae normal ENMT: Mucous membranes are moist. Posterior pharynx clear of any exudate or lesions.Normal dentition.  Neck: normal, supple, no masses, no thyromegaly Respiratory: clear to auscultation bilaterally, no wheezing, no crackles. Normal respiratory effort. No accessory muscle use.  Cardiovascular: Regular rate and rhythm, no murmurs / rubs / gallops. No extremity edema. 2+ pedal pulses. Abdomen: no tenderness, no masses palpated. No hepatosplenomegaly. Bowel sounds positive.  Musculoskeletal: no clubbing / cyanosis. No joint deformity upper and lower extremities. Good ROM, no contractures.  Normal muscle tone.  Skin: no rashes, lesions, ulcers. No induration Neurologic: CN 2-12 grossly intact. Sensation intact, MAEX  4.  Psychiatric: Normal judgment and insight. Alert and oriented x 3. Normal mood.    Labs on Admission: I have personally reviewed following labs and imaging studies  CBC: Recent Labs  Lab 11/28/24 1817  WBC 11.8*  NEUTROABS 9.5*  HGB 10.1*  HCT 32.4*  MCV 82.2  PLT PLATELET CLUMPS NOTED ON SMEAR, COUNT APPEARS ADEQUATE   Basic Metabolic Panel: Recent Labs  Lab 11/28/24 1817  NA 138  K 3.6  CL 110  CO2 20*  GLUCOSE 130*  BUN 16  CREATININE 0.81  CALCIUM  7.2*   GFR: CrCl cannot be calculated (Unknown ideal weight.). Liver Function Tests:  Recent Labs  Lab 11/28/24 1817  AST 19  ALT 11  ALKPHOS 63  BILITOT 0.9  PROT 6.6  ALBUMIN 2.4*   No results for input(s): LIPASE, AMYLASE in the last 168 hours. No results for input(s): AMMONIA in the last 168 hours. Coagulation Profile: No results for input(s): INR, PROTIME in the last 168 hours. Cardiac Enzymes: No results for input(s): CKTOTAL, CKMB, CKMBINDEX, TROPONINI in the last 168 hours. BNP (last 3 results) No results for input(s): PROBNP in the last 8760 hours. HbA1C: No results for input(s): HGBA1C in the last 72 hours. CBG: Recent Labs  Lab 11/28/24 1958  GLUCAP 138*   Lipid Profile: No results for input(s): CHOL, HDL, LDLCALC, TRIG, CHOLHDL, LDLDIRECT in the last 72 hours. Thyroid Function Tests: No results for input(s): TSH, T4TOTAL, FREET4, T3FREE, THYROIDAB in the last 72 hours. Anemia Panel: No results for input(s): VITAMINB12, FOLATE, FERRITIN, TIBC, IRON, RETICCTPCT in the last 72 hours. Urine analysis:    Component Value Date/Time   COLORURINE YELLOW 08/14/2024 1220   APPEARANCEUR TURBID (A) 08/14/2024 1220   LABSPEC 1.023 08/14/2024 1220   PHURINE 5.0 08/14/2024 1220   GLUCOSEU NEGATIVE 08/14/2024 1220    GLUCOSEU NEGATIVE 01/25/2014 1049   HGBUR LARGE (A) 08/14/2024 1220   BILIRUBINUR NEGATIVE 08/14/2024 1220   KETONESUR NEGATIVE 08/14/2024 1220   PROTEINUR NEGATIVE 08/14/2024 1220   UROBILINOGEN 0.2 01/25/2014 1049   NITRITE NEGATIVE 08/14/2024 1220   LEUKOCYTESUR NEGATIVE 08/14/2024 1220    Radiological Exams on Admission: CT ABDOMEN PELVIS W CONTRAST Result Date: 11/28/2024 EXAM: CT ABDOMEN AND PELVIS WITH CONTRAST 11/28/2024 08:30:00 PM TECHNIQUE: CT of the abdomen and pelvis was performed with the administration of 100 mL of iohexol  (OMNIPAQUE ) 350 MG/ML injection. Multiplanar reformatted images are provided for review. Automated exposure control, iterative reconstruction, and/or weight-based adjustment of the mA/kV was utilized to reduce the radiation dose to as low as reasonably achievable. COMPARISON: 11/17/2024, 08/22/2024, 04/16/2022. CLINICAL HISTORY: Abdominal pain, acute, nonlocalized; hx hepatic abscess. FINDINGS: LOWER CHEST: Mitral annular calcification. LIVER: The liver is enlarged, measuring up to 21 cm. Redemonstration of the grossly stable multiloculated partially calcified chronic 13 x 7 cm right inferior hepatic lobe subhepatic gas and fluid complex lesion. Findings abuts the hepatic flexure. A surgical drain with a pigtail within the collection is again noted. GALLBLADDER AND BILE DUCTS: Gallbladder is unremarkable. No biliary ductal dilatation. SPLEEN: The spleen is enlarged, measuring up to 14 cm. PANCREAS: No acute abnormality. ADRENAL GLANDS: No acute abnormality. KIDNEYS, URETERS AND BLADDER: No stones in the kidneys or ureters. No hydronephrosis. No perinephric or periureteral stranding. No filling defects of the partially visualized collecting systems on delayed imaging. Urinary bladder is unremarkable. GI AND BOWEL: Stomach demonstrates no acute abnormality. No small or large bowel thickening or dilatation. The appendix is unremarkable. Stool throughout the ascending colon  and proximal to mid transverse colon. There is no bowel obstruction. PERITONEUM AND RETROPERITONEUM: No ascites. No free air. VASCULATURE: Aorta is normal in caliber. Mild atherosclerotic plaque. LYMPH NODES: No lymphadenopathy. REPRODUCTIVE ORGANS: The uterus and bilateral adnexal regions are unremarkable. BONES AND SOFT TISSUES: No acute osseous abnormality. No focal soft tissue abnormality. IMPRESSION: 1. Grossly stable multiloculated partially calcified chronic 13 x 7 cm right inferior hepatic lobe subhepatic gas and fluid collection with an indwelling surgical drain. Collection abuts the hepatic flexure. 2. Hepatosplenomegaly. Electronically signed by: Morgane Naveau MD 11/28/2024 09:07 PM EST RP Workstation: [id number]   DG Chest Port 1 View Result Date: 11/28/2024 CLINICAL  DATA:  Syncope EXAM: PORTABLE CHEST 1 VIEW COMPARISON:  08/12/2024 FINDINGS: The heart size and mediastinal contours are within normal limits. Both lungs are clear. The visualized skeletal structures are unremarkable. IMPRESSION: No active disease. Electronically Signed   By: Ozell Daring M.D.   On: 11/28/2024 18:35    EKG: Independently reviewed.   Assessment/Plan  Syncope r/o cardiac etiology vs severe volume depletion  -patient with syncope intermittent x 2 months  -admit to cardiac tele  -f/u with echo in am  - monitor on tele may need event monitor  - initial cardiac ischemic evaluation is unremarkable -continue with ivfs  -attempt to check orthostatics in am , difficulty with this in ed due to severe symptoms.  Hx of DVT/PE -patient states she is complaint with her medications  - to be complete will f/u with CTPE -continue on OAC    Possible UTI  - UA poor specimen  - but with increase wbc and syncope , concern for possible infection  -check inflammatory markers  - zosyn  for now  -de-escalate as able   GERD -ppi   HTN -patient denies hx of HTN  - notes does not take medication for HTN    Lymphedema of b/l lower extremities -per patient at baseline no concerns   OSA -patient denies hx of OSA, does not use  cpap   Hepatic abscess s/p drain insertion -on broad spectrum abx  -will hold home oral abx currently    DVT prophylaxis: xarelto  Code Status: full/ as discussed per patient wishes in event of cardiac arrest  Family Communication: none at bedside Disposition Plan: patient  expected to be admitted greater than 2 midnights  Consults called: n/a Admission status: progressive care   Camila DELENA Ned MD [facility]  If 7PM-7AM, please contact night-coverage [email or web address] Password [facility]  11/28/2024, 9:37 PM

## 2024-11-29 ENCOUNTER — Inpatient Hospital Stay (HOSPITAL_COMMUNITY)

## 2024-11-29 ENCOUNTER — Other Ambulatory Visit: Payer: Self-pay

## 2024-11-29 ENCOUNTER — Encounter (HOSPITAL_COMMUNITY): Payer: Self-pay | Admitting: Internal Medicine

## 2024-11-29 LAB — CBC
HCT: 34 % — ABNORMAL LOW (ref 36.0–46.0)
Hemoglobin: 10.2 g/dL — ABNORMAL LOW (ref 12.0–15.0)
MCH: 25.5 pg — ABNORMAL LOW (ref 26.0–34.0)
MCHC: 30 g/dL (ref 30.0–36.0)
MCV: 85 fL (ref 80.0–100.0)
Platelets: 415 K/uL — ABNORMAL HIGH (ref 150–400)
RBC: 4 MIL/uL (ref 3.87–5.11)
RDW: 15.1 % (ref 11.5–15.5)
WBC: 13.1 K/uL — ABNORMAL HIGH (ref 4.0–10.5)
nRBC: 0 % (ref 0.0–0.2)

## 2024-11-29 LAB — ECHOCARDIOGRAM COMPLETE
AR max vel: 2.5 cm2
AV Area VTI: 3.5 cm2
AV Area mean vel: 2.46 cm2
AV Mean grad: 6 mmHg
AV Peak grad: 10.5 mmHg
Ao pk vel: 1.62 m/s
Area-P 1/2: 6.54 cm2
Calc EF: 73.8 %
Height: 64 in
MV VTI: 4.67 cm2
S' Lateral: 3.2 cm
Single Plane A2C EF: 60.1 %
Single Plane A4C EF: 80.1 %
Weight: 7040 [oz_av]

## 2024-11-29 LAB — PROTIME-INR
INR: 1.8 — ABNORMAL HIGH (ref 0.8–1.2)
Prothrombin Time: 21.4 s — ABNORMAL HIGH (ref 11.4–15.2)

## 2024-11-29 LAB — TROPONIN I (HIGH SENSITIVITY): Troponin I (High Sensitivity): 5 ng/L (ref <18)

## 2024-11-29 LAB — COMPREHENSIVE METABOLIC PANEL WITH GFR
ALT: 12 U/L (ref 0–44)
AST: 21 U/L (ref 15–41)
Albumin: 2.7 g/dL — ABNORMAL LOW (ref 3.5–5.0)
Alkaline Phosphatase: 71 U/L (ref 38–126)
Anion gap: 13 (ref 5–15)
BUN: 17 mg/dL (ref 6–20)
CO2: 19 mmol/L — ABNORMAL LOW (ref 22–32)
Calcium: 8 mg/dL — ABNORMAL LOW (ref 8.9–10.3)
Chloride: 105 mmol/L (ref 98–111)
Creatinine, Ser: 0.93 mg/dL (ref 0.44–1.00)
GFR, Estimated: >60 mL/min (ref >60)
Glucose, Bld: 176 mg/dL — ABNORMAL HIGH (ref 70–99)
Potassium: 3.6 mmol/L (ref 3.5–5.1)
Sodium: 137 mmol/L (ref 135–145)
Total Bilirubin: 0.7 mg/dL (ref 0.0–1.2)
Total Protein: 7 g/dL (ref 6.5–8.1)

## 2024-11-29 LAB — GLUCOSE, CAPILLARY
Glucose-Capillary: 189 mg/dL — ABNORMAL HIGH (ref 70–99)
Glucose-Capillary: 144 mg/dL — ABNORMAL HIGH (ref 70–99)
Glucose-Capillary: 187 mg/dL — ABNORMAL HIGH (ref 70–99)

## 2024-11-29 LAB — LACTIC ACID, PLASMA: Lactic Acid, Venous: 3.2 mmol/L (ref 0.5–1.9)

## 2024-11-29 MED ORDER — IOHEXOL 350 MG/ML SOLN
75.0000 mL | Freq: Once | INTRAVENOUS | Status: AC | PRN
  Administered 2024-11-29: 75 mL via INTRAVENOUS

## 2024-11-29 MED ORDER — METHOCARBAMOL 500 MG PO TABS
500.0000 mg | ORAL_TABLET | Freq: Once | ORAL | Status: AC
  Administered 2024-11-29: 500 mg via ORAL
  Filled 2024-11-29: qty 1

## 2024-11-29 MED ORDER — ORAL CARE MOUTH RINSE: 15.0000 mL | OROMUCOSAL | Status: DC | PRN

## 2024-11-29 MED ORDER — INSULIN ASPART 100 UNIT/ML IJ SOLN
0.0000 [IU] | Freq: Three times a day (TID) | INTRAMUSCULAR | Status: DC
  Administered 2024-11-29 (×2): 1 [IU] via SUBCUTANEOUS
  Administered 2024-11-30 (×2): 2 [IU] via SUBCUTANEOUS
  Administered 2024-11-30: 1 [IU] via SUBCUTANEOUS
  Administered 2024-12-01 – 2024-12-03 (×9): 2 [IU] via SUBCUTANEOUS
  Administered 2024-12-04 (×2): 3 [IU] via SUBCUTANEOUS
  Administered 2024-12-05 (×2): 2 [IU] via SUBCUTANEOUS
  Filled 2024-11-29 (×2): qty 1
  Filled 2024-11-29 (×5): qty 2
  Filled 2024-11-29: qty 1
  Filled 2024-11-29: qty 3
  Filled 2024-11-29 (×7): qty 2
  Filled 2024-11-29: qty 3
  Filled 2024-11-29 (×2): qty 2

## 2024-11-29 MED ORDER — METHOCARBAMOL 500 MG PO TABS
500.0000 mg | ORAL_TABLET | Freq: Three times a day (TID) | ORAL | Status: DC | PRN
  Administered 2024-11-29 – 2024-11-30 (×3): 500 mg via ORAL
  Filled 2024-11-29 (×3): qty 1

## 2024-11-29 MED ORDER — MAGNESIUM SULFATE 2 GM/50ML IV SOLN
2.0000 g | Freq: Once | INTRAVENOUS | Status: AC
  Administered 2024-11-29: 2 g via INTRAVENOUS
  Filled 2024-11-29: qty 50

## 2024-11-29 MED ORDER — PERFLUTREN LIPID MICROSPHERE
1.0000 mL | INTRAVENOUS | Status: AC | PRN
  Administered 2024-11-29: 2 mL via INTRAVENOUS

## 2024-11-29 MED ORDER — PIPERACILLIN-TAZOBACTAM 3.375 G IVPB 30 MIN
3.3750 g | Freq: Once | INTRAVENOUS | Status: AC
  Administered 2024-11-29: 3.375 g via INTRAVENOUS
  Filled 2024-11-29: qty 50

## 2024-11-29 MED ORDER — PIPERACILLIN-TAZOBACTAM 3.375 G IVPB: 3.3750 g | Freq: Three times a day (TID) | INTRAVENOUS | Status: DC

## 2024-11-29 MED ORDER — AMOXICILLIN-POT CLAVULANATE 875-125 MG PO TABS
1.0000 | ORAL_TABLET | Freq: Two times a day (BID) | ORAL | Status: DC
  Administered 2024-11-29 – 2024-12-05 (×12): 1 via ORAL
  Filled 2024-11-29 (×12): qty 1

## 2024-11-29 NOTE — Progress Notes (Cosign Needed Addendum)
 Referring Physician(s): Dr. Kathrin  Supervising Physician: Johann Sieving  Patient Status:  [facility] - In-pt  Chief Complaint:  Status post subhepatic cyst drain placed 08/13/2024  Brief history: 48 year old female with a history of complex right abdominal cyst. She underwent aspiration in IR on 8/22 with Dr. Vanice in which 5L of fluid was aspirated from the cyst. Culture from fluid grew klebsiella pneumoniae and IR subsequently placed a drain on 8/25 with Dr. ONEIDA Vanice.  Drain initially put out copious output with plans for possible sclerotherapy complicated by infection of the cyst.  At most recent clinic visit on 11/17/2024, plan was made for patient to follow-up with infectious disease (she was still within course of antibiotics at that time) and for IR to potentially pull the drain at the next pleasant clinic visit in late December. In the meantime the patient has been admitted after syncopal episode leading team to work to move up timeline for patient. IR consulted for drain eval.  Subjective:  Patient resting in bed, tells me output has been low (less than 15 mL/day.  She denies nausea, vomiting, fever, chills, abdominal pain.  Allergies: Adhesive [tape]  Medications: Prior to Admission medications   Medication Sig Start Date End Date Taking? Authorizing Provider  amoxicillin -clavulanate (AUGMENTIN ) 875-125 MG tablet TAKE 1 TABLET BY MOUTH TWICE A DAY 11/09/24  Yes Luiz Channel, MD  XARELTO  20 MG TABS tablet TAKE 1 TABLET BY MOUTH DAILY WITH SUPPER. 11/12/24  Yes Tanda Bleacher, MD  acetaminophen  (TYLENOL ) 325 MG tablet Take 2 tablets (650 mg total) by mouth every 6 (six) hours as needed for mild pain (or Fever >/= 101). Patient not taking: Reported on 11/28/2024 04/11/22   Elgergawy, Brayton RAMAN, MD  cyanocobalamin  ([employer] VITAMIN B12) 1000 MCG tablet Take 1 tablet (1,000 mcg total) by mouth daily. Patient not taking: Reported on 11/28/2024 01/28/23   Brien Belvie BRAVO, MD   cyanocobalamin  1000 MCG tablet Take 1 tablet (1,000 mcg total) by mouth daily. Patient not taking: Reported on 11/28/2024 08/19/24   Waymond Cart, MD  diclofenac  Sodium (VOLTAREN ) 1 % GEL Apply 4 g topically 4 (four) times daily. Patient not taking: Reported on 11/28/2024 08/19/24   Waymond Cart, MD  ondansetron  (ZOFRAN -ODT) 4 MG disintegrating tablet Take 1 tablet (4 mg total) by mouth every 8 (eight) hours as needed for nausea or vomiting. Patient not taking: Reported on 11/28/2024 08/19/24   Waymond Cart, MD  senna-docusate (SENOKOT-S) 8.6-50 MG tablet Take 1 tablet by mouth at bedtime as needed for mild constipation. Patient not taking: Reported on 11/28/2024 08/19/24   Waymond Cart, MD  sodium chloride  flush 0.9 % SOLN injection Flush 5-10mL of saline into catheter daily to prevent clogging Patient not taking: Reported on 11/28/2024 08/17/24   Spencer Glennon HERO, PA-C     Vital Signs: BP (!) 169/93 (BP Location: Left Arm)   Pulse 96   Temp 98.1 F (36.7 C)   Resp 18   Ht 5' 4 (1.626 m)   Wt (!) 440 lb (199.6 kg)   SpO2 97%   BMI 75.53 kg/m   Physical Exam Pulmonary:     Effort: Pulmonary effort is normal.  Abdominal:     Palpations: Abdomen is soft.     Tenderness: There is no abdominal tenderness.     Comments: Drain present right abdomen, insertion site unremarkable, collection bulb appropriately charged, 5 cc primarily serous with scant milky material present in collection bulb.  Skin:    General: Skin is  warm and dry.  Neurological:     Mental Status: She is oriented to person, place, and time.  Psychiatric:        Mood and Affect: Mood normal.        Behavior: Behavior normal.        Thought Content: Thought content normal.      Imaging: CT Angio Chest Pulmonary Embolism (PE) W or WO Contrast Result Date: 11/29/2024 CLINICAL DATA:  Syncope. EXAM: CT ANGIOGRAPHY CHEST WITH CONTRAST TECHNIQUE: Multidetector CT imaging of the chest was performed using the standard protocol  during bolus administration of intravenous contrast. Multiplanar CT image reconstructions and MIPs were obtained to evaluate the vascular anatomy. RADIATION DOSE REDUCTION: This exam was performed according to the departmental dose-optimization program which includes automated exposure control, adjustment of the mA and/or kV according to patient size and/or use of iterative reconstruction technique. CONTRAST:  75mL OMNIPAQUE  IOHEXOL  350 MG/ML SOLN COMPARISON:  04/08/2022. FINDINGS: Cardiovascular: Image quality is degraded by respiratory motion, limiting evaluation of the segmental and subsegmental pulmonary arteries. No central or lobar pulmonary embolus. Atherosclerotic calcification of the aorta. Enlarged pulmonic trunk and heart. No pericardial effusion. Mediastinum/Nodes: No pathologically enlarged mediastinal, hilar or axillary lymph nodes. No pathologically enlarged mediastinal, hilar or axillary lymph nodes. Esophagus is grossly unremarkable. Distal periesophageal lymph nodes are not enlarged by CT size criteria. Lungs/Pleura: Image quality is degraded by expiratory phase imaging and respiratory motion. No pleural fluid. Airway is otherwise unremarkable. Upper Abdomen: Liver margin is irregular. Small right adrenal adenoma. No specific follow-up necessary. Visualized portions of the liver, adrenal glands, left kidney, spleen, pancreas, stomach and bowel are otherwise grossly unremarkable. No upper abdominal adenopathy. Musculoskeletal: Degenerative changes in the spine. Review of the MIP images confirms the above findings. IMPRESSION: 1. Image quality is degraded by respiratory motion, limiting the evaluation of segmental and subsegmental pulmonary arteries. No central or lobar pulmonary embolus. 2. Cirrhosis. 3. Small right adrenal adenoma. 4.  Aortic atherosclerosis (ICD10-I70.0). 5. Enlarged pulmonic trunk, indicative of pulmonary arterial hypertension. Electronically Signed   By: Newell Eke M.D.    On: 11/29/2024 09:04   CT ABDOMEN PELVIS W CONTRAST Result Date: 11/28/2024 EXAM: CT ABDOMEN AND PELVIS WITH CONTRAST 11/28/2024 08:30:00 PM TECHNIQUE: CT of the abdomen and pelvis was performed with the administration of 100 mL of iohexol  (OMNIPAQUE ) 350 MG/ML injection. Multiplanar reformatted images are provided for review. Automated exposure control, iterative reconstruction, and/or weight-based adjustment of the mA/kV was utilized to reduce the radiation dose to as low as reasonably achievable. COMPARISON: 11/17/2024, 08/22/2024, 04/16/2022. CLINICAL HISTORY: Abdominal pain, acute, nonlocalized; hx hepatic abscess. FINDINGS: LOWER CHEST: Mitral annular calcification. LIVER: The liver is enlarged, measuring up to 21 cm. Redemonstration of the grossly stable multiloculated partially calcified chronic 13 x 7 cm right inferior hepatic lobe subhepatic gas and fluid complex lesion. Findings abuts the hepatic flexure. A surgical drain with a pigtail within the collection is again noted. GALLBLADDER AND BILE DUCTS: Gallbladder is unremarkable. No biliary ductal dilatation. SPLEEN: The spleen is enlarged, measuring up to 14 cm. PANCREAS: No acute abnormality. ADRENAL GLANDS: No acute abnormality. KIDNEYS, URETERS AND BLADDER: No stones in the kidneys or ureters. No hydronephrosis. No perinephric or periureteral stranding. No filling defects of the partially visualized collecting systems on delayed imaging. Urinary bladder is unremarkable. GI AND BOWEL: Stomach demonstrates no acute abnormality. No small or large bowel thickening or dilatation. The appendix is unremarkable. Stool throughout the ascending colon and proximal to mid transverse colon. There  is no bowel obstruction. PERITONEUM AND RETROPERITONEUM: No ascites. No free air. VASCULATURE: Aorta is normal in caliber. Mild atherosclerotic plaque. LYMPH NODES: No lymphadenopathy. REPRODUCTIVE ORGANS: The uterus and bilateral adnexal regions are unremarkable.  BONES AND SOFT TISSUES: No acute osseous abnormality. No focal soft tissue abnormality. IMPRESSION: 1. Grossly stable multiloculated partially calcified chronic 13 x 7 cm right inferior hepatic lobe subhepatic gas and fluid collection with an indwelling surgical drain. Collection abuts the hepatic flexure. 2. Hepatosplenomegaly. Electronically signed by: Morgane Naveau MD 11/28/2024 09:07 PM EST RP Workstation: [id number]   DG Chest Port 1 View Result Date: 11/28/2024 CLINICAL DATA:  Syncope EXAM: PORTABLE CHEST 1 VIEW COMPARISON:  08/12/2024 FINDINGS: The heart size and mediastinal contours are within normal limits. Both lungs are clear. The visualized skeletal structures are unremarkable. IMPRESSION: No active disease. Electronically Signed   By: Ozell Daring M.D.   On: 11/28/2024 18:35    Labs:  CBC: Recent Labs    08/19/24 0610 08/22/24 0623 11/28/24 1817 11/29/24 0109  WBC 11.8* 10.2 11.8* 13.1*  HGB 8.4* 9.2* 10.1* 10.2*  HCT 28.1* 31.9* 32.4* 34.0*  PLT 543* 608* PLATELET CLUMPS NOTED ON SMEAR, COUNT APPEARS ADEQUATE 415*    COAGS: Recent Labs    08/15/24 0334 08/16/24 0353 08/17/24 0411 11/29/24 0109  INR 3.0* 2.8* 3.2* 1.8*    BMP: Recent Labs    08/19/24 0610 08/22/24 0623 11/28/24 1817 11/29/24 0109  NA 133* 135 138 137  K 4.4 3.9 3.6 3.6  CL 96* 98 110 105  CO2 26 24 20* 19*  GLUCOSE 207* 171* 130* 176*  BUN 9 14 16 17   CALCIUM  8.3* 8.8* 7.2* 8.0*  CREATININE 0.73 1.00 0.81 0.93  GFRNONAA >60 >60 >60 >60    LIVER FUNCTION TESTS: Recent Labs    08/19/24 0610 08/22/24 0623 11/28/24 1817 11/29/24 0109  BILITOT 0.4 0.4 0.9 0.7  AST 15 17 19 21   ALT 15 14 11 12   ALKPHOS 101 91 63 71  PROT 7.1 7.4 6.6 7.0  ALBUMIN 1.5* 1.9* 2.4* 2.7*    Assessment and Plan:  Drain output has been low (less than 15 cc/day) with output appearance being primarily serous with milky material at the bottom of collection bulb.  Flushes and aspirates easily. An  updated CT was obtained by the inpatient team which reads as being a multiloculated, partially calcified chronic 13 x 7 cm subhepatic collection.  Dr. Johann also reviewed the imaging and notes little to no true fluid collection this area primarily being shell reflecting chronicity of the cyst.  Infectious disease has been contacted as part of hospitalization. Drain offers little additional benefit per IR MD evaluation, but pulling the drain was being held until ID felt infectious aspect of this was covered adequately. We suspect it will recur, but Dr. Jenna wanted to hold sclerotherapy for if cyst recurs and is not infected per last clinic note.   ID plans to continue abx for 3 more weeks.   Electronically Signed: Laymon Coast, NP 11/29/2024, 4:56 PM   I spent a total of 15 Minutes at the the patient's bedside AND on the patient's hospital floor or unit, greater than 50% of which was counseling/coordinating care for subhepatic cyst management.

## 2024-11-29 NOTE — ED Notes (Signed)
 Attempted to call report, nurse states that the other nurse is giving meds at this time, but will call this RN's number back when finished.

## 2024-11-29 NOTE — Progress Notes (Signed)
    EXPEDITER LEVEL LOADING ASSESSMENT NOTE  Patient Name: Susan Warren  DOB:1976/09/21 Date of Admission: 11/28/2024  Date of Assessment:11/29/24   -------------------------------------------------------------------------------------------------------------------   Brief clinical summary: 48 y.o. female with medical history significant of GERD,DVT/PE on xarelto , HTN, lymphedema of b/l lower extremities, OSA, anxiety , migraines  , hepatic abscess s/p drain insertion, who presents to ED with complaint of syncope BIBEMS. Per patient states she has had syncope episodes intermittent over the last 2 months but it has gotten more severe and frequent with palpitations over the last  few days   Is there Bed Availability at another [facility]? Yes  If yes, what facility: [name]  Level of Care Needed:  Yes  MD Agree to transfer: Yes  Patient agrees to transfer: Yes    -------------------------------------------------------------------------------------------------------------------  [facility] RN Expediter, Chelsia Serres S Yidel Teuscher Please contact us  directly via secure chat (search for [facility]) or by calling us  at [phone] [facility]).

## 2024-11-29 NOTE — Evaluation (Signed)
 Physical Therapy Evaluation Patient Details Name: Susan Warren MRN: [id number] DOB: 09/04/1976 Today's Date: 11/29/2024  History of Present Illness  Pt is 48 yo female admitted 11/28/24 after syncopal episode. Pt with hx including but not limited to DVT, GERD, HTN, lymphedema bil LE, migraines, morbid obexity with BMI>70, PE, hospitalization in Aug 25 for hepatic cyst with drain placement  Clinical Impression  Pt admitted with above diagnosis. At baseline, pt independent.  She does live with family and has support if needed.  PT evaluation was limited by syncopal symptoms.  Pt did have a mild drop in orthostatic BP during session but she reports improved from admission (states systolic was dropping into 70's at admission).  Did perform vestibular screen as pt did report some dizziness prior to lightheadedness.  Extraocular motor exam and was negative.  Pt reports that if symptoms occur when standing she will look down and they improve until she can get to a chair and that if she looks up symptoms worsen.  Considering this performed modified vertebral artery test prior to performing further vestibular testing and pt did have lightheadedness and heaviness in arms with neck extension with rotation occurring within 10 seconds.  Notified MD of findings. Did not ambulate due to syncopal symptoms but otherwise mobility at supervision level.  Pt currently with functional limitations due to the deficits listed below (see PT Problem List). Pt will benefit from acute skilled PT to increase their independence and safety with mobility to allow discharge.  Pt likely with no PT needs at d/c.          If plan is discharge home, recommend the following:     Can travel by private vehicle        Equipment Recommendations None recommended by PT  Recommendations for Other Services       Functional Status Assessment Patient has had a recent decline in their functional status and demonstrates the ability to  make significant improvements in function in a reasonable and predictable amount of time.     Precautions / Restrictions Precautions Precautions: Fall      Mobility  Bed Mobility Overal bed mobility: Needs Assistance Bed Mobility: Supine to Sit, Sit to Supine     Supine to sit: Supervision, Used rails, HOB elevated Sit to supine: Supervision, HOB elevated, Used rails        Transfers Overall transfer level: Needs assistance Equipment used: None Transfers: Sit to/from Stand Sit to Stand: Supervision           General transfer comment: STS x 2; close supervision due to syncopal symptoms    Ambulation/Gait               General Gait Details: not tested due to syncopal symptoms  Stairs            Wheelchair Mobility     Tilt Bed    Modified Rankin (Stroke Patients Only)       Balance Overall balance assessment: Needs assistance Sitting-balance support: No upper extremity supported Sitting balance-Leahy Scale: Good     Standing balance support: No upper extremity supported Standing balance-Leahy Scale: Fair                               Pertinent Vitals/Pain Pain Assessment Pain Assessment: No/denies pain    Home Living Family/patient expects to be discharged to:: Private residence Living Arrangements: Children (son who is 15) Available Help at Discharge:  Family;Available 24 hours/day Type of Home: House Home Access: Stairs to enter Entrance Stairs-Rails: Right;Left;Can reach both Entrance Stairs-Number of Steps: 2   Home Layout: One level Home Equipment: Cane - single point      Prior Function Prior Level of Function : Independent/Modified Independent;Working/employed;Driving             Mobility Comments: Could ambulate in community without AD ADLs Comments: Pt is ind with ADL's and works from home.     Extremity/Trunk Assessment   Upper Extremity Assessment Upper Extremity Assessment: Overall WFL for tasks  assessed    Lower Extremity Assessment Lower Extremity Assessment: Overall WFL for tasks assessed (some limitations with ROM due to lymphedema/body habitus)    Cervical / Trunk Assessment Cervical / Trunk Assessment: Normal  Communication        Cognition Arousal: Alert Behavior During Therapy: WFL for tasks assessed/performed   PT - Cognitive impairments: No apparent impairments                                 Cueing       General Comments  Pt reports hx of syncopal symptoms since September.  Describes symptoms as spinning and dizzy followed by tunnel vision, decreased hearing, heaviness, clammy, and the one episode of full syncope.  States symptoms generally occur upon first standing or sitting, but also have occurred even when she has been up for a bit.  Initially only having occasional episode but have increased. Denied any falls or jarring events.  Did have hospitalization for hepatic cyst in August but symptoms not occurring until after.  Pt does report when symptoms happen in standing she looks down and they slightly improve and get worse if looking up.    -Orthostatic BP monitored and as follows (due to body habitus, BP have been taken on forearm, pt reports same location when nursing testing):  Supine 151/93 (HR 100) Sitting 125/95 (HR 112); mild symptoms Standing 123/89 (HR 120); + syncopal symptoms had to return to supine Returned to sitting and symptoms improving. Stood again 119/69; + syncopal symptoms, worse than initial stand Return to sitting 160/93  -Extraocular Exam: negative nystagmus, gaze stabilization intact, smooth pursuit intact, some mild dizziness with head turns but not her typical symptoms  -Considered testing Shona Leotha [place] as she did report some spinning sensations; however, pt also reports increased symptoms with neck ext and decreased with neck flexion.  Performed modified vertebral artery test in sitting and pt with +lightheadedness and  heaviness in arms and body with neck ext with rotation to both sides in < 10 seconds.  Did not further test.     Exercises     Assessment/Plan    PT Assessment Patient needs continued PT services  PT Problem List Decreased mobility;Decreased activity tolerance;Cardiopulmonary status limiting activity;Decreased balance       PT Treatment Interventions DME instruction;Gait training;Functional mobility training;Therapeutic activities;Patient/family education;Therapeutic exercise;Balance training    PT Goals (Current goals can be found in the Care Plan section)  Acute Rehab PT Goals Patient Stated Goal: improve dizziness PT Goal Formulation: With patient/family Time For Goal Achievement: 12/13/24 Potential to Achieve Goals: Good    Frequency Min 2X/week     Co-evaluation               AM-PAC PT 6 Clicks Mobility  Outcome Measure Help needed turning from your back to your side while in a flat bed without  using bedrails?: None Help needed moving from lying on your back to sitting on the side of a flat bed without using bedrails?: A Little Help needed moving to and from a bed to a chair (including a wheelchair)?: A Little Help needed standing up from a chair using your arms (e.g., wheelchair or bedside chair)?: A Little Help needed to walk in hospital room?: A Little Help needed climbing 3-5 steps with a railing? : A Little 6 Click Score: 19    End of Session   Activity Tolerance: Treatment limited secondary to medical complications (Comment) Patient left: in bed;with call bell/phone within reach Nurse Communication: Mobility status PT Visit Diagnosis: Other abnormalities of gait and mobility (R26.89);Dizziness and giddiness (R42)    Time: 8340-8270 PT Time Calculation (min) (ACUTE ONLY): 30 min   Charges:   PT Evaluation $PT Eval Moderate Complexity: 1 Mod PT Treatments $Therapeutic Activity: 8-22 mins PT General Charges $$ ACUTE PT VISIT: 1 Visit          Benjiman, PT [facility] [phone]   Benjiman VEAR Mulberry 11/29/2024, 6:04 PM

## 2024-11-29 NOTE — ED Notes (Addendum)
 ED Expediter April Harper, RN reached out to this RN to ask patient about possible admission at [facility]. This RN asked patient, patient stated that it would be fine, we can go to [facility]! ED Expediter notified.

## 2024-11-29 NOTE — Progress Notes (Signed)
  Echocardiogram 2D Echocardiogram has been performed.  Susan Warren ORN [facility] 11/29/2024, 12:25 PM

## 2024-11-29 NOTE — Progress Notes (Signed)
 PROGRESS NOTE  Susan Warren FMW:[id number] DOB: 1976/01/07   PCP: Tanda Bleacher, MD  Patient is from: Home.  DOA: 11/28/2024 LOS: 1  Chief complaints Chief Complaint  Patient presents with   Loss of Consciousness     Brief Narrative / Interim history: 48 year old F with PMH of morbid obesity, lymphedema, DVT/PE on Xarelto , HTN, GERD, migraine headache, hepatic abscess s/p drain placement currently on Augmentin  presenting with syncopal episodes and palpitation.  Patient has had intermittent near syncope over the last 2 months.  She lost consciousness while on commode on the day of presentation.  Denies fall.  In ED, HR ranges from 112-138.  WBC 11.8 with left shift.  CMP without significant finding.  Lactic acid 2.7.  UA with pyuria, microscopic hematuria and bacteria.  Pro-Cal negative.  Serial troponin and BNP negative.  EKG showed sinus tachycardia to 110.  CT angio chest without large central PE but suggested cirrhosis and possible PAH.  CT abdomen and pelvis with grossly stable multiloculated partially calcified chronic 13 x 7 cm right inferior hepatic lobe subhepatic gas and fluid collection with an indwelling surgical drain, and hepatosplenomegaly.  Urine culture sent.  Cardiogram ordered.  Started on IV Zosyn  and admitted    Subjective: Seen and examined earlier this morning.  No major events overnight or this morning.  Continues to endorse lightheadedness and palpitation with activity.  Denies chest pain, shortness of breath, cough, GI or UTI symptoms.  Minimal drainage from JP drain.   Assessment and plan: Possible syncope: Patient reports 2 months of intermittent palpitation and near syncope.  Reports LOC while on commode yesterday.  No fall.  Continues to endorse lightheadedness and palpitation with exertion.  Denies chest pain or dyspnea.  CT angio chest negative for central PE but suggested possible liver cirrhosis and PAH.  EKG with mild sinus tachycardia at 110.   Serial troponin and BNP normal.  Low suspicion for infection. -Continue telemetry monitoring -Check echocardiogram -Orthostatic vitals -PT/OT eval  Hx of DVT/PE: Reports compliance with Xarelto .  CT angio chest with no central PE. - Continue home Xarelto .   Pyuria/bacteriuria/microscopic hematuria: Patient denies UTI symptoms, fever or abdominal pain.  Started on IV Zosyn  on admission.  - Discontinue IV Zosyn  - Resume home Augmentin   Hepatic abscess s/p drain insertion: Patient reports minimal drainage from JP drain.  CT abdomen and pelvis as above.  -Resume home Augmentin . -Notified ID  Possible liver cirrhosis/hepatosplenomegaly -Needs outpatient follow-up with GI on discharge.  Lactic acidosis: Likely dehydration.  She has leukocytosis but no fever.  Pro-Cal negative.  On p.o. Augmentin  for liver cirrhosis.  UA concerning but she denies UTI symptoms. -Continue IV fluid -Recycle lactic acid   Lymphedema of b/l lower extremities -per patient at baseline no concerns  Essential hypertension: Normotensive for most part.  Not on meds at home.  -Continue monitoring  NIDDM-2 with hyperglycemia: A1c 6.8%.  Not on meds at home. - Start SSI-sensitive addendum page.  Hypomagnesemia - IV magnesium  sulfate 2 g   Morbid obesity Body mass index is 75.53 kg/m.          DVT prophylaxis:   rivaroxaban  (XARELTO ) tablet 20 mg  Code Status: Full code Family Communication: None at bedside Level of care: Telemetry Status is: Inpatient Remains inpatient appropriate because: Syncope   Final disposition: To be determined   55 minutes with more than 50% spent in reviewing records, counseling patient/family and coordinating care.  Consultants:  Infectious disease  Procedures: None  Microbiology  summarized: Urine culture  Objective: Vitals:   11/29/24 0245 11/29/24 0352 11/29/24 0353 11/29/24 0945  BP:  125/79  (!) 153/89  Pulse: 91 98  (!) 110  Resp: (!) 24 (!) 22  18   Temp:  98.6 F (37 C)  98 F (36.7 C)  TempSrc:  Oral  Oral  SpO2: 95% 100% 100% 100%  Weight:  (!) 199.6 kg    Height:  5' 4 (1.626 m)      Examination:  GENERAL: No apparent distress.  Nontoxic. HEENT: MMM.  Vision and hearing grossly intact.  NECK: Supple.  No apparent JVD.  RESP:  No IWOB.  Fair aeration bilaterally. CVS:  RRR. Heart sounds normal.  ABD/GI/GU: BS+. Abd soft, NTND.  JP drain in place.  Minimal output. MSK/EXT:  Moves extremities.  Bilateral lymphedema. SKIN: no apparent skin lesion or wound NEURO: AA.  Oriented appropriately.  No apparent focal neuro deficit. PSYCH: Calm. Normal affect.   Sch Meds:  Scheduled Meds:  amoxicillin -clavulanate  1 tablet Oral BID   insulin  aspart  0-9 Units Subcutaneous TID WC   rivaroxaban   20 mg Oral Q supper   Continuous Infusions:  sodium chloride  125 mL/hr at 11/29/24 1007   PRN Meds:.acetaminophen  **OR** acetaminophen , ondansetron  **OR** ondansetron  (ZOFRAN ) IV  Antimicrobials: Anti-infectives (From admission, onward)    Start     Dose/Rate Route Frequency Ordered Stop   11/29/24 2000  amoxicillin -clavulanate (AUGMENTIN ) 875-125 MG per tablet 1 tablet        1 tablet Oral 2 times daily 11/29/24 1046     11/29/24 1400  piperacillin -tazobactam (ZOSYN ) IVPB 3.375 g  Status:  Discontinued        3.375 g 12.5 mL/hr over 240 Minutes Intravenous Every 8 hours 11/29/24 0718 11/29/24 1046   11/29/24 0730  piperacillin -tazobactam (ZOSYN ) IVPB 3.375 g        3.375 g 100 mL/hr over 30 Minutes Intravenous  Once 11/29/24 0718 11/29/24 0902   11/28/24 2300  cefTRIAXone  (ROCEPHIN ) 1 g in sodium chloride  0.9 % 100 mL IVPB  Status:  Discontinued        1 g 200 mL/hr over 30 Minutes Intravenous Every 24 hours 11/28/24 2227 11/29/24 0647        I have personally reviewed the following labs and images: CBC: Recent Labs  Lab 11/28/24 1817 11/29/24 0109  WBC 11.8* 13.1*  NEUTROABS 9.5*  --   HGB 10.1* 10.2*  HCT 32.4*  34.0*  MCV 82.2 85.0  PLT PLATELET CLUMPS NOTED ON SMEAR, COUNT APPEARS ADEQUATE 415*   BMP &GFR Recent Labs  Lab 11/28/24 1817 11/28/24 2234 11/29/24 0109  NA 138  --  137  K 3.6  --  3.6  CL 110  --  105  CO2 20*  --  19*  GLUCOSE 130*  --  176*  BUN 16  --  17  CREATININE 0.81  --  0.93  CALCIUM  7.2*  --  8.0*  MG  --  1.5*  --    Estimated Creatinine Clearance: 128.8 mL/min (by C-G formula based on SCr of 0.93 mg/dL). Liver & Pancreas: Recent Labs  Lab 11/28/24 1817 11/29/24 0109  AST 19 21  ALT 11 12  ALKPHOS 63 71  BILITOT 0.9 0.7  PROT 6.6 7.0  ALBUMIN 2.4* 2.7*   No results for input(s): LIPASE, AMYLASE in the last 168 hours. No results for input(s): AMMONIA in the last 168 hours. Diabetic: Recent Labs    11/28/24 2234  HGBA1C 6.8*   Recent Labs  Lab 11/28/24 1958  GLUCAP 138*   Cardiac Enzymes: No results for input(s): CKTOTAL, CKMB, CKMBINDEX, TROPONINI in the last 168 hours. No results for input(s): PROBNP in the last 8760 hours. Coagulation Profile: Recent Labs  Lab 11/29/24 0109  INR 1.8*   Thyroid Function Tests: Recent Labs    11/28/24 2234  TSH 1.935   Lipid Profile: No results for input(s): CHOL, HDL, LDLCALC, TRIG, CHOLHDL, LDLDIRECT in the last 72 hours. Anemia Panel: No results for input(s): VITAMINB12, FOLATE, FERRITIN, TIBC, IRON, RETICCTPCT in the last 72 hours. Urine analysis:    Component Value Date/Time   COLORURINE AMBER (A) 11/28/2024 1817   APPEARANCEUR TURBID (A) 11/28/2024 1817   LABSPEC >1.046 (H) 11/28/2024 1817   PHURINE 6.0 11/28/2024 1817   GLUCOSEU NEGATIVE 11/28/2024 1817   GLUCOSEU NEGATIVE 01/25/2014 1049   HGBUR LARGE (A) 11/28/2024 1817   BILIRUBINUR NEGATIVE 11/28/2024 1817   KETONESUR NEGATIVE 11/28/2024 1817   PROTEINUR 100 (A) 11/28/2024 1817   UROBILINOGEN 0.2 01/25/2014 1049   NITRITE NEGATIVE 11/28/2024 1817   LEUKOCYTESUR MODERATE (A) 11/28/2024  1817   Sepsis Labs: Invalid input(s): PROCALCITONIN, LACTICIDVEN  Microbiology: Recent Results (from the past 240 hours)  Urine Culture (for pregnant, neutropenic or urologic patients or patients with an indwelling urinary catheter)     Status: None (Preliminary result)   Collection Time: 11/28/24 10:56 PM   Specimen: Urine, Clean Catch  Result Value Ref Range Status   Specimen Description URINE, CLEAN CATCH  Final   Special Requests   Final    ADDED 2256 Performed at [facility], [address], [place], [place] [address]    Culture PENDING  Incomplete   Report Status PENDING  Incomplete    Radiology Studies: CT Angio Chest Pulmonary Embolism (PE) W or WO Contrast Result Date: 11/29/2024 CLINICAL DATA:  Syncope. EXAM: CT ANGIOGRAPHY CHEST WITH CONTRAST TECHNIQUE: Multidetector CT imaging of the chest was performed using the standard protocol during bolus administration of intravenous contrast. Multiplanar CT image reconstructions and MIPs were obtained to evaluate the vascular anatomy. RADIATION DOSE REDUCTION: This exam was performed according to the departmental dose-optimization program which includes automated exposure control, adjustment of the mA and/or kV according to patient size and/or use of iterative reconstruction technique. CONTRAST:  75mL OMNIPAQUE  IOHEXOL  350 MG/ML SOLN COMPARISON:  04/08/2022. FINDINGS: Cardiovascular: Image quality is degraded by respiratory motion, limiting evaluation of the segmental and subsegmental pulmonary arteries. No central or lobar pulmonary embolus. Atherosclerotic calcification of the aorta. Enlarged pulmonic trunk and heart. No pericardial effusion. Mediastinum/Nodes: No pathologically enlarged mediastinal, hilar or axillary lymph nodes. No pathologically enlarged mediastinal, hilar or axillary lymph nodes. Esophagus is grossly unremarkable. Distal periesophageal lymph nodes are not enlarged by CT size criteria. Lungs/Pleura: Image  quality is degraded by expiratory phase imaging and respiratory motion. No pleural fluid. Airway is otherwise unremarkable. Upper Abdomen: Liver margin is irregular. Small right adrenal adenoma. No specific follow-up necessary. Visualized portions of the liver, adrenal glands, left kidney, spleen, pancreas, stomach and bowel are otherwise grossly unremarkable. No upper abdominal adenopathy. Musculoskeletal: Degenerative changes in the spine. Review of the MIP images confirms the above findings. IMPRESSION: 1. Image quality is degraded by respiratory motion, limiting the evaluation of segmental and subsegmental pulmonary arteries. No central or lobar pulmonary embolus. 2. Cirrhosis. 3. Small right adrenal adenoma. 4.  Aortic atherosclerosis (ICD10-I70.0). 5. Enlarged pulmonic trunk, indicative of pulmonary arterial hypertension. Electronically Signed   By:  Newell Eke M.D.   On: 11/29/2024 09:04   CT ABDOMEN PELVIS W CONTRAST Result Date: 11/28/2024 EXAM: CT ABDOMEN AND PELVIS WITH CONTRAST 11/28/2024 08:30:00 PM TECHNIQUE: CT of the abdomen and pelvis was performed with the administration of 100 mL of iohexol  (OMNIPAQUE ) 350 MG/ML injection. Multiplanar reformatted images are provided for review. Automated exposure control, iterative reconstruction, and/or weight-based adjustment of the mA/kV was utilized to reduce the radiation dose to as low as reasonably achievable. COMPARISON: 11/17/2024, 08/22/2024, 04/16/2022. CLINICAL HISTORY: Abdominal pain, acute, nonlocalized; hx hepatic abscess. FINDINGS: LOWER CHEST: Mitral annular calcification. LIVER: The liver is enlarged, measuring up to 21 cm. Redemonstration of the grossly stable multiloculated partially calcified chronic 13 x 7 cm right inferior hepatic lobe subhepatic gas and fluid complex lesion. Findings abuts the hepatic flexure. A surgical drain with a pigtail within the collection is again noted. GALLBLADDER AND BILE DUCTS: Gallbladder is  unremarkable. No biliary ductal dilatation. SPLEEN: The spleen is enlarged, measuring up to 14 cm. PANCREAS: No acute abnormality. ADRENAL GLANDS: No acute abnormality. KIDNEYS, URETERS AND BLADDER: No stones in the kidneys or ureters. No hydronephrosis. No perinephric or periureteral stranding. No filling defects of the partially visualized collecting systems on delayed imaging. Urinary bladder is unremarkable. GI AND BOWEL: Stomach demonstrates no acute abnormality. No small or large bowel thickening or dilatation. The appendix is unremarkable. Stool throughout the ascending colon and proximal to mid transverse colon. There is no bowel obstruction. PERITONEUM AND RETROPERITONEUM: No ascites. No free air. VASCULATURE: Aorta is normal in caliber. Mild atherosclerotic plaque. LYMPH NODES: No lymphadenopathy. REPRODUCTIVE ORGANS: The uterus and bilateral adnexal regions are unremarkable. BONES AND SOFT TISSUES: No acute osseous abnormality. No focal soft tissue abnormality. IMPRESSION: 1. Grossly stable multiloculated partially calcified chronic 13 x 7 cm right inferior hepatic lobe subhepatic gas and fluid collection with an indwelling surgical drain. Collection abuts the hepatic flexure. 2. Hepatosplenomegaly. Electronically signed by: Morgane Naveau MD 11/28/2024 09:07 PM EST RP Workstation: [id number]   DG Chest Port 1 View Result Date: 11/28/2024 CLINICAL DATA:  Syncope EXAM: PORTABLE CHEST 1 VIEW COMPARISON:  08/12/2024 FINDINGS: The heart size and mediastinal contours are within normal limits. Both lungs are clear. The visualized skeletal structures are unremarkable. IMPRESSION: No active disease. Electronically Signed   By: Ozell Daring M.D.   On: 11/28/2024 18:35      Savanna Dooley T. Loretta Doutt [facility]  If 7PM-7AM, please contact night-coverage [email or web address] 11/29/2024, 10:50 AM

## 2024-11-30 ENCOUNTER — Telehealth: Payer: Self-pay

## 2024-11-30 LAB — RENAL FUNCTION PANEL
Albumin: 3.3 g/dL — ABNORMAL LOW (ref 3.5–5.0)
Anion gap: 12 (ref 5–15)
BUN: 21 mg/dL — ABNORMAL HIGH (ref 6–20)
CO2: 21 mmol/L — ABNORMAL LOW (ref 22–32)
Calcium: 9.4 mg/dL (ref 8.9–10.3)
Chloride: 102 mmol/L (ref 98–111)
Creatinine, Ser: 0.74 mg/dL (ref 0.44–1.00)
GFR, Estimated: >60 mL/min (ref >60)
Glucose, Bld: 151 mg/dL — ABNORMAL HIGH (ref 70–99)
Phosphorus: 4.6 mg/dL (ref 2.5–4.6)
Potassium: 4 mmol/L (ref 3.5–5.1)
Sodium: 135 mmol/L (ref 135–145)

## 2024-11-30 LAB — CBC
HCT: 30.4 % — ABNORMAL LOW (ref 36.0–46.0)
Hemoglobin: 9.2 g/dL — ABNORMAL LOW (ref 12.0–15.0)
MCH: 25.4 pg — ABNORMAL LOW (ref 26.0–34.0)
MCHC: 30.3 g/dL (ref 30.0–36.0)
MCV: 84 fL (ref 80.0–100.0)
Platelets: 373 K/uL (ref 150–400)
RBC: 3.62 MIL/uL — ABNORMAL LOW (ref 3.87–5.11)
RDW: 15.2 % (ref 11.5–15.5)
WBC: 8 K/uL (ref 4.0–10.5)
nRBC: 0 % (ref 0.0–0.2)

## 2024-11-30 LAB — GLUCOSE, CAPILLARY
Glucose-Capillary: 191 mg/dL — ABNORMAL HIGH (ref 70–99)
Glucose-Capillary: 199 mg/dL — ABNORMAL HIGH (ref 70–99)
Glucose-Capillary: 183 mg/dL — ABNORMAL HIGH (ref 70–99)
Glucose-Capillary: 178 mg/dL — ABNORMAL HIGH (ref 70–99)

## 2024-11-30 LAB — MAGNESIUM: Magnesium: 1.9 mg/dL (ref 1.7–2.4)

## 2024-11-30 LAB — LACTIC ACID, PLASMA: Lactic Acid, Venous: 1 mmol/L (ref 0.5–1.9)

## 2024-11-30 MED ORDER — MELATONIN 5 MG PO TABS
5.0000 mg | ORAL_TABLET | Freq: Once | ORAL | Status: AC
  Administered 2024-11-30: 5 mg via ORAL
  Filled 2024-11-30: qty 1

## 2024-11-30 NOTE — Progress Notes (Signed)
 PROGRESS NOTE    Susan Warren  FMW:[id number] DOB: 1976/07/10 DOA: 11/28/2024 PCP: Tanda Bleacher, MD    Brief Narrative:  This 48 years old Female with PMH significant for morbid obesity, lymphedema, DVT/PE on Xarelto , HTN, GERD, migraine headache, hepatic abscess s/p drain placement currently on Augmentin  presenting with syncopal episodes and palpitations.   Patient has had intermittent near syncope over the last 2 months.  She lost consciousness while on commode on the day of presentation.  Denies fall.   In ED, HR ranges from 112-138.  WBC 11.8 with left shift.  CMP without significant finding.  Lactic acid 2.7.  UA with pyuria, microscopic hematuria and bacteria.  Pro-Cal negative.  Serial troponin and BNP negative.  EKG showed sinus tachycardia to 110.  CT angio chest without large central PE but suggested cirrhosis and possible PAH.  CT A/P with grossly stable multiloculated partially calcified chronic 13 x 7 cm right inferior hepatic lobe subhepatic gas and fluid collection with an indwelling surgical drain, and hepatosplenomegaly.  Urine culture sent.  Cardiogram ordered.  Started on IV Zosyn  and admitted  Assessment & Plan:   Principal Problem:   Syncope   Possible syncope:  Patient reports 2 months of intermittent palpitations and near syncope.   Reports LOC while on commode one day PTA.  No fall.   Continues to endorse lightheadedness and palpitations with exertion.   Denies chest pain or dyspnea.   CTA chest negative for central PE but suggested possible liver cirrhosis and PAH.   EKG with mild sinus tachycardia at 110.  Serial troponin and BNP normal.  Low suspicion for infection. Continue telemetry monitoring Echo shows LVEF 70 to 75% hyperdynamic function.  No RWMA. Obtain Orthostatic vitals PT/OT eval   Hx of DVT/PE:  Reports compliance with Xarelto .  CT angio chest with no central PE. Continue home Xarelto .   Pyuria/bacteriuria/microscopic hematuria:   Patient denies UTI symptoms, fever or abdominal pain.   Started on IV Zosyn  on admission.  - Discontinue IV Zosyn  - Resume home Augmentin    Hepatic abscess s/p drain insertion:  Patient reports minimal drainage from JP drain.  CT abdomen and pelvis as above.  -Resume home Augmentin . -Discussed with ID, Continue with the drain.   She has a drain clinic Appt with follow up scan on 12/10/24.   Possible liver cirrhosis/hepatosplenomegaly: -Needs outpatient follow-up with GI on discharge.   Lactic acidosis: Likely dehydration.   She has leukocytosis but no fever.  Pro-Cal negative.   Continue Augmentin  for liver cirrhosis.   UA concerning but she denies UTI symptoms. Lactic acid resolved with IV hydration.   Lymphedema of b/l lower extremities -per patient at baseline no concerns.   Essential hypertension:  Normotensive for most part.  Not on meds at home.  -Continue monitoring.   NIDDM-2 with hyperglycemia:  HbA1c 6.8%.  Not on meds at home. - Continue SSI-sensitive.   Hypomagnesemia: Replaced.  Continue to monitor   Morbid obesity: Body mass index is 75.53 kg/m. Diet and exercise discussed in detail.  DVT prophylaxis: Xarelto  Code Status: Full code Family Communication:No family at bed side. Disposition Plan:    Status is: Inpatient Remains inpatient appropriate because:  Admitted for Syncope , workup in progress.   Consultants:  IR ID  Procedures:None  Antimicrobials:  Anti-infectives (From admission, onward)    Start     Dose/Rate Route Frequency Ordered Stop   11/29/24 2000  amoxicillin -clavulanate (AUGMENTIN ) 875-125 MG per tablet 1 tablet  1 tablet Oral 2 times daily 11/29/24 1046     11/29/24 1400  piperacillin -tazobactam (ZOSYN ) IVPB 3.375 g  Status:  Discontinued        3.375 g 12.5 mL/hr over 240 Minutes Intravenous Every 8 hours 11/29/24 0718 11/29/24 1046   11/29/24 0730  piperacillin -tazobactam (ZOSYN ) IVPB 3.375 g        3.375 g 100  mL/hr over 30 Minutes Intravenous  Once 11/29/24 0718 11/29/24 0902   11/28/24 2300  cefTRIAXone  (ROCEPHIN ) 1 g in sodium chloride  0.9 % 100 mL IVPB  Status:  Discontinued        1 g 200 mL/hr over 30 Minutes Intravenous Every 24 hours 11/28/24 2227 11/29/24 0647      Subjective: Patient was seen and examined at bedside.  Overnight events noted. Patient reports feeling better, still reports having pain.   She is concerned about dizziness and palpitations.  Objective: Vitals:   11/29/24 2112 11/29/24 2339 11/30/24 0554 11/30/24 1234  BP: (!) 145/85 (!) 148/80 (!) 148/76 (!) 155/65  Pulse: 100 95 92 91  Resp: 20 (!) 21 16 20   Temp: 98 F (36.7 C) 98.1 F (36.7 C) 97.6 F (36.4 C) 98 F (36.7 C)  TempSrc:  Oral Oral Oral  SpO2: 100% 96% 99% 98%  Weight:      Height:        Intake/Output Summary (Last 24 hours) at 11/30/2024 1416 Last data filed at 11/30/2024 1000 Gross per 24 hour  Intake 600 ml  Output 25 ml  Net 575 ml   Filed Weights   11/29/24 0352  Weight: (!) 199.6 kg    Examination:  General exam: Appears calm and comfortable, not in any acute distress. Respiratory system: CTA Bilaterally . Respiratory effort normal.  RR 14 Cardiovascular system: S1 & S2 heard, RRR. No JVD, murmurs, rubs, gallops or clicks.  Gastrointestinal system: Abdomen is non distended, soft and non tender.  Normal bowel sounds heard.  Drain noted. Central nervous system: Alert and oriented x 3. No focal neurological deficits. Extremities: No edema, no cyanosis, no clubbing. Skin: No rashes, lesions or ulcers Psychiatry: Judgement and insight appear normal. Mood & affect appropriate.     Data Reviewed: I have personally reviewed following labs and imaging studies  CBC: Recent Labs  Lab 11/28/24 1817 11/29/24 0109 11/30/24 0407  WBC 11.8* 13.1* 8.0  NEUTROABS 9.5*  --   --   HGB 10.1* 10.2* 9.2*  HCT 32.4* 34.0* 30.4*  MCV 82.2 85.0 84.0  PLT PLATELET CLUMPS NOTED ON SMEAR,  COUNT APPEARS ADEQUATE 415* 373   Basic Metabolic Panel: Recent Labs  Lab 11/28/24 1817 11/28/24 2234 11/29/24 0109 11/30/24 0407  NA 138  --  137 135  K 3.6  --  3.6 4.0  CL 110  --  105 102  CO2 20*  --  19* 21*  GLUCOSE 130*  --  176* 151*  BUN 16  --  17 21*  CREATININE 0.81  --  0.93 0.74  CALCIUM  7.2*  --  8.0* 9.4  MG  --  1.5*  --  1.9  PHOS  --   --   --  4.6   GFR: Estimated Creatinine Clearance: 149.7 mL/min (by C-G formula based on SCr of 0.74 mg/dL). Liver Function Tests: Recent Labs  Lab 11/28/24 1817 11/29/24 0109 11/30/24 0407  AST 19 21  --   ALT 11 12  --   ALKPHOS 63 71  --   BILITOT 0.9  0.7  --   PROT 6.6 7.0  --   ALBUMIN 2.4* 2.7* 3.3*   No results for input(s): LIPASE, AMYLASE in the last 168 hours. No results for input(s): AMMONIA in the last 168 hours. Coagulation Profile: Recent Labs  Lab 11/29/24 0109  INR 1.8*   Cardiac Enzymes: No results for input(s): CKTOTAL, CKMB, CKMBINDEX, TROPONINI in the last 168 hours. BNP (last 3 results) No results for input(s): PROBNP in the last 8760 hours. HbA1C: Recent Labs    11/28/24 2234  HGBA1C 6.8*   CBG: Recent Labs  Lab 11/29/24 1057 11/29/24 1657 11/29/24 2113 11/30/24 0734 11/30/24 1147  GLUCAP 189* 144* 187* 191* 199*   Lipid Profile: No results for input(s): CHOL, HDL, LDLCALC, TRIG, CHOLHDL, LDLDIRECT in the last 72 hours. Thyroid Function Tests: Recent Labs    11/28/24 2234  TSH 1.935   Anemia Panel: No results for input(s): VITAMINB12, FOLATE, FERRITIN, TIBC, IRON, RETICCTPCT in the last 72 hours. Sepsis Labs: Recent Labs  Lab 11/28/24 1833 11/28/24 2156 11/28/24 2234 11/29/24 1003 11/30/24 0407  PROCALCITON  --   --  <0.10  --   --   LATICACIDVEN 2.7* 3.0*  --  3.2* 1.0    Recent Results (from the past 240 hours)  Urine Culture (for pregnant, neutropenic or urologic patients or patients with an indwelling urinary  catheter)     Status: Abnormal (Preliminary result)   Collection Time: 11/28/24 10:56 PM   Specimen: Urine, Clean Catch  Result Value Ref Range Status   Specimen Description URINE, CLEAN CATCH  Final   Special Requests ADDED 2256  Final   Culture (A)  Final    >=100,000 COLONIES/mL PROTEUS SPECIES IDENTIFICATION AND SUSCEPTIBILITIES TO FOLLOW Performed at [facility], [address] [place], [place] [address]    Report Status PENDING  Incomplete         Radiology Studies: ECHOCARDIOGRAM COMPLETE Result Date: 11/29/2024    ECHOCARDIOGRAM REPORT   Patient Name:   Serrina MERVYN MOLT Date of Exam: 11/29/2024 Medical Rec #:  [id number]            Height:       64.0 in Accession #:    [id number]           Weight:       440.0 lb Date of Birth:  04-Apr-1976            BSA:          2.735 m Patient Age:    48 years             BP:           153/90 mmHg Patient Gender: F                    HR:           95 bpm. Exam Location:  Inpatient Procedure: 2D Echo and Intracardiac Opacification Agent (Both Spectral and Color            Flow Doppler were utilized during procedure). Indications:    Sycope  History:        Patient has prior history of Echocardiogram examinations.  Sonographer:    Norleen Amour Referring Phys: [id number] SARA-MAIZ A THOMAS IMPRESSIONS  1. Left ventricular ejection fraction, by estimation, is 70 to 75%. Left ventricular ejection fraction by 2D MOD biplane is 73.8 %. The left ventricle has hyperdynamic function. The left ventricle has no regional wall motion abnormalities.  There is mild  left ventricular hypertrophy. Left ventricular diastolic parameters are consistent with Grade I diastolic dysfunction (impaired relaxation).  2. Right ventricular systolic function is normal. The right ventricular size is normal.  3. The mitral valve is degenerative. Trivial mitral valve regurgitation.  4. The aortic valve was not well visualized. Aortic valve regurgitation is not visualized. Aortic  valve sclerosis/calcification is present, without any evidence of aortic stenosis. Aortic valve mean gradient measures 6.0 mmHg.  5. The inferior vena cava is normal in size with greater than 50% respiratory variability, suggesting right atrial pressure of 3 mmHg. Comparison(s): Changes from prior study are noted. 08/12/2024: LVEF 60-65%. FINDINGS  Left Ventricle: Left ventricular ejection fraction, by estimation, is 70 to 75%. Left ventricular ejection fraction by 2D MOD biplane is 73.8 %. The left ventricle has hyperdynamic function. The left ventricle has no regional wall motion abnormalities. The left ventricular internal cavity size was normal in size. There is mild left ventricular hypertrophy. Left ventricular diastolic parameters are consistent with Grade I diastolic dysfunction (impaired relaxation). Indeterminate filling pressures. Right Ventricle: The right ventricular size is normal. No increase in right ventricular wall thickness. Right ventricular systolic function is normal. Left Atrium: Left atrial size was normal in size. Right Atrium: Right atrial size was normal in size. Pericardium: There is no evidence of pericardial effusion. Mitral Valve: The mitral valve is degenerative in appearance. Mild mitral annular calcification. Trivial mitral valve regurgitation. MV peak gradient, 4.2 mmHg. The mean mitral valve gradient is 2.0 mmHg. Tricuspid Valve: The tricuspid valve is grossly normal. Tricuspid valve regurgitation is trivial. Aortic Valve: The aortic valve was not well visualized. Aortic valve regurgitation is not visualized. Aortic valve sclerosis/calcification is present, without any evidence of aortic stenosis. Aortic valve mean gradient measures 6.0 mmHg. Aortic valve peak gradient measures 10.5 mmHg. Aortic valve area, by VTI measures 3.50 cm. Pulmonic Valve: The pulmonic valve was grossly normal. Pulmonic valve regurgitation is trivial. Aorta: The aortic root and ascending aorta are  structurally normal, with no evidence of dilitation. Venous: The inferior vena cava is normal in size with greater than 50% respiratory variability, suggesting right atrial pressure of 3 mmHg. IAS/Shunts: No atrial level shunt detected by color flow Doppler.  LEFT VENTRICLE PLAX 2D                        Biplane EF (MOD) LVIDd:         5.00 cm         LV Biplane EF:   Left LVIDs:         3.20 cm                          ventricular LV PW:         1.20 cm                          ejection LV IVS:        0.80 cm                          fraction by LVOT diam:     2.10 cm                          2D MOD LV SV:         89  biplane is LV SV Index:   33                               73.8 %. LVOT Area:     3.46 cm                                Diastology                                LV e' medial:    7.29 cm/s LV Volumes (MOD)               LV E/e' medial:  14.0 LV vol d, MOD    61.7 ml       LV e' lateral:   7.62 cm/s A2C:                           LV E/e' lateral: 13.4 LV vol d, MOD    152.0 ml A4C: LV vol s, MOD    24.6 ml A2C: LV vol s, MOD    30.2 ml A4C: LV SV MOD A2C:   37.1 ml LV SV MOD A4C:   152.0 ml LV SV MOD BP:    76.5 ml RIGHT VENTRICLE RV Basal diam:  2.90 cm RV S prime:     25.00 cm/s LEFT ATRIUM             Index        RIGHT ATRIUM           Index LA diam:        3.80 cm 1.39 cm/m   RA Area:     13.80 cm LA Vol (A2C):   45.0 ml 16.45 ml/m  RA Volume:   31.40 ml  11.48 ml/m LA Vol (A4C):   40.5 ml 14.81 ml/m LA Biplane Vol: 43.4 ml 15.87 ml/m  AORTIC VALVE                     PULMONIC VALVE AV Area (Vmax):    2.50 cm      PV Vmax:       1.82 m/s AV Area (Vmean):   2.46 cm      PV Peak grad:  13.2 mmHg AV Area (VTI):     3.50 cm AV Vmax:           162.00 cm/s AV Vmean:          118.000 cm/s AV VTI:            0.255 m AV Peak Grad:      10.5 mmHg AV Mean Grad:      6.0 mmHg LVOT Vmax:         117.00 cm/s LVOT Vmean:        83.800 cm/s LVOT VTI:          0.258 m  LVOT/AV VTI ratio: 1.01  AORTA Ao Root diam: 2.70 cm Ao Asc diam:  3.40 cm MITRAL VALVE MV Area (PHT): 6.54 cm     SHUNTS MV Area VTI:   4.67 cm     Systemic VTI:  0.26 m MV Peak grad:  4.2 mmHg     Systemic Diam: 2.10 cm MV Mean grad:  2.0 mmHg MV Vmax:  1.03 m/s MV Vmean:      67.2 cm/s MV Decel Time: 116 msec MV E velocity: 102.00 cm/s MV A velocity: 106.00 cm/s MV E/A ratio:  0.96 Vinie Maxcy MD Electronically signed by Vinie Maxcy MD Signature Date/Time: 11/29/2024/5:10:08 PM    Final    CT Angio Chest Pulmonary Embolism (PE) W or WO Contrast Result Date: 11/29/2024 CLINICAL DATA:  Syncope. EXAM: CT ANGIOGRAPHY CHEST WITH CONTRAST TECHNIQUE: Multidetector CT imaging of the chest was performed using the standard protocol during bolus administration of intravenous contrast. Multiplanar CT image reconstructions and MIPs were obtained to evaluate the vascular anatomy. RADIATION DOSE REDUCTION: This exam was performed according to the departmental dose-optimization program which includes automated exposure control, adjustment of the mA and/or kV according to patient size and/or use of iterative reconstruction technique. CONTRAST:  75mL OMNIPAQUE  IOHEXOL  350 MG/ML SOLN COMPARISON:  04/08/2022. FINDINGS: Cardiovascular: Image quality is degraded by respiratory motion, limiting evaluation of the segmental and subsegmental pulmonary arteries. No central or lobar pulmonary embolus. Atherosclerotic calcification of the aorta. Enlarged pulmonic trunk and heart. No pericardial effusion. Mediastinum/Nodes: No pathologically enlarged mediastinal, hilar or axillary lymph nodes. No pathologically enlarged mediastinal, hilar or axillary lymph nodes. Esophagus is grossly unremarkable. Distal periesophageal lymph nodes are not enlarged by CT size criteria. Lungs/Pleura: Image quality is degraded by expiratory phase imaging and respiratory motion. No pleural fluid. Airway is otherwise unremarkable. Upper Abdomen: Liver  margin is irregular. Small right adrenal adenoma. No specific follow-up necessary. Visualized portions of the liver, adrenal glands, left kidney, spleen, pancreas, stomach and bowel are otherwise grossly unremarkable. No upper abdominal adenopathy. Musculoskeletal: Degenerative changes in the spine. Review of the MIP images confirms the above findings. IMPRESSION: 1. Image quality is degraded by respiratory motion, limiting the evaluation of segmental and subsegmental pulmonary arteries. No central or lobar pulmonary embolus. 2. Cirrhosis. 3. Small right adrenal adenoma. 4.  Aortic atherosclerosis (ICD10-I70.0). 5. Enlarged pulmonic trunk, indicative of pulmonary arterial hypertension. Electronically Signed   By: Newell Eke M.D.   On: 11/29/2024 09:04   CT ABDOMEN PELVIS W CONTRAST Result Date: 11/28/2024 EXAM: CT ABDOMEN AND PELVIS WITH CONTRAST 11/28/2024 08:30:00 PM TECHNIQUE: CT of the abdomen and pelvis was performed with the administration of 100 mL of iohexol  (OMNIPAQUE ) 350 MG/ML injection. Multiplanar reformatted images are provided for review. Automated exposure control, iterative reconstruction, and/or weight-based adjustment of the mA/kV was utilized to reduce the radiation dose to as low as reasonably achievable. COMPARISON: 11/17/2024, 08/22/2024, 04/16/2022. CLINICAL HISTORY: Abdominal pain, acute, nonlocalized; hx hepatic abscess. FINDINGS: LOWER CHEST: Mitral annular calcification. LIVER: The liver is enlarged, measuring up to 21 cm. Redemonstration of the grossly stable multiloculated partially calcified chronic 13 x 7 cm right inferior hepatic lobe subhepatic gas and fluid complex lesion. Findings abuts the hepatic flexure. A surgical drain with a pigtail within the collection is again noted. GALLBLADDER AND BILE DUCTS: Gallbladder is unremarkable. No biliary ductal dilatation. SPLEEN: The spleen is enlarged, measuring up to 14 cm. PANCREAS: No acute abnormality. ADRENAL GLANDS: No acute  abnormality. KIDNEYS, URETERS AND BLADDER: No stones in the kidneys or ureters. No hydronephrosis. No perinephric or periureteral stranding. No filling defects of the partially visualized collecting systems on delayed imaging. Urinary bladder is unremarkable. GI AND BOWEL: Stomach demonstrates no acute abnormality. No small or large bowel thickening or dilatation. The appendix is unremarkable. Stool throughout the ascending colon and proximal to mid transverse colon. There is no bowel obstruction. PERITONEUM AND RETROPERITONEUM: No ascites. No  free air. VASCULATURE: Aorta is normal in caliber. Mild atherosclerotic plaque. LYMPH NODES: No lymphadenopathy. REPRODUCTIVE ORGANS: The uterus and bilateral adnexal regions are unremarkable. BONES AND SOFT TISSUES: No acute osseous abnormality. No focal soft tissue abnormality. IMPRESSION: 1. Grossly stable multiloculated partially calcified chronic 13 x 7 cm right inferior hepatic lobe subhepatic gas and fluid collection with an indwelling surgical drain. Collection abuts the hepatic flexure. 2. Hepatosplenomegaly. Electronically signed by: Morgane Naveau MD 11/28/2024 09:07 PM EST RP Workstation: [id number]   DG Chest Port 1 View Result Date: 11/28/2024 CLINICAL DATA:  Syncope EXAM: PORTABLE CHEST 1 VIEW COMPARISON:  08/12/2024 FINDINGS: The heart size and mediastinal contours are within normal limits. Both lungs are clear. The visualized skeletal structures are unremarkable. IMPRESSION: No active disease. Electronically Signed   By: Ozell Daring M.D.   On: 11/28/2024 18:35   Scheduled Meds:  amoxicillin -clavulanate  1 tablet Oral BID   insulin  aspart  0-9 Units Subcutaneous TID WC   rivaroxaban   20 mg Oral Q supper   Continuous Infusions:   LOS: 2 days    Time spent: 50 Mins    Darcel Dawley, MD [facility]   If 7PM-7AM, please contact night-coverage

## 2024-11-30 NOTE — Progress Notes (Signed)
 Patient ID: Susan Warren, female   DOB: 02-18-1976, 48 y.o.   MRN: [id number] Pt to be scheduled for OP f/u at IR drain clinic following hospital discharge targeting last week in Dec. ID team prefers to wait for completion of antibiotic therapy before f/u scan. Pt aware of plans.

## 2024-11-30 NOTE — Evaluation (Signed)
 Occupational Therapy Evaluation Patient Details Name: Susan Warren MRN: [id number] DOB: 02-11-1976 Today's Date: 11/30/2024   History of Present Illness   Pt is 48 yo female admitted 11/28/24 after syncopal episode. Pt with hx including but not limited to DVT, GERD, HTN, lymphedema bil LE, migraines, morbid obexity with BMI>70, PE, hospitalization in Aug 25 for hepatic cyst with drain placement     Clinical Impressions PTA, patient lives at home with son and was independent without AD for amb and works from home. Currently, patient presents with deficits outlined below (see OT Problem List for details) most significantly decreased activity tolerance, balance, pain and functional reach due to increased body habitus and LB edema limiting ADL's and functional mobility performance. Initiated education on ECT principles for slow position changes, use of reacher and shower seat for falls prevention. Likely, will not require follow up OT services once discharged from hospital with family support/assist. Patient requires continued Acute care hospital level OT services to progress safety and functional performance and allow for discharge.       If plan is discharge home, recommend the following:   A little help with walking and/or transfers;A little help with bathing/dressing/bathroom;Assistance with cooking/housework;Assist for transportation;Help with stairs or ramp for entrance     Functional Status Assessment   Patient has had a recent decline in their functional status and demonstrates the ability to make significant improvements in function in a reasonable and predictable amount of time.     Equipment Recommendations   Tub/shower seat;Other (comment) (OT to provide handout for obtaining shower seat)      Precautions/Restrictions   Precautions Precautions: Fall Precaution/Restrictions Comments: watch BP Restrictions Weight Bearing Restrictions Per Provider Order: No      Mobility Bed Mobility Overal bed mobility: Needs Assistance Bed Mobility: Supine to Sit, Sit to Supine     Supine to sit: Supervision, Used rails, HOB elevated Sit to supine: Supervision, HOB elevated, Used rails   General bed mobility comments: issued and trained in gait belt use for R Le management and teach back without assist    Transfers Overall transfer level: Needs assistance Equipment used: None Transfers: Sit to/from Stand, Bed to chair/wheelchair/BSC Sit to Stand: Supervision     Step pivot transfers: Supervision     General transfer comment: amb to and from bathroom with supervision due to reports of lightheadedness but VS remained stable see below, educated on slow postion changes especially from lower toilet      Balance Overall balance assessment: Needs assistance Sitting-balance support: No upper extremity supported Sitting balance-Leahy Scale: Good     Standing balance support: No upper extremity supported Standing balance-Leahy Scale: Fair                             ADL either performed or assessed with clinical judgement   ADL Overall ADL's : Modified independent                                       General ADL Comments: intiated education for AE and DME for LB and out of reach items to decrease falls/dizziness risk, initiated ECT principles     Vision Baseline Vision/History: 0 No visual deficits;1 Wears glasses Ability to See in Adequate Light: 0 Adequate Patient Visual Report: No change from baseline  Pertinent Vitals/Pain Pain Assessment Pain Assessment: No/denies pain (pain meds given last night and back pain resolved in new bari bed)     Extremity/Trunk Assessment Upper Extremity Assessment Upper Extremity Assessment: Right hand dominant;Overall WFL for tasks assessed   Lower Extremity Assessment Lower Extremity Assessment: Defer to PT evaluation   Cervical / Trunk Assessment Cervical  / Trunk Assessment: Normal   Communication Communication Communication: No apparent difficulties   Cognition Arousal: Alert Behavior During Therapy: WFL for tasks assessed/performed Cognition: No apparent impairments                               Following commands: Intact       Cueing  General Comments   Cueing Techniques: Verbal cues  increased body habitus with lymphedema B LE's and some discoloration noted, educated on ECT and slow position changes, VSR: BP 112/70 and HR 98 EOB seated, amb to and from bathroom with BP 138/88 and HR 99; SpO2 remained 99% on RA    Home Living Family/patient expects to be discharged to:: Private residence Living Arrangements: Children Available Help at Discharge: Family;Available 24 hours/day Type of Home: House Home Access: Stairs to enter [employer] of Steps: 2 Entrance Stairs-Rails: Right;Left;Can reach both Home Layout: One level     Bathroom Shower/Tub: Chief Strategy Officer: Handicapped height     Home Equipment: Cane - single point;Adaptive equipment Adaptive Equipment: Reacher Additional Comments: lives with son      Prior Functioning/Environment Prior Level of Function : Independent/Modified Independent;Working/employed;Driving             Mobility Comments: Could ambulate in community without AD ADLs Comments: Pt is ind with ADL's and works from home.    OT Problem List: Decreased activity tolerance;Impaired balance (sitting and/or standing);Cardiopulmonary status limiting activity;Obesity;Pain;Increased edema   OT Treatment/Interventions: Self-care/ADL training;Therapeutic exercise;Energy conservation;DME and/or AE instruction;Therapeutic activities;Patient/family education;Balance training      OT Goals(Current goals can be found in the care plan section)   Acute Rehab OT Goals Patient Stated Goal: to feel less dizzy OT Goal Formulation: With patient Time For Goal  Achievement: 12/14/24 Potential to Achieve Goals: Good ADL Goals Pt Will Perform Lower Body Bathing: with modified independence;with adaptive equipment;sit to/from stand Pt Will Perform Lower Body Dressing: with modified independence;with adaptive equipment;sit to/from stand Pt Will Transfer to Toilet: with modified independence;ambulating;regular height toilet Pt Will Perform Toileting - Clothing Manipulation and hygiene: with modified independence;sitting/lateral leans Pt Will Perform Tub/Shower Transfer: Tub transfer;with modified independence;shower seat Pt/caregiver will Perform Home Exercise Program: Both right and left upper extremity;With written HEP provided;Independently Additional ADL Goal #1: patient will teach back indep 5/5 ECT strategies including need for slow position changes, use of AD and shower bench   OT Frequency:  Min 2X/week       AM-PAC OT 6 Clicks Daily Activity     Outcome Measure Help from another person eating meals?: None Help from another person taking care of personal grooming?: None Help from another person toileting, which includes using toliet, bedpan, or urinal?: None Help from another person bathing (including washing, rinsing, drying)?: None Help from another person to put on and taking off regular upper body clothing?: None Help from another person to put on and taking off regular lower body clothing?: None 6 Click Score: 24   End of Session Equipment Utilized During Treatment: Gait belt Nurse Communication: Mobility status;Other (comment) (encouraged recliner use as able)  Activity Tolerance: Patient tolerated treatment well Patient left: in bed;with call bell/phone within reach;with bed alarm set  OT Visit Diagnosis: Unsteadiness on feet (R26.81);Pain Pain - part of body:  (back)                Time: 9044-8964 OT Time Calculation (min): 40 min Charges:  OT General Charges $OT Visit: 1 Visit OT Evaluation $OT Eval Low Complexity: 1  Low OT Treatments $Self Care/Home Management : 8-22 mins Thula Stewart OT/L Acute Rehabilitation Department  [phone]  11/30/2024, 10:56 AM

## 2024-11-30 NOTE — Telephone Encounter (Signed)
 Copied from CRM [id number]. Topic: Clinical - Medication Question >> Nov 30, 2024  3:58 PM Winona R wrote:  Pt is currently schedule for 12/11 however she is also currently in the hospital with an unknown discharge date. Pt would like to know if she cancel her appointment  will she still be able to get a refill on her rx XARELTO  20 MG TABS tablet. Please inform pt of next step moving forward

## 2024-12-01 ENCOUNTER — Inpatient Hospital Stay (HOSPITAL_COMMUNITY)

## 2024-12-01 LAB — CBC
HCT: 31.2 % — ABNORMAL LOW (ref 36.0–46.0)
Hemoglobin: 9.2 g/dL — ABNORMAL LOW (ref 12.0–15.0)
MCH: 25.6 pg — ABNORMAL LOW (ref 26.0–34.0)
MCHC: 29.5 g/dL — ABNORMAL LOW (ref 30.0–36.0)
MCV: 86.7 fL (ref 80.0–100.0)
Platelets: 373 K/uL (ref 150–400)
RBC: 3.6 MIL/uL — ABNORMAL LOW (ref 3.87–5.11)
RDW: 15.3 % (ref 11.5–15.5)
WBC: 8.1 K/uL (ref 4.0–10.5)
nRBC: 0 % (ref 0.0–0.2)

## 2024-12-01 LAB — GLUCOSE, CAPILLARY
Glucose-Capillary: 178 mg/dL — ABNORMAL HIGH (ref 70–99)
Glucose-Capillary: 194 mg/dL — ABNORMAL HIGH (ref 70–99)
Glucose-Capillary: 159 mg/dL — ABNORMAL HIGH (ref 70–99)
Glucose-Capillary: 137 mg/dL — ABNORMAL HIGH (ref 70–99)

## 2024-12-01 LAB — BASIC METABOLIC PANEL WITH GFR
Anion gap: 12 (ref 5–15)
BUN: 18 mg/dL (ref 6–20)
CO2: 21 mmol/L — ABNORMAL LOW (ref 22–32)
Calcium: 8.9 mg/dL (ref 8.9–10.3)
Chloride: 103 mmol/L (ref 98–111)
Creatinine, Ser: 0.84 mg/dL (ref 0.44–1.00)
GFR, Estimated: >60 mL/min (ref >60)
Glucose, Bld: 185 mg/dL — ABNORMAL HIGH (ref 70–99)
Potassium: 4 mmol/L (ref 3.5–5.1)
Sodium: 136 mmol/L (ref 135–145)

## 2024-12-01 LAB — PHOSPHORUS: Phosphorus: 4.2 mg/dL (ref 2.5–4.6)

## 2024-12-01 LAB — URINE CULTURE: Culture: >=100000 — AB

## 2024-12-01 LAB — MAGNESIUM: Magnesium: 2 mg/dL (ref 1.7–2.4)

## 2024-12-01 MED ORDER — MELATONIN 5 MG PO TABS: 5.0000 mg | ORAL_TABLET | Freq: Once | ORAL | Status: DC

## 2024-12-01 MED ORDER — MELATONIN 5 MG PO TABS
5.0000 mg | ORAL_TABLET | Freq: Every evening | ORAL | Status: DC | PRN
  Administered 2024-12-02 – 2024-12-04 (×3): 5 mg via ORAL
  Filled 2024-12-01 (×4): qty 1

## 2024-12-01 NOTE — Progress Notes (Signed)
°   12/01/24 0848  TOC Brief Assessment  Insurance and Status Reviewed  Patient has primary care physician Yes  Home environment has been reviewed home with [place]  Prior level of function: independent  Prior/Current Home Services No current home services  Social Drivers of Health Review SDOH reviewed no interventions necessary  Readmission risk has been reviewed Yes  Transition of care needs no transition of care needs at this time

## 2024-12-01 NOTE — Progress Notes (Signed)
 Physical Therapy Treatment Patient Details Name: Susan Warren MRN: [id number] DOB: 1976-03-21 Today's Date: 12/01/2024   History of Present Illness Pt is 48 yo female admitted 11/28/24 after syncopal episode. Pt with hx including but not limited to DVT, GERD, HTN, lymphedema bil LE, migraines, morbid obexity with BMI>70, PE, hospitalization in Aug 25 for hepatic cyst with drain placement    PT Comments  Pt is AxO x 3 pleasant and willing.  Assisted OOB to bed to amb was limited by dizziness.  General Gait Details: Pt was able to amb in hallway but needed to hold rail for min support.  Pt reported increased dizziness with increased distance.  HR increased from 91 at rest to 130.  Unable to record a BP due to movement and need to sit down.  Pt reports she starts to feel diizy, then hot, then vision goes dark.  She also stated she gets cold/clammy).  Required x 4 standing rest break against the wall.  Pt stated, normally I can go where ever with no problems.  Pt appears to be a [employer].  Pt plans to return home when medically stable.  LPT has NO post Acute recommendations.  Pt plans to return to work (at home).    If plan is discharge home, recommend the following: Assist for transportation   Can travel by private vehicle        Equipment Recommendations  None recommended by PT    Recommendations for Other Services       Precautions / Restrictions Precautions Precautions: Fall Precaution/Restrictions Comments: watch BP Restrictions Weight Bearing Restrictions Per Provider Order: No     Mobility  Bed Mobility Overal bed mobility: Modified Independent             General bed mobility comments: self able with HOB elevated and use of rails.  Also uses a strap to lift R LE up back onto bed.    Transfers Overall transfer level: Modified independent Equipment used: None Transfers: Sit to/from Stand Sit to Stand: Supervision           General  transfer comment: Pt self able to rise and lower no AD from elevated surface with forward weight shift due to body habitus.  Good safety awareness.    Ambulation/Gait Ambulation/Gait assistance: Supervision Gait Distance (Feet): 24 Feet Assistive device: None Gait Pattern/deviations: Step-through pattern, Decreased stride length, Wide base of support Gait velocity: decreased     General Gait Details: Pt was able to amb in hallway but needed to hold rail for min support.  Pt reported increased dizziness with increased distance.  HR increased from 91 at rest to 130.  Unable to record a BP due to movement and need to sit down.  Pt reports she starts to feel diizy, then hot, then vision goes dark.  She also stated she gets cold/clammy).  Required x 4 standing rest break against the wall.  Pt stated, normally I can go where ever with no problems.  Pt appears to be a [employer].   Stairs             Wheelchair Mobility     Tilt Bed    Modified Rankin (Stroke Patients Only)       Balance  Communication Communication Communication: No apparent difficulties  Cognition Arousal: Alert Behavior During Therapy: WFL for tasks assessed/performed   PT - Cognitive impairments: No apparent impairments                       PT - Cognition Comments: AxO x 3 pleasant and willing.  Lives home alone and works from home. Following commands: Intact      Cueing Cueing Techniques: Verbal cues  Exercises      General Comments        Pertinent Vitals/Pain Pain Assessment Pain Assessment: No/denies pain    Home Living                          Prior Function            PT Goals (current goals can now be found in the care plan section) Progress towards PT goals: Progressing toward goals    Frequency    Min 2X/week      PT Plan      Co-evaluation              AM-PAC PT  6 Clicks Mobility   Outcome Measure  Help needed turning from your back to your side while in a flat bed without using bedrails?: None Help needed moving from lying on your back to sitting on the side of a flat bed without using bedrails?: None Help needed moving to and from a bed to a chair (including a wheelchair)?: None Help needed standing up from a chair using your arms (e.g., wheelchair or bedside chair)?: None Help needed to walk in hospital room?: A Little Help needed climbing 3-5 steps with a railing? : A Little 6 Click Score: 22    End of Session Equipment Utilized During Treatment: Gait belt Activity Tolerance: Patient limited by fatigue;Other (comment) (dizziness) Patient left: in bed;with call bell/phone within reach Nurse Communication: Mobility status PT Visit Diagnosis: Other abnormalities of gait and mobility (R26.89);Dizziness and giddiness (R42)     Time: 8553-8497 PT Time Calculation (min) (ACUTE ONLY): 16 min  Charges:    $Gait Training: 8-22 mins PT General Charges $$ ACUTE PT VISIT: 1 Visit                     Katheryn Leap  PTA Acute  Rehabilitation Services Office M-F          [phone]

## 2024-12-01 NOTE — Progress Notes (Signed)
 PROGRESS NOTE    Susan Warren  FMW:[id number] DOB: June 29, 1976 DOA: 11/28/2024 PCP: Tanda Bleacher, MD    Brief Narrative:  This 48 years old Female with PMH significant for morbid obesity, lymphedema, DVT/PE on Xarelto , HTN, GERD, migraine headache, hepatic abscess s/p drain placement currently on Augmentin  presenting with syncopal episodes and palpitations.   Patient has had intermittent near syncope over the last 2 months.  She lost consciousness while on commode on the day of presentation.  Denies fall.   In ED, HR ranges from 112-138.  WBC 11.8 with left shift.  CMP without significant finding.  Lactic acid 2.7.  UA with pyuria, microscopic hematuria and bacteria.  Pro-Cal negative.  Serial troponin and BNP negative.  EKG showed sinus tachycardia to 110.  CT angio chest without large central PE but suggested cirrhosis and possible PAH.  CT A/P with grossly stable multiloculated partially calcified chronic 13 x 7 cm right inferior hepatic lobe subhepatic gas and fluid collection with an indwelling surgical drain, and hepatosplenomegaly.  Urine culture sent.  Cardiogram ordered.  Started on IV Zosyn  and admitted  Assessment & Plan:   Principal Problem:   Syncope   Possible syncope:  Patient reports 2 months of intermittent palpitations and near syncope.   Reports LOC while on commode one day PTA.  No fall.   Continues to endorse lightheadedness and palpitations with exertion.   Denies chest pain or dyspnea.   CTA chest negative for central PE but suggested possible liver cirrhosis and PAH.   EKG with mild sinus tachycardia at 110.  Serial troponin and BNP normal.  Low suspicion for infection. Continue telemetry monitoring Echo shows LVEF 70 to 75% hyperdynamic function.  No RWMA. Patient reports the dizzy spells are bothersome,  she wants to find out. Obtain CT head to rule out any intracranial abnormality. Obtain Orthostatic vitals. PT/OT eval   Hx of DVT/PE:  Reports  compliance with Xarelto .  CT angio chest with no central PE. Continue home Xarelto .   Pyuria / bacteriuria /microscopic hematuria:  Patient denies UTI symptoms, fever or abdominal pain.   Started on IV Zosyn  on admission.  - Discontinue IV Zosyn  - Resume home Augmentin .   Hepatic abscess s/p drain insertion:  Patient reports minimal drainage from JP drain.  CT abdomen and pelvis as above.  -Resume home Augmentin . -Discussed with ID, and IR  Continue with the drain.   She has a drain clinic Appt with follow up scan on 12/10/24.   Possible liver cirrhosis / hepatosplenomegaly: -Needs outpatient follow-up with GI on discharge.   Lactic acidosis: Likely dehydration.   She has leukocytosis but no fever.  Pro-Cal negative.   Continue Augmentin  for liver cirrhosis.   UA concerning but she denies UTI symptoms. Lactic acid resolved with IV hydration.   Lymphedema of b/l lower extremities -per patient at baseline no concerns.   Essential hypertension:  Normotensive for most part.  Not on meds at home.  -Continue monitoring.   NIDDM-2 with hyperglycemia:  HbA1c 6.8%.  Not on meds at home. - Continue SSI-sensitive.   Hypomagnesemia: Replaced.  Continue to monitor   Morbid obesity: Body mass index is 75.53 kg/m. Diet and exercise discussed in detail.  DVT prophylaxis: Xarelto  Code Status: Full code Family Communication: No family at bed side. Disposition Plan:    Status is: Inpatient Remains inpatient appropriate because:   Admitted for Syncope , workup in progress.   Consultants:  IR ID  Procedures:None  Antimicrobials:  Anti-infectives (From admission,  onward)    Start     Dose/Rate Route Frequency Ordered Stop   11/29/24 2000  amoxicillin -clavulanate (AUGMENTIN ) 875-125 MG per tablet 1 tablet        1 tablet Oral 2 times daily 11/29/24 1046     11/29/24 1400  piperacillin -tazobactam (ZOSYN ) IVPB 3.375 g  Status:  Discontinued        3.375 g 12.5 mL/hr over 240  Minutes Intravenous Every 8 hours 11/29/24 0718 11/29/24 1046   11/29/24 0730  piperacillin -tazobactam (ZOSYN ) IVPB 3.375 g        3.375 g 100 mL/hr over 30 Minutes Intravenous  Once 11/29/24 0718 11/29/24 0902   11/28/24 2300  cefTRIAXone  (ROCEPHIN ) 1 g in sodium chloride  0.9 % 100 mL IVPB  Status:  Discontinued        1 g 200 mL/hr over 30 Minutes Intravenous Every 24 hours 11/28/24 2227 11/29/24 0647      Subjective: Patient was seen and examined at bedside.  Overnight events noted. Patient reports still having intermittent dizziness and she is frustrated with dizziness. She wants to rule out any problem in the head.  Objective: Vitals:   11/30/24 0554 11/30/24 1234 11/30/24 2020 12/01/24 0443  BP: (!) 148/76 (!) 155/65 139/71 (!) 144/61  Pulse: 92 91 90 93  Resp: 16 20 (!) 24 20  Temp: 97.6 F (36.4 C) 98 F (36.7 C) 98.3 F (36.8 C) 98.4 F (36.9 C)  TempSrc: Oral Oral Oral Oral  SpO2: 99% 98% 97% 98%  Weight:      Height:        Intake/Output Summary (Last 24 hours) at 12/01/2024 1131 Last data filed at 12/01/2024 0950 Gross per 24 hour  Intake 1080 ml  Output 20 ml  Net 1060 ml   Filed Weights   11/29/24 0352  Weight: (!) 199.6 kg    Examination:  General exam: Appears calm and comfortable, not in any acute distress. Respiratory system: CTA Bilaterally. Respiratory effort normal.  RR 16 Cardiovascular system: S1 & S2 heard, RRR. No JVD, murmurs, rubs, gallops or clicks.  Gastrointestinal system: Abdomen is non distended, soft and non tender.  Normal bowel sounds heard.  Drain noted. Central nervous system: Alert and oriented x 3. No focal neurological deficits. Extremities: No edema, no cyanosis, no clubbing. Skin: No rashes, lesions or ulcers Psychiatry: Judgement and insight appear normal. Mood & affect appropriate.     Data Reviewed: I have personally reviewed following labs and imaging studies  CBC: Recent Labs  Lab 11/28/24 1817 11/29/24 0109  11/30/24 0407 12/01/24 0419  WBC 11.8* 13.1* 8.0 8.1  NEUTROABS 9.5*  --   --   --   HGB 10.1* 10.2* 9.2* 9.2*  HCT 32.4* 34.0* 30.4* 31.2*  MCV 82.2 85.0 84.0 86.7  PLT PLATELET CLUMPS NOTED ON SMEAR, COUNT APPEARS ADEQUATE 415* 373 373   Basic Metabolic Panel: Recent Labs  Lab 11/28/24 1817 11/28/24 2234 11/29/24 0109 11/30/24 0407 12/01/24 0419  NA 138  --  137 135 136  K 3.6  --  3.6 4.0 4.0  CL 110  --  105 102 103  CO2 20*  --  19* 21* 21*  GLUCOSE 130*  --  176* 151* 185*  BUN 16  --  17 21* 18  CREATININE 0.81  --  0.93 0.74 0.84  CALCIUM  7.2*  --  8.0* 9.4 8.9  MG  --  1.5*  --  1.9 2.0  PHOS  --   --   --  4.6 4.2   GFR: Estimated Creatinine Clearance: 142.6 mL/min (by C-G formula based on SCr of 0.84 mg/dL). Liver Function Tests: Recent Labs  Lab 11/28/24 1817 11/29/24 0109 11/30/24 0407  AST 19 21  --   ALT 11 12  --   ALKPHOS 63 71  --   BILITOT 0.9 0.7  --   PROT 6.6 7.0  --   ALBUMIN 2.4* 2.7* 3.3*   No results for input(s): LIPASE, AMYLASE in the last 168 hours. No results for input(s): AMMONIA in the last 168 hours. Coagulation Profile: Recent Labs  Lab 11/29/24 0109  INR 1.8*   Cardiac Enzymes: No results for input(s): CKTOTAL, CKMB, CKMBINDEX, TROPONINI in the last 168 hours. BNP (last 3 results) No results for input(s): PROBNP in the last 8760 hours. HbA1C: Recent Labs    11/28/24 2234  HGBA1C 6.8*   CBG: Recent Labs  Lab 11/30/24 1147 11/30/24 1609 11/30/24 2016 12/01/24 0748 12/01/24 1119  GLUCAP 199* 183* 178* 178* 194*   Lipid Profile: No results for input(s): CHOL, HDL, LDLCALC, TRIG, CHOLHDL, LDLDIRECT in the last 72 hours. Thyroid Function Tests: Recent Labs    11/28/24 2234  TSH 1.935   Anemia Panel: No results for input(s): VITAMINB12, FOLATE, FERRITIN, TIBC, IRON, RETICCTPCT in the last 72 hours. Sepsis Labs: Recent Labs  Lab 11/28/24 1833 11/28/24 2156  11/28/24 2234 11/29/24 1003 11/30/24 0407  PROCALCITON  --   --  <0.10  --   --   LATICACIDVEN 2.7* 3.0*  --  3.2* 1.0    Recent Results (from the past 240 hours)  Urine Culture (for pregnant, neutropenic or urologic patients or patients with an indwelling urinary catheter)     Status: Abnormal   Collection Time: 11/28/24 10:56 PM   Specimen: Urine, Clean Catch  Result Value Ref Range Status   Specimen Description URINE, CLEAN CATCH  Final   Special Requests   Final    ADDED 2256 Performed at [facility], [address], [place], [place] [address]    Culture >=100,000 COLONIES/mL PROTEUS PENNERI (A)  Final   Report Status 12/01/2024 FINAL  Final   Organism ID, Bacteria PROTEUS PENNERI (A)  Final      Susceptibility   Proteus penneri - MIC*    AMPICILLIN  >=32 RESISTANT Resistant     CEFEPIME <=0.12 SENSITIVE Sensitive     ERTAPENEM <=0.12 SENSITIVE Sensitive     CEFTRIAXONE  >=64 RESISTANT Resistant     CIPROFLOXACIN <=0.06 SENSITIVE Sensitive     GENTAMICIN <=1 SENSITIVE Sensitive     NITROFURANTOIN 128 RESISTANT Resistant     TRIMETH/SULFA <=20 SENSITIVE Sensitive     AMPICILLIN /SULBACTAM >=32 RESISTANT Resistant     PIP/TAZO Value in next row Sensitive      <=4 SENSITIVEThis is a modified FDA-approved test that has been validated and its performance characteristics determined by the reporting laboratory.  This laboratory is certified under the Clinical Laboratory Improvement Amendments CLIA as qualified to perform high complexity clinical laboratory testing.    MEROPENEM Value in next row Sensitive      <=4 SENSITIVEThis is a modified FDA-approved test that has been validated and its performance characteristics determined by the reporting laboratory.  This laboratory is certified under the Clinical Laboratory Improvement Amendments CLIA as qualified to perform high complexity clinical laboratory testing.    * >=100,000 COLONIES/mL PROTEUS PENNERI    Radiology  Studies: ECHOCARDIOGRAM COMPLETE Result Date: 11/29/2024    ECHOCARDIOGRAM REPORT   Patient Name:  Lawson MERVYN MOLT Date of Exam: 11/29/2024 Medical Rec #:  [id number]            Height:       64.0 in Accession #:    [id number]           Weight:       440.0 lb Date of Birth:  [birth year]            BSA:          2.735 m Patient Age:    48 years             BP:           153/90 mmHg Patient Gender: F                    HR:           95 bpm. Exam Location:  Inpatient Procedure: 2D Echo and Intracardiac Opacification Agent (Both Spectral and Color            Flow Doppler were utilized during procedure). Indications:    Sycope  History:        Patient has prior history of Echocardiogram examinations.  Sonographer:    Norleen Amour Referring Phys: [id number] SARA-MAIZ A THOMAS IMPRESSIONS  1. Left ventricular ejection fraction, by estimation, is 70 to 75%. Left ventricular ejection fraction by 2D MOD biplane is 73.8 %. The left ventricle has hyperdynamic function. The left ventricle has no regional wall motion abnormalities. There is mild  left ventricular hypertrophy. Left ventricular diastolic parameters are consistent with Grade I diastolic dysfunction (impaired relaxation).  2. Right ventricular systolic function is normal. The right ventricular size is normal.  3. The mitral valve is degenerative. Trivial mitral valve regurgitation.  4. The aortic valve was not well visualized. Aortic valve regurgitation is not visualized. Aortic valve sclerosis/calcification is present, without any evidence of aortic stenosis. Aortic valve mean gradient measures 6.0 mmHg.  5. The inferior vena cava is normal in size with greater than 50% respiratory variability, suggesting right atrial pressure of 3 mmHg. Comparison(s): Changes from prior study are noted. 08/12/2024: LVEF 60-65%. FINDINGS  Left Ventricle: Left ventricular ejection fraction, by estimation, is 70 to 75%. Left ventricular ejection fraction by 2D MOD biplane is 73.8 %.  The left ventricle has hyperdynamic function. The left ventricle has no regional wall motion abnormalities. The left ventricular internal cavity size was normal in size. There is mild left ventricular hypertrophy. Left ventricular diastolic parameters are consistent with Grade I diastolic dysfunction (impaired relaxation). Indeterminate filling pressures. Right Ventricle: The right ventricular size is normal. No increase in right ventricular wall thickness. Right ventricular systolic function is normal. Left Atrium: Left atrial size was normal in size. Right Atrium: Right atrial size was normal in size. Pericardium: There is no evidence of pericardial effusion. Mitral Valve: The mitral valve is degenerative in appearance. Mild mitral annular calcification. Trivial mitral valve regurgitation. MV peak gradient, 4.2 mmHg. The mean mitral valve gradient is 2.0 mmHg. Tricuspid Valve: The tricuspid valve is grossly normal. Tricuspid valve regurgitation is trivial. Aortic Valve: The aortic valve was not well visualized. Aortic valve regurgitation is not visualized. Aortic valve sclerosis/calcification is present, without any evidence of aortic stenosis. Aortic valve mean gradient measures 6.0 mmHg. Aortic valve peak gradient measures 10.5 mmHg. Aortic valve area, by VTI measures 3.50 cm. Pulmonic Valve: The pulmonic valve was grossly normal. Pulmonic valve regurgitation is trivial. Aorta: The aortic root and ascending aorta are  structurally normal, with no evidence of dilitation. Venous: The inferior vena cava is normal in size with greater than 50% respiratory variability, suggesting right atrial pressure of 3 mmHg. IAS/Shunts: No atrial level shunt detected by color flow Doppler.  LEFT VENTRICLE PLAX 2D                        Biplane EF (MOD) LVIDd:         5.00 cm         LV Biplane EF:   Left LVIDs:         3.20 cm                          ventricular LV PW:         1.20 cm                          ejection LV IVS:         0.80 cm                          fraction by LVOT diam:     2.10 cm                          2D MOD LV SV:         89                               biplane is LV SV Index:   33                               73.8 %. LVOT Area:     3.46 cm                                Diastology                                LV e' medial:    7.29 cm/s LV Volumes (MOD)               LV E/e' medial:  14.0 LV vol d, MOD    61.7 ml       LV e' lateral:   7.62 cm/s A2C:                           LV E/e' lateral: 13.4 LV vol d, MOD    152.0 ml A4C: LV vol s, MOD    24.6 ml A2C: LV vol s, MOD    30.2 ml A4C: LV SV MOD A2C:   37.1 ml LV SV MOD A4C:   152.0 ml LV SV MOD BP:    76.5 ml RIGHT VENTRICLE RV Basal diam:  2.90 cm RV S prime:     25.00 cm/s LEFT ATRIUM             Index        RIGHT ATRIUM           Index LA diam:        3.80 cm 1.39 cm/m  RA Area:     13.80 cm LA Vol (A2C):   45.0 ml 16.45 ml/m  RA Volume:   31.40 ml  11.48 ml/m LA Vol (A4C):   40.5 ml 14.81 ml/m LA Biplane Vol: 43.4 ml 15.87 ml/m  AORTIC VALVE                     PULMONIC VALVE AV Area (Vmax):    2.50 cm      PV Vmax:       1.82 m/s AV Area (Vmean):   2.46 cm      PV Peak grad:  13.2 mmHg AV Area (VTI):     3.50 cm AV Vmax:           162.00 cm/s AV Vmean:          118.000 cm/s AV VTI:            0.255 m AV Peak Grad:      10.5 mmHg AV Mean Grad:      6.0 mmHg LVOT Vmax:         117.00 cm/s LVOT Vmean:        83.800 cm/s LVOT VTI:          0.258 m LVOT/AV VTI ratio: 1.01  AORTA Ao Root diam: 2.70 cm Ao Asc diam:  3.40 cm MITRAL VALVE MV Area (PHT): 6.54 cm     SHUNTS MV Area VTI:   4.67 cm     Systemic VTI:  0.26 m MV Peak grad:  4.2 mmHg     Systemic Diam: 2.10 cm MV Mean grad:  2.0 mmHg MV Vmax:       1.03 m/s MV Vmean:      67.2 cm/s MV Decel Time: 116 msec MV E velocity: 102.00 cm/s MV A velocity: 106.00 cm/s MV E/A ratio:  0.96 Vinie Maxcy MD Electronically signed by Vinie Maxcy MD Signature Date/Time: 11/29/2024/5:10:08 PM    Final     Scheduled Meds:  amoxicillin -clavulanate  1 tablet Oral BID   insulin  aspart  0-9 Units Subcutaneous TID WC   rivaroxaban   20 mg Oral Q supper   Continuous Infusions:   LOS: 3 days    Time spent: 50 Mins    Darcel Dawley, MD [facility]   If 7PM-7AM, please contact night-coverage

## 2024-12-02 ENCOUNTER — Ambulatory Visit: Admitting: Family Medicine

## 2024-12-02 DIAGNOSIS — R55 Syncope and collapse: Secondary | ICD-10-CM | POA: Diagnosis not present

## 2024-12-02 LAB — GLUCOSE, CAPILLARY
Glucose-Capillary: 177 mg/dL — ABNORMAL HIGH (ref 70–99)
Glucose-Capillary: 195 mg/dL — ABNORMAL HIGH (ref 70–99)
Glucose-Capillary: 191 mg/dL — ABNORMAL HIGH (ref 70–99)
Glucose-Capillary: 188 mg/dL — ABNORMAL HIGH (ref 70–99)

## 2024-12-02 MED ORDER — MIDODRINE HCL 5 MG PO TABS: 5.0000 mg | ORAL_TABLET | Freq: Three times a day (TID) | ORAL | Status: DC

## 2024-12-02 MED ORDER — MIDODRINE HCL 5 MG PO TABS
5.0000 mg | ORAL_TABLET | Freq: Three times a day (TID) | ORAL | Status: DC
  Administered 2024-12-02 – 2024-12-05 (×9): 5 mg via ORAL
  Filled 2024-12-02 (×9): qty 1

## 2024-12-02 MED ORDER — LOPERAMIDE HCL 2 MG PO CAPS
4.0000 mg | ORAL_CAPSULE | Freq: Three times a day (TID) | ORAL | Status: DC | PRN
  Administered 2024-12-02: 4 mg via ORAL
  Filled 2024-12-02: qty 2

## 2024-12-02 MED ORDER — SACCHAROMYCES BOULARDII 250 MG PO CAPS
250.0000 mg | ORAL_CAPSULE | Freq: Two times a day (BID) | ORAL | Status: DC
  Administered 2024-12-02 – 2024-12-05 (×7): 250 mg via ORAL
  Filled 2024-12-02 (×7): qty 1

## 2024-12-02 MED ORDER — DICLOFENAC SODIUM 1 % EX GEL
4.0000 g | Freq: Four times a day (QID) | CUTANEOUS | Status: DC
  Administered 2024-12-02 – 2024-12-05 (×11): 4 g via TOPICAL
  Filled 2024-12-02: qty 100

## 2024-12-02 NOTE — Progress Notes (Signed)
 PROGRESS NOTE    Susan Warren  FMW:[id number] DOB: 1976/07/07 DOA: 11/28/2024 PCP: Tanda Bleacher, MD    Brief Narrative:  47 years old Female with PMH significant for morbid obesity, lymphedema, DVT/PE on Xarelto , HTN, GERD, migraine headache, hepatic abscess s/p drain placement currently on Augmentin  presenting with syncopal episodes and palpitations. Patient has had intermittent near syncope over the last 2 months.  She lost consciousness while on commode on the day of presentation.  Denies fall. In ED, HR ranges from 112-138.  WBC 11.8 with left shift.  CMP without significant finding.  Lactic acid 2.7.  UA with pyuria, microscopic hematuria and bacteria.  Pro-Cal negative.  Serial troponin and BNP negative.  EKG showed sinus tachycardia to 110.  CT angio chest without large central PE but suggested cirrhosis and possible PAH.  CT A/P with grossly stable multiloculated partially calcified chronic 13 x 7 cm right inferior hepatic lobe subhepatic gas and fluid collection with an indwelling surgical drain, and hepatosplenomegaly.  Urine culture sent.  Cardiogram ordered.  Started on IV Zosyn  and admitted  Assessment & Plan:   Possible syncope:  Patient reports 2 months of intermittent palpitations and near syncope.   CTA chest negative for central PE but suggested possible liver cirrhosis and PAH.  CT head did not show any acute findings. EKG with mild sinus tachycardia at 110.  Serial troponin and BNP normal.   Echo shows LVEF 70 to 75% hyperdynamic function.  No RWMA. Orthostatic vital signs suggest orthostatic hypotension.  Patient has significant lower extremity lymphedema and so compression stockings cannot be used.  Patient not on any blood pressure lowering agents.  Could give her a trial of midodrine.  Will need to monitor for supine hypertension. PT and OT to continue to evaluate. TSH normal.  Will check cortisol level.   Hx of DVT/PE:  Reports compliance with Xarelto .  CT  angio chest with no central PE. Continue home Xarelto .   Pyuria / bacteriuria /microscopic hematuria:  Patient did grow a significant amount of Proteus on urine culture however she denied UTI symptoms, fever or abdominal pain.   Patient remains on Augmentin  for her history of hepatic abscess.   Hepatic abscess s/p drain insertion:  Patient reports minimal drainage from JP drain.  CT abdomen and pelvis as above.  -Resume home Augmentin . -Discussed with ID, and IR  Continue with the drain.   She has a drain clinic Appt with follow up scan on 12/10/24.   Possible liver cirrhosis / hepatosplenomegaly: -Needs outpatient follow-up with GI on discharge.   Lactic acidosis: Likely dehydration.   She has leukocytosis but no fever.  Pro-Cal negative.   Continue Augmentin  for liver cirrhosis.   Lactic acid resolved with IV hydration.   Lymphedema of b/l lower extremities -per patient at baseline no concerns.   Essential hypertension:  Normotensive for most part.  Not on meds at home.  -Continue monitoring.   NIDDM-2 with hyperglycemia:  HbA1c 6.8%.  Not on meds at home. - Continue SSI-sensitive.   Hypomagnesemia: Replaced.  Continue to monitor   Morbid obesity: Body mass index is 75.53 kg/m. Diet and exercise discussed in detail.  DVT prophylaxis: Xarelto  Code Status: Full code Family Communication: No family at bed side. Disposition Plan: Anticipate discharge home when improved.   Consultants:  IR ID  Procedures:None  Antimicrobials:  Anti-infectives (From admission, onward)    Start     Dose/Rate Route Frequency Ordered Stop   11/29/24 2000  amoxicillin -clavulanate (AUGMENTIN ) 875-125 MG  per tablet 1 tablet        1 tablet Oral 2 times daily 11/29/24 1046     11/29/24 1400  piperacillin -tazobactam (ZOSYN ) IVPB 3.375 g  Status:  Discontinued        3.375 g 12.5 mL/hr over 240 Minutes Intravenous Every 8 hours 11/29/24 0718 11/29/24 1046   11/29/24 0730   piperacillin -tazobactam (ZOSYN ) IVPB 3.375 g        3.375 g 100 mL/hr over 30 Minutes Intravenous  Once 11/29/24 0718 11/29/24 0902   11/28/24 2300  cefTRIAXone  (ROCEPHIN ) 1 g in sodium chloride  0.9 % 100 mL IVPB  Status:  Discontinued        1 g 200 mL/hr over 30 Minutes Intravenous Every 24 hours 11/28/24 2227 11/29/24 0647      Subjective: Patient continues to have dizzy spells especially when she gets out of her bed and starts walking.  Denies any chest pain shortness of breath.  No nausea vomiting.    Objective: Vitals:   12/01/24 0443 12/01/24 1240 12/01/24 2036 12/02/24 0514  BP: (!) 144/61 (!) 141/66 (!) 148/79 (!) 152/85  Pulse: 93 86 90 92  Resp: 20 20 18 16   Temp: 98.4 F (36.9 C) 98.4 F (36.9 C) 98.8 F (37.1 C) 97.8 F (36.6 C)  TempSrc: Oral Oral    SpO2: 98% 97% 99% 97%  Weight:      Height:        Intake/Output Summary (Last 24 hours) at 12/02/2024 1150 Last data filed at 12/02/2024 0000 Gross per 24 hour  Intake --  Output 20 ml  Net -20 ml   Filed Weights   11/29/24 0352  Weight: (!) 199.6 kg    Examination:  General appearance: Awake alert.  In no distress Resp: Clear to auscultation bilaterally.  Normal effort Cardio: S1-S2 is normal regular.  No S3-S4.  No rubs murmurs or bruit GI: Abdomen is soft.  Nontender nondistended.  Bowel sounds are present normal.  No masses organomegaly Extremities: See skin lower extremity lymphedema Neurologic: Alert and oriented x3.  No focal neurological deficits.     Data Reviewed: I have personally reviewed following labs and imaging studies  CBC: Recent Labs  Lab 11/28/24 1817 11/29/24 0109 11/30/24 0407 12/01/24 0419  WBC 11.8* 13.1* 8.0 8.1  NEUTROABS 9.5*  --   --   --   HGB 10.1* 10.2* 9.2* 9.2*  HCT 32.4* 34.0* 30.4* 31.2*  MCV 82.2 85.0 84.0 86.7  PLT PLATELET CLUMPS NOTED ON SMEAR, COUNT APPEARS ADEQUATE 415* 373 373   Basic Metabolic Panel: Recent Labs  Lab 11/28/24 1817  11/28/24 2234 11/29/24 0109 11/30/24 0407 12/01/24 0419  NA 138  --  137 135 136  K 3.6  --  3.6 4.0 4.0  CL 110  --  105 102 103  CO2 20*  --  19* 21* 21*  GLUCOSE 130*  --  176* 151* 185*  BUN 16  --  17 21* 18  CREATININE 0.81  --  0.93 0.74 0.84  CALCIUM  7.2*  --  8.0* 9.4 8.9  MG  --  1.5*  --  1.9 2.0  PHOS  --   --   --  4.6 4.2   GFR: Estimated Creatinine Clearance: 142.6 mL/min (by C-G formula based on SCr of 0.84 mg/dL). Liver Function Tests: Recent Labs  Lab 11/28/24 1817 11/29/24 0109 11/30/24 0407  AST 19 21  --   ALT 11 12  --   ALKPHOS 63  71  --   BILITOT 0.9 0.7  --   PROT 6.6 7.0  --   ALBUMIN 2.4* 2.7* 3.3*    Coagulation Profile: Recent Labs  Lab 11/29/24 0109  INR 1.8*   CBG: Recent Labs  Lab 12/01/24 1119 12/01/24 1612 12/01/24 2159 12/02/24 0801 12/02/24 1126  GLUCAP 194* 159* 137* 177* 195*    Sepsis Labs: Recent Labs  Lab 11/28/24 1833 11/28/24 2156 11/28/24 2234 11/29/24 1003 11/30/24 0407  PROCALCITON  --   --  <0.10  --   --   LATICACIDVEN 2.7* 3.0*  --  3.2* 1.0    Recent Results (from the past 240 hours)  Urine Culture (for pregnant, neutropenic or urologic patients or patients with an indwelling urinary catheter)     Status: Abnormal   Collection Time: 11/28/24 10:56 PM   Specimen: Urine, Clean Catch  Result Value Ref Range Status   Specimen Description URINE, CLEAN CATCH  Final   Special Requests   Final    ADDED 2256 Performed at [facility], [address], [place], [place] [address]    Culture >=100,000 COLONIES/mL PROTEUS PENNERI (A)  Final   Report Status 12/01/2024 FINAL  Final   Organism ID, Bacteria PROTEUS PENNERI (A)  Final      Susceptibility   Proteus penneri - MIC*    AMPICILLIN  >=32 RESISTANT Resistant     CEFEPIME <=0.12 SENSITIVE Sensitive     ERTAPENEM <=0.12 SENSITIVE Sensitive     CEFTRIAXONE  >=64 RESISTANT Resistant     CIPROFLOXACIN <=0.06 SENSITIVE Sensitive     GENTAMICIN <=1  SENSITIVE Sensitive     NITROFURANTOIN 128 RESISTANT Resistant     TRIMETH/SULFA <=20 SENSITIVE Sensitive     AMPICILLIN /SULBACTAM >=32 RESISTANT Resistant     PIP/TAZO Value in next row Sensitive      <=4 SENSITIVEThis is a modified FDA-approved test that has been validated and its performance characteristics determined by the reporting laboratory.  This laboratory is certified under the Clinical Laboratory Improvement Amendments CLIA as qualified to perform high complexity clinical laboratory testing.    MEROPENEM Value in next row Sensitive      <=4 SENSITIVEThis is a modified FDA-approved test that has been validated and its performance characteristics determined by the reporting laboratory.  This laboratory is certified under the Clinical Laboratory Improvement Amendments CLIA as qualified to perform high complexity clinical laboratory testing.    * >=100,000 COLONIES/mL PROTEUS PENNERI    Radiology Studies: CT HEAD WO CONTRAST ( ) Result Date: 12/01/2024 EXAM: CT HEAD WITHOUT 12/01/2024 11:25:00 AM TECHNIQUE: CT of the head was performed without the administration of intravenous contrast. Automated exposure control, iterative reconstruction, and/or weight based adjustment of the mA/kV was utilized to reduce the radiation dose to as low as reasonably achievable. COMPARISON: None available. CLINICAL HISTORY: Syncope/presyncope, cerebrovascular cause suspected. FINDINGS: BRAIN AND VENTRICLES: No acute intracranial hemorrhage. No mass effect or midline shift. No extra-axial fluid collection. No evidence of acute infarct. No hydrocephalus. ORBITS: No acute abnormality. SINUSES AND MASTOIDS: No acute abnormality. SOFT TISSUES AND SKULL: No acute skull fracture. No acute soft tissue abnormality. IMPRESSION: 1. No acute intracranial abnormality. Electronically signed by: Donnice Mania MD 12/01/2024 12:08 PM EST RP Workstation: [id number]   Scheduled Meds:  amoxicillin -clavulanate  1 tablet Oral BID    insulin  aspart  0-9 Units Subcutaneous TID WC   rivaroxaban   20 mg Oral Q supper   Continuous Infusions:   LOS: 4 days    Itali Mckendry,  MD [facility]   If 7PM-7AM, please contact night-coverage

## 2024-12-02 NOTE — Progress Notes (Signed)
 Physical Therapy Treatment Patient Details Name: Susan Warren MRN: [id number] DOB: 06-05-1976 Today's Date: 12/02/2024   History of Present Illness Pt is 48 yo female admitted 11/28/24 after syncopal episode. Pt with hx including but not limited to DVT, GERD, HTN, lymphedema bil LE, migraines, morbid obexity with BMI>70, PE, hospitalization in Aug 25 for hepatic cyst with drain placement    PT Comments  Noted positive orthostatics this morning with mobility specialist. Pt performed seated BUE/LE strengthening exercises, with rest between each exercise 2* fatigue, HR 110 with seated exercise. Pt ambulated short distance (18') in room, distance limited by dizziness, HR 126 walking. Will reassess orthostatics next session.    If plan is discharge home, recommend the following: Assist for transportation;Help with stairs or ramp for entrance   Can travel by private vehicle        Equipment Recommendations  None recommended by PT    Recommendations for Other Services       Precautions / Restrictions Precautions Precautions: Fall Precaution/Restrictions Comments: watch BP, + orthostatics with MS 12/11 Restrictions Weight Bearing Restrictions Per Provider Order: No     Mobility  Bed Mobility Overal bed mobility: Modified Independent       Supine to sit: Modified independent (Device/Increase time) Sit to supine: Modified independent (Device/Increase time)   General bed mobility comments: self able with HOB elevated and use of rails.  Also uses a strap to lift R LE up back onto bed.    Transfers Overall transfer level: Modified independent Equipment used: None Transfers: Sit to/from Stand Sit to Stand: Supervision, Contact guard assist           General transfer comment: Pt self able to rise and lower no AD from elevated surface with forward weight shift due to body habitus.  Good safety awareness.    Ambulation/Gait Ambulation/Gait assistance: Supervision Gait  Distance (Feet): 18 Feet Assistive device: None Gait Pattern/deviations: Step-through pattern, Decreased stride length, Wide base of support Gait velocity: decreased     General Gait Details: short walk in room only 2* + orthostatics with mobility specialist earlier today, pt reported she just started midodrine, reports some dizziness but it was less intense while walking, HR 126 walking, no loss of balance   Stairs             Wheelchair Mobility     Tilt Bed    Modified Rankin (Stroke Patients Only)       Balance Overall balance assessment: Needs assistance Sitting-balance support: No upper extremity supported Sitting balance-Leahy Scale: Good     Standing balance support: No upper extremity supported Standing balance-Leahy Scale: Fair                              Hotel Manager: No apparent difficulties  Cognition Arousal: Alert Behavior During Therapy: WFL for tasks assessed/performed   PT - Cognitive impairments: No apparent impairments                       PT - Cognition Comments: AxO x 3 pleasant and willing.  Lives home alone and works from home. Following commands: Intact      Cueing Cueing Techniques: Verbal cues  Exercises General Exercises - Upper Extremity Shoulder Flexion: AROM, Both, 10 reps, Seated General Exercises - Lower Extremity Ankle Circles/Pumps: AROM, Both, 10 reps, Seated Long Arc Quad: AROM, Both, 10 reps, Seated Hip Flexion/Marching: AROM, Both, 10 reps,  Seated    General Comments        Pertinent Vitals/Pain Pain Assessment Pain Assessment: No/denies pain    Home Living                          Prior Function            PT Goals (current goals can now be found in the care plan section) Acute Rehab PT Goals Patient Stated Goal: 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 PT Goal Formulation: With patient Time For Goal Achievement:  12/13/24 Potential to Achieve Goals: Good Progress towards PT goals: Progressing toward goals    Frequency    Min 3X/week      PT Plan      Co-evaluation              AM-PAC PT 6 Clicks Mobility   Outcome Measure  Help needed turning from your back to your side while in a flat bed without using bedrails?: None Help needed moving from lying on your back to sitting on the side of a flat bed without using bedrails?: None Help needed moving to and from a bed to a chair (including a wheelchair)?: None Help needed standing up from a chair using your arms (e.g., wheelchair or bedside chair)?: None Help needed to walk in hospital room?: None Help needed climbing 3-5 steps with a railing? : A Little 6 Click Score: 23    End of Session Equipment Utilized During Treatment: Gait belt Activity Tolerance: Patient limited by fatigue Patient left: in bed;with call bell/phone within reach Nurse Communication: Mobility status PT Visit Diagnosis: Other abnormalities of gait and mobility (R26.89);Dizziness and giddiness (R42)     Time: 8574-8560 PT Time Calculation (min) (ACUTE ONLY): 14 min  Charges:    $Therapeutic Exercise: 8-22 mins PT General Charges $$ ACUTE PT VISIT: 1 Visit                    Sylvan Delon Copp PT 12/02/2024  Acute Rehabilitation Services  Office [phone]

## 2024-12-02 NOTE — Progress Notes (Signed)
 Mobility Specialist - Progress Note  Post-mobility: 92 bpm HR, 153/85 mmHg (103 MAP) BP,   12/02/24 1043  Orthostatic Lying   BP- Lying 155/72  Pulse- Lying 89  Orthostatic Sitting  BP- Sitting 137/77  Pulse- Sitting 104  Orthostatic Standing at 0 minutes  BP- Standing at 0 minutes 109/72  Pulse- Standing at 0 minutes 115  Mobility  Activity Ambulated with assistance  Level of Assistance Standby assist, set-up cues, supervision of patient - no hands on  Assistive Device None  Distance Ambulated (ft) 24 ft  Range of Motion/Exercises Active  Activity Response Tolerated fair  Mobility visit 1 Mobility  Mobility Specialist Start Time (ACUTE ONLY) 1028  Mobility Specialist Stop Time (ACUTE ONLY) 1043  Mobility Specialist Time Calculation (min) (ACUTE ONLY) 15 min   Pt was found in bed and agreeable to mobilize. Orthostatics vitals taken and recorded above and flowsheets. Pt stated dizziness with standing and worsening with mobilizing. At EOS returned to bed with all needs met. Call bell in reach.   Erminio Leos,  Mobility Specialist Can be reached via Secure Chat

## 2024-12-02 NOTE — Plan of Care (Signed)
  Problem: Clinical Measurements: Goal: Cardiovascular complication will be avoided Outcome: Progressing   Problem: Clinical Measurements: Goal: Respiratory complications will improve Outcome: Progressing   Problem: Activity: Goal: Risk for activity intolerance will decrease Outcome: Progressing   Problem: Nutrition: Goal: Adequate nutrition will be maintained Outcome: Progressing

## 2024-12-03 ENCOUNTER — Other Ambulatory Visit (HOSPITAL_COMMUNITY): Payer: Self-pay

## 2024-12-03 ENCOUNTER — Ambulatory Visit: Admitting: Internal Medicine

## 2024-12-03 LAB — GLUCOSE, CAPILLARY
Glucose-Capillary: 179 mg/dL — ABNORMAL HIGH (ref 70–99)
Glucose-Capillary: 221 mg/dL — ABNORMAL HIGH (ref 70–99)
Glucose-Capillary: 187 mg/dL — ABNORMAL HIGH (ref 70–99)
Glucose-Capillary: 242 mg/dL — ABNORMAL HIGH (ref 70–99)

## 2024-12-03 LAB — COMPREHENSIVE METABOLIC PANEL WITH GFR
ALT: 6 U/L (ref 0–44)
AST: 22 U/L (ref 15–41)
Albumin: 3.3 g/dL — ABNORMAL LOW (ref 3.5–5.0)
Alkaline Phosphatase: 74 U/L (ref 38–126)
Anion gap: 12 (ref 5–15)
BUN: 19 mg/dL (ref 6–20)
CO2: 22 mmol/L (ref 22–32)
Calcium: 9.1 mg/dL (ref 8.9–10.3)
Chloride: 103 mmol/L (ref 98–111)
Creatinine, Ser: 0.89 mg/dL (ref 0.44–1.00)
GFR, Estimated: >60 mL/min (ref >60)
Glucose, Bld: 183 mg/dL — ABNORMAL HIGH (ref 70–99)
Potassium: 4.5 mmol/L (ref 3.5–5.1)
Sodium: 136 mmol/L (ref 135–145)
Total Bilirubin: 0.4 mg/dL (ref 0.0–1.2)
Total Protein: 7.4 g/dL (ref 6.5–8.1)

## 2024-12-03 LAB — CBC
HCT: 30.3 % — ABNORMAL LOW (ref 36.0–46.0)
Hemoglobin: 9.2 g/dL — ABNORMAL LOW (ref 12.0–15.0)
MCH: 26 pg (ref 26.0–34.0)
MCHC: 30.4 g/dL (ref 30.0–36.0)
MCV: 85.6 fL (ref 80.0–100.0)
Platelets: 415 K/uL — ABNORMAL HIGH (ref 150–400)
RBC: 3.54 MIL/uL — ABNORMAL LOW (ref 3.87–5.11)
RDW: 15.6 % — ABNORMAL HIGH (ref 11.5–15.5)
WBC: 7.6 K/uL (ref 4.0–10.5)
nRBC: 0 % (ref 0.0–0.2)

## 2024-12-03 LAB — MAGNESIUM: Magnesium: 1.9 mg/dL (ref 1.7–2.4)

## 2024-12-03 LAB — CORTISOL: Cortisol, Plasma: 5.7 ug/dL

## 2024-12-03 MED ORDER — OXYCODONE HCL 5 MG PO TABS
5.0000 mg | ORAL_TABLET | Freq: Two times a day (BID) | ORAL | Status: DC | PRN
  Administered 2024-12-03 – 2024-12-04 (×2): 5 mg via ORAL
  Filled 2024-12-03 (×2): qty 1

## 2024-12-03 MED ORDER — COSYNTROPIN 0.25 MG IJ SOLR
0.2500 mg | Freq: Once | INTRAMUSCULAR | Status: AC
  Administered 2024-12-04: 0.25 mg via INTRAVENOUS
  Filled 2024-12-03 (×2): qty 0.25

## 2024-12-03 NOTE — Progress Notes (Signed)
 PROGRESS NOTE    Susan Warren  FMW:[id number] DOB: 05-06-1976 DOA: 11/28/2024 PCP: Tanda Bleacher, MD    Brief Narrative:  48 years old Female with PMH significant for morbid obesity, lymphedema, DVT/PE on Xarelto , HTN, GERD, migraine headache, hepatic abscess s/p drain placement currently on Augmentin  presenting with syncopal episodes and palpitations. Patient has had intermittent near syncope over the last 2 months.  She lost consciousness while on commode on the day of presentation.  Denies fall. In ED, HR ranges from 112-138.  WBC 11.8 with left shift.  CMP without significant finding.  Lactic acid 2.7.  UA with pyuria, microscopic hematuria and bacteria.  Pro-Cal negative.  Serial troponin and BNP negative.  EKG showed sinus tachycardia to 110.  CT angio chest without large central PE but suggested cirrhosis and possible PAH.  CT A/P with grossly stable multiloculated partially calcified chronic 13 x 7 cm right inferior hepatic lobe subhepatic gas and fluid collection with an indwelling surgical drain, and hepatosplenomegaly.  Urine culture sent.  ECHO was ordered.  Started on IV Zosyn  and admitted  Assessment & Plan:   Syncope/orthostatic hypotension Patient reports 2 months of intermittent palpitations and near syncope.   CTA chest negative for central PE but suggested possible liver cirrhosis and PAH.  CT head did not show any acute findings. EKG with mild sinus tachycardia at 110.  Serial troponin and BNP normal.   Echo shows LVEF 70 to 75% hyperdynamic function.  No RWMA. Orthostatic vital signs suggest orthostatic hypotension.  Patient has significant lower extremity lymphedema and so compression stockings cannot be used.  Patient not on any blood pressure lowering agents.  Patient was started on midodrine.  She does feel better today.  Will wait for PT to ambulate her today to see how she does.   Cortisol level noted to be low at 5.7.  Will order ACTH stimulation test. TSH  normal.    Hx of DVT/PE:  Reports compliance with Xarelto .  CT angio chest with no central PE. Continue home Xarelto .   Pyuria / bacteriuria /microscopic hematuria:  Patient did grow a significant amount of Proteus on urine culture however she denied UTI symptoms, fever or abdominal pain.   Patient remains on Augmentin  for her history of hepatic abscess.   Hepatic abscess s/p drain insertion:  Patient reports minimal drainage from JP drain.  CT abdomen and pelvis as above.  -Resume home Augmentin . -Discussed with ID, and IR  Continue with the drain.   She has a drain clinic Appt with follow up scan on 12/10/24.   Possible liver cirrhosis / hepatosplenomegaly: -Needs outpatient follow-up with GI on discharge.   Lactic acidosis: Likely dehydration.   She has leukocytosis but no fever.  Pro-Cal negative.   Continue Augmentin  for liver cirrhosis.   Lactic acid resolved with IV hydration.   Lymphedema of b/l lower extremities -per patient at baseline no concerns.  Normocytic anemia Likely anemia of chronic disease.  Hemoglobin is stable.  No evidence for overt bleeding.   NIDDM-2 with hyperglycemia:  HbA1c 6.8%.  Not on meds at home. - Continue SSI-sensitive.   Hypomagnesemia: Replaced.  Continue to monitor   Morbid obesity: Body mass index is 75.53 kg/m. Diet and exercise discussed in detail.  DVT prophylaxis: Xarelto  Code Status: Full code Family Communication: No family at bed side. Disposition Plan: Dissipate discharge tomorrow if she is able to mobilize.   Consultants:  IR ID  Procedures:None  Antimicrobials:  Anti-infectives (From admission, onward)  Start     Dose/Rate Route Frequency Ordered Stop   11/29/24 2000  amoxicillin -clavulanate (AUGMENTIN ) 875-125 MG per tablet 1 tablet        1 tablet Oral 2 times daily 11/29/24 1046     11/29/24 1400  piperacillin -tazobactam (ZOSYN ) IVPB 3.375 g  Status:  Discontinued        3.375 g 12.5 mL/hr over 240  Minutes Intravenous Every 8 hours 11/29/24 0718 11/29/24 1046   11/29/24 0730  piperacillin -tazobactam (ZOSYN ) IVPB 3.375 g        3.375 g 100 mL/hr over 30 Minutes Intravenous  Once 11/29/24 0718 11/29/24 0902   11/28/24 2300  cefTRIAXone  (ROCEPHIN ) 1 g in sodium chloride  0.9 % 100 mL IVPB  Status:  Discontinued        1 g 200 mL/hr over 30 Minutes Intravenous Every 24 hours 11/28/24 2227 11/29/24 0647      Subjective: Feels better.  Complains of chronic leg pain.  This is ongoing since 2021 when she was originally diagnosed with DVT.  Dizziness has improved.  She is looking forward to ambulating today to see how she does.     Objective: Vitals:   12/02/24 0514 12/02/24 1308 12/02/24 2020 12/03/24 0604  BP: (!) 152/85 117/65 119/70 (!) 155/77  Pulse: 92 85 92 86  Resp: 16 20 18 19   Temp: 97.8 F (36.6 C) 98.2 F (36.8 C) 98.7 F (37.1 C) (!) 97.5 F (36.4 C)  TempSrc:  Oral Oral Oral  SpO2: 97% 100% 98% 97%  Weight:      Height:        Intake/Output Summary (Last 24 hours) at 12/03/2024 1050 Last data filed at 12/02/2024 1900 Gross per 24 hour  Intake 10 ml  Output 20 ml  Net -10 ml   Filed Weights   11/29/24 0352  Weight: (!) 199.6 kg    Examination:  General appearance: Awake alert.  In no distress Resp: Clear to auscultation bilaterally.  Normal effort Cardio: S1-S2 is normal regular.  No S3-S4.  No rubs murmurs or bruit GI: Abdomen is soft.  Nontender nondistended.  Bowel sounds are present normal.  No masses organomegaly Extremities: Significant lymphedema noted. No obvious focal neurological deficits  Data Reviewed: I have personally reviewed following labs and imaging studies  CBC: Recent Labs  Lab 11/28/24 1817 11/29/24 0109 11/30/24 0407 12/01/24 0419 12/03/24 0356  WBC 11.8* 13.1* 8.0 8.1 7.6  NEUTROABS 9.5*  --   --   --   --   HGB 10.1* 10.2* 9.2* 9.2* 9.2*  HCT 32.4* 34.0* 30.4* 31.2* 30.3*  MCV 82.2 85.0 84.0 86.7 85.6  PLT PLATELET  CLUMPS NOTED ON SMEAR, COUNT APPEARS ADEQUATE 415* 373 373 415*   Basic Metabolic Panel: Recent Labs  Lab 11/28/24 1817 11/28/24 2234 11/29/24 0109 11/30/24 0407 12/01/24 0419 12/03/24 0356  NA 138  --  137 135 136 136  K 3.6  --  3.6 4.0 4.0 4.5  CL 110  --  105 102 103 103  CO2 20*  --  19* 21* 21* 22  GLUCOSE 130*  --  176* 151* 185* 183*  BUN 16  --  17 21* 18 19  CREATININE 0.81  --  0.93 0.74 0.84 0.89  CALCIUM  7.2*  --  8.0* 9.4 8.9 9.1  MG  --  1.5*  --  1.9 2.0 1.9  PHOS  --   --   --  4.6 4.2  --    GFR: Estimated  Creatinine Clearance: 134.5 mL/min (by C-G formula based on SCr of 0.89 mg/dL). Liver Function Tests: Recent Labs  Lab 11/28/24 1817 11/29/24 0109 11/30/24 0407 12/03/24 0356  AST 19 21  --  22  ALT 11 12  --  6  ALKPHOS 63 71  --  74  BILITOT 0.9 0.7  --  0.4  PROT 6.6 7.0  --  7.4  ALBUMIN 2.4* 2.7* 3.3* 3.3*    Coagulation Profile: Recent Labs  Lab 11/29/24 0109  INR 1.8*   CBG: Recent Labs  Lab 12/02/24 0801 12/02/24 1126 12/02/24 1607 12/02/24 2018 12/03/24 0740  GLUCAP 177* 195* 191* 188* 179*    Sepsis Labs: Recent Labs  Lab 11/28/24 1833 11/28/24 2156 11/28/24 2234 11/29/24 1003 11/30/24 0407  PROCALCITON  --   --  <0.10  --   --   LATICACIDVEN 2.7* 3.0*  --  3.2* 1.0    Recent Results (from the past 240 hours)  Urine Culture (for pregnant, neutropenic or urologic patients or patients with an indwelling urinary catheter)     Status: Abnormal   Collection Time: 11/28/24 10:56 PM   Specimen: Urine, Clean Catch  Result Value Ref Range Status   Specimen Description URINE, CLEAN CATCH  Final   Special Requests   Final    ADDED 2256 Performed at [facility], [address] [place], [place] [address]    Culture >=100,000 COLONIES/mL PROTEUS PENNERI (A)  Final   Report Status 12/01/2024 FINAL  Final   Organism ID, Bacteria PROTEUS PENNERI (A)  Final      Susceptibility   Proteus penneri - MIC*    AMPICILLIN   >=32 RESISTANT Resistant     CEFEPIME <=0.12 SENSITIVE Sensitive     ERTAPENEM <=0.12 SENSITIVE Sensitive     CEFTRIAXONE  >=64 RESISTANT Resistant     CIPROFLOXACIN <=0.06 SENSITIVE Sensitive     GENTAMICIN <=1 SENSITIVE Sensitive     NITROFURANTOIN 128 RESISTANT Resistant     TRIMETH/SULFA <=20 SENSITIVE Sensitive     AMPICILLIN /SULBACTAM >=32 RESISTANT Resistant     PIP/TAZO Value in next row Sensitive      <=4 SENSITIVEThis is a modified FDA-approved test that has been validated and its performance characteristics determined by the reporting laboratory.  This laboratory is certified under the Clinical Laboratory Improvement Amendments CLIA as qualified to perform high complexity clinical laboratory testing.    MEROPENEM Value in next row Sensitive      <=4 SENSITIVEThis is a modified FDA-approved test that has been validated and its performance characteristics determined by the reporting laboratory.  This laboratory is certified under the Clinical Laboratory Improvement Amendments CLIA as qualified to perform high complexity clinical laboratory testing.    * >=100,000 COLONIES/mL PROTEUS PENNERI    Radiology Studies: CT HEAD WO CONTRAST ( ) Result Date: 12/01/2024 EXAM: CT HEAD WITHOUT 12/01/2024 11:25:00 AM TECHNIQUE: CT of the head was performed without the administration of intravenous contrast. Automated exposure control, iterative reconstruction, and/or weight based adjustment of the mA/kV was utilized to reduce the radiation dose to as low as reasonably achievable. COMPARISON: None available. CLINICAL HISTORY: Syncope/presyncope, cerebrovascular cause suspected. FINDINGS: BRAIN AND VENTRICLES: No acute intracranial hemorrhage. No mass effect or midline shift. No extra-axial fluid collection. No evidence of acute infarct. No hydrocephalus. ORBITS: No acute abnormality. SINUSES AND MASTOIDS: No acute abnormality. SOFT TISSUES AND SKULL: No acute skull fracture. No acute soft tissue  abnormality. IMPRESSION: 1. No acute intracranial abnormality. Electronically signed by: Donnice Mania MD  12/01/2024 12:08 PM EST RP Workstation: [id number]   Scheduled Meds:  amoxicillin -clavulanate  1 tablet Oral BID   diclofenac  Sodium  4 g Topical QID   insulin  aspart  0-9 Units Subcutaneous TID WC   midodrine  5 mg Oral TID WC   rivaroxaban   20 mg Oral Q supper   saccharomyces boulardii  250 mg Oral BID   Continuous Infusions:   LOS: 5 days    Joette Pebbles, MD [facility]   If 7PM-7AM, please contact night-coverage

## 2024-12-03 NOTE — Inpatient Diabetes Management (Signed)
 Inpatient Diabetes Program Recommendations  AACE/ADA: New Consensus Statement on Inpatient Glycemic Control (2015)  Target Ranges:  Prepandial:   less than 140 mg/dL      Peak postprandial:   less than 180 mg/dL (1-2 hours)      Critically ill patients:  140 - 180 mg/dL   Lab Results  Component Value Date   GLUCAP 221 (H) 12/03/2024   HGBA1C 6.8 (H) 11/28/2024    Review of Glycemic Control  Latest Reference Range & Units 12/02/24 08:01 12/02/24 11:26 12/02/24 16:07 12/02/24 20:18 12/03/24 07:40 12/03/24 11:08  Glucose-Capillary 70 - 99 mg/dL 822 (H) 804 (H) 808 (H) 188 (H) 179 (H) 221 (H)   Diabetes history: None, Pt meets criteria for new diabetes diagnosis. Pt denies previous history.  Current orders for Inpatient glycemic control:  Novolog  0-9 units tid A1c 6.8% on 11/28/2024  Spoke with pt at bedside regarding A1c level and that its a measurement of glucose trends over the previous 3 months. Discussed risk factors and some lifestyle modifications. Encouraged follow up in 3 months to get a recheck by her PCP.  Thanks,  Clotilda Bull RN, MSN, BC-ADM Inpatient Diabetes Coordinator Team Pager [phone] (8a-5p)

## 2024-12-03 NOTE — Plan of Care (Signed)
°  Problem: Health Behavior/Discharge Planning: Goal: Ability to manage health-related needs will improve Outcome: Progressing   Problem: Skin Integrity: Goal: Risk for impaired skin integrity will decrease Outcome: Progressing   Problem: Nutritional: Goal: Maintenance of adequate nutrition will improve Outcome: Progressing   Problem: Metabolic: Goal: Ability to maintain appropriate glucose levels will improve Outcome: Progressing

## 2024-12-03 NOTE — Progress Notes (Addendum)
 Occupational Therapy Treatment Patient Details Name: Susan Warren MRN: [id number] DOB: May 04, 1976 Today's Date: 12/03/2024   History of present illness Pt is 48 yo female admitted 11/28/24 after syncopal episode. Pt with hx including but not limited to DVT, GERD, HTN, lymphedema bil LE, migraines, morbid obexity with BMI>70, PE, hospitalization in Aug 25 for hepatic cyst with drain placement   OT comments  Patient seen for skilled OT session this afternoon. Educated on 5 P's of energy conservation, pacing, positioning for B/IADL's and issued and trained in long handled AE for LB bathing. Recommending shower seat and patient provided info for obtaining. Anticipate no follow up OT needed at discharge. Patient requires continued Acute care hospital level OT services to progress safety and functional performance and allow for discharge.        If plan is discharge home, recommend the following:  A little help with walking and/or transfers;A little help with bathing/dressing/bathroom;Assistance with cooking/housework;Assist for transportation;Help with stairs or ramp for entrance   Equipment Recommendations  Tub/shower seat;Other (comment) (OT to provide handout for obtaining shower seat)    Recommendations for Other Services      Precautions / Restrictions Precautions Precautions: Fall Precaution/Restrictions Comments: watch BP, + orthostatics with MS 12/11 Restrictions Weight Bearing Restrictions Per Provider Order: No       Mobility Bed Mobility Overal bed mobility: Modified Independent Bed Mobility: Supine to Sit, Sit to Supine     Supine to sit: Modified independent (Device/Increase time) Sit to supine: Modified independent (Device/Increase time)   General bed mobility comments: self able with HOB elevated and use of rails.  Also uses a strap to lift R LE up back onto bed.    Transfers Overall transfer level: Modified independent Equipment used: None Transfers: Sit  to/from Stand Sit to Stand: Supervision, Contact guard assist     Step pivot transfers: Supervision     General transfer comment: Pt self able to rise and lower no AD from elevated surface with forward weight shift due to body habitus.  Good safety awareness.     Balance Overall balance assessment: Needs assistance Sitting-balance support: No upper extremity supported Sitting balance-Leahy Scale: Good     Standing balance support: No upper extremity supported Standing balance-Leahy Scale: Fair                             ADL either performed or assessed with clinical judgement   ADL Overall ADL's : Modified independent                                       General ADL Comments: intiated education for AE and DME for LB and out of reach items to decrease falls/dizziness risk, initiated ECT principles, issued and trained in use of long handled AE for LB reach and shower DME with + teach back    Extremity/Trunk Assessment Upper Extremity Assessment Upper Extremity Assessment: Overall WFL for tasks assessed;Right hand dominant   Lower Extremity Assessment Lower Extremity Assessment: Defer to PT evaluation                 Communication Communication Communication: No apparent difficulties   Cognition Arousal: Alert Behavior During Therapy: [facility] for tasks assessed/performed Cognition: No apparent impairments  Following commands: Intact        Cueing   Cueing Techniques: Verbal cues  Exercises Exercises: Other exercises (pacing and breathing exercises during light ADL's)       General Comments BP remained stable at end f session 155/73 and HR 98-100    Pertinent Vitals/ Pain       Pain Assessment Pain Assessment: No/denies pain   Frequency  Min 2X/week        Progress Toward Goals  OT Goals(current goals can now be found in the care plan section)  Progress towards OT goals:  Progressing toward goals  Acute Rehab OT Goals Patient Stated Goal: to get home to my son and cats OT Goal Formulation: With patient Time For Goal Achievement: 12/14/24 Potential to Achieve Goals: Good ADL Goals Pt Will Perform Lower Body Bathing: with modified independence;with adaptive equipment;sit to/from stand Pt Will Perform Lower Body Dressing: with modified independence;with adaptive equipment;sit to/from stand Pt Will Transfer to Toilet: with modified independence;ambulating;regular height toilet Pt Will Perform Toileting - Clothing Manipulation and hygiene: with modified independence;sitting/lateral leans Pt Will Perform Tub/Shower Transfer: Tub transfer;with modified independence;shower seat Pt/caregiver will Perform Home Exercise Program: Both right and left upper extremity;With written HEP provided;Independently Additional ADL Goal #1: patient will teach back indep 5/5 ECT strategies including need for slow position changes, use of AD and shower bench  Plan         AM-PAC OT 6 Clicks Daily Activity     Outcome Measure   Help from another person eating meals?: None Help from another person taking care of personal grooming?: None Help from another person toileting, which includes using toliet, bedpan, or urinal?: None Help from another person bathing (including washing, rinsing, drying)?: None Help from another person to put on and taking off regular upper body clothing?: None Help from another person to put on and taking off regular lower body clothing?: None 6 Click Score: 24    End of Session Equipment Utilized During Treatment: Gait belt  OT Visit Diagnosis: Unsteadiness on feet (R26.81);Pain   Activity Tolerance Patient tolerated treatment well   Patient Left in bed;with call bell/phone within reach;with bed alarm set   Nurse Communication Mobility status;Other (comment) (encouraged recliner use as able)        Time: 8784-8754 OT Time Calculation (min):  30 min  Charges: OT General Charges $OT Visit: 1 Visit OT Treatments $Self Care/Home Management : 23-37 mins  Susan Warren OT/L Acute Rehabilitation Department  [phone]  12/03/2024, 6:49 AM

## 2024-12-04 LAB — GLUCOSE, CAPILLARY
Glucose-Capillary: 199 mg/dL — ABNORMAL HIGH (ref 70–99)
Glucose-Capillary: 227 mg/dL — ABNORMAL HIGH (ref 70–99)
Glucose-Capillary: 208 mg/dL — ABNORMAL HIGH (ref 70–99)
Glucose-Capillary: 250 mg/dL — ABNORMAL HIGH (ref 70–99)

## 2024-12-04 LAB — ACTH STIMULATION, 3 TIME POINTS
Cortisol, 30 Min: 19.8 ug/dL
Cortisol, 60 Min: 20.5 ug/dL
Cortisol, Base: 12 ug/dL

## 2024-12-04 MED ORDER — MIDODRINE HCL 5 MG PO TABS
5.0000 mg | ORAL_TABLET | Freq: Three times a day (TID) | ORAL | 1 refills | Status: DC
  Filled 2024-12-04 – 2024-12-05 (×2): qty 90, 30d supply, fill #0

## 2024-12-04 NOTE — Progress Notes (Signed)
 PROGRESS NOTE    Susan Warren  FMW:[id number] DOB: [birth year] DOA: 11/28/2024 PCP: Tanda Bleacher, MD    Brief Narrative:  48 years old Female with PMH significant for morbid obesity, lymphedema, DVT/PE on Xarelto , HTN, GERD, migraine headache, hepatic abscess s/p drain placement currently on Augmentin  presenting with syncopal episodes and palpitations. Patient has had intermittent near syncope over the last 2 months.  She lost consciousness while on commode on the day of presentation.  Denies fall. In ED, HR ranges from 112-138.  WBC 11.8 with left shift.  CMP without significant finding.  Lactic acid 2.7.  UA with pyuria, microscopic hematuria and bacteria.  Pro-Cal negative.  Serial troponin and BNP negative.  EKG showed sinus tachycardia to 110.  CT angio chest without large central PE but suggested cirrhosis and possible PAH.  CT A/P with grossly stable multiloculated partially calcified chronic 13 x 7 cm right inferior hepatic lobe subhepatic gas and fluid collection with an indwelling surgical drain, and hepatosplenomegaly.  Urine culture sent.  ECHO was ordered.  Started on IV Zosyn  and admitted  Assessment & Plan:   Syncope/orthostatic hypotension Patient reported 2 months of intermittent palpitations and near syncope.   CTA chest negative for central PE but suggested possible liver cirrhosis and PAH.  CT head did not show any acute findings. EKG with mild sinus tachycardia at 110.  Serial troponin and BNP normal.   Echo shows LVEF 70 to 75% hyperdynamic function.  No RWMA. Orthostatic vital signs suggest orthostatic hypotension.  Patient has significant lower extremity lymphedema and so compression stockings cannot be used.  Patient not on any blood pressure lowering agents.   Patient was started on midodrine .  Improvement noted.  She has been able to go to the bathroom without difficulty.  Await orthostatic vital signs from this morning and ambulation this morning.      Cortisol level noted to be low at 5.7.  ACTH stimulation test is pending.   TSH normal.    Hx of DVT/PE:  Reports compliance with Xarelto .  CT angio chest with no central PE. Continue home Xarelto .   Pyuria / bacteriuria /microscopic hematuria:  Patient did grow a significant amount of Proteus on urine culture however she denied UTI symptoms, fever or abdominal pain.   Patient remains on Augmentin  for her history of hepatic abscess.   Hepatic abscess s/p drain insertion:  Patient reports minimal drainage from JP drain.  CT abdomen and pelvis as above.  Continue Augmentin .   Discussed with ID, and IR  Continue with the drain.   She has a drain clinic Appt with follow up scan on 12/10/24.   Possible liver cirrhosis / hepatosplenomegaly: Needs outpatient follow-up with GI on discharge.   Lactic acidosis: Likely sec to dehydration.   She has leukocytosis but no fever.  Pro-Cal negative.   Continue Augmentin  for liver cirrhosis.   Lactic acid resolved with IV hydration.   Lymphedema of b/l lower extremities Per patient at baseline.  Normocytic anemia Likely anemia of chronic disease.  Hemoglobin is stable.  No evidence for overt bleeding.   NIDDM-2 with hyperglycemia:  HbA1c 6.8%.  Not on meds at home. - Continue SSI-sensitive.   Hypomagnesemia: Replaced.  Continue to monitor   Morbid obesity: Body mass index is 75.53 kg/m. Diet and exercise discussed in detail.  DVT prophylaxis: Xarelto  Code Status: Full code Family Communication: No family at bed side. Disposition Plan: Anticipate discharge later today if orthostatic vital signs are improved and if she is  able to ambulate and if her ACTH stimulation test is unremarkable.   Consultants:  IR ID  Procedures:None  Antimicrobials:  Anti-infectives (From admission, onward)    Start     Dose/Rate Route Frequency Ordered Stop   11/29/24 2000  amoxicillin -clavulanate (AUGMENTIN ) 875-125 MG per tablet 1 tablet         1 tablet Oral 2 times daily 11/29/24 1046     11/29/24 1400  piperacillin -tazobactam (ZOSYN ) IVPB 3.375 g  Status:  Discontinued        3.375 g 12.5 mL/hr over 240 Minutes Intravenous Every 8 hours 11/29/24 0718 11/29/24 1046   11/29/24 0730  piperacillin -tazobactam (ZOSYN ) IVPB 3.375 g        3.375 g 100 mL/hr over 30 Minutes Intravenous  Once 11/29/24 0718 11/29/24 0902   11/28/24 2300  cefTRIAXone  (ROCEPHIN ) 1 g in sodium chloride  0.9 % 100 mL IVPB  Status:  Discontinued        1 g 200 mL/hr over 30 Minutes Intravenous Every 24 hours 11/28/24 2227 11/29/24 0647      Subjective: Patient overall feels better.  Concerned about her low cortisol level.  Then ambulated yet in the hallway.   Objective: Vitals:   12/03/24 0604 12/03/24 1444 12/03/24 2138 12/04/24 0518  BP: (!) 155/77 (!) 159/77 138/60 133/63  Pulse: 86 87 88 88  Resp: 19  18 18   Temp: (!) 97.5 F (36.4 C) 98.3 F (36.8 C) 98.2 F (36.8 C) 98.1 F (36.7 C)  TempSrc: Oral Oral Oral Oral  SpO2: 97% 98% 99% 99%  Weight:      Height:        Intake/Output Summary (Last 24 hours) at 12/04/2024 1012 Last data filed at 12/03/2024 2252 Gross per 24 hour  Intake --  Output 30 ml  Net -30 ml   Filed Weights   11/29/24 0352  Weight: (!) 199.6 kg    Examination:  General appearance: Awake alert.  In no distress Resp: Clear to auscultation bilaterally.  Normal effort Cardio: S1-S2 is normal regular.  No S3-S4.  No rubs murmurs or bruit GI: Abdomen is soft.  Nontender nondistended.  Bowel sounds are present normal.  No masses organomegaly Significant lower extremity lymphedema noted  Data Reviewed: I have personally reviewed following labs and imaging studies  CBC: Recent Labs  Lab 11/28/24 1817 11/29/24 0109 11/30/24 0407 12/01/24 0419 12/03/24 0356  WBC 11.8* 13.1* 8.0 8.1 7.6  NEUTROABS 9.5*  --   --   --   --   HGB 10.1* 10.2* 9.2* 9.2* 9.2*  HCT 32.4* 34.0* 30.4* 31.2* 30.3*  MCV 82.2 85.0 84.0 86.7  85.6  PLT PLATELET CLUMPS NOTED ON SMEAR, COUNT APPEARS ADEQUATE 415* 373 373 415*   Basic Metabolic Panel: Recent Labs  Lab 11/28/24 1817 11/28/24 2234 11/29/24 0109 11/30/24 0407 12/01/24 0419 12/03/24 0356  NA 138  --  137 135 136 136  K 3.6  --  3.6 4.0 4.0 4.5  CL 110  --  105 102 103 103  CO2 20*  --  19* 21* 21* 22  GLUCOSE 130*  --  176* 151* 185* 183*  BUN 16  --  17 21* 18 19  CREATININE 0.81  --  0.93 0.74 0.84 0.89  CALCIUM  7.2*  --  8.0* 9.4 8.9 9.1  MG  --  1.5*  --  1.9 2.0 1.9  PHOS  --   --   --  4.6 4.2  --  GFR: Estimated Creatinine Clearance: 134.5 mL/min (by C-G formula based on SCr of 0.89 mg/dL). Liver Function Tests: Recent Labs  Lab 11/28/24 1817 11/29/24 0109 11/30/24 0407 12/03/24 0356  AST 19 21  --  22  ALT 11 12  --  6  ALKPHOS 63 71  --  74  BILITOT 0.9 0.7  --  0.4  PROT 6.6 7.0  --  7.4  ALBUMIN 2.4* 2.7* 3.3* 3.3*    Coagulation Profile: Recent Labs  Lab 11/29/24 0109  INR 1.8*   CBG: Recent Labs  Lab 12/03/24 0740 12/03/24 1108 12/03/24 1631 12/03/24 2139 12/04/24 0730  GLUCAP 179* 221* 187* 242* 199*    Sepsis Labs: Recent Labs  Lab 11/28/24 1833 11/28/24 2156 11/28/24 2234 11/29/24 1003 11/30/24 0407  PROCALCITON  --   --  <0.10  --   --   LATICACIDVEN 2.7* 3.0*  --  3.2* 1.0    Recent Results (from the past 240 hours)  Urine Culture (for pregnant, neutropenic or urologic patients or patients with an indwelling urinary catheter)     Status: Abnormal   Collection Time: 11/28/24 10:56 PM   Specimen: Urine, Clean Catch  Result Value Ref Range Status   Specimen Description URINE, CLEAN CATCH  Final   Special Requests   Final    ADDED 2256 Performed at [facility], [address], [place], [place] [address]    Culture >=100,000 COLONIES/mL PROTEUS PENNERI (A)  Final   Report Status 12/01/2024 FINAL  Final   Organism ID, Bacteria PROTEUS PENNERI (A)  Final      Susceptibility   Proteus penneri -  MIC*    AMPICILLIN  >=32 RESISTANT Resistant     CEFEPIME <=0.12 SENSITIVE Sensitive     ERTAPENEM <=0.12 SENSITIVE Sensitive     CEFTRIAXONE  >=64 RESISTANT Resistant     CIPROFLOXACIN <=0.06 SENSITIVE Sensitive     GENTAMICIN <=1 SENSITIVE Sensitive     NITROFURANTOIN 128 RESISTANT Resistant     TRIMETH/SULFA <=20 SENSITIVE Sensitive     AMPICILLIN /SULBACTAM >=32 RESISTANT Resistant     PIP/TAZO Value in next row Sensitive      <=4 SENSITIVEThis is a modified FDA-approved test that has been validated and its performance characteristics determined by the reporting laboratory.  This laboratory is certified under the Clinical Laboratory Improvement Amendments CLIA as qualified to perform high complexity clinical laboratory testing.    MEROPENEM Value in next row Sensitive      <=4 SENSITIVEThis is a modified FDA-approved test that has been validated and its performance characteristics determined by the reporting laboratory.  This laboratory is certified under the Clinical Laboratory Improvement Amendments CLIA as qualified to perform high complexity clinical laboratory testing.    * >=100,000 COLONIES/mL PROTEUS PENNERI    Radiology Studies: No results found.  Scheduled Meds:  amoxicillin -clavulanate  1 tablet Oral BID   diclofenac  Sodium  4 g Topical QID   insulin  aspart  0-9 Units Subcutaneous TID WC   midodrine   5 mg Oral TID WC   rivaroxaban   20 mg Oral Q supper   saccharomyces boulardii  250 mg Oral BID   Continuous Infusions:   LOS: 6 days    Joette Pebbles, MD [facility]   If 7PM-7AM, please contact night-coverage

## 2024-12-04 NOTE — Progress Notes (Addendum)
 Physical Therapy Treatment Patient Details Name: Susan Warren MRN: [id number] DOB: [birth year] Today's Date: 12/04/2024   History of Present Illness Pt is 48 yo female admitted 11/28/24 after syncopal episode. Pt with hx including but not limited to DVT, GERD, HTN, lymphedema bil LE, migraines, morbid obexity with BMI>70, PE, hospitalization in Aug 25 for hepatic cyst with drain placement    PT Comments  Pt able to amb 45' x 2, seated rest between distances d/t elevated HR at 140 max with activity. Some c/o of dyspnea, denies dizziness. Pt pleased to be able to amb longer distance today.    Positional VS ---  12/04/24 1300  Orthostatic Lying   BP- Lying 132/64 (map 82)  Pulse- Lying 93  Orthostatic Sitting  BP- Sitting (!) 152/91 (map107)  Pulse- Sitting 102  Orthostatic Standing at 0 minutes  BP- Standing at 0 minutes 148/89 (map 106)  Pulse- Standing at 0 minutes 125      If plan is discharge home, recommend the following: Assist for transportation;Help with stairs or ramp for entrance   Can travel by private vehicle        Equipment Recommendations  None recommended by PT    Recommendations for Other Services       Precautions / Restrictions Precautions Precautions: Fall Precaution/Restrictions Comments: watch BP, + orthostatics with MS 12/11 Restrictions Weight Bearing Restrictions Per Provider Order: No     Mobility  Bed Mobility Overal bed mobility: Modified Independent Bed Mobility: Supine to Sit, Sit to Supine     Supine to sit: Modified independent (Device/Increase time) Sit to supine: Modified independent (Device/Increase time)   General bed mobility comments: self able with HOB elevated and use of rails.  Also uses a strap to lift R LE up back onto bed.    Transfers Overall transfer level: Modified independent Equipment used: None Transfers: Sit to/from Stand Sit to Stand: Supervision           General transfer comment: Pt self  able to rise and lower no AD from elevated surface with forward weight shift due to body habitus.  Good safety awareness.    Ambulation/Gait Ambulation/Gait assistance: Supervision Gait Distance (Feet): 45 Feet (x2) Assistive device: None Gait Pattern/deviations: Step-through pattern, Decreased stride length, Wide base of support Gait velocity: decreased     General Gait Details: pt denied dizziness with amb, fatigues requiring standing and then seated therapeutic rest on PT advice d/t elevated HR  140 max. after brief rest pt was able to continue and  amb back to room. no LOB. supervision for safety   Stairs             Wheelchair Mobility     Tilt Bed    Modified Rankin (Stroke Patients Only)       Balance   Sitting-balance support: No upper extremity supported, Feet supported Sitting balance-Leahy Scale: Good     Standing balance support: No upper extremity supported Standing balance-Leahy Scale: Fair                              Hotel Manager: No apparent difficulties  Cognition Arousal: Alert Behavior During Therapy: WFL for tasks assessed/performed   PT - Cognitive impairments: No apparent impairments                         Following commands: Intact      Cueing Cueing Techniques:  Verbal cues  Exercises General Exercises - Lower Extremity Ankle Circles/Pumps: AROM, Both, 10 reps, Seated    General Comments General comments (skin integrity, edema, etc.): orthostatic VS taken and placed in flowsheets; discussed safety at home slowing speed of movement/transitions and self monitoring for dizziness. encouraged LE exercises priro to sitting/standing      Pertinent Vitals/Pain Pain Assessment Pain Assessment: No/denies pain    Home Living                          Prior Function            PT Goals (current goals can now be found in the care plan section) Acute Rehab PT  Goals Patient Stated Goal: go home soon PT Goal Formulation: With patient Time For Goal Achievement: 12/13/24 Potential to Achieve Goals: Good Progress towards PT goals: Progressing toward goals    Frequency    Min 3X/week      PT Plan      Co-evaluation              AM-PAC PT 6 Clicks Mobility   Outcome Measure  Help needed turning from your back to your side while in a flat bed without using bedrails?: None Help needed moving from lying on your back to sitting on the side of a flat bed without using bedrails?: None Help needed moving to and from a bed to a chair (including a wheelchair)?: None Help needed standing up from a chair using your arms (e.g., wheelchair or bedside chair)?: None Help needed to walk in hospital room?: None Help needed climbing 3-5 steps with a railing? : A Little 6 Click Score: 23    End of Session Equipment Utilized During Treatment: Gait belt Activity Tolerance: Patient tolerated treatment well Patient left: in bed;with call bell/phone within reach;with bed alarm set Nurse Communication: Mobility status PT Visit Diagnosis: Other abnormalities of gait and mobility (R26.89);Dizziness and giddiness (R42)     Time: 8596-8573 PT Time Calculation (min) (ACUTE ONLY): 23 min  Charges:    $Gait Training: 8-22 mins $Therapeutic Activity: 8-22 mins PT General Charges $$ ACUTE PT VISIT: 1 Visit                     Gurshan Settlemire, PT  Acute Rehab Dept [facility]) [phone]  12/04/2024    [facility] 12/04/2024, 3:21 PM

## 2024-12-05 ENCOUNTER — Other Ambulatory Visit (HOSPITAL_COMMUNITY): Payer: Self-pay

## 2024-12-05 ENCOUNTER — Other Ambulatory Visit: Payer: Self-pay | Admitting: Family Medicine

## 2024-12-05 LAB — GLUCOSE, CAPILLARY
Glucose-Capillary: 170 mg/dL — ABNORMAL HIGH (ref 70–99)
Glucose-Capillary: 184 mg/dL — ABNORMAL HIGH (ref 70–99)

## 2024-12-05 MED ORDER — METFORMIN HCL ER 500 MG PO TB24
500.0000 mg | ORAL_TABLET | Freq: Every day | ORAL | 0 refills | Status: DC
  Filled 2024-12-05 (×2): qty 30, 30d supply, fill #0

## 2024-12-05 NOTE — Discharge Summary (Signed)
 Physician Discharge Summary  Susan Warren FMW:[id number] DOB: 1976-03-10 DOA: 11/28/2024  PCP: Susan Bleacher, MD  Admit date: 11/28/2024 Discharge date: 12/05/2024  Admitted From: Home Disposition:  Home  Recommendations for Outpatient Follow-up:  Follow up with PCP in 1-2 weeks Please obtain BMP/CBC in one week   Home Health:No  Equipment/Devices:None  Discharge Condition:Stable CODE STATUS:Full Diet recommendation: Heart Healthy   Brief/Interim Summary: 48 years old Female with PMH significant for morbid obesity, lymphedema, DVT/PE on Xarelto , HTN, GERD, migraine headache, hepatic abscess s/p drain placement currently on Augmentin  presenting with syncopal episodes and palpitations. Patient has had intermittent near syncope over the last 2 months.  She lost consciousness while on commode on the day of presentation.  Denies fall. In ED, HR ranges from 112-138.  WBC 11.8 with left shift.  CMP without significant finding.  Lactic acid 2.7.  UA with pyuria, microscopic hematuria and bacteria.  Pro-Cal negative.  Serial troponin and BNP negative.  EKG showed sinus tachycardia to 110.  CT angio chest without large central PE but suggested cirrhosis and possible PAH.  CT A/P with grossly stable multiloculated partially calcified chronic 13 x 7 cm right inferior hepatic lobe subhepatic gas and fluid collection with an indwelling surgical drain, and hepatosplenomegaly.   Discharge Diagnoses:  Principal Problem:   Syncope  Syncope/orthostatic hypotension: The patient had recurrent intermittent palpitations and near syncope. CTA of the chest was negative for PE but it did show liver cirrhosis. CT of the head showed no acute findings. She was monitored on telemetry with no events.  She was sinus tachycardia on twelve-lead EKG. 2D echo showed an EF of 60% no wall motion abnormality. Orthostatics were positive. Patient has significant extremity lymphedema so compression stockings  cannot be used. Patient no antihypertensive medication. She was placed on low-dose midodrine  with improvement in her symptoms. Cortisol level was 5.5 stim test was negative. TSH unremarkable she will go home with midodrine .  History of DVT/PE: Continue Xarelto .  Pyuria/bacteriuria/microscopic hematuria: She remains asymptomatic with no urinary symptoms. She remains on Augmentin  for hepatic abscess.  Hepatic abscess/status post drain insertion: CT scan of the abdomen pelvis confirmed hepatic abscess she was started empirically on antibiotics infectious ease and IR were consulted drain was placed. ID recommends to continue Augmentin  as an outpatient. Follow-up with drain clinic on 12/10/2024.  Possible liver cirrhosis/hepatomegaly: Follow-up with GI as an outpatient.  Lactic acidosis likely due to dehydration: Now resolved.  Lymphedema/bilateral lower extremities: Follow-up with PCP as an outpatient.  Normocytic anemia: Hemoglobin is relatively stable.  Diabetes mellitus type 2 with hyperglycemia: DM no meds A1c of 6.8.  She was started on metformin  low-dose will continue as an outpatient. Will follow-up with PCP as an outpatient.  Hypomagnesemia: Repleted now improved.  Morbid obesity: Noted.  Discharge Instructions  Discharge Instructions     Increase activity slowly   Complete by: As directed       Allergies as of 12/05/2024       Reactions   Adhesive [tape] Anaphylaxis, Swelling   *Adhesive Spray*         Medication List     STOP taking these medications    diclofenac  Sodium 1 % Gel Commonly known as: VOLTAREN    Normal Saline Flush 0.9 % Soln   ondansetron  4 MG disintegrating tablet Commonly known as: ZOFRAN -ODT   senna-docusate 8.6-50 MG tablet Commonly known as: Senokot-S       TAKE these medications    acetaminophen  325 MG tablet Commonly known as: TYLENOL   Take 2 tablets (650 mg total) by mouth every 6 (six) hours as needed for  mild pain (or Fever >/= 101).   amoxicillin -clavulanate 875-125 MG tablet Commonly known as: AUGMENTIN  TAKE 1 TABLET BY MOUTH TWICE A DAY   cyanocobalamin  1000 MCG tablet Commonly known as: CVS VITAMIN B12 Take 1 tablet (1,000 mcg total) by mouth daily. What changed: Another medication with the same name was removed. Continue taking this medication, and follow the directions you see here.   metformin  500 MG (OSM) 24 hr tablet Commonly known as: FORTAMET  Take 1 tablet (500 mg total) by mouth daily with breakfast.   midodrine  5 MG tablet Commonly known as: PROAMATINE  Take 1 tablet (5 mg total) by mouth 3 (three) times daily with meals.   Xarelto  20 MG Tabs tablet Generic drug: rivaroxaban  TAKE 1 TABLET BY MOUTH DAILY WITH SUPPER.        Allergies[1]  Consultations: Infectious disease Interventional radiology   Procedures/Studies: CT HEAD WO CONTRAST ( ) Result Date: 12/01/2024 EXAM: CT HEAD WITHOUT 12/01/2024 11:25:00 AM TECHNIQUE: CT of the head was performed without the administration of intravenous contrast. Automated exposure control, iterative reconstruction, and/or weight based adjustment of the mA/kV was utilized to reduce the radiation dose to as low as reasonably achievable. COMPARISON: None available. CLINICAL HISTORY: Syncope/presyncope, cerebrovascular cause suspected. FINDINGS: BRAIN AND VENTRICLES: No acute intracranial hemorrhage. No mass effect or midline shift. No extra-axial fluid collection. No evidence of acute infarct. No hydrocephalus. ORBITS: No acute abnormality. SINUSES AND MASTOIDS: No acute abnormality. SOFT TISSUES AND SKULL: No acute skull fracture. No acute soft tissue abnormality. IMPRESSION: 1. No acute intracranial abnormality. Electronically signed by: Donnice Mania MD 12/01/2024 12:08 PM EST RP Workstation: [id number]   ECHOCARDIOGRAM COMPLETE Result Date: 11/29/2024    ECHOCARDIOGRAM REPORT   Patient Name:   Susan Warren Date of Exam:  11/29/2024 Medical Rec #:  [id number]            Height:       64.0 in Accession #:    [id number]           Weight:       440.0 lb Date of Birth:  1976/09/11            BSA:          2.735 m Patient Age:    48 years             BP:           153/90 mmHg Patient Gender: F                    HR:           95 bpm. Exam Location:  Inpatient Procedure: 2D Echo and Intracardiac Opacification Agent (Both Spectral and Color            Flow Doppler were utilized during procedure). Indications:    Sycope  History:        Patient has prior history of Echocardiogram examinations.  Sonographer:    Norleen Amour Referring Phys: [id number] SARA-MAIZ A THOMAS IMPRESSIONS  1. Left ventricular ejection fraction, by estimation, is 70 to 75%. Left ventricular ejection fraction by 2D MOD biplane is 73.8 %. The left ventricle has hyperdynamic function. The left ventricle has no regional wall motion abnormalities. There is mild  left ventricular hypertrophy. Left ventricular diastolic parameters are consistent with Grade I diastolic dysfunction (impaired relaxation).  2. Right ventricular systolic function is normal. The right  ventricular size is normal.  3. The mitral valve is degenerative. Trivial mitral valve regurgitation.  4. The aortic valve was not well visualized. Aortic valve regurgitation is not visualized. Aortic valve sclerosis/calcification is present, without any evidence of aortic stenosis. Aortic valve mean gradient measures 6.0 mmHg.  5. The inferior vena cava is normal in size with greater than 50% respiratory variability, suggesting right atrial pressure of 3 mmHg. Comparison(s): Changes from prior study are noted. 08/12/2024: LVEF 60-65%. FINDINGS  Left Ventricle: Left ventricular ejection fraction, by estimation, is 70 to 75%. Left ventricular ejection fraction by 2D MOD biplane is 73.8 %. The left ventricle has hyperdynamic function. The left ventricle has no regional wall motion abnormalities. The left ventricular  internal cavity size was normal in size. There is mild left ventricular hypertrophy. Left ventricular diastolic parameters are consistent with Grade I diastolic dysfunction (impaired relaxation). Indeterminate filling pressures. Right Ventricle: The right ventricular size is normal. No increase in right ventricular wall thickness. Right ventricular systolic function is normal. Left Atrium: Left atrial size was normal in size. Right Atrium: Right atrial size was normal in size. Pericardium: There is no evidence of pericardial effusion. Mitral Valve: The mitral valve is degenerative in appearance. Mild mitral annular calcification. Trivial mitral valve regurgitation. MV peak gradient, 4.2 mmHg. The mean mitral valve gradient is 2.0 mmHg. Tricuspid Valve: The tricuspid valve is grossly normal. Tricuspid valve regurgitation is trivial. Aortic Valve: The aortic valve was not well visualized. Aortic valve regurgitation is not visualized. Aortic valve sclerosis/calcification is present, without any evidence of aortic stenosis. Aortic valve mean gradient measures 6.0 mmHg. Aortic valve peak gradient measures 10.5 mmHg. Aortic valve area, by VTI measures 3.50 cm. Pulmonic Valve: The pulmonic valve was grossly normal. Pulmonic valve regurgitation is trivial. Aorta: The aortic root and ascending aorta are structurally normal, with no evidence of dilitation. Venous: The inferior vena cava is normal in size with greater than 50% respiratory variability, suggesting right atrial pressure of 3 mmHg. IAS/Shunts: No atrial level shunt detected by color flow Doppler.  LEFT VENTRICLE PLAX 2D                        Biplane EF (MOD) LVIDd:         5.00 cm         LV Biplane EF:   Left LVIDs:         3.20 cm                          ventricular LV PW:         1.20 cm                          ejection LV IVS:        0.80 cm                          fraction by LVOT diam:     2.10 cm                          2D MOD LV SV:         89                                biplane is  LV SV Index:   33                               73.8 %. LVOT Area:     3.46 cm                                Diastology                                LV e' medial:    7.29 cm/s LV Volumes (MOD)               LV E/e' medial:  14.0 LV vol d, MOD    61.7 ml       LV e' lateral:   7.62 cm/s A2C:                           LV E/e' lateral: 13.4 LV vol d, MOD    152.0 ml A4C: LV vol s, MOD    24.6 ml A2C: LV vol s, MOD    30.2 ml A4C: LV SV MOD A2C:   37.1 ml LV SV MOD A4C:   152.0 ml LV SV MOD BP:    76.5 ml RIGHT VENTRICLE RV Basal diam:  2.90 cm RV S prime:     25.00 cm/s LEFT ATRIUM             Index        RIGHT ATRIUM           Index LA diam:        3.80 cm 1.39 cm/m   RA Area:     13.80 cm LA Vol (A2C):   45.0 ml 16.45 ml/m  RA Volume:   31.40 ml  11.48 ml/m LA Vol (A4C):   40.5 ml 14.81 ml/m LA Biplane Vol: 43.4 ml 15.87 ml/m  AORTIC VALVE                     PULMONIC VALVE AV Area (Vmax):    2.50 cm      PV Vmax:       1.82 m/s AV Area (Vmean):   2.46 cm      PV Peak grad:  13.2 mmHg AV Area (VTI):     3.50 cm AV Vmax:           162.00 cm/s AV Vmean:          118.000 cm/s AV VTI:            0.255 m AV Peak Grad:      10.5 mmHg AV Mean Grad:      6.0 mmHg LVOT Vmax:         117.00 cm/s LVOT Vmean:        83.800 cm/s LVOT VTI:          0.258 m LVOT/AV VTI ratio: 1.01  AORTA Ao Root diam: 2.70 cm Ao Asc diam:  3.40 cm MITRAL VALVE MV Area (PHT): 6.54 cm     SHUNTS MV Area VTI:   4.67 cm     Systemic VTI:  0.26 m MV Peak grad:  4.2 mmHg     Systemic Diam: 2.10 cm MV Mean grad:  2.0 mmHg MV Vmax:  1.03 m/s MV Vmean:      67.2 cm/s MV Decel Time: 116 msec MV E velocity: 102.00 cm/s MV A velocity: 106.00 cm/s MV E/A ratio:  0.96 Vinie Maxcy MD Electronically signed by Vinie Maxcy MD Signature Date/Time: 11/29/2024/5:10:08 PM    Final    CT Angio Chest Pulmonary Embolism (PE) W or WO Contrast Result Date: 11/29/2024 CLINICAL DATA:  Syncope. EXAM: CT ANGIOGRAPHY  CHEST WITH CONTRAST TECHNIQUE: Multidetector CT imaging of the chest was performed using the standard protocol during bolus administration of intravenous contrast. Multiplanar CT image reconstructions and MIPs were obtained to evaluate the vascular anatomy. RADIATION DOSE REDUCTION: This exam was performed according to the departmental dose-optimization program which includes automated exposure control, adjustment of the mA and/or kV according to patient size and/or use of iterative reconstruction technique. CONTRAST:  75mL OMNIPAQUE  IOHEXOL  350 MG/ML SOLN COMPARISON:  04/08/2022. FINDINGS: Cardiovascular: Image quality is degraded by respiratory motion, limiting evaluation of the segmental and subsegmental pulmonary arteries. No central or lobar pulmonary embolus. Atherosclerotic calcification of the aorta. Enlarged pulmonic trunk and heart. No pericardial effusion. Mediastinum/Nodes: No pathologically enlarged mediastinal, hilar or axillary lymph nodes. No pathologically enlarged mediastinal, hilar or axillary lymph nodes. Esophagus is grossly unremarkable. Distal periesophageal lymph nodes are not enlarged by CT size criteria. Lungs/Pleura: Image quality is degraded by expiratory phase imaging and respiratory motion. No pleural fluid. Airway is otherwise unremarkable. Upper Abdomen: Liver margin is irregular. Small right adrenal adenoma. No specific follow-up necessary. Visualized portions of the liver, adrenal glands, left kidney, spleen, pancreas, stomach and bowel are otherwise grossly unremarkable. No upper abdominal adenopathy. Musculoskeletal: Degenerative changes in the spine. Review of the MIP images confirms the above findings. IMPRESSION: 1. Image quality is degraded by respiratory motion, limiting the evaluation of segmental and subsegmental pulmonary arteries. No central or lobar pulmonary embolus. 2. Cirrhosis. 3. Small right adrenal adenoma. 4.  Aortic atherosclerosis (ICD10-I70.0). 5. Enlarged  pulmonic trunk, indicative of pulmonary arterial hypertension. Electronically Signed   By: Newell Eke M.D.   On: 11/29/2024 09:04   CT ABDOMEN PELVIS W CONTRAST Result Date: 11/28/2024 EXAM: CT ABDOMEN AND PELVIS WITH CONTRAST 11/28/2024 08:30:00 PM TECHNIQUE: CT of the abdomen and pelvis was performed with the administration of 100 mL of iohexol  (OMNIPAQUE ) 350 MG/ML injection. Multiplanar reformatted images are provided for review. Automated exposure control, iterative reconstruction, and/or weight-based adjustment of the mA/kV was utilized to reduce the radiation dose to as low as reasonably achievable. COMPARISON: 11/17/2024, 08/22/2024, 04/16/2022. CLINICAL HISTORY: Abdominal pain, acute, nonlocalized; hx hepatic abscess. FINDINGS: LOWER CHEST: Mitral annular calcification. LIVER: The liver is enlarged, measuring up to 21 cm. Redemonstration of the grossly stable multiloculated partially calcified chronic 13 x 7 cm right inferior hepatic lobe subhepatic gas and fluid complex lesion. Findings abuts the hepatic flexure. A surgical drain with a pigtail within the collection is again noted. GALLBLADDER AND BILE DUCTS: Gallbladder is unremarkable. No biliary ductal dilatation. SPLEEN: The spleen is enlarged, measuring up to 14 cm. PANCREAS: No acute abnormality. ADRENAL GLANDS: No acute abnormality. KIDNEYS, URETERS AND BLADDER: No stones in the kidneys or ureters. No hydronephrosis. No perinephric or periureteral stranding. No filling defects of the partially visualized collecting systems on delayed imaging. Urinary bladder is unremarkable. GI AND BOWEL: Stomach demonstrates no acute abnormality. No small or large bowel thickening or dilatation. The appendix is unremarkable. Stool throughout the ascending colon and proximal to mid transverse colon. There is no bowel obstruction. PERITONEUM AND RETROPERITONEUM: No ascites. No  free air. VASCULATURE: Aorta is normal in caliber. Mild atherosclerotic plaque.  LYMPH NODES: No lymphadenopathy. REPRODUCTIVE ORGANS: The uterus and bilateral adnexal regions are unremarkable. BONES AND SOFT TISSUES: No acute osseous abnormality. No focal soft tissue abnormality. IMPRESSION: 1. Grossly stable multiloculated partially calcified chronic 13 x 7 cm right inferior hepatic lobe subhepatic gas and fluid collection with an indwelling surgical drain. Collection abuts the hepatic flexure. 2. Hepatosplenomegaly. Electronically signed by: Morgane Naveau MD 11/28/2024 09:07 PM EST RP Workstation: [id number]   DG Chest Port 1 View Result Date: 11/28/2024 CLINICAL DATA:  Syncope EXAM: PORTABLE CHEST 1 VIEW COMPARISON:  08/12/2024 FINDINGS: The heart size and mediastinal contours are within normal limits. Both lungs are clear. The visualized skeletal structures are unremarkable. IMPRESSION: No active disease. Electronically Signed   By: Ozell Daring M.D.   On: 11/28/2024 18:35   CT ABDOMEN PELVIS W CONTRAST Result Date: 11/21/2024 CLINICAL DATA:  Follow-up sub hepatic abscess. Status post percutaneous catheter drainage. EXAM: CT ABDOMEN WITH CONTRAST TECHNIQUE: Multidetector CT imaging of the abdomen was performed using the standard protocol following bolus administration of intravenous contrast. RADIATION DOSE REDUCTION: This exam was performed according to the departmental dose-optimization program which includes automated exposure control, adjustment of the mA and/or kV according to patient size and/or use of iterative reconstruction technique. CONTRAST:  100mL ISOVUE -300 IOPAMIDOL  (ISOVUE -300) INJECTION 61% COMPARISON:  09/29/2024 FINDINGS: Lower chest: No acute findings. Hepatobiliary: No intrahepatic hepatic mass or abscess identified. Surgical clips are seen from prior cholecystectomy. No evidence of biliary ductal dilatation. A complex thick walled fluid collection containing a small amount of gas is again seen in the subhepatic space, with percutaneous drainage catheter along  its inferior margin. This currently measures 12.7 x 7.6 cm on image 45/2, without significant change since prior study. Pancreas:  No mass or inflammatory changes. Spleen:  Within normal limits in size and appearance. Adrenals/Urinary Tract: No suspicious masses identified. No evidence of hydronephrosis. Stomach/Bowel: Unremarkable. Vascular/Lymphatic: Mild retroperitoneal and bilateral iliac lymphadenopathy remains stable and is likely reactive in etiology. No acute vascular findings. Other:  None. Musculoskeletal:  No suspicious bone lesions identified. IMPRESSION: No significant change in large subhepatic fluid and gas collection, with percutaneous drainage catheter along its inferior margin. Stable mild retroperitoneal and bilateral iliac lymphadenopathy, likely reactive in etiology. Electronically Signed   By: Norleen DELENA Kil M.D.   On: 11/21/2024 12:21   (Echo, Carotid, EGD, Colonoscopy, ERCP)    Subjective: No complaints.  Discharge Exam: Vitals:   12/04/24 2106 12/05/24 0541  BP: 116/71 128/69  Pulse: 93 87  Resp: 19 19  Temp: 98.2 F (36.8 C) 97.7 F (36.5 C)  SpO2: 98% 94%   Vitals:   12/04/24 0518 12/04/24 1432 12/04/24 2106 12/05/24 0541  BP: 133/63 (!) 155/72 116/71 128/69  Pulse: 88 93 93 87  Resp: 18 20 19 19   Temp: 98.1 F (36.7 C) 98.2 F (36.8 C) 98.2 F (36.8 C) 97.7 F (36.5 C)  TempSrc: Oral Oral Oral Oral  SpO2: 99% 98% 98% 94%  Weight:      Height:        General: Pt is alert, awake, not in acute distress Cardiovascular: RRR, S1/S2 +, no rubs, no gallops Respiratory: CTA bilaterally, no wheezing, no rhonchi Abdominal: Soft, NT, ND, bowel sounds + Extremities: no edema, no cyanosis    The results of significant diagnostics from this hospitalization (including imaging, microbiology, ancillary and laboratory) are listed below for reference.     Microbiology: Recent Results (  from the past 240 hours)  Urine Culture (for pregnant, neutropenic or urologic  patients or patients with an indwelling urinary catheter)     Status: Abnormal   Collection Time: 11/28/24 10:56 PM   Specimen: Urine, Clean Catch  Result Value Ref Range Status   Specimen Description URINE, CLEAN CATCH  Final   Special Requests   Final    ADDED 2256 Performed at [facility], [address] [place], [place] [address]    Culture >=100,000 COLONIES/mL PROTEUS PENNERI (A)  Final   Report Status 12/01/2024 FINAL  Final   Organism ID, Bacteria PROTEUS PENNERI (A)  Final      Susceptibility   Proteus penneri - MIC*    AMPICILLIN  >=32 RESISTANT Resistant     CEFEPIME <=0.12 SENSITIVE Sensitive     ERTAPENEM <=0.12 SENSITIVE Sensitive     CEFTRIAXONE  >=64 RESISTANT Resistant     CIPROFLOXACIN <=0.06 SENSITIVE Sensitive     GENTAMICIN <=1 SENSITIVE Sensitive     NITROFURANTOIN 128 RESISTANT Resistant     TRIMETH/SULFA <=20 SENSITIVE Sensitive     AMPICILLIN /SULBACTAM >=32 RESISTANT Resistant     PIP/TAZO Value in next row Sensitive      <=4 SENSITIVEThis is a modified FDA-approved test that has been validated and its performance characteristics determined by the reporting laboratory.  This laboratory is certified under the Clinical Laboratory Improvement Amendments CLIA as qualified to perform high complexity clinical laboratory testing.    MEROPENEM Value in next row Sensitive      <=4 SENSITIVEThis is a modified FDA-approved test that has been validated and its performance characteristics determined by the reporting laboratory.  This laboratory is certified under the Clinical Laboratory Improvement Amendments CLIA as qualified to perform high complexity clinical laboratory testing.    * >=100,000 COLONIES/mL PROTEUS PENNERI     Labs: BNP (last 3 results) Recent Labs    11/28/24 2234  BNP 34.7   Basic Metabolic Panel: Recent Labs  Lab 11/28/24 1817 11/28/24 2234 11/29/24 0109 11/30/24 0407 12/01/24 0419 12/03/24 0356  NA 138  --  137 135 136 136  K 3.6   --  3.6 4.0 4.0 4.5  CL 110  --  105 102 103 103  CO2 20*  --  19* 21* 21* 22  GLUCOSE 130*  --  176* 151* 185* 183*  BUN 16  --  17 21* 18 19  CREATININE 0.81  --  0.93 0.74 0.84 0.89  CALCIUM  7.2*  --  8.0* 9.4 8.9 9.1  MG  --  1.5*  --  1.9 2.0 1.9  PHOS  --   --   --  4.6 4.2  --    Liver Function Tests: Recent Labs  Lab 11/28/24 1817 11/29/24 0109 11/30/24 0407 12/03/24 0356  AST 19 21  --  22  ALT 11 12  --  6  ALKPHOS 63 71  --  74  BILITOT 0.9 0.7  --  0.4  PROT 6.6 7.0  --  7.4  ALBUMIN 2.4* 2.7* 3.3* 3.3*   No results for input(s): LIPASE, AMYLASE in the last 168 hours. No results for input(s): AMMONIA in the last 168 hours. CBC: Recent Labs  Lab 11/28/24 1817 11/29/24 0109 11/30/24 0407 12/01/24 0419 12/03/24 0356  WBC 11.8* 13.1* 8.0 8.1 7.6  NEUTROABS 9.5*  --   --   --   --   HGB 10.1* 10.2* 9.2* 9.2* 9.2*  HCT 32.4* 34.0* 30.4* 31.2* 30.3*  MCV  82.2 85.0 84.0 86.7 85.6  PLT PLATELET CLUMPS NOTED ON SMEAR, COUNT APPEARS ADEQUATE 415* 373 373 415*   Cardiac Enzymes: No results for input(s): CKTOTAL, CKMB, CKMBINDEX, TROPONINI in the last 168 hours. BNP: Invalid input(s): POCBNP CBG: Recent Labs  Lab 12/04/24 0730 12/04/24 1142 12/04/24 1651 12/04/24 2103 12/05/24 0820  GLUCAP 199* 227* 208* 250* 170*   D-Dimer No results for input(s): DDIMER in the last 72 hours. Hgb A1c No results for input(s): HGBA1C in the last 72 hours. Lipid Profile No results for input(s): CHOL, HDL, LDLCALC, TRIG, CHOLHDL, LDLDIRECT in the last 72 hours. Thyroid function studies No results for input(s): TSH, T4TOTAL, T3FREE, THYROIDAB in the last 72 hours.  Invalid input(s): FREET3 Anemia work up No results for input(s): VITAMINB12, FOLATE, FERRITIN, TIBC, IRON, RETICCTPCT in the last 72 hours. Urinalysis    Component Value Date/Time   COLORURINE AMBER (A) 11/28/2024 1817   APPEARANCEUR TURBID (A)  11/28/2024 1817   LABSPEC >1.046 (H) 11/28/2024 1817   PHURINE 6.0 11/28/2024 1817   GLUCOSEU NEGATIVE 11/28/2024 1817   GLUCOSEU NEGATIVE 01/25/2014 1049   HGBUR LARGE (A) 11/28/2024 1817   BILIRUBINUR NEGATIVE 11/28/2024 1817   KETONESUR NEGATIVE 11/28/2024 1817   PROTEINUR 100 (A) 11/28/2024 1817   UROBILINOGEN 0.2 01/25/2014 1049   NITRITE NEGATIVE 11/28/2024 1817   LEUKOCYTESUR MODERATE (A) 11/28/2024 1817   Sepsis Labs Recent Labs  Lab 11/29/24 0109 11/30/24 0407 12/01/24 0419 12/03/24 0356  WBC 13.1* 8.0 8.1 7.6   Microbiology Recent Results (from the past 240 hours)  Urine Culture (for pregnant, neutropenic or urologic patients or patients with an indwelling urinary catheter)     Status: Abnormal   Collection Time: 11/28/24 10:56 PM   Specimen: Urine, Clean Catch  Result Value Ref Range Status   Specimen Description URINE, CLEAN CATCH  Final   Special Requests   Final    ADDED 2256 Performed at [facility] - [facility], [address], [place], [place] [address]    Culture >=100,000 COLONIES/mL PROTEUS PENNERI (A)  Final   Report Status 12/01/2024 FINAL  Final   Organism ID, Bacteria PROTEUS PENNERI (A)  Final      Susceptibility   Proteus penneri - MIC*    AMPICILLIN  >=32 RESISTANT Resistant     CEFEPIME <=0.12 SENSITIVE Sensitive     ERTAPENEM <=0.12 SENSITIVE Sensitive     CEFTRIAXONE  >=64 RESISTANT Resistant     CIPROFLOXACIN <=0.06 SENSITIVE Sensitive     GENTAMICIN <=1 SENSITIVE Sensitive     NITROFURANTOIN 128 RESISTANT Resistant     TRIMETH/SULFA <=20 SENSITIVE Sensitive     AMPICILLIN /SULBACTAM >=32 RESISTANT Resistant     PIP/TAZO Value in next row Sensitive      <=4 SENSITIVEThis is a modified FDA-approved test that has been validated and its performance characteristics determined by the reporting laboratory.  This laboratory is certified under the Clinical Laboratory Improvement Amendments CLIA as qualified to perform high complexity clinical laboratory  testing.    MEROPENEM Value in next row Sensitive      <=4 SENSITIVEThis is a modified FDA-approved test that has been validated and its performance characteristics determined by the reporting laboratory.  This laboratory is certified under the Clinical Laboratory Improvement Amendments CLIA as qualified to perform high complexity clinical laboratory testing.    * >=100,000 COLONIES/mL PROTEUS PENNERI     Time coordinating discharge: Over 35 minutes  SIGNED:   Erle Odell Castor, MD  [facility] 12/05/2024, 11:30 AM Pager  If 7PM-7AM, please contact night-coverage [email or web address] Password [email or web address]     [1]  Allergies Allergen Reactions   Adhesive [Tape] Anaphylaxis and Swelling    *Adhesive Spray*

## 2024-12-06 ENCOUNTER — Telehealth: Payer: Self-pay

## 2024-12-06 NOTE — Transitions of Care (Post Inpatient/ED Visit) (Signed)
° °  12/06/2024  Name: Susan Warren MRN: [id number] DOB: 02-05-1976  Today's TOC FU Call Status: Today's TOC FU Call Status:: Successful TOC FU Call Completed TOC FU Call Complete Date: 12/06/24  Patient's Name and Date of Birth confirmed. Name, DOB  Transition Care Management Follow-up Telephone Call Date of Discharge: 12/05/24 Discharge Facility: [name]) Type of Discharge: Inpatient Admission Primary Inpatient Discharge Diagnosis:: syncope How have you been since you were released from the hospital?: Better Any questions or concerns?: No  Items Reviewed: Did you receive and understand the discharge instructions provided?: Yes Medications obtained,verified, and reconciled?: Yes (Medications Reviewed) Any new allergies since your discharge?: No Dietary orders reviewed?: Yes Do you have support at home?: Yes People in Home [RPT]: child(ren), adult  Medications Reviewed Today: Medications Reviewed Today     Reviewed by Emmitt Pan, LPN (Licensed Practical Nurse) on 12/06/24 at 1000  Med List Status: <None>   Medication Order Taking? Sig Documenting Provider Last Dose Status Informant  acetaminophen  (TYLENOL ) 325 MG tablet [id number]  Take 2 tablets (650 mg total) by mouth every 6 (six) hours as needed for mild pain (or Fever >/= 101).  Patient not taking: Reported on 12/06/2024   Elgergawy, Brayton RAMAN, MD  Active Self, Pharmacy Records  amoxicillin -clavulanate (AUGMENTIN ) 875-125 MG tablet [id number] Yes TAKE 1 TABLET BY MOUTH TWICE A DAY Snider, Cynthia, MD  Active Self, Pharmacy Records  cyanocobalamin  ([employer] VITAMIN B12) 1000 MCG tablet [id number]  Take 1 tablet (1,000 mcg total) by mouth daily.  Patient not taking: Reported on 12/06/2024   Brien Belvie BRAVO, MD  Active Self, Pharmacy Records  metFORMIN  (GLUCOPHAGE -XR) 500 MG 24 hr tablet [id number] Yes Take 1 tablet (500 mg total) by mouth daily with breakfast. Odell Celinda Balo, MD  Active   midodrine   (PROAMATINE ) 5 MG tablet [id number] Yes Take 1 tablet (5 mg total) by mouth 3 (three) times daily with meals. Krishnan, Gokul, MD  Active   XARELTO  20 MG TABS tablet [id number] Yes TAKE 1 TABLET BY MOUTH DAILY WITH SUPPER. Tanda Bleacher, MD  Active Self, Pharmacy Records            Home Care and Equipment/Supplies: Were Home Health Services Ordered?: NA Any new equipment or medical supplies ordered?: NA  Functional Questionnaire: Do you need assistance with bathing/showering or dressing?: No Do you need assistance with meal preparation?: No Do you need assistance with eating?: No Do you have difficulty maintaining continence: No Do you need assistance with getting out of bed/getting out of a chair/moving?: No Do you have difficulty managing or taking your medications?: No  Follow up appointments reviewed: PCP Follow-up appointment confirmed?: Yes Date of PCP follow-up appointment?: 12/20/24 Follow-up Provider: [facility] Follow-up appointment confirmed?: NA Do you need transportation to your follow-up appointment?: No Do you understand care options if your condition(s) worsen?: Yes-patient verbalized understanding    SIGNATURE Pan Emmitt, LPN [facility] Nurse Health Advisor Direct Dial [phone]

## 2024-12-06 NOTE — Telephone Encounter (Signed)
Keep all scheduled appointments 

## 2024-12-06 NOTE — Telephone Encounter (Signed)
 noted

## 2024-12-09 ENCOUNTER — Ambulatory Visit: Admitting: Internal Medicine

## 2024-12-10 ENCOUNTER — Inpatient Hospital Stay: Admission: RE | Admit: 2024-12-10 | Source: Ambulatory Visit

## 2024-12-20 ENCOUNTER — Encounter: Payer: Self-pay | Admitting: Family

## 2024-12-20 ENCOUNTER — Encounter: Payer: Self-pay | Admitting: Family Medicine

## 2024-12-20 ENCOUNTER — Ambulatory Visit: Payer: Self-pay | Admitting: Family

## 2024-12-20 VITALS — BP 138/78 | HR 117 | Temp 97.6°F | Resp 18 | Ht 64.0 in | Wt >= 6400 oz

## 2024-12-20 DIAGNOSIS — Z86718 Personal history of other venous thrombosis and embolism: Secondary | ICD-10-CM | POA: Diagnosis not present

## 2024-12-20 DIAGNOSIS — R55 Syncope and collapse: Secondary | ICD-10-CM | POA: Diagnosis not present

## 2024-12-20 DIAGNOSIS — Z09 Encounter for follow-up examination after completed treatment for conditions other than malignant neoplasm: Secondary | ICD-10-CM

## 2024-12-20 DIAGNOSIS — K7460 Unspecified cirrhosis of liver: Secondary | ICD-10-CM

## 2024-12-20 DIAGNOSIS — Z86711 Personal history of pulmonary embolism: Secondary | ICD-10-CM | POA: Diagnosis not present

## 2024-12-20 DIAGNOSIS — R160 Hepatomegaly, not elsewhere classified: Secondary | ICD-10-CM

## 2024-12-20 DIAGNOSIS — R3129 Other microscopic hematuria: Secondary | ICD-10-CM | POA: Diagnosis not present

## 2024-12-20 DIAGNOSIS — R8271 Bacteriuria: Secondary | ICD-10-CM | POA: Diagnosis not present

## 2024-12-20 DIAGNOSIS — Z0289 Encounter for other administrative examinations: Secondary | ICD-10-CM

## 2024-12-20 DIAGNOSIS — Z6841 Body Mass Index (BMI) 40.0 and over, adult: Secondary | ICD-10-CM | POA: Diagnosis not present

## 2024-12-20 DIAGNOSIS — D649 Anemia, unspecified: Secondary | ICD-10-CM | POA: Diagnosis not present

## 2024-12-20 DIAGNOSIS — K750 Abscess of liver: Secondary | ICD-10-CM

## 2024-12-20 DIAGNOSIS — I890 Lymphedema, not elsewhere classified: Secondary | ICD-10-CM

## 2024-12-20 DIAGNOSIS — R8281 Pyuria: Secondary | ICD-10-CM

## 2024-12-20 DIAGNOSIS — E1165 Type 2 diabetes mellitus with hyperglycemia: Secondary | ICD-10-CM

## 2024-12-20 DIAGNOSIS — Z9889 Other specified postprocedural states: Secondary | ICD-10-CM

## 2024-12-20 DIAGNOSIS — E872 Acidosis, unspecified: Secondary | ICD-10-CM

## 2024-12-20 NOTE — Progress Notes (Signed)
 "   Patient ID: Susan Warren, female    DOB: 02/25/1976  MRN: [id number]  CC: Hospital Discharge Follow-Up  Subjective: Susan Warren is a 48 y.o. female who presents for hospital discharge follow-up.   Her concerns today include:  Patient seen on 11/28/2024 - 12/05/2024 (7 days) at [facility]. Today patient reports feeling overall improved and denies red flag symptoms. She is doing well on medication regimen, no issues/concerns. States since hospital discharge she did not follow-up with Cardiology as instructed. States she called Cardiology and they told her to see Primary Care instead. States she needs form completed to return to work. Established with Gastroenterology.   Patient Active Problem List   Diagnosis Date Noted   Syncope 11/28/2024   Intra-abdominal abscess (HCC) 08/22/2024   Hepatic abscess 08/16/2024   Intraabdominal fluid collection 08/13/2024   RUQ abdominal pain 08/12/2024   Colon cancer screening 06/17/2022   Cervical cancer screening 06/17/2022   Herpes simplex labialis 04/21/2022   Vitamin B12 deficiency 04/19/2022   Normocytic anemia 04/18/2022   Right abdominal and pelvic cystic mass 04/16/2022   Supratherapeutic INR 04/16/2022   Morbid obesity with body mass index of 70 and over in adult [facility])    Leg DVT (deep venous thromboembolism), chronic, left (HCC) 04/12/2019   Hepatic cyst 04/12/2019   Lymphedema of both lower extremities    History of pulmonary embolism 03/29/2019   Preventative health care 01/25/2014   Hypertension    Depression    GERD (gastroesophageal reflux disease)    Allergy    Migraines    Anxiety    IUD (intrauterine device) in place 03/10/2013     Medications Ordered Prior to Encounter[1]  Allergies[2]  Social History   Socioeconomic History   Marital status: Widowed    Spouse name: Not on file   Number of children: Not on file   Years of education: 12   Highest education level: Associate degree: occupational,  scientist, product/process development, or vocational program  Occupational History   Occupation: Admin.    Employer: [employer]  Tobacco Use   Smoking status: Never   Smokeless tobacco: Never  Vaping Use   Vaping status: Never Used  Substance and Sexual Activity   Alcohol use: Yes    Comment: occ   Drug use: No   Sexual activity: Not Currently    Birth control/protection: I.U.D.  Other Topics Concern   Not on file  Social History Narrative   Married, 1 child   Social Drivers of Health   Tobacco Use: Low Risk (12/20/2024)   Patient History    Smoking Tobacco Use: Never    Smokeless Tobacco Use: Never    Passive Exposure: Not on file  Financial Resource Strain: Low Risk (12/11/2023)   Overall Financial Resource Strain (CARDIA)    Difficulty of Paying Living Expenses: Not hard at all  Food Insecurity: No Food Insecurity (11/29/2024)   Epic    Worried About Radiation Protection Practitioner of Food in the Last Year: Never true    Ran Out of Food in the Last Year: Never true  Transportation Needs: No Transportation Needs (11/29/2024)   Epic    Lack of Transportation (Medical): No    Lack of Transportation (Non-Medical): No  Physical Activity: Insufficiently Active (12/11/2023)   Exercise Vital Sign    Days of Exercise per Week: 2 days    Minutes of Exercise per Session: 10 min  Stress: No Stress Concern Present (12/11/2023)   [employer] of Occupational Health -  Occupational Stress Questionnaire    Feeling of Stress : Only a little  Social Connections: Unknown (12/11/2023)   Social Connection and Isolation Panel    Frequency of Communication with Friends and Family: More than three times a week    Frequency of Social Gatherings with Friends and Family: Once a week    Attends Religious Services: Patient declined    Database Administrator or Organizations: No    Attends Engineer, Structural: Not on file    Marital Status: Widowed  Intimate Partner Violence: Not At Risk (11/29/2024)   Epic    Fear of  Current or Ex-Partner: No    Emotionally Abused: No    Physically Abused: No    Sexually Abused: No  Depression (PHQ2-9): Medium Risk (12/20/2024)   Depression (PHQ2-9)    PHQ-2 Score: 6  Alcohol Screen: Low Risk (12/11/2023)   Alcohol Screen    Last Alcohol Screening Score (AUDIT): 1  Housing: Low Risk (11/29/2024)   Epic    Unable to Pay for Housing in the Last Year: No    Number of Times Moved in the Last Year: 0    Homeless in the Last Year: No  Utilities: Not At Risk (11/29/2024)   Epic    Threatened with loss of utilities: No  Health Literacy: Not on file    Family History  Problem Relation Age of Onset   Hypertension Mother    Thyroid disease Mother    Hypertension Maternal Grandmother    Diabetes Maternal Grandmother    Breast cancer Maternal Grandmother    Stroke Maternal Grandmother    Heart attack Neg Hx     Past Surgical History:  Procedure Laterality Date   CHOLECYSTECTOMY     IR INFUSION THROMBOL ARTERIAL INITIAL (MS)  03/29/2019   IR INFUSION THROMBOL ARTERIAL INITIAL (MS)  03/29/2019   IR PATIENT EVAL TECH 0-60 MINS  08/16/2024   IR RADIOLOGIST EVAL & MGMT  08/31/2024   IR RADIOLOGIST EVAL & MGMT  09/29/2024   IR THROMB F/U EVAL ART/VEN FINAL DAY (MS)  03/30/2019   IR US  GUIDE BX ASP/DRAIN  08/12/2024   IR US  GUIDE VASC ACCESS RIGHT  03/29/2019   TEE WITHOUT CARDIOVERSION N/A 04/23/2022   Procedure: TRANSESOPHAGEAL ECHOCARDIOGRAM (TEE);  Surgeon: Francyne Headland, MD;  Location: [facility] ENDOSCOPY;  Service: Cardiovascular;  Laterality: N/A;    ROS: Review of Systems Negative except as stated above  PHYSICAL EXAM: BP 138/78   Pulse (!) 117   Temp 97.6 F (36.4 C) (Oral)   Resp 18   Ht 5' 4 (1.626 m)   Wt (!) 463 lb 9.6 oz (210.3 kg)   SpO2 94%   BMI 79.58 kg/m   Physical Exam HENT:     Head: Normocephalic and atraumatic.     Nose: Nose normal.     Mouth/Throat:     Mouth: Mucous membranes are moist.     Pharynx: Oropharynx is clear.  Eyes:      Extraocular Movements: Extraocular movements intact.     Conjunctiva/sclera: Conjunctivae normal.     Pupils: Pupils are equal, round, and reactive to light.  Cardiovascular:     Rate and Rhythm: Tachycardia present.     Pulses: Normal pulses.     Heart sounds: Normal heart sounds.  Pulmonary:     Effort: Pulmonary effort is normal.     Breath sounds: Normal breath sounds.  Abdominal:     Comments: Drain in place clean, dry, intact.  Musculoskeletal:        General: Normal range of motion.     Cervical back: Normal range of motion and neck supple.     Comments: Lymphedema bilateral lower extremities.   Neurological:     General: No focal deficit present.     Mental Status: She is alert and oriented to person, place, and time.  Psychiatric:        Mood and Affect: Mood normal.        Behavior: Behavior normal.     ASSESSMENT AND PLAN: 1. Hospital discharge follow-up (Primary) - Reviewed hospital course, current medications, ensured proper follow-up in place, and addressed concerns.  - Routine screening.  - Basic Metabolic Panel - CBC  2. Syncope, unspecified syncope type - Referral to Cardiology for evaluation/management.  - Ambulatory referral to Cardiology  3. History of DVT (deep vein thrombosis) 4. History of pulmonary embolism - Continue present management.  - Follow-up with primary provider as scheduled.  5. Pyuria 6. Bacteriuria 7. Microscopic hematuria - Patient asymptomatic prior to hospital discharge.   8. Hepatic abscess 9. Status post incision and drainage 10. Hepatic cirrhosis, unspecified hepatic cirrhosis type, unspecified whether ascites present (HCC) 11. Hepatomegaly - Keep all scheduled appointments with established Gastroenterology.   12. Lactic acidosis - Resolved prior to hospital discharge.   13. Lymphedema of both lower extremities - Continue present management.  - Follow-up with primary provider as scheduled.   14. Normocytic  anemia - Routine screening (see #1).  15. Type 2 diabetes mellitus with hyperglycemia, without long-term current use of insulin  (HCC) - Hemoglobin A1c 6.8% on 11/28/2024. Goal 7%.  - Continue present management.  - Discussed the importance of healthy eating habits, low-carbohydrate diet, low-sugar diet, regular aerobic exercise (at least 150 minutes a week as tolerated) and medication compliance to achieve or maintain control of diabetes. - Follow-up with primary provider in 2 months or sooner if needed.   16. Hypomagnesemia - Resolved prior to hospital discharge.   17. Morbid obesity (HCC) - Noted at hospital discharge.  - Follow-up with primary provider as scheduled.  18. Encounter for completion of form with patient - I discussed with patient in detail I am unable to complete return to work form due to recommendation to receive medical clearance from Cardiology prior to returning to work (see #2). Patient verbalized understanding/agreement.  - Patient provided with a work letter to remain out of work until further notice. I also discussed with patient in detail when her primary provider Raguel Blush, MD returns to the office on next week she can discuss next steps in further details if needed. Patient verbalized understanding/agreement.    Patient was given the opportunity to ask questions.  Patient verbalized understanding of the plan and was able to repeat key elements of the plan. Patient was given clear instructions to go to Emergency Department or return to medical center if symptoms don't improve, worsen, or new problems develop.The patient verbalized understanding.   Orders Placed This Encounter  Procedures   Basic Metabolic Panel   CBC   Ambulatory referral to Cardiology   Return for Follow-Up or next available with Raguel Blush, MD.  Greig JINNY Chute, NP      [1]  Current Outpatient Medications on File Prior to Visit  Medication Sig Dispense Refill   acetaminophen   (TYLENOL ) 325 MG tablet Take 2 tablets (650 mg total) by mouth every 6 (six) hours as needed for mild pain (or Fever >/= 101).  amoxicillin -clavulanate (AUGMENTIN ) 875-125 MG tablet TAKE 1 TABLET BY MOUTH TWICE A DAY 60 tablet 0   cyanocobalamin  ([employer] VITAMIN B12) 1000 MCG tablet Take 1 tablet (1,000 mcg total) by mouth daily. 90 tablet 0   metFORMIN  (GLUCOPHAGE -XR) 500 MG 24 hr tablet Take 1 tablet (500 mg total) by mouth daily with breakfast. 30 tablet 0   midodrine  (PROAMATINE ) 5 MG tablet Take 1 tablet (5 mg total) by mouth 3 (three) times daily with meals. 90 tablet 1   XARELTO  20 MG TABS tablet TAKE 1 TABLET BY MOUTH DAILY WITH SUPPER. 30 tablet 0   No current facility-administered medications on file prior to visit.  [2]  Allergies Allergen Reactions   Adhesive [Tape] Anaphylaxis and Swelling    *Adhesive Spray*    "

## 2024-12-21 ENCOUNTER — Other Ambulatory Visit: Payer: Self-pay | Admitting: Family

## 2024-12-21 DIAGNOSIS — G4700 Insomnia, unspecified: Secondary | ICD-10-CM

## 2024-12-21 MED ORDER — TRAZODONE HCL 50 MG PO TABS: 25.0000 mg | ORAL_TABLET | Freq: Every evening | ORAL | 2 refills | Status: DC | PRN

## 2024-12-21 NOTE — Progress Notes (Unsigned)
" °  Cardiology Office Note   Date:  12/21/2024  ID:  Sheldon, Amara September 24, 1976, MRN [id number] PCP: Tanda Bleacher, MD  [facility] Providers Cardiologist:  None { Click to update primary MD,subspecialty MD or APP then REFRESH:1}    History of Present Illness Susan Warren is a 48 y.o. female with past medical history of GERD, history of DVT/PE on Xarelto , hypertension, lymphedema of bilateral lower extremities, OSA, anxiety, migraines, hepatic abscess s/p drain insertion.  Presents today for evaluation of syncope  Patient was admitted from 12/7 - 12/14.  Reported that she had been having intermittent episodes of near syncope over the past 2 months.  On the day of presentation, she had been on commode when she lost consciousness.  High-sensitivity troponin negative x 2.  Found to have a UTI and lactic acidosis with lactic acid 2.7>3.0>3.2>1.0.  Patient also found to have hepatic abscess.  Treated with antibiotics, IR consulted for drain placement.  Patient was also found to have possible liver cirrhosis and was instructed to follow-up with GI.  While admitted, patient's orthostatic vital signs were positive.  She was not able to wear compression socks due to significant lymphedema.  Started on low-dose midodrine  with improvement in her symptoms.  Echocardiogram on 11/29/2024 showed EF 70-75%, no wall motion abnormalities, grade 1 diastolic dysfunction, normal RV systolic function, no significant valvular abnormalities.  Syncope  - Patient had an episode of syncope while on the commode in the setting of lactic acidosis/dehydration, hepatic abscess.  Also found to have possible cirrhosis during that admission - During admission, echocardiogram showed EF 70-75%, no wall motion abnormalities, grade 1 diastolic dysfunction, normal RV systolic function, no significant valvular abnormalities - Orthostatic vital signs positive during recent admission.  Started on low-dose midodrine   with improvement in symptoms - Suspect  Hepatic abscess s/p incision and drainage, hepatic cirrhosis, hepatomegaly - Followed by GI  History of DVT History of PE - Continue Xarelto  20 mg daily    ROS: ***  Studies Reviewed      *** Risk Assessment/Calculations {Does this patient have ATRIAL FIBRILLATION?:[phone]}         Physical Exam VS:  There were no vitals taken for this visit.       Wt Readings from Last 3 Encounters:  12/20/24 (!) 463 lb 9.6 oz (210.3 kg)  11/29/24 (!) 440 lb (199.6 kg)  09/06/24 (!) 455 lb (206.4 kg)    GEN: Well nourished, well developed in no acute distress NECK: No JVD; No carotid bruits CARDIAC: ***RRR, no murmurs, rubs, gallops RESPIRATORY:  Clear to auscultation without rales, wheezing or rhonchi  ABDOMEN: Soft, non-tender, non-distended EXTREMITIES:  No edema; No deformity   ASSESSMENT AND PLAN ***    {Are you ordering a CV Procedure (e.g. stress test, cath, DCCV, TEE, etc)?   Press F2        :[id number]}  Dispo: ***  Signed, Rollo FABIENE Louder, PA-C   "

## 2024-12-21 NOTE — Telephone Encounter (Signed)
-   Work letter completed on 12/20/2024 3:00 PM.  - Trazodone prescribed (12/21/2024  8:53 AM EST).

## 2024-12-24 ENCOUNTER — Encounter: Payer: Self-pay | Admitting: Cardiology

## 2024-12-24 ENCOUNTER — Ambulatory Visit: Admitting: Cardiology

## 2024-12-24 VITALS — BP 120/82 | HR 124 | Ht 64.0 in | Wt >= 6400 oz

## 2024-12-24 DIAGNOSIS — I479 Paroxysmal tachycardia, unspecified: Secondary | ICD-10-CM

## 2024-12-24 DIAGNOSIS — R55 Syncope and collapse: Secondary | ICD-10-CM

## 2024-12-24 DIAGNOSIS — Z86711 Personal history of pulmonary embolism: Secondary | ICD-10-CM

## 2024-12-24 DIAGNOSIS — K750 Abscess of liver: Secondary | ICD-10-CM

## 2024-12-24 DIAGNOSIS — R000 Tachycardia, unspecified: Secondary | ICD-10-CM

## 2024-12-24 NOTE — Patient Instructions (Addendum)
 Medication Instructions:  Your physician recommends that you continue on your current medications as directed. Please refer to the Current Medication list given to you today.  *If you need a refill on your cardiac medications before your next appointment, please call your pharmacy*  Lab Work: CBC, CMP If you have labs (blood work) drawn today and your tests are completely normal, you will receive your results only by: MyChart Message (if you have MyChart) OR A paper copy in the mail If you have any lab test that is abnormal or we need to change your treatment, we will call you to review the results.  Testing/Procedures: 14 Day Zio heart monitor  Follow-Up: At [facility], you and your health needs are our priority.  As part of our continuing mission to provide you with exceptional heart care, our providers are all part of one team.  This team includes your primary Cardiologist (physician) and Advanced Practice Providers or APPs (Physician Assistants and Nurse Practitioners) who all work together to provide you with the care you need, when you need it.  Your next appointment:   4-6 week(s)  Provider:    Stanly Leavens, MD  We recommend signing up for the patient portal called MyChart.  Sign up information is provided on this After Visit Summary.  MyChart is used to connect with patients for Virtual Visits (Telemedicine).  Patients are able to view lab/test results, encounter notes, upcoming appointments, etc.  Non-urgent messages can be sent to your provider as well.   To learn more about what you can do with MyChart, go to [email or web address].   Other Instructions Your physician has requested that you regularly monitor and record your blood pressure readings at home. Please use the same machine at the same time of day to check your readings and record them and send them via MyChart.   Please monitor blood pressures and keep a log of your readings.    Make sure to  check 2 hours after your medications.    AVOID these things for 30 minutes before checking your blood pressure: No Drinking caffeine. No Drinking alcohol. No Eating. No Smoking. No Exercising.   Five minutes before checking your blood pressure: Pee. Sit in a dining chair. Avoid sitting in a soft couch or armchair. Be quiet. Do not talk ZIO XT- Long Term Monitor Instructions  Your physician has requested you wear a ZIO patch monitor for 14 days.  This is a single patch monitor. [employer] supplies one patch monitor per enrollment. Additional stickers are not available. Please do not apply patch if you will be having a Nuclear Stress Test,  Echocardiogram, Cardiac CT, MRI, or Chest Xray during the period you would be wearing the  monitor. The patch cannot be worn during these tests. You cannot remove and re-apply the  ZIO XT patch monitor.  Your ZIO patch monitor will be mailed 3 day USPS to your address on file. It may take 3-5 days  to receive your monitor after you have been enrolled.  Once you have received your monitor, please review the enclosed instructions. Your monitor  has already been registered assigning a specific monitor serial # to you.  Billing and Patient Assistance Program Information  We have supplied [employer] with any of your insurance information on file for billing purposes. [employer] offers a sliding scale Patient Assistance Program for patients that do not have  insurance, or whose insurance does not completely cover the cost of the ZIO monitor.  You must  apply for the Patient Assistance Program to qualify for this discounted rate.  To apply, please call [employer] at [phone], select option 4, select option 2, ask to apply for  Patient Assistance Program. Meredeth will ask your household income, and how many people  are in your household. They will quote your out-of-pocket cost based on that information.  [employer] will also be able to set up a 60-month,  interest-free payment plan if needed.  Applying the monitor   Shave hair from upper left chest.  Hold abrader disc by orange tab. Rub abrader in 40 strokes over the upper left chest as  indicated in your monitor instructions.  Clean area with 4 enclosed alcohol pads. Let dry.  Apply patch as indicated in monitor instructions. Patch will be placed under collarbone on left  side of chest with arrow pointing upward.  Rub patch adhesive wings for 2 minutes. Remove white label marked 1. Remove the white  label marked 2. Rub patch adhesive wings for 2 additional minutes.  While looking in a mirror, press and release button in center of patch. A small green light will  flash 3-4 times. This will be your only indicator that the monitor has been turned on.  Do not shower for the first 24 hours. You may shower after the first 24 hours.  Press the button if you feel a symptom. You will hear a small click. Record Date, Time and  Symptom in the Patient Logbook.  When you are ready to remove the patch, follow instructions on the last 2 pages of Patient  Logbook. Stick patch monitor onto the last page of Patient Logbook.  Place Patient Logbook in the blue and white box. Use locking tab on box and tape box closed  securely. The blue and white box has prepaid postage on it. Please place it in the mailbox as  soon as possible. Your physician should have your test results approximately 7 days after the  monitor has been mailed back to [facility].  Call [facility] Customer Care at [phone] if you have questions regarding  your ZIO XT patch monitor. Call them immediately if you see an orange light blinking on your  monitor.  If your monitor falls off in less than 4 days, contact our Monitor department at [phone].  If your monitor becomes loose or falls off after 4 days call [employer] at [phone] for  suggestions on securing your monitor

## 2024-12-27 ENCOUNTER — Telehealth: Payer: Self-pay

## 2024-12-27 ENCOUNTER — Ambulatory Visit

## 2024-12-27 DIAGNOSIS — R55 Syncope and collapse: Secondary | ICD-10-CM

## 2024-12-27 MED ORDER — MIDODRINE HCL 5 MG PO TABS: 5.0000 mg | ORAL_TABLET | Freq: Three times a day (TID) | ORAL | 1 refills | Status: AC

## 2024-12-27 NOTE — Progress Notes (Unsigned)
Enrolled for [employer] to mail a ZIO XT long term holter monitor to the patients address on file.   Dr. Chandrasekhar to read. 

## 2024-12-27 NOTE — Telephone Encounter (Signed)
 Noted

## 2024-12-27 NOTE — Telephone Encounter (Signed)
 Midodrine  refilled per LOIS Louder, PA

## 2024-12-28 DIAGNOSIS — Z0279 Encounter for issue of other medical certificate: Secondary | ICD-10-CM

## 2024-12-29 ENCOUNTER — Other Ambulatory Visit

## 2024-12-29 ENCOUNTER — Inpatient Hospital Stay: Admission: RE | Admit: 2024-12-29 | Source: Ambulatory Visit

## 2024-12-31 ENCOUNTER — Other Ambulatory Visit: Payer: Self-pay | Admitting: Internal Medicine

## 2024-12-31 ENCOUNTER — Telehealth: Payer: Self-pay | Admitting: Cardiology

## 2024-12-31 NOTE — Telephone Encounter (Signed)
 Completed [employer] for Duty form was given to patient and scanned into chart.  Billing notified.

## 2024-12-31 NOTE — Telephone Encounter (Signed)
 Appt 1/12

## 2025-01-02 ENCOUNTER — Encounter: Payer: Self-pay | Admitting: Family Medicine

## 2025-01-03 ENCOUNTER — Ambulatory Visit
Admission: RE | Admit: 2025-01-03 | Discharge: 2025-01-03 | Disposition: A | Source: Ambulatory Visit | Attending: Internal Medicine

## 2025-01-03 ENCOUNTER — Ambulatory Visit: Payer: Self-pay | Admitting: Internal Medicine

## 2025-01-03 ENCOUNTER — Other Ambulatory Visit: Payer: Self-pay

## 2025-01-03 ENCOUNTER — Encounter: Payer: Self-pay | Admitting: Internal Medicine

## 2025-01-03 ENCOUNTER — Other Ambulatory Visit: Payer: Self-pay | Admitting: Family Medicine

## 2025-01-03 VITALS — BP 135/81 | HR 117 | Temp 97.5°F

## 2025-01-03 DIAGNOSIS — K750 Abscess of liver: Secondary | ICD-10-CM | POA: Diagnosis not present

## 2025-01-03 DIAGNOSIS — K7689 Other specified diseases of liver: Secondary | ICD-10-CM

## 2025-01-03 HISTORY — PX: IR RADIOLOGIST EVAL & MGMT: IMG5224

## 2025-01-03 MED ORDER — METFORMIN HCL ER 500 MG PO TB24: 500.0000 mg | ORAL_TABLET | Freq: Every day | ORAL | 0 refills | Status: AC

## 2025-01-03 MED ORDER — IOPAMIDOL (ISOVUE-300) INJECTION 61%
100.0000 mL | Freq: Once | INTRAVENOUS | Status: AC | PRN
  Administered 2025-01-03: 100 mL via INTRAVENOUS

## 2025-01-03 NOTE — Progress Notes (Signed)
 "      Patient ID: Susan Warren, female   DOB: 1976-11-28, 49 y.o.   MRN: [id number]  HPI Susan Warren is a 39you F with hepatic abscess who has been on 4.5 months of abtx now since initially drained Now only draining 25 mL. But flushes 10mL per day Looks like it is serous in the bulb. She has upcoming IR appointment for removal of drain  Outpatient Encounter Medications as of 01/03/2025  Medication Sig   acetaminophen  (TYLENOL ) 325 MG tablet Take 2 tablets (650 mg total) by mouth every 6 (six) hours as needed for mild pain (or Fever >/= 101).   amoxicillin -clavulanate (AUGMENTIN ) 875-125 MG tablet TAKE 1 TABLET BY MOUTH TWICE A DAY   cyanocobalamin  ([employer] VITAMIN B12) 1000 MCG tablet Take 1 tablet (1,000 mcg total) by mouth daily.   metFORMIN  (GLUCOPHAGE -XR) 500 MG 24 hr tablet Take 1 tablet (500 mg total) by mouth daily with breakfast.   midodrine  (PROAMATINE ) 5 MG tablet Take 1 tablet (5 mg total) by mouth 3 (three) times daily with meals.   traZODone  (DESYREL ) 50 MG tablet Take 0.5-1 tablets (25-50 mg total) by mouth at bedtime as needed for sleep.   XARELTO  20 MG TABS tablet TAKE 1 TABLET BY MOUTH DAILY WITH SUPPER.   [DISCONTINUED] metFORMIN  (GLUCOPHAGE -XR) 500 MG 24 hr tablet Take 1 tablet (500 mg total) by mouth daily with breakfast.   No facility-administered encounter medications on file as of 01/03/2025.     Patient Active Problem List   Diagnosis Date Noted   Syncope 11/28/2024   Intra-abdominal abscess (HCC) 08/22/2024   Hepatic abscess 08/16/2024   Intraabdominal fluid collection 08/13/2024   RUQ abdominal pain 08/12/2024   Colon cancer screening 06/17/2022   Cervical cancer screening 06/17/2022   Herpes simplex labialis 04/21/2022   Vitamin B12 deficiency 04/19/2022   Normocytic anemia 04/18/2022   Right abdominal and pelvic cystic mass 04/16/2022   Supratherapeutic INR 04/16/2022   Morbid obesity with body mass index of 70 and over in adult [facility])    Leg DVT (deep  venous thromboembolism), chronic, left (HCC) 04/12/2019   Hepatic cyst 04/12/2019   Lymphedema of both lower extremities    History of pulmonary embolism 03/29/2019   Preventative health care 01/25/2014   Hypertension    Depression    GERD (gastroesophageal reflux disease)    Allergy    Migraines    Anxiety    IUD (intrauterine device) in place 03/10/2013     Health Maintenance Due  Topic Date Due   Diabetic kidney evaluation - Urine ACR  Never done   Pneumococcal Vaccine: 50+ Years (1 of 2 - PCV) Never done   Hepatitis B Vaccines 19-59 Average Risk (1 of 3 - 19+ 3-dose series) Never done   Zoster Vaccines- Shingrix (1 of 2) Never done   Mammogram  Never done   Cervical Cancer Screening (HPV/Pap Cotest)  11/24/2015   Fecal DNA (Cologuard)  Never done   COVID-19 Vaccine (3 - Pfizer risk series) 07/03/2020   DTaP/Tdap/Td (2 - Td or Tdap) 11/23/2022   Influenza Vaccine  Never done     Review of Systems 12 point ros is otherwise negative Physical Exam   BP 135/81   Pulse (!) 117   Temp (!) 97.5 F (36.4 C) (Oral)   SpO2 98%   Physical Exam  Constitutional:  oriented to person, place, and time. appears well-developed and well-nourished. No distress.  HENT: [place]/AT, PERRLA, no scleral icterus Mouth/Throat: Oropharynx is  clear and moist. No oropharyngeal exudate.  Cardiovascular: Normal rate, regular rhythm and normal heart sounds. Exam reveals no gallop and no friction rub.  No murmur heard.  Pulmonary/Chest: Effort normal and breath sounds normal. No respiratory distress.  has no wheezes.  Abd= nontender Neurological: alert and oriented to person, place, and time.  Skin: Skin is warm and dry. No rash noted. No erythema.  Psychiatric: a normal mood and affect.  behavior is normal.    CBC Lab Results  Component Value Date   WBC 7.6 12/03/2024   RBC 3.54 (L) 12/03/2024   HGB 9.2 (L) 12/03/2024   HCT 30.3 (L) 12/03/2024   PLT 415 (H) 12/03/2024   MCV 85.6 12/03/2024    MCH 26.0 12/03/2024   MCHC 30.4 12/03/2024   RDW 15.6 (H) 12/03/2024   LYMPHSABS 1.5 11/28/2024   MONOABS 0.7 11/28/2024   EOSABS 0.1 11/28/2024    BMET Lab Results  Component Value Date   NA 136 12/03/2024   K 4.5 12/03/2024   CL 103 12/03/2024   CO2 22 12/03/2024   GLUCOSE 183 (H) 12/03/2024   BUN 19 12/03/2024   CREATININE 0.89 12/03/2024   CALCIUM  9.1 12/03/2024   GFRNONAA >60 12/03/2024   GFRAA 115 04/15/2019   Klebsiella pneumoniae      MIC    AMPICILLIN  RESISTANT Resistant    AMPICILLIN /SULBACTAM <=2 SENSITIVE Sensitive    CEFAZOLIN  (NON-URINE) <=1 SENSITIVE Sensitive    CEFEPIME <=0.12 SENS... Sensitive    CEFTRIAXONE  <=0.25 SENS... Sensitive    CIPROFLOXACIN <=0.06 SENS... Sensitive    ERTAPENEM <=0.12 SENS... Sensitive    GENTAMICIN <=1 SENSITIVE Sensitive    MEROPENEM <=0.25 SENS... Sensitive    PIP/TAZO  Sensitive 1    TRIMETH/SULFA <=20 SENSIT... Sensitive      Assessment and Plan Continue amox/clav -but needs refill Trying to determine length of abtx course- anticipate to still need for additional 2 months  Has imaging today to see size of hepatic abscess Long term medicaiton management = will check sed rate , crp, cbc, and cmp =Labs today Being evaluated for scleral therapy    "

## 2025-01-03 NOTE — Progress Notes (Addendum)
 "      Chief Complaint: Patient was seen in consultation today for No chief complaint on file.  at the request of Vu,Trung T  Referring Physician(s): Vu,Trung T  History of Present Illness: Susan Warren is a 49 y.o. female with a history of large, complex right abdominal cyst.   2023: Cyst aspiration by outside facility, no records. Patient is certain no drain associated with this encounter. 08/13/24: IR aspiration only with Dr. Vanice (5L removed).  08/16/24: culture +klebsiella pneumoniae; Dr. Vanice subsequently placed a drain into the collection.  Drain initially put out copious output with plans for possible sclerotherapy complicated by infection of the cyst.  At most recent clinic visit on 11/17/2024, plan was made for patient to follow-up with infectious disease (she was still within course of antibiotics at that time) and for IR to potentially pull the drain at the next pleasant clinic visit in late December.   This plan was complicated by interim hospitalization for syncopal episode.  She was seen while IP by IR. An updated CT was obtained by the inpatient team which reads as being a multiloculated, partially calcified chronic 13 x 7 cm subhepatic collection.  Dr. Johann also reviewed the imaging and notes little to no true fluid collection with this area primarily being shell reflecting chronicity of the cyst.   Drain offers little additional benefit per IR MD evaluation while IP, but pulling the drain was held until ID felt infectious aspect of this was covered adequately (discharged with plan for 3 more wks abx). We suspect it will recur, but Dr. Jenna wanted to hold sclerotherapy for if cyst recurs and is not infected per last clinic note. She returns to clinic today for repeat CT and to discuss plan now that she has completed course of abx. She did see ID (Dr. Luiz) who agrees with plan to pull drain if appropriate based on CT.   She denies abd pain, fever, chills, N/V.  Endorses dizziness but is being treated as separate concern by cardiology. The external suture was accidentally removed by patient along with stat lock previously. No concerns with insertion site leakage or breakdown per patient. She tells me output has been <25 mL daily and clear for last 2 weeks. Continues to flush daily and use bulb suction.   Past Medical History:  Diagnosis Date   Abdominal pain due to right abdominal and pelvic cystic mass 04/16/2022   Allergy    Anxiety    Candidemia (HCC) 04/18/2022   Depression    DVT (deep venous thrombosis) (HCC)    GERD (gastroesophageal reflux disease)    Hepatic cyst    Hypertension    Lymphedema of both lower extremities    Migraines    Morbid obesity with BMI of 70 and over, adult [facility])    PE (pulmonary thromboembolism) (HCC) 03/2019   Pneumonia 04/09/2022   Sleep apnea     Past Surgical History:  Procedure Laterality Date   CHOLECYSTECTOMY     IR INFUSION THROMBOL ARTERIAL INITIAL (MS)  03/29/2019   IR INFUSION THROMBOL ARTERIAL INITIAL (MS)  03/29/2019   IR PATIENT EVAL TECH 0-60 MINS  08/16/2024   IR RADIOLOGIST EVAL & MGMT  08/31/2024   IR RADIOLOGIST EVAL & MGMT  09/29/2024   IR THROMB F/U EVAL ART/VEN FINAL DAY (MS)  03/30/2019   IR US  GUIDE BX ASP/DRAIN  08/12/2024   IR US  GUIDE VASC ACCESS RIGHT  03/29/2019   TEE WITHOUT CARDIOVERSION N/A 04/23/2022  Procedure: TRANSESOPHAGEAL ECHOCARDIOGRAM (TEE);  Surgeon: Francyne Headland, MD;  Location: [facility] ENDOSCOPY;  Service: Cardiovascular;  Laterality: N/A;    Allergies: Adhesive [tape]  Medications: Prior to Admission medications  Medication Sig Start Date End Date Taking? Authorizing Provider  acetaminophen  (TYLENOL ) 325 MG tablet Take 2 tablets (650 mg total) by mouth every 6 (six) hours as needed for mild pain (or Fever >/= 101). 04/11/22   Elgergawy, Brayton RAMAN, MD  amoxicillin -clavulanate (AUGMENTIN ) 875-125 MG tablet TAKE 1 TABLET BY MOUTH TWICE A DAY 11/09/24   Luiz Channel, MD   cyanocobalamin  (CVS VITAMIN B12) 1000 MCG tablet Take 1 tablet (1,000 mcg total) by mouth daily. 01/28/23   Brien Belvie BRAVO, MD  metFORMIN  (GLUCOPHAGE -XR) 500 MG 24 hr tablet Take 1 tablet (500 mg total) by mouth daily with breakfast. 01/03/25   Tanda Bleacher, MD  midodrine  (PROAMATINE ) 5 MG tablet Take 1 tablet (5 mg total) by mouth 3 (three) times daily with meals. 12/27/24   Vicci Rollo SAUNDERS, PA-C  traZODone  (DESYREL ) 50 MG tablet Take 0.5-1 tablets (25-50 mg total) by mouth at bedtime as needed for sleep. 12/21/24   Jaycee Greig PARAS, NP  XARELTO  20 MG TABS tablet TAKE 1 TABLET BY MOUTH DAILY WITH SUPPER. 12/08/24   Tanda Bleacher, MD     Family History  Problem Relation Age of Onset   Hypertension Mother    Thyroid disease Mother    Hypertension Maternal Grandmother    Diabetes Maternal Grandmother    Breast cancer Maternal Grandmother    Stroke Maternal Grandmother    Heart attack Neg Hx     Social History   Socioeconomic History   Marital status: Widowed    Spouse name: Not on file   Number of children: Not on file   Years of education: 12   Highest education level: Associate degree: occupational, scientist, product/process development, or vocational program  Occupational History   Occupation: Admin.    Employer: [employer]  Tobacco Use   Smoking status: Never   Smokeless tobacco: Never  Vaping Use   Vaping status: Never Used  Substance and Sexual Activity   Alcohol use: Yes    Comment: occ   Drug use: No   Sexual activity: Not Currently    Birth control/protection: I.U.D.  Other Topics Concern   Not on file  Social History Narrative   Married, 1 child   Social Drivers of Health   Tobacco Use: Low Risk (12/24/2024)   Patient History    Smoking Tobacco Use: Never    Smokeless Tobacco Use: Never    Passive Exposure: Not on file  Financial Resource Strain: Low Risk (12/11/2023)   Overall Financial Resource Strain (CARDIA)    Difficulty of Paying Living Expenses: Not hard at all   Food Insecurity: No Food Insecurity (11/29/2024)   Epic    Worried About Radiation Protection Practitioner of Food in the Last Year: Never true    Ran Out of Food in the Last Year: Never true  Transportation Needs: No Transportation Needs (11/29/2024)   Epic    Lack of Transportation (Medical): No    Lack of Transportation (Non-Medical): No  Physical Activity: Insufficiently Active (12/11/2023)   Exercise Vital Sign    Days of Exercise per Week: 2 days    Minutes of Exercise per Session: 10 min  Stress: No Stress Concern Present (12/11/2023)   [employer] of Occupational Health - Occupational Stress Questionnaire    Feeling of Stress : Only a little  Social Connections: Unknown (  12/11/2023)   Social Connection and Isolation Panel    Frequency of Communication with Friends and Family: More than three times a week    Frequency of Social Gatherings with Friends and Family: Once a week    Attends Religious Services: Patient declined    Database Administrator or Organizations: No    Attends Engineer, Structural: Not on file    Marital Status: Widowed  Depression (PHQ2-9): Medium Risk (12/20/2024)   Depression (PHQ2-9)    PHQ-2 Score: 6  Alcohol Screen: Low Risk (12/11/2023)   Alcohol Screen    Last Alcohol Screening Score (AUDIT): 1  Housing: Low Risk (11/29/2024)   Epic    Unable to Pay for Housing in the Last Year: No    Number of Times Moved in the Last Year: 0    Homeless in the Last Year: No  Utilities: Not At Risk (11/29/2024)   Epic    Threatened with loss of utilities: No  Health Literacy: Not on file     Review of Systems: A 12 point ROS discussed and pertinent positives are indicated in the HPI above.  All other systems are negative.   Vital Signs: There were no vitals taken for this visit.    Physical Exam Constitutional:      General: She is not in acute distress.    Appearance: She is obese.     Comments: Using wheelchair for mobility d/t knee pain and dizziness   Pulmonary:     Effort: Pulmonary effort is normal.  Abdominal:     General: There is no distension.     Palpations: Abdomen is soft.     Comments: Drain present R abd, mild skin darkening without breakdown around site of chronic drain. No aspirate achieved. Minimal crusting present around drain. Nontender locally. No ext suture or stat lock present. Scant serous op in bulb.   Skin:    General: Skin is warm and dry.     Coloration: Skin is not jaundiced.  Neurological:     Mental Status: She is alert and oriented to person, place, and time.  Psychiatric:        Mood and Affect: Mood normal.        Behavior: Behavior normal.        Thought Content: Thought content normal.        Judgment: Judgment normal.    Imaging: No results found.  Labs:  CBC: Recent Labs    11/29/24 0109 11/30/24 0407 12/01/24 0419 12/03/24 0356  WBC 13.1* 8.0 8.1 7.6  HGB 10.2* 9.2* 9.2* 9.2*  HCT 34.0* 30.4* 31.2* 30.3*  PLT 415* 373 373 415*    COAGS: Recent Labs    08/15/24 0334 08/16/24 0353 08/17/24 0411 11/29/24 0109  INR 3.0* 2.8* 3.2* 1.8*    BMP: Recent Labs    11/29/24 0109 11/30/24 0407 12/01/24 0419 12/03/24 0356  NA 137 135 136 136  K 3.6 4.0 4.0 4.5  CL 105 102 103 103  CO2 19* 21* 21* 22  GLUCOSE 176* 151* 185* 183*  BUN 17 21* 18 19  CALCIUM  8.0* 9.4 8.9 9.1  CREATININE 0.93 0.74 0.84 0.89  GFRNONAA >60 >60 >60 >60    LIVER FUNCTION TESTS: Recent Labs    08/22/24 0623 11/28/24 1817 11/29/24 0109 11/30/24 0407 12/03/24 0356  BILITOT 0.4 0.9 0.7  --  0.4  AST 17 19 21   --  22  ALT 14 11 12   --  6  ALKPHOS 91 63 71  --  74  PROT 7.4 6.6 7.0  --  7.4  ALBUMIN 1.9* 2.4* 2.7* 3.3* 3.3*    TUMOR MARKERS: No results for input(s): AFPTM, CEA, CA199, CHROMGRNA in the last 8760 hours.  Assessment and Plan:  S/p R abd complex cyst drain placement 08/16/24 with previous positive cultures (kleb).   Ms. Cordell has completed abx and now returns for CT  and IR f/u. CT reviewed by Dr. Luverne, shows collapsed collection. No indication for drain injection today. Planning to pull drain today which is supported by ID (was able to reach Dr. Luiz to touch base).   Previously while inpatient and while at clinic, patient was primarily seen by Dr. Jenna. Spoke with him to touch base about what could be offered to patient in the future if drain pulled today. Drain should be pulled prior to consideration for sclerotherapy. While recollection is expected, will give a trial period of 3-4 months and then repeat CT at clinic. (Patient will call clinic sooner if she develops sx sooner.) If recurrence, Dr. Jenna would consider sclerotherapy via new puncture. If not successful, patient would likely need surgical consult.   Discussed potential for fluid recollection, aftercare instructions post-removal, and red flag sx. Patient and patient family agrees with plan to remove TG drain today (insertion region cleansed, drain cut releasing internal suture, drain removed along with internal suture in its entirety, well tolerated, dressed with clean gauze).   Thank you for this interesting consult.  I greatly enjoyed meeting Susan Warren and look forward to participating in their care.  A copy of this report was sent to the requesting provider on this date.  Electronically Signed: Madison Direnzo 01/03/2025, 8:53 AM   I spent a total of   25 Minutes in face to face in clinical consultation, greater than 50% of which was counseling/coordinating care for chronic R abd complex cyst drain.   "

## 2025-01-04 LAB — CBC WITH DIFFERENTIAL/PLATELET
Absolute Lymphocytes: 2307 {cells}/uL (ref 850–3900)
Absolute Monocytes: 670 {cells}/uL (ref 200–950)
Basophils Absolute: 52 {cells}/uL (ref 0–200)
Basophils Relative: 0.5 %
Eosinophils Absolute: 185 {cells}/uL (ref 15–500)
Eosinophils Relative: 1.8 %
HCT: 35.2 % — ABNORMAL LOW (ref 35.9–46.0)
Hemoglobin: 10.6 g/dL — ABNORMAL LOW (ref 11.7–15.5)
MCH: 23.7 pg — ABNORMAL LOW (ref 27.0–33.0)
MCHC: 30.1 g/dL — ABNORMAL LOW (ref 31.6–35.4)
MCV: 78.7 fL — ABNORMAL LOW (ref 81.4–101.7)
MPV: 10.3 fL (ref 7.5–12.5)
Monocytes Relative: 6.5 %
Neutro Abs: 7086 {cells}/uL (ref 1500–7800)
Neutrophils Relative %: 68.8 %
Platelets: 557 Thousand/uL — ABNORMAL HIGH (ref 140–400)
RBC: 4.47 Million/uL (ref 3.80–5.10)
RDW: 14.9 % (ref 11.0–15.0)
Total Lymphocyte: 22.4 %
WBC: 10.3 Thousand/uL (ref 3.8–10.8)

## 2025-01-04 LAB — BASIC METABOLIC PANEL WITH GFR
BUN: 18 mg/dL (ref 7–25)
CO2: 23 mmol/L (ref 20–32)
Calcium: 9.3 mg/dL (ref 8.6–10.4)
Chloride: 100 mmol/L (ref 98–110)
Creat: 0.81 mg/dL (ref 0.50–1.03)
Glucose, Bld: 264 mg/dL — ABNORMAL HIGH (ref 65–99)
Potassium: 4 mmol/L (ref 3.5–5.3)
Sodium: 136 mmol/L (ref 135–146)
eGFR: 88 mL/min/1.73m2

## 2025-01-04 LAB — SEDIMENTATION RATE: Sed Rate: 108 mm/h — ABNORMAL HIGH (ref 0–20)

## 2025-01-04 LAB — C-REACTIVE PROTEIN: CRP: 39.8 mg/L — ABNORMAL HIGH

## 2025-01-04 NOTE — Telephone Encounter (Signed)
 Message sent to provider.   Keric Zehren, BSN, RN

## 2025-01-04 NOTE — Telephone Encounter (Signed)
 Okay to refill 30 days with 1 refill per Dr. Luiz.   Ryleah Miramontes, BSN, RN

## 2025-01-10 ENCOUNTER — Other Ambulatory Visit: Payer: Self-pay | Admitting: Family Medicine

## 2025-01-11 ENCOUNTER — Telehealth: Payer: Self-pay | Admitting: Family Medicine

## 2025-01-11 ENCOUNTER — Other Ambulatory Visit: Payer: Self-pay | Admitting: Family Medicine

## 2025-01-11 MED ORDER — RIVAROXABAN 20 MG PO TABS: 20.0000 mg | ORAL_TABLET | Freq: Every day | ORAL | 0 refills | Status: AC

## 2025-01-11 NOTE — Telephone Encounter (Signed)
 Patient called [facility] stating she need a refill for XARELTO  20 MG TABS tablet. She has an appt schedule for 01/25/25. Patient also stated she put in a refill request on mychart.

## 2025-01-11 NOTE — Telephone Encounter (Signed)
 Med has been refilled.

## 2025-01-11 NOTE — Telephone Encounter (Signed)
 Patient dropped off document [employer]: Fitness for duty certification for pt's [employer] department, to be filled out and signed by provider. Pt has appt scheduled on 02/03 to follow up with Dr. Tanda, per last visit. Pt saw Amy Massey,NP on 12/29 for a HFU.  Document is located in providers tray at front office. Please advise at Mobile [phone] (mobile)   Called pt to let her know paperwork will be filled/signed on day of appt scheduled; could not reach but left a vm to call us  back for details.

## 2025-01-12 ENCOUNTER — Other Ambulatory Visit: Payer: Self-pay | Admitting: Family

## 2025-01-12 DIAGNOSIS — G4700 Insomnia, unspecified: Secondary | ICD-10-CM

## 2025-01-12 NOTE — Telephone Encounter (Signed)
 Complete. Please send future refills to patient's primary provider Raguel Blush, MD.

## 2025-01-17 ENCOUNTER — Encounter: Payer: Self-pay | Admitting: Family Medicine

## 2025-01-25 ENCOUNTER — Ambulatory Visit: Admitting: Family Medicine

## 2025-01-26 ENCOUNTER — Encounter: Payer: Self-pay | Admitting: Family Medicine

## 2025-01-31 ENCOUNTER — Ambulatory Visit: Admitting: Physician Assistant

## 2025-02-07 ENCOUNTER — Ambulatory Visit: Payer: Self-pay | Admitting: Family Medicine

## 2025-02-16 ENCOUNTER — Ambulatory Visit: Payer: Self-pay | Admitting: Internal Medicine

## 2025-03-08 ENCOUNTER — Ambulatory Visit: Admitting: Family Medicine

## 2025-03-30 ENCOUNTER — Ambulatory Visit: Admitting: Physician Assistant
